# Patient Record
Sex: Female | Born: 1940 | Race: White | Hispanic: No | Marital: Married | Smoking: Never smoker
Health system: Southern US, Community
[De-identification: ages and names within clinical notes are randomized; demographics above are authoritative.]

## PROBLEM LIST (undated history)

## (undated) DIAGNOSIS — M1990 Unspecified osteoarthritis, unspecified site: Secondary | ICD-10-CM

## (undated) DIAGNOSIS — M797 Fibromyalgia: Secondary | ICD-10-CM

## (undated) DIAGNOSIS — Z923 Personal history of irradiation: Secondary | ICD-10-CM

## (undated) DIAGNOSIS — K589 Irritable bowel syndrome without diarrhea: Secondary | ICD-10-CM

## (undated) DIAGNOSIS — Z973 Presence of spectacles and contact lenses: Secondary | ICD-10-CM

## (undated) DIAGNOSIS — F419 Anxiety disorder, unspecified: Secondary | ICD-10-CM

## (undated) DIAGNOSIS — I82409 Acute embolism and thrombosis of unspecified deep veins of unspecified lower extremity: Secondary | ICD-10-CM

## (undated) DIAGNOSIS — C50411 Malignant neoplasm of upper-outer quadrant of right female breast: Secondary | ICD-10-CM

## (undated) DIAGNOSIS — I10 Essential (primary) hypertension: Secondary | ICD-10-CM

## (undated) DIAGNOSIS — E785 Hyperlipidemia, unspecified: Secondary | ICD-10-CM

## (undated) DIAGNOSIS — C50919 Malignant neoplasm of unspecified site of unspecified female breast: Secondary | ICD-10-CM

## (undated) DIAGNOSIS — M8580 Other specified disorders of bone density and structure, unspecified site: Secondary | ICD-10-CM

## (undated) HISTORY — DX: Irritable bowel syndrome, unspecified: K58.9

## (undated) HISTORY — PX: COLONOSCOPY: SHX174

## (undated) HISTORY — DX: Essential (primary) hypertension: I10

## (undated) HISTORY — DX: Malignant neoplasm of upper-outer quadrant of right female breast: C50.411

## (undated) HISTORY — PX: APPENDECTOMY: SHX54

## (undated) HISTORY — DX: Anxiety disorder, unspecified: F41.9

## (undated) HISTORY — PX: BREAST EXCISIONAL BIOPSY: SUR124

## (undated) HISTORY — DX: Other specified disorders of bone density and structure, unspecified site: M85.80

## (undated) HISTORY — DX: Fibromyalgia: M79.7

## (undated) HISTORY — PX: CATARACT EXTRACTION: SUR2

## (undated) HISTORY — DX: Unspecified osteoarthritis, unspecified site: M19.90

## (undated) HISTORY — DX: Hyperlipidemia, unspecified: E78.5

## (undated) HISTORY — DX: Acute embolism and thrombosis of unspecified deep veins of unspecified lower extremity: I82.409

---

## 1970-03-29 HISTORY — PX: ABDOMINAL HYSTERECTOMY: SHX81

## 1983-03-30 HISTORY — PX: OTHER SURGICAL HISTORY: SHX169

## 1993-03-29 HISTORY — PX: BREAST SURGERY: SHX581

## 1997-11-01 ENCOUNTER — Other Ambulatory Visit: Admission: RE | Admit: 1997-11-01 | Discharge: 1997-11-01 | Payer: Self-pay | Admitting: Obstetrics and Gynecology

## 1998-11-07 ENCOUNTER — Other Ambulatory Visit: Admission: RE | Admit: 1998-11-07 | Discharge: 1998-11-07 | Payer: Self-pay | Admitting: Obstetrics and Gynecology

## 1999-08-18 ENCOUNTER — Encounter: Admission: RE | Admit: 1999-08-18 | Discharge: 1999-08-18 | Payer: Self-pay | Admitting: General Surgery

## 1999-08-18 ENCOUNTER — Encounter: Payer: Self-pay | Admitting: General Surgery

## 1999-12-28 ENCOUNTER — Other Ambulatory Visit: Admission: RE | Admit: 1999-12-28 | Discharge: 1999-12-28 | Payer: Self-pay | Admitting: Family Medicine

## 2000-08-23 ENCOUNTER — Encounter: Admission: RE | Admit: 2000-08-23 | Discharge: 2000-08-23 | Payer: Self-pay | Admitting: General Surgery

## 2000-08-23 ENCOUNTER — Encounter: Payer: Self-pay | Admitting: General Surgery

## 2001-10-17 ENCOUNTER — Encounter: Payer: Self-pay | Admitting: Family Medicine

## 2001-10-17 ENCOUNTER — Encounter: Admission: RE | Admit: 2001-10-17 | Discharge: 2001-10-17 | Payer: Self-pay | Admitting: Family Medicine

## 2002-03-12 ENCOUNTER — Encounter: Admission: RE | Admit: 2002-03-12 | Discharge: 2002-03-12 | Payer: Self-pay | Admitting: Family Medicine

## 2002-03-12 ENCOUNTER — Encounter: Payer: Self-pay | Admitting: Family Medicine

## 2002-10-22 ENCOUNTER — Encounter: Payer: Self-pay | Admitting: Family Medicine

## 2002-10-22 ENCOUNTER — Encounter: Admission: RE | Admit: 2002-10-22 | Discharge: 2002-10-22 | Payer: Self-pay | Admitting: Family Medicine

## 2003-03-27 ENCOUNTER — Encounter: Admission: RE | Admit: 2003-03-27 | Discharge: 2003-03-27 | Payer: Self-pay | Admitting: Family Medicine

## 2003-03-30 HISTORY — PX: CHOLECYSTECTOMY: SHX55

## 2003-04-09 ENCOUNTER — Ambulatory Visit (HOSPITAL_COMMUNITY): Admission: RE | Admit: 2003-04-09 | Discharge: 2003-04-09 | Payer: Self-pay | Admitting: Gastroenterology

## 2003-04-26 ENCOUNTER — Ambulatory Visit (HOSPITAL_COMMUNITY): Admission: RE | Admit: 2003-04-26 | Discharge: 2003-04-27 | Payer: Self-pay | Admitting: Surgery

## 2003-04-26 ENCOUNTER — Encounter (INDEPENDENT_AMBULATORY_CARE_PROVIDER_SITE_OTHER): Payer: Self-pay | Admitting: *Deleted

## 2003-10-23 ENCOUNTER — Other Ambulatory Visit: Admission: RE | Admit: 2003-10-23 | Discharge: 2003-10-23 | Payer: Self-pay | Admitting: Family Medicine

## 2003-10-23 ENCOUNTER — Encounter: Payer: Self-pay | Admitting: Family Medicine

## 2003-11-01 ENCOUNTER — Encounter: Admission: RE | Admit: 2003-11-01 | Discharge: 2003-11-01 | Payer: Self-pay | Admitting: Family Medicine

## 2004-05-13 ENCOUNTER — Ambulatory Visit: Payer: Self-pay | Admitting: Family Medicine

## 2004-11-11 ENCOUNTER — Encounter: Admission: RE | Admit: 2004-11-11 | Discharge: 2004-11-11 | Payer: Self-pay | Admitting: Family Medicine

## 2005-03-23 ENCOUNTER — Ambulatory Visit: Payer: Self-pay | Admitting: Family Medicine

## 2005-09-15 ENCOUNTER — Ambulatory Visit: Payer: Self-pay | Admitting: Family Medicine

## 2005-10-05 ENCOUNTER — Ambulatory Visit: Payer: Self-pay | Admitting: Family Medicine

## 2005-10-15 ENCOUNTER — Ambulatory Visit: Payer: Self-pay | Admitting: Family Medicine

## 2005-11-15 ENCOUNTER — Encounter: Admission: RE | Admit: 2005-11-15 | Discharge: 2005-11-15 | Payer: Self-pay | Admitting: Family Medicine

## 2005-11-18 ENCOUNTER — Ambulatory Visit: Payer: Self-pay | Admitting: Family Medicine

## 2006-08-15 ENCOUNTER — Telehealth (INDEPENDENT_AMBULATORY_CARE_PROVIDER_SITE_OTHER): Payer: Self-pay | Admitting: *Deleted

## 2006-11-21 ENCOUNTER — Encounter: Admission: RE | Admit: 2006-11-21 | Discharge: 2006-11-21 | Payer: Self-pay | Admitting: Family Medicine

## 2006-11-23 ENCOUNTER — Encounter (INDEPENDENT_AMBULATORY_CARE_PROVIDER_SITE_OTHER): Payer: Self-pay | Admitting: *Deleted

## 2007-05-08 ENCOUNTER — Encounter: Payer: Self-pay | Admitting: Family Medicine

## 2007-05-08 DIAGNOSIS — M797 Fibromyalgia: Secondary | ICD-10-CM

## 2007-05-08 DIAGNOSIS — N39498 Other specified urinary incontinence: Secondary | ICD-10-CM

## 2007-05-08 DIAGNOSIS — Z8612 Personal history of poliomyelitis: Secondary | ICD-10-CM

## 2007-05-08 DIAGNOSIS — M19049 Primary osteoarthritis, unspecified hand: Secondary | ICD-10-CM | POA: Insufficient documentation

## 2007-05-11 ENCOUNTER — Ambulatory Visit: Payer: Self-pay | Admitting: Family Medicine

## 2007-05-11 DIAGNOSIS — M949 Disorder of cartilage, unspecified: Secondary | ICD-10-CM

## 2007-05-11 DIAGNOSIS — F411 Generalized anxiety disorder: Secondary | ICD-10-CM

## 2007-05-11 DIAGNOSIS — M899 Disorder of bone, unspecified: Secondary | ICD-10-CM | POA: Insufficient documentation

## 2007-05-11 DIAGNOSIS — E782 Mixed hyperlipidemia: Secondary | ICD-10-CM

## 2007-05-12 LAB — CONVERTED CEMR LAB
Albumin: 3.7 g/dL (ref 3.5–5.2)
CO2: 30 meq/L (ref 19–32)
Cholesterol: 228 mg/dL (ref 0–200)
Direct LDL: 111.1 mg/dL
GFR calc Af Amer: 58 mL/min
GFR calc non Af Amer: 48 mL/min
Glucose, Bld: 92 mg/dL (ref 70–99)
HDL: 94.9 mg/dL (ref >39.0)
Phosphorus: 3.6 mg/dL (ref 2.3–4.6)
Potassium: 4.5 meq/L (ref 3.5–5.1)
Sodium: 140 meq/L (ref 135–145)
TSH: 1.6 microintl units/mL (ref 0.35–5.50)
Total CHOL/HDL Ratio: 2.4
VLDL: 18 mg/dL (ref 0–40)

## 2007-05-15 ENCOUNTER — Encounter (INDEPENDENT_AMBULATORY_CARE_PROVIDER_SITE_OTHER): Payer: Self-pay | Admitting: *Deleted

## 2007-06-12 ENCOUNTER — Encounter: Payer: Self-pay | Admitting: Family Medicine

## 2007-06-12 LAB — HM COLONOSCOPY: HM Colonoscopy: NORMAL

## 2007-06-27 ENCOUNTER — Ambulatory Visit: Payer: Self-pay | Admitting: Family Medicine

## 2007-06-27 DIAGNOSIS — E559 Vitamin D deficiency, unspecified: Secondary | ICD-10-CM

## 2007-09-12 ENCOUNTER — Ambulatory Visit: Payer: Self-pay | Admitting: Family Medicine

## 2007-11-22 ENCOUNTER — Encounter: Admission: RE | Admit: 2007-11-22 | Discharge: 2007-11-22 | Payer: Self-pay | Admitting: Family Medicine

## 2008-02-20 ENCOUNTER — Ambulatory Visit: Payer: Self-pay | Admitting: Family Medicine

## 2008-03-12 ENCOUNTER — Ambulatory Visit: Payer: Self-pay | Admitting: Family Medicine

## 2008-04-10 ENCOUNTER — Telehealth: Payer: Self-pay | Admitting: Family Medicine

## 2008-04-15 ENCOUNTER — Ambulatory Visit: Payer: Self-pay | Admitting: Cardiology

## 2008-04-18 ENCOUNTER — Encounter: Payer: Self-pay | Admitting: Family Medicine

## 2008-08-19 ENCOUNTER — Ambulatory Visit: Payer: Self-pay | Admitting: Internal Medicine

## 2008-09-06 ENCOUNTER — Ambulatory Visit: Payer: Self-pay | Admitting: Family Medicine

## 2008-09-06 DIAGNOSIS — G43909 Migraine, unspecified, not intractable, without status migrainosus: Secondary | ICD-10-CM

## 2008-10-02 ENCOUNTER — Ambulatory Visit: Payer: Self-pay | Admitting: Family Medicine

## 2008-10-02 DIAGNOSIS — I10 Essential (primary) hypertension: Secondary | ICD-10-CM

## 2008-10-04 LAB — CONVERTED CEMR LAB
Alkaline Phosphatase: 61 units/L (ref 39–117)
BUN: 24 mg/dL — ABNORMAL HIGH (ref 6–23)
Basophils Absolute: 0 10*3/uL (ref 0.0–0.1)
CO2: 32 meq/L (ref 19–32)
Calcium: 9.6 mg/dL (ref 8.4–10.5)
Direct LDL: 142 mg/dL
Eosinophils Absolute: 0.1 10*3/uL (ref 0.0–0.7)
Hemoglobin: 12.9 g/dL (ref 12.0–15.0)
MCHC: 34.1 g/dL (ref 30.0–36.0)
Neutro Abs: 3.8 10*3/uL (ref 1.4–7.7)
Platelets: 229 10*3/uL (ref 150.0–400.0)
Potassium: 4.8 meq/L (ref 3.5–5.1)
RBC: 4.19 M/uL (ref 3.87–5.11)
Sodium: 142 meq/L (ref 135–145)
Total Bilirubin: 1.1 mg/dL (ref 0.3–1.2)
Total Protein: 6.5 g/dL (ref 6.0–8.3)
WBC: 7.1 10*3/uL (ref 4.5–10.5)

## 2008-11-22 ENCOUNTER — Encounter: Admission: RE | Admit: 2008-11-22 | Discharge: 2008-11-22 | Payer: Self-pay | Admitting: Family Medicine

## 2008-11-25 ENCOUNTER — Encounter (INDEPENDENT_AMBULATORY_CARE_PROVIDER_SITE_OTHER): Payer: Self-pay | Admitting: *Deleted

## 2009-01-10 ENCOUNTER — Ambulatory Visit: Payer: Self-pay | Admitting: Family Medicine

## 2009-01-10 ENCOUNTER — Encounter: Payer: Self-pay | Admitting: Family Medicine

## 2009-02-06 ENCOUNTER — Telehealth: Payer: Self-pay | Admitting: Family Medicine

## 2009-04-04 ENCOUNTER — Ambulatory Visit: Payer: Self-pay | Admitting: Family Medicine

## 2009-04-05 LAB — CONVERTED CEMR LAB
AST: 24 units/L (ref 0–37)
Direct LDL: 133.9 mg/dL
HDL: 106.4 mg/dL (ref >39.00)
VLDL: 8.8 mg/dL (ref 0.0–40.0)

## 2009-04-24 ENCOUNTER — Telehealth: Payer: Self-pay | Admitting: Family Medicine

## 2009-06-26 ENCOUNTER — Telehealth: Payer: Self-pay | Admitting: Family Medicine

## 2009-07-09 ENCOUNTER — Encounter: Payer: Self-pay | Admitting: Family Medicine

## 2009-07-09 ENCOUNTER — Telehealth: Payer: Self-pay | Admitting: Family Medicine

## 2009-08-20 ENCOUNTER — Ambulatory Visit: Payer: Self-pay | Admitting: Family Medicine

## 2009-09-10 ENCOUNTER — Telehealth: Payer: Self-pay | Admitting: Family Medicine

## 2009-09-15 ENCOUNTER — Telehealth: Payer: Self-pay | Admitting: Family Medicine

## 2009-10-07 ENCOUNTER — Ambulatory Visit: Payer: Self-pay | Admitting: Family Medicine

## 2009-10-08 LAB — CONVERTED CEMR LAB
ALT: 16 units/L (ref 0–35)
Albumin: 3.7 g/dL (ref 3.5–5.2)
Alkaline Phosphatase: 66 units/L (ref 39–117)
Basophils Absolute: 0.1 10*3/uL (ref 0.0–0.1)
CO2: 31 meq/L (ref 19–32)
Chloride: 108 meq/L (ref 96–112)
Cholesterol: 218 mg/dL — ABNORMAL HIGH (ref 0–200)
Direct LDL: 125.7 mg/dL
Eosinophils Absolute: 0.1 10*3/uL (ref 0.0–0.7)
GFR calc non Af Amer: 50.17 mL/min (ref >60)
MCV: 89.8 fL (ref 78.0–100.0)
Monocytes Relative: 7 % (ref 3.0–12.0)
Neutro Abs: 2.8 10*3/uL (ref 1.4–7.7)
Neutrophils Relative %: 53.4 % (ref 43.0–77.0)
Phosphorus: 3.3 mg/dL (ref 2.3–4.6)
Potassium: 4.9 meq/L (ref 3.5–5.1)
RBC: 4.37 M/uL (ref 3.87–5.11)
RDW: 14.6 % (ref 11.5–14.6)
Total Protein: 6.5 g/dL (ref 6.0–8.3)
VLDL: 17 mg/dL (ref 0.0–40.0)
Vit D, 25-Hydroxy: 34 ng/mL (ref 30–89)

## 2009-10-13 ENCOUNTER — Telehealth: Payer: Self-pay | Admitting: Family Medicine

## 2009-10-16 ENCOUNTER — Ambulatory Visit: Payer: Self-pay | Admitting: Family Medicine

## 2009-10-18 ENCOUNTER — Encounter: Payer: Self-pay | Admitting: Family Medicine

## 2009-11-03 ENCOUNTER — Encounter: Payer: Self-pay | Admitting: Family Medicine

## 2009-11-03 ENCOUNTER — Ambulatory Visit: Payer: Self-pay | Admitting: Internal Medicine

## 2009-11-10 ENCOUNTER — Telehealth: Payer: Self-pay | Admitting: Family Medicine

## 2009-11-24 ENCOUNTER — Ambulatory Visit: Payer: Self-pay | Admitting: Family Medicine

## 2009-11-24 DIAGNOSIS — K2990 Gastroduodenitis, unspecified, without bleeding: Secondary | ICD-10-CM

## 2009-11-24 DIAGNOSIS — K2970 Gastritis, unspecified, without bleeding: Secondary | ICD-10-CM

## 2009-11-28 ENCOUNTER — Encounter: Admission: RE | Admit: 2009-11-28 | Discharge: 2009-11-28 | Payer: Self-pay | Admitting: Family Medicine

## 2009-11-28 LAB — HM MAMMOGRAPHY: HM Mammogram: NORMAL

## 2009-12-02 ENCOUNTER — Encounter: Payer: Self-pay | Admitting: Family Medicine

## 2009-12-02 ENCOUNTER — Ambulatory Visit: Payer: Self-pay | Admitting: Internal Medicine

## 2009-12-23 ENCOUNTER — Telehealth: Payer: Self-pay | Admitting: Family Medicine

## 2010-01-06 ENCOUNTER — Ambulatory Visit: Payer: Self-pay | Admitting: Family Medicine

## 2010-01-15 ENCOUNTER — Encounter: Payer: Self-pay | Admitting: Family Medicine

## 2010-01-23 ENCOUNTER — Telehealth: Payer: Self-pay | Admitting: Family Medicine

## 2010-02-10 ENCOUNTER — Ambulatory Visit: Payer: Self-pay | Admitting: Family Medicine

## 2010-02-10 DIAGNOSIS — L259 Unspecified contact dermatitis, unspecified cause: Secondary | ICD-10-CM

## 2010-02-13 ENCOUNTER — Encounter: Payer: Self-pay | Admitting: Family Medicine

## 2010-02-17 ENCOUNTER — Encounter: Admission: RE | Admit: 2010-02-17 | Discharge: 2010-02-17 | Payer: Self-pay | Admitting: Gastroenterology

## 2010-02-27 ENCOUNTER — Telehealth: Payer: Self-pay | Admitting: Family Medicine

## 2010-03-16 ENCOUNTER — Encounter: Payer: Self-pay | Admitting: Family Medicine

## 2010-03-31 ENCOUNTER — Encounter: Payer: Self-pay | Admitting: Family Medicine

## 2010-04-13 ENCOUNTER — Telehealth: Payer: Self-pay | Admitting: Family Medicine

## 2010-04-16 ENCOUNTER — Encounter: Payer: Self-pay | Admitting: Family Medicine

## 2010-04-23 ENCOUNTER — Ambulatory Visit (HOSPITAL_COMMUNITY)
Admission: RE | Admit: 2010-04-23 | Discharge: 2010-04-23 | Payer: Self-pay | Source: Home / Self Care | Attending: Gastroenterology | Admitting: Gastroenterology

## 2010-04-28 NOTE — Progress Notes (Signed)
Summary: refill request for flexeril  Phone Note Refill Request Message from:  Fax from Pharmacy  Refills Requested: Medication #1:  cyclobenzaprine 10 mg   Last Refilled: 08/06/2009 Faxed request from [facility].  This is no longer on med list.  Initial call taken by: Lowella Petties CMA,  September 10, 2009 10:05 AM  Follow-up for Phone Call        px written on EMR for call in  Follow-up by: Judith Part MD,  September 10, 2009 11:24 AM  Additional Follow-up for Phone Call Additional follow up Details #1::        Medication phoned to [facility] pharmacy as instructed. Lewanda Rife LPN  September 10, 2009 1:09 PM     New/Updated Medications: FLEXERIL 10 MG TABS (CYCLOBENZAPRINE HCL) 1 by mouth at bedtime as needed Prescriptions: FLEXERIL 10 MG TABS (CYCLOBENZAPRINE HCL) 1 by mouth at bedtime as needed  #30 x 0   Entered and Authorized by:   Judith Part MD   Signed by:   Lewanda Rife LPN on 29/56/2130   Method used:   Telephoned to ...         RxID:   [id number]

## 2010-04-28 NOTE — Assessment & Plan Note (Signed)
Summary: HA SINCE SATURDAY/CLE   Vital Signs:  Patient profile:   70 year old female Height:      64.25 inches Weight:      145.50 pounds BMI:     24.87 Temp:     97.4 degrees F oral Pulse rate:   80 / minute Pulse rhythm:   regular BP sitting:   106 / 74  (left arm) Cuff size:   regular  Vitals Entered By: Lewanda Rife LPN (October 16, 2009 10:09 AM) CC: headache since Sat. 10/11/09   History of Present Illness: thinks she has a sinus infection - hurts all over her face - worse since sat congestion and post nasal drainage for a month  d/c is yellow  thinks she has had a fever -- chills this week face is tender  teeth even hurt and eyes   no coughing   is taking [facility] sinus pain [facility] congestion med   Allergies (verified): No Known Drug Allergies  Past History:  Past Medical History: Last updated: 08/20/2009 osteopenia anxiety hyperlipidemia vit D deficiency fibromyalgia OA hands  migraine   used to see Dr Phylliss Bob for rheum (he passed away)   Past Surgical History: Last updated: 06/19/2007 Hysterectomy (1972) Ovarian cyst removed (1985) Lumpectomy- fibrocystic breast 78295) Colonoscopy- hemorrhoids (1998) Dexa- borderline (05/201) Hepatobil scan- decreased GB EF (03/2003) Cholecystectomy (04/2003) 3/09 colonoscopy neg with hemorrhoids  Family History: Last updated: 10/02/2008 Father: HTN, cancer of stomach and intestines- died age 50 Mother: CLL, alzheimers- died age 9 Siblings: brother- breast cancer GM with colon cancer neice with aplastic anemia -- bone marrow transplant  Social History: Last updated: 04/04/2009 Marital Status: Married (husb has CAD) Children: 2 daughters Occupation: owns trucking business Never Smoked Alcohol use-no  Risk Factors: Smoking Status: never (05/11/2007)  Review of Systems General:  Denies chills, fatigue, and malaise. Eyes:  Complains of eye irritation; denies blurring and discharge. ENT:  Complains of nasal  congestion, postnasal drainage, sinus pressure, and sore throat. CV:  Denies chest pain or discomfort and palpitations. Resp:  Denies cough, shortness of breath, and wheezing. GI:  Denies nausea and vomiting. Derm:  Denies rash. Neuro:  Complains of headaches.  Physical Exam  General:  fatigued but well appearing Head:  normocephalic, atraumatic, and no abnormalities observed.  bilat eth and maxillary sinuses which ismarked  Eyes:  vision grossly intact, pupils equal, pupils round, pupils reactive to light, and no injection.   Ears:  R ear normal and L ear normal.   Nose:  nares are injected and congested bilaterally  Mouth:  pharynx pink and moist, no erythema, and no exudates.   Neck:  supple with full rom and no masses or thyromegally, no JVD or carotid bruit  Lungs:  Normal respiratory effort, chest expands symmetrically. Lungs are clear to auscultation, no crackles or wheezes. Heart:  Normal rate and regular rhythm. S1 and S2 normal without gallop, murmur, click, rub or other extra sounds. Skin:  Intact without suspicious lesions or rashes Cervical Nodes:  No lymphadenopathy noted Psych:  normal affect, talkative and pleasant    Impression & Recommendations:  Problem # 1:  SINUSITIS - ACUTE-NOS (ICD-461.9) Assessment New  with facial pain and purulent nasal drainage will cover with augmentin  recommend sympt care- see pt instructions   pt advised to update me if symptoms worsen or do not improve  Her updated medication list for this problem includes:    Fluticasone Propionate 50 Mcg/act Susp (Fluticasone propionate) ..... One spray each  nostril daily as needed    Augmentin 875-125 Mg Tabs (Amoxicillin-pot clavulanate) .Marland Kitchen... 1 by mouth two times a day for 10 days for sinus infection  Orders: Prescription Created Electronically [phone])  Complete Medication List: 1)  Calcium 600 Mg Tabs (Calcium) .... Take one by mouth bid 2)  Vitamin D 1000 Unit Tabs (Cholecalciferol) ....  One by mouth daily 3)  Imitrex 50 Mg Tabs (Sumatriptan succinate) .Marland Kitchen.. 1 by mouth times one at onset of headache (maximum one pill in a day) 4)  Xanax 0.5 Mg Tabs (Alprazolam) .Marland Kitchen.. 1 by mouth up to two times a day as needed severe anxiety 5)  Fish Oil 1200 Mg Caps (Omega-3 fatty acids) .... Take one by mouth daily 6)  Flexeril 10 Mg Tabs (Cyclobenzaprine hcl) .Marland Kitchen.. 1 by mouth at bedtime as needed 7)  Fluticasone Propionate 50 Mcg/act Susp (Fluticasone propionate) .... One spray each nostril daily as needed 8)  Lidoderm 5 % Ptch (Lidocaine) .... Apply as needed 9)  Vicodin 5-500 Mg Tabs (Hydrocodone-acetaminophen) .Marland Kitchen.. 1 by mouth up to every 4-6 hours as needed for pain 10)  [facility] and Congestion  .... Otc as directed. 11)  Augmentin 875-125 Mg Tabs (Amoxicillin-pot clavulanate) .Marland Kitchen.. 1 by mouth two times a day for 10 days for sinus infection  Patient Instructions: 1)  you can try mucinex over the counter twice daily as directed and nasal saline spray for congestion 2)  tylenol over the counter as directed may help with aches, headache and fever 3)  call if symptoms worsen or if not improved in 4-5 days  4)  take the augmentin with food as directed  Prescriptions: AUGMENTIN 875-125 MG TABS (AMOXICILLIN-POT CLAVULANATE) 1 by mouth two times a day for 10 days for sinus infection  #20 x 0   Entered and Authorized by:   Judith Part MD   Signed by:   Judith Part MD on 10/16/2009   Method used:   Electronically to        [facility]* (retail)       [address]       [place], [place]  [address]       Ph: [phone]       Fax: [phone]   RxID:   [id number]   Current Allergies (reviewed today): No known allergies

## 2010-04-28 NOTE — Letter (Signed)
Summary: Results Follow up Letter  [place] [facility]  [address] [place], [place] [address]   Phone: [phone]  Fax: [phone]    12/02/2009 MRN: [id number]    [facility] [address] [place], [place]  [address]    Dear Ms. Dibartolo,  The following are the results of your recent test(s):  Test         Result    Pap Smear:        Normal _____  Not Normal _____ Comments: ______________________________________________________ Cholesterol: LDL(Bad cholesterol):         Your goal is less than:         HDL (Good cholesterol):       Your goal is more than: Comments:  ______________________________________________________ Mammogram:        Normal __X__  Not Normal _____ Comments:Repeat in one year.   ___________________________________________________________________ Hemoccult:        Normal _____  Not normal _______ Comments:    _____________________________________________________________________ Other Tests:    We routinely do not discuss normal results over the telephone.  If you desire a copy of the results, or you have any questions about this information we can discuss them at your next office visit.   Sincerely,    Idamae Schuller Tower,MD  MT/ri

## 2010-04-28 NOTE — Progress Notes (Signed)
Summary: refill request for xanax  Phone Note Refill Request Message from:  Fax from Pharmacy  Refills Requested: Medication #1:  XANAX 0.5 MG TABS 1 by mouth up to two times a day as needed severe anxiety   Last Refilled: 07/09/2009 Faxed request from [facility].   Initial call taken by: Lowella Petties CMA,  September 15, 2009 10:41 AM  Follow-up for Phone Call        Rx call to [facility]. Follow-up by: Linde Gillis CMA Duncan Dull),  September 16, 2009 9:51 AM    Prescriptions: XANAX 0.5 MG TABS (ALPRAZOLAM) 1 by mouth up to two times a day as needed severe anxiety  #30 x 0   Entered by:   Ruthe Mannan MD   Authorized by:   Judith Part MD   Signed by:   Ruthe Mannan MD on 09/16/2009   Method used:   Telephoned to ...         RxID:   [id number]

## 2010-04-28 NOTE — Progress Notes (Signed)
Summary: Rx Alprazolam  Phone Note Refill Request Call back at [phone] Message from:  [facility] on July 09, 2009 9:20 AM  Refills Requested: Medication #1:  XANAX 0.5 MG TABS 1 by mouth up to two times a day as needed severe anxiety   Last Refilled: 04/24/2009 Received faxed refill request please advise.   Method Requested: Telephone to Pharmacy Initial call taken by: Linde Gillis CMA Duncan Dull),  July 09, 2009 9:21 AM  Follow-up for Phone Call        px written on EMR for call in  Follow-up by: Judith Part MD,  July 09, 2009 10:27 AM  Additional Follow-up for Phone Call Additional follow up Details #1::        Medication phoned to [facility] pharmacy as instructed. Lewanda Rife LPN  July 09, 2009 2:36 PM     New/Updated Medications: XANAX 0.5 MG TABS (ALPRAZOLAM) 1 by mouth up to two times a day as needed severe anxiety Prescriptions: XANAX 0.5 MG TABS (ALPRAZOLAM) 1 by mouth up to two times a day as needed severe anxiety  #30 x 0   Entered and Authorized by:   Judith Part MD   Signed by:   Lewanda Rife LPN on 45/40/9811   Method used:   Telephoned to ...         RxID:   [id number]

## 2010-04-28 NOTE — Progress Notes (Signed)
Summary: Request rx for Zostavax  Phone Note Call from Patient Call back at [phone]   Caller: Patient Call For: Judith Part MD Summary of Call: Patient has checked with her insurance company about the shingles shot. Patient would like an rx written for this vaccine for her to pick up and take to [facility]. Call when ready for pickup. Initial call taken by: Sydell Axon LPN,  December 23, 2009 10:37 AM  Follow-up for Phone Call        printed in put in nurse in box for pickup  Follow-up by: Judith Part MD,  December 23, 2009 11:13 AM  Additional Follow-up for Phone Call Additional follow up Details #1::        Patient notified as instructed by telephone. Prescription left at front desk. Lewanda Rife LPN  December 23, 2009 2:08 PM     New/Updated Medications: ZOSTAVAX [address] UNT/0.65ML SOLR (ZOSTER VACCINE LIVE) inject times one as directed Prescriptions: ZOSTAVAX [address]454 UNT/0.65ML SOLR (ZOSTER VACCINE LIVE) inject times one as directed  #1 x 0   Entered and Authorized by:   Judith Part MD   Signed by:   Judith Part MD on 12/23/2009   Method used:   Print then Give to Patient   RxID:   [id number]

## 2010-04-28 NOTE — Miscellaneous (Signed)
Summary: ZOSTAVAX  Clinical Lists Changes  Observations: Added new observation of ZOSTAVAX: lot [id number] exp 3/12 [facility] [place] (12/23/2009 16:28)

## 2010-04-28 NOTE — Progress Notes (Signed)
Summary: refill request for xanax  Phone Note Refill Request Message from:  Fax from Pharmacy  Refills Requested: Medication #1:  XANAX 0.5 MG TABS 1 by mouth up to two times a day as needed severe anxiety   Last Refilled: 09/06/2008 Faxed request from [facility], [phone].  Initial call taken by: Lowella Petties CMA,  April 24, 2009 8:58 AM  Follow-up for Phone Call        px written on EMR for call in  Follow-up by: Judith Part MD,  April 24, 2009 9:01 AM  Additional Follow-up for Phone Call Additional follow up Details #1::        Called to [facility]. Additional Follow-up by: Lowella Petties CMA,  April 24, 2009 9:25 AM    Prescriptions: XANAX 0.5 MG TABS (ALPRAZOLAM) 1 by mouth up to two times a day as needed severe anxiety  #30 x 0   Entered and Authorized by:   Judith Part MD   Signed by:   Judith Part MD on 04/24/2009   Method used:   Telephoned to ...         RxID:   [id number]

## 2010-04-28 NOTE — Miscellaneous (Signed)
Summary: Naproxen 500mg  rx  Clinical Lists Changes  Medications: Added new medication of NAPROSYN 500 MG TABS (NAPROXEN) take one tablet by mouth two times a day with food as needed     Current Allergies: No known allergies

## 2010-04-28 NOTE — Assessment & Plan Note (Signed)
Summary: COLD,THROAT/CLE   Vital Signs:  Patient profile:   70 year old female Height:      64.25 inches Weight:      148.50 pounds BMI:     25.38 O2 Sat:      99 % on Room air Temp:     97.6 degrees F oral Pulse rate:   76 / minute Pulse rhythm:   regular Resp:     20 per minute BP sitting:   118 / 68  (left arm) Cuff size:   regular  Vitals Entered By: Lewanda Rife LPN (Aug 20, 2009 2:19 PM)  O2 Flow:  Room air CC: productive cough with green mucus, raw throat, does not feel like getting air in lungs   History of Present Illness: has been sick for over a week cold symptoms with runny nose  then sore throat and hoarseness  got a little better with head congestion- then much worse in chest  lot of green /yellow mucous from chest  is tight - ? wheeze  chest irritated and sore   ? if fever- chills at the beginning   no n/vd  tried some mucinex D   Allergies (verified): No Known Drug Allergies  Past History:  Past Surgical History: Last updated: 06/19/2007 Hysterectomy (1972) Ovarian cyst removed (1985) Lumpectomy- fibrocystic breast 11914) Colonoscopy- hemorrhoids (1998) Dexa- borderline (05/201) Hepatobil scan- decreased GB EF (03/2003) Cholecystectomy (04/2003) 3/09 colonoscopy neg with hemorrhoids  Family History: Last updated: 10/02/2008 Father: HTN, cancer of stomach and intestines- died age 74 Mother: CLL, alzheimers- died age 72 Siblings: brother- breast cancer GM with colon cancer neice with aplastic anemia -- bone marrow transplant  Social History: Last updated: 04/04/2009 Marital Status: Married (husb has CAD) Children: 2 daughters Occupation: owns trucking business Never Smoked Alcohol use-no  Risk Factors: Smoking Status: never (05/11/2007)  Past Medical History: osteopenia anxiety hyperlipidemia vit D deficiency fibromyalgia OA hands  migraine   used to see Dr Phylliss Bob for rheum (he passed away)   Review of Systems General:   Complains of chills and malaise; denies fatigue, fever, and loss of appetite. Eyes:  Denies blurring and eye pain. ENT:  Complains of postnasal drainage and sore throat; denies earache and sinus pressure. CV:  Denies chest pain or discomfort and palpitations. Resp:  Complains of cough, pleuritic, and sputum productive; denies wheezing. Derm:  Denies itching, lesion(s), and rash. Heme:  Denies abnormal bruising and bleeding.  Physical Exam  General:  Well-developed,well-nourished,in no acute distress; alert,appropriate and cooperative throughout examination Head:  normocephalic, atraumatic, and no abnormalities observed.  no sinus tenderness  Eyes:  vision grossly intact, pupils equal, pupils round, pupils reactive to light, pupils react to accomodation, corneas and lenses clear, and no injection.   Ears:  R ear normal and L ear normal.   Nose:  nares are mildly injected and congested  Mouth:  pharynx pink and moist, no erythema, and no exudates.   Neck:  supple with full rom and no masses or thyromegally, no JVD or carotid bruit  Lungs:  CTA with harsh bs and occ rhonchi at the bases no rales or wheeze no sob  Heart:  Normal rate and regular rhythm. S1 and S2 normal without gallop, murmur, click, rub or other extra sounds. Skin:  Intact without suspicious lesions or rashes Cervical Nodes:  No lymphadenopathy noted Psych:  normal affect, talkative and pleasant    Impression & Recommendations:  Problem # 1:  BRONCHITIS- ACUTE (ICD-466.0) Assessment New with rhonchi  on exam and increasing prod cough will cover with zithromax and update  recommend sympt care- see pt instructions  -- guifen- codiene for cough with caution pt advised to update me if symptoms worsen or do not improve - esp if sob or wheeze The following medications were removed from the medication list:    Mucinex D [phone] Mg Xr12h-tab (Pseudoephedrine-guaifenesin) ..... Otc as directed. Her updated medication list for  this problem includes:    Zithromax Z-pak 250 Mg Tabs (Azithromycin) .Marland Kitchen... Take by mouth as directed    Guaifenesin-codeine 100-10 Mg/108ml Soln (Guaifenesin-codeine) .Marland Kitchen... 1-2 teaspoons by mouth up to every 4-6 hours as needed cough  Problem # 2:  FIBROMYALGIA (ICD-729.1) Assessment: Unchanged I will take over her care as her rheumatologist (Dr Phylliss Bob) passed away not really on many meds - has been stable Her updated medication list for this problem includes:    Naprosyn 500 Mg Tabs (Naproxen) .Marland Kitchen... Take one tablet by mouth two times a day with food as needed  Complete Medication List: 1)  Calcium 600 Mg Tabs (Calcium) .... Take one by mouth bid 2)  Vitamin D 1000 Unit Tabs (Cholecalciferol) .... One by mouth daily 3)  Imitrex 50 Mg Tabs (Sumatriptan succinate) .Marland Kitchen.. 1 by mouth times one at onset of headache (maximum one pill in a day) 4)  Xanax 0.5 Mg Tabs (Alprazolam) .Marland Kitchen.. 1 by mouth up to two times a day as needed severe anxiety 5)  Fish Oil 1200 Mg Caps (Omega-3 fatty acids) .... Take one by mouth daily 6)  Naprosyn 500 Mg Tabs (Naproxen) .... Take one tablet by mouth two times a day with food as needed 7)  Zicam  .... Otc as directed. 8)  Zithromax Z-pak 250 Mg Tabs (Azithromycin) .... Take by mouth as directed 9)  Guaifenesin-codeine 100-10 Mg/15ml Soln (Guaifenesin-codeine) .Marland Kitchen.. 1-2 teaspoons by mouth up to every 4-6 hours as needed cough  Patient Instructions: 1)  drink lots of water and get rest  2)  try the guifen- codiene cough syrup with caution because of sedation  3)  take the zithromax as directed  4)  tylenol ifneeded for fever 5)  update me if worse or wheezing or not improved next week  Prescriptions: GUAIFENESIN-CODEINE 100-10 MG/5ML SOLN (GUAIFENESIN-CODEINE) 1-2 teaspoons by mouth up to every 4-6 hours as needed cough  #120cc x 0   Entered and Authorized by:   Judith Part MD   Signed by:   Judith Part MD on 08/20/2009   Method used:   Print then Give to  Patient   RxID:   [id number] ZITHROMAX Z-PAK 250 MG TABS (AZITHROMYCIN) take by mouth as directed  #1 pack x 0   Entered and Authorized by:   Judith Part MD   Signed by:   Judith Part MD on 08/20/2009   Method used:   Print then Give to Patient   RxID:   [id number]   Current Allergies (reviewed today): No known allergies

## 2010-04-28 NOTE — Letter (Signed)
Summary: [facility] Triage Call Report  [facility] Triage Call Report   Imported By: Beau Fanny 10/21/2009 16:35:37  _____________________________________________________________________  External Attachment:    Type:   Image     Comment:   External Document

## 2010-04-28 NOTE — Assessment & Plan Note (Signed)
Summary: DISCUSS BONE DENSITY/RI   Vital Signs:  Patient profile:   70 year old female Height:      64.25 inches Weight:      147 pounds BMI:     25.13 Temp:     97.8 degrees F oral Pulse rate:   84 / minute Pulse rhythm:   regular BP sitting:   116 / 68  (left arm) Cuff size:   regular  Vitals Entered By: Lewanda Rife LPN (November 24, 2009 9:02 AM) CC: discuss bone density results   History of Present Illness: here for f/u of dexa   shows osteopenia with FN T score -2.0 and LS -2.2  was close to borderline? in the past   has broken bone after fall down stairs in past - very hard fall  not a fragility fracture   mother had OP  is taking ca and vit D  has never been on med   she is currently having a lot of heartburn  wakes up at 3 am and stomach feels like it is on fire -- going on for some time  has a lot of sinus drainage  does take naproxen -- not every day  is taking zantac -- otc helps but does not resolve it  tried omeprazole -- and that worked a lot better      Allergies (verified): 1)  ! Augmentin (Amoxicillin-Pot Clavulanate) 2)  ! Amoxicillin  Past History:  Family History: Last updated: 10/02/2008 Father: HTN, cancer of stomach and intestines- died age 31 Mother: CLL, alzheimers- died age 66 Siblings: brother- breast cancer GM with colon cancer neice with aplastic anemia -- bone marrow transplant  Social History: Last updated: 04/04/2009 Marital Status: Married (husb has CAD) Children: 2 daughters Occupation: owns trucking business Never Smoked Alcohol use-no  Risk Factors: Smoking Status: never (05/11/2007)  Past Medical History: osteopenia anxiety hyperlipidemia vit D deficiency fibromyalgia OA hands  migraine DVT in 1980s   used to see Dr Phylliss Bob for rheum (he passed away)   Past Surgical History: Hysterectomy (1972) Ovarian cyst removed (1985) Lumpectomy- fibrocystic breast 24401) Colonoscopy- hemorrhoids (1998) Dexa-  borderline (05/201) Hepatobil scan- decreased GB EF (03/2003) Cholecystectomy (04/2003) 3/09 colonoscopy neg with hemorrhoids DVT 1980s   Review of Systems General:  Denies fatigue, loss of appetite, and malaise. Eyes:  Denies blurring and eye irritation. CV:  Denies chest pain or discomfort and palpitations. Resp:  Denies cough and wheezing. GI:  Complains of indigestion; denies abdominal pain, change in bowel habits, loss of appetite, nausea, and vomiting. GU:  Denies dysuria and nocturia. MS:  Denies joint pain and cramps. Derm:  Denies itching, lesion(s), poor wound healing, and rash. Neuro:  Denies numbness and tingling. Psych:  Denies anxiety and depression. Endo:  Denies cold intolerance, excessive thirst, excessive urination, and heat intolerance. Heme:  Denies abnormal bruising and bleeding.  Physical Exam  General:  Well-developed,well-nourished,in no acute distress; alert,appropriate and cooperative throughout examination Head:  normocephalic, atraumatic, and no abnormalities observed.   Eyes:  vision grossly intact, pupils equal, pupils round, and pupils reactive to light.  no conjunctival pallor, injection or icterus  Mouth:  pharynx pink and moist.   Neck:  supple with full rom and no masses or thyromegally, no JVD or carotid bruit  Chest Wall:  No deformities, masses, or tenderness noted. Lungs:  Normal respiratory effort, chest expands symmetrically. Lungs are clear to auscultation, no crackles or wheezes. Heart:  Normal rate and regular rhythm. S1 and S2 normal  without gallop, murmur, click, rub or other extra sounds. Abdomen:  epigastric tenderness  soft, normal bowel sounds, no guarding, no rigidity, no rebound tenderness, no hepatomegaly, and no splenomegaly.   Msk:  no kyphosis  no acute joint changes  Pulses:  R and L carotid,radial,femoral,dorsalis pedis and posterior tibial pulses are full and equal bilaterally Extremities:  No clubbing, cyanosis, edema, or  deformity noted with normal full range of motion of all joints.   Neurologic:  sensation intact to light touch, gait normal, and DTRs symmetrical and normal.   Skin:  Intact without suspicious lesions or rashes Cervical Nodes:  No lymphadenopathy noted Inguinal Nodes:  No significant adenopathy Psych:  normal affect, talkative and pleasant    Impression & Recommendations:  Problem # 1:  OSTEOPENIA (ICD-733.90) Assessment Deteriorated disc tx opt cannot try bisphosphenate yet due to gerd / gastritis cannot try evista due to dvt in past will start with miacalcin and then re eval later (? candidate for reclast in future) rev ca and vit D Her updated medication list for this problem includes:    Calcium 600 Mg Tabs (Calcium) .Marland Kitchen... Take one by mouth twice a day    Vitamin D 1000 Unit Tabs (Cholecalciferol) ..... One by mouth daily    Miacalcin 200 Unit/ml Soln (Calcitonin (salmon)) .Marland Kitchen... 1 spray in one nostril daily- alternating  nostrils  Problem # 2:  GASTRITIS (ICD-535.50) Assessment: New in pt on nsaid and with stress stop naids alltogether  start omeprazole since zantac was not eff enough fu 6 wk  update if worse Her updated medication list for this problem includes:    Omeprazole 20 Mg Cpdr (Omeprazole) .Marland Kitchen... 1 by mouth once daily in am  Complete Medication List: 1)  Calcium 600 Mg Tabs (Calcium) .... Take one by mouth twice a day 2)  Vitamin D 1000 Unit Tabs (Cholecalciferol) .... One by mouth daily 3)  Imitrex 50 Mg Tabs (Sumatriptan succinate) .Marland Kitchen.. 1 by mouth times one at onset of headache (maximum one pill in a day) 4)  Xanax 0.5 Mg Tabs (Alprazolam) .Marland Kitchen.. 1 by mouth up to two times a day as needed severe anxiety 5)  Fish Oil 1200 Mg Caps (Omega-3 fatty acids) .... Take one by mouth daily 6)  Flexeril 10 Mg Tabs (Cyclobenzaprine hcl) .Marland Kitchen.. 1 by mouth at bedtime as needed 7)  Fluticasone Propionate 50 Mcg/act Susp (Fluticasone propionate) .... One spray each nostril daily as  needed 8)  Lidoderm 5 % Ptch (Lidocaine) .... Apply as needed 9)  Vicodin 5-500 Mg Tabs (Hydrocodone-acetaminophen) .Marland Kitchen.. 1 by mouth up to every 4-6 hours as needed for pain 10)  Cvs Sinius and Congestion  .... Otc as directed. 11)  Miacalcin 200 Unit/ml Soln (Calcitonin (salmon)) .Marland Kitchen.. 1 spray in one nostril daily- alternating  nostrils 12)  Omeprazole 20 Mg Cpdr (Omeprazole) .Marland Kitchen.. 1 by mouth once daily in am  Patient Instructions: 1)  start omeprazole daily in am  2)  stop naproxen or any other anti inflammatories 3)  start miacalcin nasal spray for your bones  4)  the current recommendation for calcium intake is 1200-1500 mg daily with 1000 IU of vitamin D  5)  check with your insurance and then call to schedule shingles vaccine  6)  follow up with me in 6 weeks  Prescriptions: OMEPRAZOLE 20 MG CPDR (OMEPRAZOLE) 1 by mouth once daily in am  #30 x 11   Entered and Authorized by:   Judith Part MD   Signed  by:   Judith Part MD on 11/24/2009   Method used:   Electronically to        [facility]* (retail)       [address]       [place], [place]  [address]       Ph: [phone]       Fax: [phone]   RxID:   [id number] MIACALCIN 200 UNIT/ML SOLN (CALCITONIN (SALMON)) 1 spray in one nostril daily- alternating  nostrils  #2 mdi x 11   Entered and Authorized by:   Judith Part MD   Signed by:   Judith Part MD on 11/24/2009   Method used:   Electronically to        [facility]* (retail)       [address]       [place], [place]  [address]       Ph: [phone]       Fax: [phone]   RxID:   [id number]   Current Allergies (reviewed today): ! AUGMENTIN (AMOXICILLIN-POT CLAVULANATE) ! AMOXICILLIN

## 2010-04-28 NOTE — Miscellaneous (Signed)
Summary: Bone Density  Clinical Lists Changes  Orders: Added new Test order of T-Bone Densitometry (77080) - Signed Added new Test order of T-Lumbar Vertebral Assessment (77082) - Signed 

## 2010-04-28 NOTE — Miscellaneous (Signed)
Summary: Controlled Substances Contract  Controlled Substances Contract   Imported By: Lester Sherburn 10/09/2009 11:55:48  _____________________________________________________________________  External Attachment:    Type:   Image     Comment:   External Document

## 2010-04-28 NOTE — Progress Notes (Signed)
Summary: nexium is not working  Phone Note Call from Patient Call back at [employer] [phone] Call back at [phone]   Caller: Patient Call For: Sarah Young Summary of Call: Pt states the nexium is not working for her,she has been taking this for about 3 weeks now.  Still has burning at night, stays nauseated all the time.  She wants to try something else,has tried and failed with omeprazole.  [facility]ses [facility].  Initial call taken by: Lowella Petties CMA, AAMA,  January 23, 2010 3:12 PM  Follow-up for Phone Call        have her please call ins co and find out what proton pump inhibitors are affordible (have her tell them that omeprazole and nexium do not work)- let me know Follow-up by: Sarah Young,  January 25, 2010 3:50 PM  Additional Follow-up for Phone Call Additional follow up Details #1::        Patient notified as instructed by telephone. Pt will call back with info. Lewanda Rife LPN  January 26, 2010 8:29 AM   Pt called back and told Jacki Cones the proton pump inhibitor covered is  Medical laboratory scientific officer.Lewanda Rife LPN  January 26, 2010 10:08 AM     Additional Follow-up for Phone Call Additional follow up Details #2::    px written on EMR for call in  please try this and update me with how it goes Follow-up by: Sarah Young,  January 26, 2010 11:45 PM  New/Updated Medications: PREVACID 30 MG CPDR (LANSOPRAZOLE) 1 by mouth once daily Prescriptions: PREVACID 30 MG CPDR (LANSOPRAZOLE) 1 by mouth once daily  #30 x 11   Entered and Authorized by:   Sarah Young   Signed by:   Sarah Young on 01/26/2010   Method used:   Telephoned to ...       [facility]* (retail)       [address]       [place], [place]  [address]       Ph: [phone]       Fax: [phone]   RxID:   [id number]

## 2010-04-28 NOTE — Assessment & Plan Note (Signed)
Summary: flu injection    kik  Nurse Visit   Vital Signs:  Patient profile:   70 year old female Temp:     97.8 degrees F oral  Vitals Entered By: Duard Brady LPN (December 02, 2009 11:56 AM) Flu Vaccine Consent Questions     Do you have a history of severe allergic reactions to this vaccine? no    Any prior history of allergic reactions to egg and/or gelatin? no    Do you have a sensitivity to the preservative Thimersol? no    Do you have a past history of Guillan-Barre Syndrome? no    Do you currently have an acute febrile illness? no    Have you ever had a severe reaction to latex? no    Vaccine information given and explained to patient? yes    Are you currently pregnant? no    Lot Number:[id number]   Exp Date:09/26/2010   Site Given  Left Deltoid IM  Allergies: 1)  ! Augmentin (Amoxicillin-Pot Clavulanate) 2)  ! Amoxicillin  Orders Added: 1)  Flu Vaccine 34yrs + MEDICARE PATIENTS [Q2039] 2)  Administration Flu vaccine - MCR [G0008]

## 2010-04-28 NOTE — Consult Note (Signed)
Summary: [facility]  [facility]   Imported By: Maryln Gottron 02/23/2010 13:53:06  _____________________________________________________________________  External Attachment:    Type:   Image     Comment:   External Document

## 2010-04-28 NOTE — Progress Notes (Signed)
Summary: flexeril   Phone Note Refill Request Message from:  Fax from Pharmacy on February 27, 2010 11:34 AM  Refills Requested: Medication #1:  FLEXERIL 10 MG TABS 1 by mouth at bedtime as needed   Last Refilled: 01/27/2010 Refill request from [facility] [phone].    Initial call taken by: Melody Comas,  February 27, 2010 11:35 AM  Follow-up for Phone Call        px written on EMR for call in  Follow-up by: Judith Part MD,  February 27, 2010 11:42 AM  Additional Follow-up for Phone Call Additional follow up Details #1::        Rx called in as directed Additional Follow-up by: Janee Morn CMA Duncan Dull),  February 27, 2010 4:29 PM    Prescriptions: FLEXERIL 10 MG TABS (CYCLOBENZAPRINE HCL) 1 by mouth at bedtime as needed  #30 x 5   Entered and Authorized by:   Judith Part MD   Signed by:   Selena Batten Dance CMA (AAMA) on 02/27/2010   Method used:   Telephoned to ...       [facility]* (retail)       [address]       [place], [place]  [address]       Ph: [phone]       Fax: [phone]   RxID:   [id number]

## 2010-04-28 NOTE — Assessment & Plan Note (Signed)
Summary: ROA FOR 6 WEEK FOLLOW-UP/JRR   Vital Signs:  Patient profile:   70 year old female Height:      64.25 inches Weight:      145.50 pounds BMI:     24.87 Temp:     98.2 degrees F oral Pulse rate:   80 / minute Pulse rhythm:   regular BP sitting:   100 / 64  (left arm) Cuff size:   regular  Vitals Entered By: Lewanda Rife LPN (February 10, 2010 9:19 AM) CC: six week f/u   History of Present Illness: here for f/u of gastritis / gerd  changed to prevacid after failure of omeprazole and nexium to resolve symptoms   is quite a bit improved on prevacid  nexium made her nauseated  nausea is better  pain is about 60 percent improved - epigastric  really watches what she eats - bland food like potato and oatmeal   wt is the same   pain is still left to middle  at times some loose stool -- occas no black stool or blood   no fever   also L 4th finger -- is red for 1 mo after gardening  itches like crazy  no hx of psoriasis     Allergies: 1)  ! Augmentin (Amoxicillin-Pot Clavulanate) 2)  ! Amoxicillin 3)  ! Nexium (Esomeprazole Magnesium) 4)  Zantac 5)  * Omeprazole  Past History:  Past Medical History: Last updated: 11/24/2009 osteopenia anxiety hyperlipidemia vit D deficiency fibromyalgia OA hands  migraine DVT in 1980s   used to see Dr Phylliss Bob for rheum (he passed away)   Past Surgical History: Last updated: 11/24/2009 Hysterectomy (1972) Ovarian cyst removed (1985) Lumpectomy- fibrocystic breast 38756) Colonoscopy- hemorrhoids (1998) Dexa- borderline (05/201) Hepatobil scan- decreased GB EF (03/2003) Cholecystectomy (04/2003) 3/09 colonoscopy neg with hemorrhoids DVT 1980s   Family History: Last updated: 10/02/2008 Father: HTN, cancer of stomach and intestines- died age 18 Mother: CLL, alzheimers- died age 39 Siblings: brother- breast cancer GM with colon cancer neice with aplastic anemia -- bone marrow transplant  Social History: Last  updated: 04/04/2009 Marital Status: Married (husb has CAD) Children: 2 daughters Occupation: owns trucking business Never Smoked Alcohol use-no  Risk Factors: Smoking Status: never (05/11/2007)  Review of Systems General:  Complains of fatigue; denies chills, fever, loss of appetite, and malaise. Eyes:  Denies blurring and eye irritation. CV:  Denies chest pain or discomfort and palpitations. Resp:  Denies cough, shortness of breath, and wheezing. GI:  Complains of abdominal pain, indigestion, and nausea; denies vomiting. Derm:  Denies poor wound healing and rash. Heme:  Denies abnormal bruising and bleeding.  Physical Exam  General:  Well-developed,well-nourished,in no acute distress; alert,appropriate and cooperative throughout examination Head:  normocephalic, atraumatic, and no abnormalities observed.   Eyes:  vision grossly intact, pupils equal, pupils round, and pupils reactive to light.  no conjunctival pallor, injection or icterus  Mouth:  pharynx pink and moist.   Neck:  supple with full rom and no masses or thyromegally, no JVD or carotid bruit  Lungs:  Normal respiratory effort, chest expands symmetrically. Lungs are clear to auscultation, no crackles or wheezes. Heart:  Normal rate and regular rhythm. S1 and S2 normal without gallop, murmur, click, rub or other extra sounds. Abdomen:  epigastric tenderness and LUQ - mild with no rebound or gaurding  soft, normal bowel sounds, no distention, no masses, no hepatomegaly, and no splenomegaly.   Msk:  no acute joint changes Extremities:  No clubbing, cyanosis, edema, or deformity noted with normal full range of motion of all joints.   Neurologic:  gait normal and DTRs symmetrical and normal.   Skin:  L 4th finger 1-2 cm area of erythema and scale (proximal/ dorsal finger) no vesicle or skin interruption no fb seen Cervical Nodes:  No lymphadenopathy noted Psych:  normal affect, talkative and pleasant    Impression &  Recommendations:  Problem # 1:  GASTRITIS (ICD-535.50) Assessment Improved this is significantly improved but not resolved with prevacid after failing omeprazole and nexium  will continue bland diet  ref to GI for further eval and likely egd father had GI cancer that is of course worrisome to her  Her updated medication list for this problem includes:    Prevacid 30 Mg Cpdr (Lansoprazole) .Marland Kitchen... 1 by mouth once daily  Orders: Gastroenterology Referral (GI)  Problem # 2:  CONTACT DERMATITIS (ICD-692.9) Assessment: New  patch on proximal/ dorsal L 4th finger after gardening  itchy and persistant for 1 mo  could be plant or insect rel (no hx of psoriasis) trial of elocon cream and update  if not imp may need bx Her updated medication list for this problem includes:    Elocon 0.1 % Crea (Mometasone furoate) .Marland Kitchen... Apply to affected area on finger once daily as needed  Orders: Prescription Created Electronically [phone])  Complete Medication List: 1)  Calcium 600 Mg Tabs (Calcium) .... Take one by mouth twice a day 2)  Vitamin D 1000 Unit Tabs (Cholecalciferol) .... One by mouth daily 3)  Imitrex 50 Mg Tabs (Sumatriptan succinate) .Marland Kitchen.. 1 by mouth times one at onset of headache (maximum one pill in a day) 4)  Xanax 0.5 Mg Tabs (Alprazolam) .Marland Kitchen.. 1 by mouth up to two times a day as needed severe anxiety 5)  Fish Oil 1200 Mg Caps (Omega-3 fatty acids) .... Take one by mouth daily 6)  Flexeril 10 Mg Tabs (Cyclobenzaprine hcl) .Marland Kitchen.. 1 by mouth at bedtime as needed 7)  Fluticasone Propionate 50 Mcg/act Susp (Fluticasone propionate) .... One spray each nostril daily as needed 8)  Lidoderm 5 % Ptch (Lidocaine) .... Apply as needed 9)  Vicodin 5-500 Mg Tabs (Hydrocodone-acetaminophen) .Marland Kitchen.. 1 by mouth up to every 4-6 hours as needed for pain 10)  Cvs Sinius and Congestion  .... Otc as directed. 11)  Miacalcin 200 Unit/ml Soln (Calcitonin (salmon)) .Marland Kitchen.. 1 spray in one nostril daily- alternating   nostrils 12)  Zostavax 70350 Unt/0.38ml Solr (Zoster vaccine live) .... Inject times one as directed 13)  Prevacid 30 Mg Cpdr (Lansoprazole) .Marland Kitchen.. 1 by mouth once daily 14)  Elocon 0.1 % Crea (Mometasone furoate) .... Apply to affected area on finger once daily as needed  Patient Instructions: 1)  continue the generic prevacid  2)  we will do GI ref at check out  3)  continue fairly bland diet  4)  try the elicon cream for finger - and update me if not improved in 2 weeks Prescriptions: ELOCON 0.1 % CREA (MOMETASONE FUROATE) apply to affected area on finger once daily as needed  #1 small x 0   Entered and Authorized by:   Judith Part MD   Signed by:   Judith Part MD on 02/10/2010   Method used:   Electronically to        [facility]* (retail)       [address]       [place], [place]  [address]  Ph: [phone]       Fax: [phone]   RxID:   [id number]    Orders Added: 1)  Gastroenterology Referral [GI] 2)  Prescription Created Electronically [G8553] 3)  Est. Patient Level IV [63016]    Current Allergies (reviewed today): ! AUGMENTIN (AMOXICILLIN-POT CLAVULANATE) ! AMOXICILLIN ! NEXIUM (ESOMEPRAZOLE MAGNESIUM) ZANTAC * OMEPRAZOLE

## 2010-04-28 NOTE — Progress Notes (Signed)
Summary: Cyclobenzaprine HCL 10mg   Phone Note Refill Request Call back at [phone] Message from:  [facility] on October 13, 2009 10:16 AM  Refills Requested: Medication #1:  FLEXERIL 10 MG TABS 1 by mouth at bedtime as needed   Last Refilled: 09/10/2009 [facility] faxed refill request for Cyclobenzaprine 10mg .Please advise.    Method Requested: Telephone to Pharmacy Initial call taken by: Lewanda Rife LPN,  October 13, 2009 10:16 AM  Follow-up for Phone Call        px written on EMR for call in  Follow-up by: Judith Part MD,  October 13, 2009 10:26 AM  Additional Follow-up for Phone Call Additional follow up Details #1::        Medication phoned to [facility] pharmacy as instructed. Lewanda Rife LPN  October 13, 2009 12:25 PM     Prescriptions: FLEXERIL 10 MG TABS (CYCLOBENZAPRINE HCL) 1 by mouth at bedtime as needed  #30 x 0   Entered and Authorized by:   Judith Part MD   Signed by:   Lewanda Rife LPN on 11/91/4782   Method used:   Telephoned to ...         RxID:   [id number]

## 2010-04-28 NOTE — Progress Notes (Signed)
Summary: refill requests for flexeril and naproxen  Phone Note Refill Request Message from:  Fax from Pharmacy  Refills Requested: Medication #1:  FLEXERIL 10 MG TABS 1 by mouth at bedtime as needed   Last Refilled: 10/13/2009  Medication #2:  naproxen   Last Refilled: 09/10/2009 Faxed requests from [facility], naproxen is no longer on med list  Initial call taken by: Lowella Petties CMA,  November 10, 2009 10:08 AM  Follow-up for Phone Call        px written on EMR for call in  Follow-up by: Judith Part MD,  November 10, 2009 11:07 AM  Additional Follow-up for Phone Call Additional follow up Details #1::        Rx called to pharmacy Additional Follow-up by: Sydell Axon LPN,  November 10, 2009 11:23 AM    New/Updated Medications: NAPROSYN 500 MG TABS (NAPROXEN) 1 by mouth two times a day with food as needed pain Prescriptions: NAPROSYN 500 MG TABS (NAPROXEN) 1 by mouth two times a day with food as needed pain  #60 x 1   Entered and Authorized by:   Judith Part MD   Signed by:   Sydell Axon LPN on 30/86/5784   Method used:   Telephoned to ...         RxID:   [id number] FLEXERIL 10 MG TABS (CYCLOBENZAPRINE HCL) 1 by mouth at bedtime as needed  #30 x 2   Entered and Authorized by:   Judith Part MD   Signed by:   Sydell Axon LPN on 02/72/5366   Method used:   Telephoned to ...         RxID:   [id number]

## 2010-04-28 NOTE — Progress Notes (Signed)
Summary: refill request for imitrex  Phone Note Refill Request Message from:  Fax from Pharmacy  Refills Requested: Medication #1:  IMITREX 50 MG TABS 1 by mouth times one at onset of headache (maximum one pill in a day)   Last Refilled: 06/02/2009 Faxed request from [facility].  Initial call taken by: Lowella Petties CMA,  June 26, 2009 9:19 AM  Follow-up for Phone Call        px written on EMR for call in  Follow-up by: Judith Part MD,  June 26, 2009 9:36 AM    Prescriptions: IMITREX 50 MG TABS (SUMATRIPTAN SUCCINATE) 1 by mouth times one at onset of headache (maximum one pill in a day)  #9 x 11   Entered by:   Benny Lennert CMA (AAMA)   Authorized by:   Judith Part MD   Signed by:   Benny Lennert CMA (AAMA) on 06/26/2009   Method used:   Electronically to        [facility]* (retail)       [address]       [place], [place]  [address]       Ph: [phone]       Fax: [phone]   RxID:   [id number]

## 2010-04-28 NOTE — Progress Notes (Signed)
Summary: refill request for naproxen  Phone Note Refill Request Message from:  Fax from Pharmacy  Refills Requested: Medication #1:  naproxen 500 mg Faxed request from [facility], this is no longer on med list.  Initial call taken by: Lowella Petties CMA,  July 09, 2009 12:38 PM  Follow-up for Phone Call        she has been on this before  adv to take as needed with meals - watch for GI upset  do not mix with other nsaids otc  take only if needed  please call in naproxen 500 mg 1 by mouth two times a day with food as needed #60 1 ref  add to med list when computer lets you  Follow-up by: Judith Part MD,  July 09, 2009 1:11 PM  Additional Follow-up for Phone Call Additional follow up Details #1::        Medication phoned to [facility] pharmacy as instructed. Spoke with [employer] and she will make sure pt knows to take as needed with food and not to mix with nsaids.Lewanda Rife LPN  July 09, 2009 2:45 PM

## 2010-04-28 NOTE — Assessment & Plan Note (Signed)
Summary: 6WK FOLLOW UP / LFW   Vital Signs:  Patient profile:   70 year old female Height:      64.25 inches Weight:      146.75 pounds BMI:     25.08 Temp:     98 degrees F oral Pulse rate:   80 / minute Pulse rhythm:   regular BP sitting:   118 / 70  (left arm) Cuff size:   regular  Vitals Entered By: Lewanda Rife LPN (January 06, 2010 9:35 AM) CC: six week f/u   History of Present Illness: here for f/u of gastritis last visit was inst to stop all nsaids and start omeprazole daily (since zantac did not work)   stomach is some improved but not totally better some nights burning in epigastric and L side  watching what she eats - no soda / no coffee/ no spicy food  no nsaids  no missed doses of omeprazole     Allergies: 1)  ! Augmentin (Amoxicillin-Pot Clavulanate) 2)  ! Amoxicillin 3)  Zantac 4)  * Omeprazole  Past History:  Past Medical History: Last updated: 11/24/2009 osteopenia anxiety hyperlipidemia vit D deficiency fibromyalgia OA hands  migraine DVT in 1980s   used to see Dr Phylliss Bob for rheum (he passed away)   Past Surgical History: Last updated: 11/24/2009 Hysterectomy (1972) Ovarian cyst removed (1985) Lumpectomy- fibrocystic breast 16109) Colonoscopy- hemorrhoids (1998) Dexa- borderline (05/201) Hepatobil scan- decreased GB EF (03/2003) Cholecystectomy (04/2003) 3/09 colonoscopy neg with hemorrhoids DVT 1980s   Family History: Last updated: 10/02/2008 Father: HTN, cancer of stomach and intestines- died age 35 Mother: CLL, alzheimers- died age 46 Siblings: brother- breast cancer GM with colon cancer neice with aplastic anemia -- bone marrow transplant  Social History: Last updated: 04/04/2009 Marital Status: Married (husb has CAD) Children: 2 daughters Occupation: owns trucking business Never Smoked Alcohol use-no  Risk Factors: Smoking Status: never (05/11/2007)  Review of Systems General:  Denies fatigue, loss of appetite,  and malaise. Eyes:  Denies blurring and eye irritation. ENT:  Denies sore throat. CV:  Denies chest pain or discomfort and palpitations. Resp:  Denies cough, shortness of breath, and wheezing. GI:  Complains of abdominal pain and indigestion; denies nausea and vomiting. GU:  Denies dysuria. Derm:  Denies poor wound healing and rash. Neuro:  Denies numbness and sensation of room spinning. Heme:  Denies abnormal bruising and bleeding.  Physical Exam  General:  Well-developed,well-nourished,in no acute distress; alert,appropriate and cooperative throughout examination Head:  normocephalic, atraumatic, and no abnormalities observed.   Eyes:  vision grossly intact, pupils equal, pupils round, and pupils reactive to light.  no conjunctival pallor, injection or icterus  Mouth:  pharynx pink and moist.   Neck:  supple with full rom and no masses or thyromegally, no JVD or carotid bruit  Chest Wall:  No deformities, masses, or tenderness noted. Lungs:  Normal respiratory effort, chest expands symmetrically. Lungs are clear to auscultation, no crackles or wheezes. Heart:  Normal rate and regular rhythm. S1 and S2 normal without gallop, murmur, click, rub or other extra sounds. Abdomen:  mild epigastric tenderness and LUQ tenderness without rebound or gauding soft, normal bowel sounds, no distention, no masses, no hepatomegaly, and no splenomegaly.   Msk:  no kyphosis  no acute joint changes  Extremities:  No clubbing, cyanosis, edema, or deformity noted with normal full range of motion of all joints.   Neurologic:  gait normal and DTRs symmetrical and normal.   Skin:  Intact without suspicious lesions or rashes no pallor or jaundice  Cervical Nodes:  No lymphadenopathy noted Inguinal Nodes:  No significant adenopathy Psych:  normal affect, talkative and pleasant    Impression & Recommendations:  Problem # 1:  GASTRITIS (ICD-535.50) Assessment Improved  improved but not resolved with  careful diet and omeprazole stress plays a role change to nexium 40  update if prob or uneffective  if not imp - will ref to GI for egd (pt's father did have GI cancer - will remember that) f/u 6 wk The following medications were removed from the medication list:    Omeprazole 20 Mg Cpdr (Omeprazole) .Marland Kitchen... 1 by mouth once daily in am Her updated medication list for this problem includes:    Nexium 40 Mg Cpdr (Esomeprazole magnesium) .Marland Kitchen... 1 by mouth once daily in am  Orders: Prescription Created Electronically [phone]  Complete Medication List: 1)  Calcium 600 Mg Tabs (Calcium) .... Take one by mouth twice a day 2)  Vitamin D 1000 Unit Tabs (Cholecalciferol) .... One by mouth daily 3)  Imitrex 50 Mg Tabs (Sumatriptan succinate) .Marland Kitchen.. 1 by mouth times one at onset of headache (maximum one pill in a day) 4)  Xanax 0.5 Mg Tabs (Alprazolam) .Marland Kitchen.. 1 by mouth up to two times a day as needed severe anxiety 5)  Fish Oil 1200 Mg Caps (Omega-3 fatty acids) .... Take one by mouth daily 6)  Flexeril 10 Mg Tabs (Cyclobenzaprine hcl) .Marland Kitchen.. 1 by mouth at bedtime as needed 7)  Fluticasone Propionate 50 Mcg/act Susp (Fluticasone propionate) .... One spray each nostril daily as needed 8)  Lidoderm 5 % Ptch (Lidocaine) .... Apply as needed 9)  Vicodin 5-500 Mg Tabs (Hydrocodone-acetaminophen) .Marland Kitchen.. 1 by mouth up to every 4-6 hours as needed for pain 10)  [facility] Sinius and Congestion  .... Otc as directed. 11)  Miacalcin 200 Unit/ml Soln (Calcitonin (salmon)) .Marland Kitchen.. 1 spray in one nostril daily- alternating  nostrils 12)  Zostavax 60454 Unt/0.2ml Solr (Zoster vaccine live) .... Inject times one as directed 13)  Nexium 40 Mg Cpdr (Esomeprazole magnesium) .Marland Kitchen.. 1 by mouth once daily in am  Patient Instructions: 1)  take nexium each am starting today  (stop the omeprazole) 2)  keep up the good diet  3)  keep working on stress 4)  if worse or any new symptoms let me know  5)  follow up with me in 6  weeks Prescriptions: NEXIUM 40 MG CPDR (ESOMEPRAZOLE MAGNESIUM) 1 by mouth once daily in am  #30 x 11   Entered and Authorized by:   Judith Part MD   Signed by:   Judith Part MD on 01/06/2010   Method used:   Electronically to        [facility]* (retail)       [address]       [place], [place]  [address]       Ph: [phone]       Fax: [phone]   RxID:   [id number]   Current Allergies (reviewed today): ! AUGMENTIN (AMOXICILLIN-POT CLAVULANATE) ! AMOXICILLIN ZANTAC * OMEPRAZOLE

## 2010-04-28 NOTE — Assessment & Plan Note (Signed)
Summary: 6 MO. F/U/BIR   Vital Signs:  Patient profile:   70 year old female Weight:      144 pounds BMI:     24.61 Temp:     97.7 degrees F oral Pulse rate:   76 / minute Pulse rhythm:   regular BP sitting:   138 / 76  (left arm) Cuff size:   regular  Vitals Entered By: Lowella Petties CMA (April 04, 2009 8:30 AM) CC: 6 month follow up   History of Present Illness: here for 6 mo f/u   has been feeling pretty good overall  nothing new  same old aches and pains headaches are fairly well controlled with as needed meds   last lipids high with trig 85/ HDL 86 (good ) and LDL 142 asked to watch sat fats in diet  is doing pretty good with diet ( husband had MI and is on very low fat diet )   wt is down 3 lb   bp 138/76 today  has appt with Dr Phylliss Bob in april- will disc dexa (last one there) has had foot fx  is trying to get back to exercise   Allergies: No Known Drug Allergies  Past History:  Past Medical History: Last updated: 09/06/2008 osteopenia anxiety hyperlipidemia vit D deficiency fibromyalgia OA hands  migraine  Past Surgical History: Last updated: 06/19/2007 Hysterectomy (1972) Ovarian cyst removed (1985) Lumpectomy- fibrocystic breast 16109) Colonoscopy- hemorrhoids (1998) Dexa- borderline (05/201) Hepatobil scan- decreased GB EF (03/2003) Cholecystectomy (04/2003) 3/09 colonoscopy neg with hemorrhoids  Family History: Last updated: 10/02/2008 Father: HTN, cancer of stomach and intestines- died age 34 Mother: CLL, alzheimers- died age 60 Siblings: brother- breast cancer GM with colon cancer neice with aplastic anemia -- bone marrow transplant  Social History: Last updated: 04/04/2009 Marital Status: Married (husb has CAD) Children: 2 daughters Occupation: owns trucking business Never Smoked Alcohol use-no  Risk Factors: Smoking Status: never (05/11/2007)  Social History: Marital Status: Married (husb has CAD) Children: 2  daughters Occupation: owns trucking business Never Smoked Alcohol use-no  Review of Systems General:  Denies fatigue, fever, loss of appetite, and malaise. Eyes:  Denies blurring and eye irritation. CV:  Denies chest pain or discomfort, palpitations, shortness of breath with exertion, and swelling of feet. Resp:  Denies cough and shortness of breath. GI:  Denies abdominal pain, change in bowel habits, and indigestion. MS:  Complains of joint pain; denies joint redness and joint swelling. Derm:  Denies poor wound healing and rash. Neuro:  Complains of headaches; denies numbness and tingling. Psych:  mood is good . Endo:  Denies cold intolerance, excessive thirst, excessive urination, and heat intolerance.  Physical Exam  General:  Well-developed,well-nourished,in no acute distress; alert,appropriate and cooperative throughout examination Head:  normocephalic, atraumatic, and no abnormalities observed.   Eyes:  vision grossly intact, pupils equal, pupils round, and pupils reactive to light.   Mouth:  pharynx pink and moist.   Neck:  supple with full rom and no masses or thyromegally, no JVD or carotid bruit  Lungs:  Normal respiratory effort, chest expands symmetrically. Lungs are clear to auscultation, no crackles or wheezes. Heart:  Normal rate and regular rhythm. S1 and S2 normal without gallop, murmur, click, rub or other extra sounds. Msk:  No deformity or scoliosis noted of thoracic or lumbar spine.   Pulses:  Good pedal pulses bilaterally. Extremities:  No clubbing, cyanosis, edema, or deformity noted with normal full range of motion of all joints.  Neurologic:  sensation intact to light touch, gait normal, and DTRs symmetrical and normal.   Cervical Nodes:  No lymphadenopathy noted Psych:  normal affect, talkative and pleasant    Impression & Recommendations:  Problem # 1:  ESSENTIAL HYPERTENSION (ICD-401.9) Assessment Unchanged  bp is stable off lopressor-- controlling  with lifestyle change will continue to montior  disc starting exercise  lab today  f/u 6 mo  The following medications were removed from the medication list:    Lopressor 50 Mg Tabs (Metoprolol tartrate) .Marland Kitchen... 1/2 by mouth two times a day  BP today: 138/76 Prior BP: 140/64 (01/10/2009)  Labs Reviewed: K+: 4.8 (10/02/2008) Creat: : 1.1 (10/02/2008)   Chol: 242 (10/02/2008)   HDL: 86.20 (10/02/2008)   LDL: DEL (05/11/2007)   TG: 85.0 (10/02/2008)  Problem # 2:  HYPERLIPIDEMIA (ICD-272.2) Assessment: Unchanged  lab today- expect imp with good diet rev low sat fat diet  f/u 23mo  Orders: Venipuncture (47829) TLB-Lipid Panel (80061-LIPID) TLB-ALT (SGPT) (84460-ALT) TLB-AST (SGOT) (84450-SGOT)  Labs Reviewed: SGOT: 26 (10/02/2008)   SGPT: 22 (10/02/2008)   HDL:86.20 (10/02/2008), 94.9 (05/11/2007)  LDL:DEL (05/11/2007)  Chol:242 (10/02/2008), 228 (05/11/2007)  Trig:85.0 (10/02/2008), 90 (05/11/2007)  Problem # 3:  OSTEOPENIA (ICD-733.90) Assessment: Unchanged with recent foot fx- will get dexa with Dr Phylliss Bob disc ca and vit D Her updated medication list for this problem includes:    Calcium 600 Mg Tabs (Calcium) .Marland Kitchen... Take one by mouth bid    Vitamin D 1000 Unit Tabs (Cholecalciferol) ..... One by mouth daily  Complete Medication List: 1)  Calcium 600 Mg Tabs (Calcium) .... Take one by mouth bid 2)  Vitamin D 1000 Unit Tabs (Cholecalciferol) .... One by mouth daily 3)  Imitrex 50 Mg Tabs (Sumatriptan succinate) .Marland Kitchen.. 1 by mouth times one at onset of headache (maximum one pill in a day) 4)  Xanax 0.5 Mg Tabs (Alprazolam) .Marland Kitchen.. 1 by mouth up to two times a day as needed severe anxiety 5)  Fish Oil 1200 Mg Caps (Omega-3 fatty acids) .... Take one by mouth daily  Patient Instructions: 1)  keep watching diet for cholesterol  2)  blood pressure is ok today  3)  work on exercise when you are able  4)  follow up for 30 min check up in 6 months  Prior Medications (reviewed  today): CALCIUM 600 MG  TABS (CALCIUM) take one by mouth bid VITAMIN D 1000 UNIT TABS (CHOLECALCIFEROL) one by mouth daily IMITREX 50 MG TABS (SUMATRIPTAN SUCCINATE) 1 by mouth times one at onset of headache (maximum one pill in a day) XANAX 0.5 MG TABS (ALPRAZOLAM) 1 by mouth up to two times a day as needed severe anxiety FISH OIL 1200 MG CAPS (OMEGA-3 FATTY ACIDS) take one by mouth daily Current Allergies: No known allergies

## 2010-04-28 NOTE — Assessment & Plan Note (Signed)
Summary: 6 MONTH FOLLOW UP CHECK UP/[facility]   Vital Signs:  Patient profile:   70 year old female Height:      64.25 inches Weight:      145.50 pounds BMI:     24.87 Temp:     97.6 degrees F oral Pulse rate:   80 / minute Pulse rhythm:   regular BP sitting:   116 / 68  (left arm) Cuff size:   regular  Vitals Entered By: Lewanda Rife LPN (October 07, 2009 9:33 AM) CC: six month check up LMP complete hyst 1972   History of Present Illness: here for check up of chronic medical problems and also to rev health mt list   is feeling pretty good most of the time  good and bad days with pain   has osteopenia - dexa in 08 ca and D hx of vit D def  lipids due for check Last Lipid ProfileCholesterol: 247 (04/04/2009 8:48:14 AM)HDL:  106.40 (04/04/2009 8:48:14 AM)LDL:  DEL (05/11/2007 10:23:00 AM)Triglycerides:  Last Liver profileSGOT:  24 (04/04/2009 8:48:14 AM)SPGT:  18 (04/04/2009 8:48:14 AM)T. Bili:  1.1 (10/02/2008 11:20:38 AM)Alk Phos:  61 (10/02/2008 11:20:38 AM)   hyst in past - nl pap in 05 no cancer  no gyn symptoms  mam 8/10 self exam - no lumps   colonosc 09 nl -- re check rec 10 y  Td in 05 pneumovax in 09  zoster status - has thought of it - /considering imm  is walking regularly- wt down 3 lb cut out her sugar   took darvocet off the market and needs a substitute       Allergies (verified): No Known Drug Allergies  Review of Systems General:  Denies fatigue, fever, loss of appetite, and malaise. Eyes:  Denies blurring and eye irritation. CV:  Denies chest pain or discomfort, lightheadness, and palpitations. Resp:  Denies cough, shortness of breath, and wheezing. GI:  Denies abdominal pain, bloody stools, change in bowel habits, and nausea. GU:  Denies abnormal vaginal bleeding, discharge, dysuria, and urinary frequency. MS:  Complains of muscle aches, cramps, and stiffness; denies joint pain, joint redness, joint swelling, and muscle weakness. Derm:  Denies  itching, lesion(s), poor wound healing, and rash. Neuro:  Denies numbness and tingling. Psych:  Denies anxiety and depression. Endo:  Denies cold intolerance, excessive thirst, excessive urination, and heat intolerance. Heme:  Denies abnormal bruising and bleeding.  Physical Exam  General:  Well-developed,well-nourished,in no acute distress; alert,appropriate and cooperative throughout examination Head:  normocephalic, atraumatic, and no abnormalities observed.   Eyes:  vision grossly intact, pupils equal, pupils round, and pupils reactive to light.  no conjunctival pallor, injection or icterus  Ears:  R ear normal and L ear normal.  - scant cerumen  Nose:  no nasal discharge.   Mouth:  pharynx pink and moist.   Neck:  supple with full rom and no masses or thyromegally, no JVD or carotid bruit  Chest Wall:  No deformities, masses, or tenderness noted. Breasts:  No mass, nodules, thickening, tenderness, bulging, retraction, inflamation, nipple discharge or skin changes noted.   Lungs:  Normal respiratory effort, chest expands symmetrically. Lungs are clear to auscultation, no crackles or wheezes. Heart:  Normal rate and regular rhythm. S1 and S2 normal without gallop, murmur, click, rub or other extra sounds. Abdomen:  Bowel sounds positive,abdomen soft and non-tender without masses, organomegaly or hernias noted. no renal bruits  Msk:  No deformity or scoliosis noted of thoracic or lumbar  spine.  OA changes in hands  some myofasciall trigger points  Pulses:  R and L carotid,radial,femoral,dorsalis pedis and posterior tibial pulses are full and equal bilaterally Extremities:  No clubbing, cyanosis, edema, or deformity noted with normal full range of motion of all joints.   Neurologic:  sensation intact to light touch, gait normal, and DTRs symmetrical and normal.   Skin:  Intact without suspicious lesions or rashes many SKs of different sizes on trunk/ back and ext  some lentigos  Cervical  Nodes:  No lymphadenopathy noted Axillary Nodes:  No palpable lymphadenopathy Inguinal Nodes:  No significant adenopathy Psych:  normal affect, talkative and pleasant    Impression & Recommendations:  Problem # 1:  ESSENTIAL HYPERTENSION (ICD-401.9) Assessment Unchanged  in good control currently with good lifestyle habits and exercise lab today and adv Orders: Venipuncture (81191) TLB-Lipid Panel (80061-LIPID) TLB-Renal Function Panel (80069-RENAL) TLB-CBC Platelet - w/Differential (85025-CBCD) TLB-Hepatic/Liver Function Pnl (80076-HEPATIC) TLB-TSH (Thyroid Stimulating Hormone) (84443-TSH) T-Vitamin D (25-Hydroxy) (47829-56213)  BP today: 116/68 Prior BP: 118/68 (08/20/2009)  Labs Reviewed: K+: 4.8 (10/02/2008) Creat: : 1.1 (10/02/2008)   Chol: 247 (04/04/2009)   HDL: 106.40 (04/04/2009)   LDL: DEL (05/11/2007)   TG: 44.0 (04/04/2009)  Problem # 2:  OTHER SCREENING MAMMOGRAM (ICD-V76.12) Assessment: Comment Only pt will schedule own mam no changes on breast exam today  Problem # 3:  VITAMIN D DEFICIENCY (ICD-268.9) Assessment: Unchanged check level today in pt with osteopenia who is compliant with her supplements Orders: Venipuncture (08657) TLB-Lipid Panel (80061-LIPID) TLB-Renal Function Panel (80069-RENAL) TLB-CBC Platelet - w/Differential (85025-CBCD) TLB-Hepatic/Liver Function Pnl (80076-HEPATIC) TLB-TSH (Thyroid Stimulating Hormone) (84443-TSH) T-Vitamin D (25-Hydroxy) (84696-29528) Specimen Handling (41324)  Problem # 4:  HYPERLIPIDEMIA (ICD-272.2) Assessment: Unchanged  check labs with low sat fat diet (which we reviewed )  Orders: Venipuncture (40102) TLB-Lipid Panel (80061-LIPID) TLB-Renal Function Panel (80069-RENAL) TLB-CBC Platelet - w/Differential (85025-CBCD) TLB-Hepatic/Liver Function Pnl (80076-HEPATIC) TLB-TSH (Thyroid Stimulating Hormone) (84443-TSH) T-Vitamin D (25-Hydroxy) (72536-64403)  Labs Reviewed: SGOT: 24 (04/04/2009)   SGPT:  18 (04/04/2009)   HDL:106.40 (04/04/2009), 86.20 (10/02/2008)  LDL:DEL (05/11/2007)  Chol:247 (04/04/2009), 242 (10/02/2008)  Trig:44.0 (04/04/2009), 85.0 (10/02/2008)  Problem # 5:  OSTEOPENIA (ICD-733.90) Assessment: Unchanged sched dexa- has been 2 years  disc ca and /d and vit d req  commended on walking for exercise  Her updated medication list for this problem includes:    Calcium 600 Mg Tabs (Calcium) .Marland Kitchen... Take one by mouth bid    Vitamin D 1000 Unit Tabs (Cholecalciferol) ..... One by mouth daily  Orders: Radiology Referral (Radiology) Venipuncture [phone]) TLB-Lipid Panel (80061-LIPID) TLB-Renal Function Panel (80069-RENAL) TLB-CBC Platelet - w/Differential (85025-CBCD) TLB-Hepatic/Liver Function Pnl (80076-HEPATIC) TLB-TSH (Thyroid Stimulating Hormone) (84443-TSH) T-Vitamin D (25-Hydroxy) (95638-75643) Specimen Handling (32951)  Problem # 6:  FIBROMYALGIA (ICD-729.1) Assessment: Unchanged needs replacement for darvocet for severe pain days - that are not frequent  vicodin px - warned not to mix with tylenol  update if not imp  flexeril as needed also - warned of sedation The following medications were removed from the medication list:    Naprosyn 500 Mg Tabs (Naproxen) .Marland Kitchen... Take one tablet by mouth two times a day with food as needed Her updated medication list for this problem includes:    Flexeril 10 Mg Tabs (Cyclobenzaprine hcl) .Marland Kitchen... 1 by mouth at bedtime as needed    Vicodin 5-500 Mg Tabs (Hydrocodone-acetaminophen) .Marland Kitchen... 1 by mouth up to every 4-6 hours as needed for pain  Complete Medication List: 1)  Calcium  600 Mg Tabs (Calcium) .... Take one by mouth bid 2)  Vitamin D 1000 Unit Tabs (Cholecalciferol) .... One by mouth daily 3)  Imitrex 50 Mg Tabs (Sumatriptan succinate) .Marland Kitchen.. 1 by mouth times one at onset of headache (maximum one pill in a day) 4)  Xanax 0.5 Mg Tabs (Alprazolam) .Marland Kitchen.. 1 by mouth up to two times a day as needed severe anxiety 5)  Fish Oil  1200 Mg Caps (Omega-3 fatty acids) .... Take one by mouth daily 6)  Flexeril 10 Mg Tabs (Cyclobenzaprine hcl) .Marland Kitchen.. 1 by mouth at bedtime as needed 7)  Fluticasone Propionate 50 Mcg/act Susp (Fluticasone propionate) .... One spray each nostril daily as needed 8)  Lidoderm 5 % Ptch (Lidocaine) .... Apply as needed 9)  Vicodin 5-500 Mg Tabs (Hydrocodone-acetaminophen) .Marland Kitchen.. 1 by mouth up to every 4-6 hours as needed for pain  Patient Instructions: 1)  we will schedule dexa at check out 2)  don't forget to schedule your mammogram  3)  if you are interested in shingles vaccine- check with your insurance and then call us back to schedule  4)  I recommend you see your dermatologist every 2-3 years for a full skin exam- keep using your sunscreen  Prescriptions: VICODIN 5-500 MG TABS (HYDROCODONE-ACETAMINOPHEN) 1 by mouth up to every 4-6 hours as needed for pain  #30 x 0   Entered and Authorized by:   Judith Part MD   Signed by:   Judith Part MD on 10/07/2009   Method used:   Print then Give to Patient   RxID:   [id number]   Current Allergies (reviewed today): No known allergies

## 2010-04-30 NOTE — Consult Note (Signed)
Summary: [facility]  [facility]   Imported By: Lanelle Bal 03/31/2010 09:10:51  _____________________________________________________________________  External Attachment:    Type:   Image     Comment:   External Document

## 2010-04-30 NOTE — Letter (Signed)
Summary: [facility]  [facility]   Imported By: Maryln Gottron 04/22/2010 10:48:14  _____________________________________________________________________  External Attachment:    Type:   Image     Comment:   External Document

## 2010-04-30 NOTE — Progress Notes (Signed)
Summary: vicodin  Phone Note Refill Request Message from:  Fax from Pharmacy on April 13, 2010 9:59 AM  Refills Requested: Medication #1:  VICODIN 5-500 MG TABS 1 by mouth up to every 4-6 hours as needed for pain   Last Refilled: 10/16/2009 Refill request from [place]. [phone].   Initial call taken by: Melody Comas,  April 13, 2010 10:00 AM  Follow-up for Phone Call        px written on EMR for call in  Follow-up by: Judith Part MD,  April 13, 2010 11:26 AM  Additional Follow-up for Phone Call Additional follow up Details #1::        Rx called to pharmacy Additional Follow-up by: Benny Lennert CMA Duncan Dull),  April 13, 2010 11:49 AM    New/Updated Medications: VICODIN 5-500 MG TABS (HYDROCODONE-ACETAMINOPHEN) 1 by mouth up to every 4-6 hours as needed for pain Prescriptions: VICODIN 5-500 MG TABS (HYDROCODONE-ACETAMINOPHEN) 1 by mouth up to every 4-6 hours as needed for pain  #30 x 0   Entered and Authorized by:   Judith Part MD   Signed by:   Benny Lennert CMA (AAMA) on 04/13/2010   Method used:   Telephoned to ...       [facility]* (retail)       [address]       [place], [place]  [address]       Ph: [phone]       Fax: [phone]   RxID:   [id number]

## 2010-04-30 NOTE — Procedures (Signed)
Summary: Upper GI End[facility] by Dr.James [facility]  [facility] by Dr[facility]James [facility]   Imported By: Beau Fanny 04/16/2010 09:11:47  _____________________________________________________________________  External Attachment:    Type:   Image     Comment:   External Document  Appended Document: [facility] Endoscopy by Dr[facility]James [facility]    Clinical Lists Changes  Observations: Added new observation of PAST SURG HX: Hysterectomy (1972) Ovarian cyst removed (1985) Lumpectomy- fibrocystic breast 29528) Colonoscopy- hemorrhoids (1998) Dexa- borderline (05/201) Hepatobil scan- decreased GB EF (03/2003) Cholecystectomy (04/2003) 3/09 colonoscopy neg with hemorrhoids DVT 1980s  EGD 1/12-- GERd, [facility]  (04-28-10 22:44) Added new observation of PAST MED HX: osteopenia anxiety hyperlipidemia vit D deficiency fibromyalgia OA hands  migraine DVT in 1980s   used to see Dr Phylliss Bob for rheum (he passed away)  GI- Edwards  (Apr 28, 2010 22:44)       Past Medical History:    osteopenia    anxiety    hyperlipidemia    vit D deficiency    fibromyalgia    OA hands     migraine    DVT in 1980s            used to see Dr Phylliss Bob for rheum (he passed away)     [name]  Past Surgical History:    Hysterectomy (1972)    Ovarian cyst removed (1985)    Lumpectomy- fibrocystic breast 41324)    Colonoscopy- hemorrhoids (1998)    Dexa- borderline (05/201)    Hepatobil scan- decreased GB EF (03/2003)    Cholecystectomy (04/2003)    3/09 colonoscopy neg with hemorrhoids    DVT 1980s     EGD 1/12-- GERd, [facility]

## 2010-05-06 ENCOUNTER — Encounter: Payer: Self-pay | Admitting: Family Medicine

## 2010-05-26 NOTE — Letter (Signed)
Summary: [facility]   Imported By: Lanelle Bal 05/18/2010 14:38:59  _____________________________________________________________________  External Attachment:    Type:   Image     Comment:   External Document

## 2010-06-18 ENCOUNTER — Other Ambulatory Visit: Payer: Self-pay | Admitting: *Deleted

## 2010-06-18 NOTE — Telephone Encounter (Signed)
Sent to me in error, will forward to [facility] as this is Dr. Royden Purl patient.

## 2010-06-18 NOTE — Telephone Encounter (Signed)
Sent to me in error, Dr. Royden Purl pt.  Will forward to rena.

## 2010-06-22 NOTE — Telephone Encounter (Signed)
Sent note to Dr. Dayton Martes

## 2010-06-23 MED ORDER — ALPRAZOLAM 0.5 MG PO TABS: 0.5000 mg | ORAL_TABLET | Freq: Two times a day (BID) | ORAL | Status: DC | PRN

## 2010-06-23 NOTE — Telephone Encounter (Signed)
Medication phoned to [facility] as instructed.  

## 2010-06-23 NOTE — Telephone Encounter (Signed)
Px written for call in   

## 2010-07-23 ENCOUNTER — Other Ambulatory Visit: Payer: Self-pay | Admitting: *Deleted

## 2010-07-23 MED ORDER — SUMATRIPTAN SUCCINATE 50 MG PO TABS: ORAL_TABLET | ORAL | Status: DC

## 2010-07-23 NOTE — Telephone Encounter (Signed)
Medication phoned to [facility] as instructed.  

## 2010-07-23 NOTE — Telephone Encounter (Signed)
Px written for call in   

## 2010-08-03 ENCOUNTER — Telehealth: Payer: Self-pay | Admitting: *Deleted

## 2010-08-03 MED ORDER — MECLIZINE HCL 25 MG PO TABS: 25.0000 mg | ORAL_TABLET | Freq: Three times a day (TID) | ORAL | Status: AC | PRN

## 2010-08-03 NOTE — Telephone Encounter (Signed)
Patient notified as instructed by telephone. Pt said dizzy when moving around over the weekend. Feels better today and went to work. Pt does not have glaucoma. Medication phoned to [facility] pharmacy as instructed.

## 2010-08-03 NOTE — Telephone Encounter (Signed)
Patient is asking if she can get something called in for vertigo. Uses [facility].

## 2010-08-03 NOTE — Telephone Encounter (Signed)
Please let me know what her symptoms are ---  Will try meclizine -- but cannot take it if she has glaucoma and warn it will sedate F/u if worse or not improved  Px written for call in

## 2010-08-10 ENCOUNTER — Other Ambulatory Visit: Payer: Self-pay | Admitting: *Deleted

## 2010-08-10 MED ORDER — LIDOCAINE 5 % EX PTCH: 1.0000 | MEDICATED_PATCH | CUTANEOUS | Status: DC

## 2010-08-10 NOTE — Telephone Encounter (Signed)
[facility] confirmed they did get escribed rx and it does not need to be called in.

## 2010-08-10 NOTE — Telephone Encounter (Signed)
Px written for call in   

## 2010-08-10 NOTE — Telephone Encounter (Signed)
Can this be refilled? 

## 2010-08-14 NOTE — Op Note (Signed)
NAMECURTISTINE, PETTITT                            ACCOUNT NO.:  [id number]   MEDICAL RECORD NO.:  [id number]                   PATIENT TYPE:  OIB   LOCATION:  5732                                 FACILITY:  [facility]   PHYSICIAN:  Abigail Miyamoto, M.D.              DATE OF BIRTH:  April 15, 1940   DATE OF PROCEDURE:  04/26/2003  DATE OF DISCHARGE:                                 OPERATIVE REPORT   PREOPERATIVE DIAGNOSIS:  Biliary dyskinesia.   POSTOPERATIVE DIAGNOSIS:  Biliary dyskinesia.   PROCEDURE:  Laparoscopic cholecystectomy.   SURGEON:  Douglas A. Magnus Ivan, M.D.   ANESTHESIA:  General endotracheal anesthesia and 0.25% Marcaine.   ESTIMATED BLOOD LOSS:  Minimal.   PROCEDURE IN DETAIL:  The patient was brought to the operating room and  identified as Sarah Young.  She was placed supine on the operating table,  general anesthesia was induced.  Her abdomen was then prepped and draped in  the usual sterile fashion.  Using a 15 blade, a small vertical incision  was  made below the umbilicus.  The incision was carried down through the fascia  which was opened with the scalpel.  A hemostat was then used to pass through  the peritoneal cavity.  A 0 Vicryl purse-string suture was then placed  around the fascial opening.  A Hasson port was placed through the opening  and insufflation of the abdomen was begun.  A 12 mm port was placed in the  patient's epigastrium and two 5 mm ports were placed in the patient's right  flank under direct vision.   The gallbladder was grasped and retracted above the liver bed.  Dissection  was carried out at the base of the gallbladder.  The cystic duct was then  easily dissected out along with the cystic artery.  The duct was clipped  three times proximally and once distally.  The artery was clipped twice  proximally and once distally.  Both were then transected with laparoscopic  scissors.  The gallbladder was then easily dissected free from the liver bed  with the electrocautery.  Once the gallbladder was free from the liver bed,  the liver bed was examined, and hemostasis was achieved with the cautery.  The gallbladder was then removed through the incision at the umbilicus after  being placed in an endosac.  The 0 Vicryl in the umbilicus was tied to close  the fascial defect.  The abdomen was irrigated with normal saline.  Again,  hemostasis appeared to be achieved.  All ports were removed under direct  vision and the abdomen was deflated.  All incisions were anesthetized with  0.25% Marcaine and closed with 4-0 Vicryl  subcuticular sutures.  Steri-Strips, gauze, and tape was applied.  The  patient tolerated the procedure well.  All sponge, needle and instrument  counts were correct at the end of the procedure.  The patient was then  extubated  in the operating room and taken in stable condition to the  recovery room.                                               Abigail Miyamoto, M.D.    DB/MEDQ  D:  04/26/2003  T:  04/26/2003  Job:  [id number]   cc:   Sarah Young., M.D.  [address], [address]  [place]  [place] [address]  Fax: [phone]

## 2010-09-03 ENCOUNTER — Other Ambulatory Visit: Payer: Self-pay | Admitting: *Deleted

## 2010-09-03 DIAGNOSIS — F411 Generalized anxiety disorder: Secondary | ICD-10-CM

## 2010-09-03 DIAGNOSIS — IMO0001 Reserved for inherently not codable concepts without codable children: Secondary | ICD-10-CM

## 2010-09-03 MED ORDER — HYDROCODONE-ACETAMINOPHEN 5-500 MG PO TABS: ORAL_TABLET | ORAL | Status: DC

## 2010-09-03 MED ORDER — ALPRAZOLAM 0.5 MG PO TABS: ORAL_TABLET | ORAL | Status: DC

## 2010-09-03 NOTE — Telephone Encounter (Signed)
Px written for call in   

## 2010-09-04 NOTE — Telephone Encounter (Signed)
Medication phoned to [facility] as instructed.  

## 2010-11-09 ENCOUNTER — Other Ambulatory Visit: Payer: Self-pay | Admitting: *Deleted

## 2010-11-09 MED ORDER — SUMATRIPTAN SUCCINATE 50 MG PO TABS: ORAL_TABLET | ORAL | Status: DC

## 2010-11-09 NOTE — Telephone Encounter (Signed)
I sent electronically to [facility]

## 2010-12-10 ENCOUNTER — Other Ambulatory Visit: Payer: Self-pay | Admitting: Family Medicine

## 2010-12-10 DIAGNOSIS — Z1231 Encounter for screening mammogram for malignant neoplasm of breast: Secondary | ICD-10-CM

## 2010-12-29 ENCOUNTER — Other Ambulatory Visit: Payer: Self-pay | Admitting: *Deleted

## 2010-12-29 NOTE — Telephone Encounter (Signed)
Received faxed refill request.  Nasal spray was no on med list, please advise.

## 2010-12-29 NOTE — Telephone Encounter (Signed)
Needs appt with me in nov Ask her about the miacalcin Then send back to me - computer will not let me fill sign one without the other

## 2010-12-31 MED ORDER — CALCITONIN (SALMON) 200 UNIT/ACT NA SOLN: NASAL | Status: DC

## 2010-12-31 MED ORDER — HYDROCODONE-ACETAMINOPHEN 5-500 MG PO TABS: ORAL_TABLET | ORAL | Status: DC

## 2010-12-31 NOTE — Telephone Encounter (Signed)
Px written for call in   

## 2010-12-31 NOTE — Telephone Encounter (Signed)
Patient notified as instructed by telephone. Pt said she is using Miacalcin,(Miacalcin was listed on pts med list in centricity). Pt is in [place] now but will return home tomorrow and will call for appt in Nov. Then.

## 2010-12-31 NOTE — Telephone Encounter (Signed)
Medication phoned to [facility] pharmacy as instructed. Pt will ck with pharmacy tomorrow for refill.

## 2011-01-15 ENCOUNTER — Ambulatory Visit
Admission: RE | Admit: 2011-01-15 | Discharge: 2011-01-15 | Disposition: A | Discharge status: Home / Self Care | Payer: Medicare Other | Source: Ambulatory Visit | Attending: Family Medicine | Admitting: Family Medicine

## 2011-01-15 DIAGNOSIS — Z1231 Encounter for screening mammogram for malignant neoplasm of breast: Secondary | ICD-10-CM

## 2011-01-22 ENCOUNTER — Encounter: Payer: Self-pay | Admitting: *Deleted

## 2011-02-04 ENCOUNTER — Other Ambulatory Visit: Payer: Self-pay | Admitting: Gastroenterology

## 2011-02-04 DIAGNOSIS — K7689 Other specified diseases of liver: Secondary | ICD-10-CM

## 2011-02-15 ENCOUNTER — Ambulatory Visit
Admission: RE | Admit: 2011-02-15 | Discharge: 2011-02-15 | Disposition: A | Discharge status: Home / Self Care | Payer: Medicare Other | Source: Ambulatory Visit | Attending: Gastroenterology | Admitting: Gastroenterology

## 2011-02-15 DIAGNOSIS — K7689 Other specified diseases of liver: Secondary | ICD-10-CM

## 2011-02-26 ENCOUNTER — Other Ambulatory Visit: Payer: Self-pay

## 2011-02-26 MED ORDER — SUMATRIPTAN SUCCINATE 50 MG PO TABS: ORAL_TABLET | ORAL | Status: DC

## 2011-02-26 MED ORDER — HYDROCODONE-ACETAMINOPHEN 5-500 MG PO TABS: ORAL_TABLET | ORAL | Status: DC

## 2011-02-26 NOTE — Telephone Encounter (Signed)
Px written for call in   

## 2011-02-26 NOTE — Telephone Encounter (Signed)
Medication phoned to [facility] as instructed.  

## 2011-02-26 NOTE — Telephone Encounter (Signed)
Will refill electronically  

## 2011-02-26 NOTE — Telephone Encounter (Signed)
[facility] faxed refill for Sumatriptan Succ 50 mg. Last filled 12/29/10 and pt last seen 02/10/10.Please advise.

## 2011-02-26 NOTE — Telephone Encounter (Signed)
[facility] faxed refill request Hydrocodone APAP 5-500 mg. Last filled 12/31/10 and pt last seen 02/10/10.Please advise.

## 2011-03-19 ENCOUNTER — Other Ambulatory Visit: Payer: Self-pay | Admitting: *Deleted

## 2011-03-19 MED ORDER — CYCLOBENZAPRINE HCL 10 MG PO TABS: 10.0000 mg | ORAL_TABLET | Freq: Every evening | ORAL | Status: DC | PRN

## 2011-03-19 NOTE — Telephone Encounter (Signed)
Will refill electronically  

## 2011-03-19 NOTE — Telephone Encounter (Signed)
Pt said she has fibromyalgia and at night her muscles tighten so she takes this to relax muscles. Pt said she might have gotten this from Dr Phylliss Bob. Pt will ck with [facility] to see if refilled. Pt said she will call for appt after 1st of year to see Dr Milinda Antis.

## 2011-03-19 NOTE — Telephone Encounter (Signed)
I do not see on her med list - please clarify with her if she wants it and what for? thanks

## 2011-04-27 ENCOUNTER — Encounter: Payer: Self-pay | Admitting: Family Medicine

## 2011-04-28 ENCOUNTER — Encounter: Payer: Self-pay | Admitting: Family Medicine

## 2011-04-28 ENCOUNTER — Ambulatory Visit (INDEPENDENT_AMBULATORY_CARE_PROVIDER_SITE_OTHER): Payer: Medicare Other | Admitting: Family Medicine

## 2011-04-28 VITALS — BP 100/64 | HR 72 | Temp 97.6°F | Ht 64.25 in | Wt 138.2 lb

## 2011-04-28 DIAGNOSIS — I10 Essential (primary) hypertension: Secondary | ICD-10-CM

## 2011-04-28 DIAGNOSIS — G43909 Migraine, unspecified, not intractable, without status migrainosus: Secondary | ICD-10-CM

## 2011-04-28 DIAGNOSIS — K2970 Gastritis, unspecified, without bleeding: Secondary | ICD-10-CM

## 2011-04-28 DIAGNOSIS — K2990 Gastroduodenitis, unspecified, without bleeding: Secondary | ICD-10-CM

## 2011-04-28 MED ORDER — SUMATRIPTAN SUCCINATE 100 MG PO TABS: 100.0000 mg | ORAL_TABLET | ORAL | Status: DC | PRN

## 2011-04-28 MED ORDER — AMITRIPTYLINE HCL 10 MG PO TABS: ORAL_TABLET | ORAL | Status: DC

## 2011-04-28 NOTE — Assessment & Plan Note (Signed)
Chronic/ recurrent/ triggered by stress and weather / lifelong - never been on prophylaxis and likely analgesic rebound Disc migraine in detail-given handouts Need to change ppi due to ha side eff from prilosec- she will call ins and let us know what is covered Disc lifestyle change  Trial of elavil 10 mg qhs - to inc weekly to eff dose that is not too sedating  refil imitrex and inc to 100 F/u 6-8 weeks  Update if not starting to improve in a week or if worsening   >25 min spent with face to face with patient, >50% counseling and/or coordinating care

## 2011-04-28 NOTE — Patient Instructions (Addendum)
I think the prilosec could worsen your headaches  Call your insurance and see if they will cover prevacid or dexilant or aciphex- let me know- these are reflux medicines with less potential for headaches -- we will change to one of those  Start elavil (amitriptilyne) 10 mg at bedtime for sleep and headaches- gradually increase by 10 mg per week until you reach a dose that helps sleep and anxiety and headaches without making you too sleepy the next day  Take imitrex when you need it  Drink lots of fluids/ avoid caff/ talk to your pastor  Follow up in 6-8 weeks

## 2011-04-28 NOTE — Assessment & Plan Note (Signed)
No longer a problem with good lifestyle

## 2011-04-28 NOTE — Progress Notes (Signed)
Subjective:    Patient ID: Sarah Young, female    DOB: July 18, 1940, 71 y.o.   MRN: [id number]  HPI Here for f/u of chronic med problems  incl headache Worse this week with the weather change Has headache right now- ran out of her imitrex  Off and on for a week worse due to extreme weather change   Usually has stress headaches- they turn into migraines only sometimes Having headaches 2-3 times per week - they do not all turn into migraines  - takes tylenol or sinus medicine (otc tylenol sinus )  Has had headaches all her life  Takes vicodin rarely for severe headaches  Never been on headache preventative drugs  Does not sleep well - wakes up at 4 am and cannot go back to sleep  Does not think she is depressed  Has a very bad family situation right now   She talks to her pastor about this    bp is  100/64   Today No cp or palpitations or headaches or edema  No medicines- is controlled by lifestyle at this point  Wt is down 7 lb with bmi of 23  Hx of high lipids Lab Results  Component Value Date   CHOL 218* 10/07/2009   HDL 76.90 10/07/2009   LDLDIRECT 125.7 10/07/2009   TRIG 85.0 10/07/2009   CHOLHDL 3 10/07/2009    Is on prilosec daily - this has potential side eff of headache Need to change to diff agent with less ha potential Does become symptomatic if she stops it - gastritis  Patient Active Problem List  Diagnoses  . POLIOMYELITIS  . VITAMIN D DEFICIENCY  . HYPERLIPIDEMIA  . ANXIETY  . MIGRAINE HEADACHE  . ESSENTIAL HYPERTENSION  . GASTRITIS  . CONTACT DERMATITIS  . OSTEOARTHRITIS, HANDS, BILATERAL  . FIBROMYALGIA  . OSTEOPENIA  . URINARY INCONTINENCE, STRESS, MILD   Past Medical History  Diagnosis Date  . Osteopenia   . Anxiety   . Hyperlipidemia   . Vitamin d deficiency   . Fibromyalgia   . Arthritis     OA of hands  . Migraine   . DVT (deep venous thrombosis) 1980s   Past Surgical History  Procedure Date  . Abdominal hysterectomy 1972  .  Ovarian cyst removed 1985  . Breast surgery     lumpectomy fibrocystic breast  . Cholecystectomy 2005   History  Substance Use Topics  . Smoking status: Never Smoker   . Smokeless tobacco: Not on file  . Alcohol Use: No   Family History  Problem Relation Age of Onset  . Hypertension Father   . Cancer Father     CA of stomach and intestines  . Cancer Brother     breast CA   Allergies  Allergen Reactions  . Amoxicillin     REACTION: reacts opposite effect made infection worse.  . Esomeprazole Magnesium     REACTION: Nausea  . WUJ:WJXBJYNWGNF+AOZHYQMVH+QIONGEXBMW Acid+Aspartame     REACTION: Felt like body turned inside out  . Omeprazole     REACTION: not effective  . Ranitidine Hcl     REACTION: not effective   Current Outpatient Prescriptions on File Prior to Visit  Medication Sig Dispense Refill  . calcitonin, salmon, (MIACALCIN/FORTICAL) 200 UNIT/ACT nasal spray One spray into each nostril daily, alternate nostrils  7.4 mL  1  . cyclobenzaprine (FLEXERIL) 10 MG tablet Take 10 mg by mouth 3 (three) times daily as needed.        Marland Kitchen  HYDROcodone-acetaminophen (VICODIN) 5-500 MG per tablet Take one tablet by mouth every 4-6 hours as needed for pain.  30 tablet  0  . ALPRAZolam (XANAX) 0.5 MG tablet Take 1 tablet by mouth up to twice a day as needed for severe anxiety  30 tablet  0  . meclizine (ANTIVERT) 25 MG tablet Take 1 tablet (25 mg total) by mouth 3 (three) times daily as needed for dizziness or nausea.  30 tablet  0      Review of Systems Review of Systems  Constitutional: Negative for fever, appetite change, fatigue and unexpected weight change.  Eyes: Negative for pain and visual disturbance.  Respiratory: Negative for cough and shortness of breath.   Cardiovascular: Negative for cp or palpitations    Gastrointestinal: Negative for nausea, diarrhea and constipation. no abd pain on PPI Genitourinary: Negative for urgency and frequency.  Skin: Negative for pallor  or rash   Neurological: Negative for weakness, light-headedness, numbness and pos for ha  Hematological: Negative for adenopathy. Does not bruise/bleed easily.  Psychiatric/Behavioral: Negative for dysphoric mood. Some anxiety symptoms from stress, some insomnia         Objective:   Physical Exam  Constitutional: She appears well-developed and well-nourished. No distress.  HENT:  Head: Normocephalic and atraumatic.  Right Ear: External ear normal.  Left Ear: External ear normal.  Nose: Nose normal.  Mouth/Throat: Oropharynx is clear and moist.       No sinus tenderness  No temporal tenderness  Eyes: Conjunctivae and EOM are normal. Pupils are equal, round, and reactive to light. No scleral icterus.  Neck: Normal range of motion. Neck supple. No JVD present. Carotid bruit is not present. No thyromegaly present.  Cardiovascular: Normal rate, regular rhythm, normal heart sounds and intact distal pulses.   No murmur heard. Pulmonary/Chest: Effort normal and breath sounds normal. No respiratory distress. She has no wheezes.  Abdominal: Soft. Bowel sounds are normal. She exhibits no distension, no abdominal bruit and no mass. There is no tenderness.  Musculoskeletal: She exhibits no edema.  Lymphadenopathy:    She has no cervical adenopathy.  Neurological: She is alert. She has normal reflexes. She displays no tremor. No cranial nerve deficit or sensory deficit. She exhibits normal muscle tone. She displays a negative Romberg sign. Coordination and gait normal.       No focal cerebellar signs   Skin: Skin is warm and dry. No rash noted. No erythema. No pallor.  Psychiatric: She has a normal mood and affect.          Assessment & Plan:

## 2011-04-29 ENCOUNTER — Other Ambulatory Visit: Payer: Self-pay | Admitting: Family Medicine

## 2011-04-29 MED ORDER — DEXLANSOPRAZOLE 60 MG PO CPDR: 60.0000 mg | DELAYED_RELEASE_CAPSULE | Freq: Every day | ORAL | Status: AC

## 2011-04-29 NOTE — Telephone Encounter (Signed)
Left v/m for pt to call back and verify pharmacy.

## 2011-04-29 NOTE — Assessment & Plan Note (Signed)
Need to change PPI due to ha  Will see what is affordable- see AVS

## 2011-04-29 NOTE — Telephone Encounter (Signed)
Spoke with pt and pt uses [facility]. Pt will ck with [facility] later today and if I need to call pt back pt said could leave detailed message on [phone].

## 2011-04-29 NOTE — Telephone Encounter (Signed)
Will go ahead and try dexilant Will refill electronically to [facility]

## 2011-04-29 NOTE — Telephone Encounter (Signed)
Pt stated that she checked with her insurance about her meds and Dexilant will be covered. They will only pay is she gets 30 mg or 60 mg.  Or call [phone]

## 2011-06-07 ENCOUNTER — Other Ambulatory Visit: Payer: Self-pay | Admitting: *Deleted

## 2011-06-07 MED ORDER — HYDROCODONE-ACETAMINOPHEN 5-500 MG PO TABS: ORAL_TABLET | ORAL | Status: DC

## 2011-06-07 NOTE — Telephone Encounter (Signed)
Received faxed refill request from pharmacy for Hydrocodone, last refill 02/27/11 #30. Is it okay to refill medication?

## 2011-06-07 NOTE — Telephone Encounter (Signed)
Px written for call in   

## 2011-06-08 NOTE — Telephone Encounter (Signed)
Medication phoned to [facility] as instructed.  

## 2011-06-16 ENCOUNTER — Ambulatory Visit: Payer: Medicare Other | Admitting: Family Medicine

## 2011-07-26 ENCOUNTER — Other Ambulatory Visit: Payer: Self-pay | Admitting: *Deleted

## 2011-07-26 MED ORDER — HYDROCODONE-ACETAMINOPHEN 5-500 MG PO TABS: ORAL_TABLET | ORAL | Status: DC

## 2011-07-26 NOTE — Telephone Encounter (Signed)
Px written for call in   

## 2011-07-26 NOTE — Telephone Encounter (Signed)
Received faxed refill request from pharmacy. Is it okay to refill medication? 

## 2011-07-26 NOTE — Telephone Encounter (Signed)
Rx called to pharmacy as instructed. 

## 2011-09-16 ENCOUNTER — Ambulatory Visit (INDEPENDENT_AMBULATORY_CARE_PROVIDER_SITE_OTHER): Payer: Medicare Other | Admitting: Family Medicine

## 2011-09-16 ENCOUNTER — Telehealth: Payer: Self-pay | Admitting: Family Medicine

## 2011-09-16 ENCOUNTER — Encounter: Payer: Self-pay | Admitting: Family Medicine

## 2011-09-16 VITALS — BP 142/70 | HR 72 | Temp 97.4°F | Wt 140.0 lb

## 2011-09-16 DIAGNOSIS — H6120 Impacted cerumen, unspecified ear: Secondary | ICD-10-CM

## 2011-09-16 DIAGNOSIS — H8110 Benign paroxysmal vertigo, unspecified ear: Secondary | ICD-10-CM

## 2011-09-16 MED ORDER — MECLIZINE HCL 25 MG PO TABS: 25.0000 mg | ORAL_TABLET | Freq: Three times a day (TID) | ORAL | Status: AC | PRN

## 2011-09-16 MED ORDER — PROMETHAZINE HCL 12.5 MG PO TABS: 12.5000 mg | ORAL_TABLET | Freq: Three times a day (TID) | ORAL | Status: DC | PRN

## 2011-09-16 NOTE — Progress Notes (Signed)
Subjective:    Patient ID: Sarah Young, female    DOB: 1940-05-18, 71 y.o.   MRN: [id number]  HPI  71 yo pt of Dr. Milinda Antis, new to me, here for sudden onset of vertigo.  Has had symptoms like this in past. Symptoms are most severe when she turns her head to the right- room spins, feels off balance and very nauseated.  She has not yet vomited. Has had some allergy symptoms (nasal congestion) lately.  No HA, blurred vision, ear pain or decreased hearing.  Patient Active Problem List  Diagnosis  . POLIOMYELITIS  . VITAMIN D DEFICIENCY  . HYPERLIPIDEMIA  . ANXIETY  . MIGRAINE HEADACHE  . ESSENTIAL HYPERTENSION  . GASTRITIS  . CONTACT DERMATITIS  . OSTEOARTHRITIS, HANDS, BILATERAL  . FIBROMYALGIA  . OSTEOPENIA  . URINARY INCONTINENCE, STRESS, MILD  . BPPV (benign paroxysmal positional vertigo)  . Cerumen impaction   Past Medical History  Diagnosis Date  . Osteopenia   . Anxiety   . Hyperlipidemia   . Vitamin d deficiency   . Fibromyalgia   . Arthritis     OA of hands  . Migraine   . DVT (deep venous thrombosis) 1980s   Past Surgical History  Procedure Date  . Abdominal hysterectomy 1972  . Ovarian cyst removed 1985  . Breast surgery     lumpectomy fibrocystic breast  . Cholecystectomy 2005   History  Substance Use Topics  . Smoking status: Never Smoker   . Smokeless tobacco: Not on file  . Alcohol Use: No   Family History  Problem Relation Age of Onset  . Hypertension Father   . Cancer Father     CA of stomach and intestines  . Cancer Brother     breast CA   Allergies  Allergen Reactions  . Amoxicillin     REACTION: reacts opposite effect made infection worse.  Marland Kitchen Amoxicillin-Pot Clavulanate     REACTION: Felt like body turned inside out  . Esomeprazole Magnesium     REACTION: Nausea  . Omeprazole     REACTION: not effective  . Ranitidine Hcl     REACTION: not effective   Current Outpatient Prescriptions on File Prior to Visit  Medication Sig  Dispense Refill  . ALPRAZolam (XANAX) 0.5 MG tablet Take 1 tablet by mouth up to twice a day as needed for severe anxiety  30 tablet  0  . amitriptyline (ELAVIL) 10 MG tablet Take as directed, start with 10 mg by mouth at bedtime and increase weekly as tolerated to maximum of 50  90 tablet  3  . calcitonin, salmon, (MIACALCIN/FORTICAL) 200 UNIT/ACT nasal spray One spray into each nostril daily, alternate nostrils  7.4 mL  1  . cyclobenzaprine (FLEXERIL) 10 MG tablet Take 10 mg by mouth 3 (three) times daily as needed.        . dicyclomine (BENTYL) 20 MG tablet Take 20 mg by mouth every 6 (six) hours.      . SUMAtriptan (IMITREX) 100 MG tablet Take 1 tablet (100 mg total) by mouth every 2 (two) hours as needed for migraine (maximum of 2 pills in one day).  10 tablet  11  . promethazine (PHENERGAN) 12.5 MG tablet Take 1 tablet (12.5 mg total) by mouth every 8 (eight) hours as needed for nausea.  20 tablet  0   The PMH, PSH, Social History, Family History, Medications, and allergies have been reviewed in [facility], and have been updated if relevant.   Review  of Systems No sensitivity to light No CP or SOB    Objective:   Physical Exam BP 142/70  Pulse 72  Temp 97.4 F (36.3 C)  Wt 140 lb (63.504 kg)  General:  Well-developed,well-nourished,in no acute distress; alert,appropriate and cooperative throughout examination Head:  normocephalic and atraumatic.   Eyes:  vision grossly intact, pupils equal, pupils round, and pupils reactive to light, pos mild bilateral nystagmus  Ears:  Right cerumen impaction, left TM normal Nose:  no external deformity.   Mouth:  good dentition.   Neck:  No deformities, masses, or tenderness noted. Msk:  No deformity or scoliosis noted of thoracic or lumbar spine.   Extremities:  No clubbing, cyanosis, edema, or deformity noted with normal full range of motion of all joints.   Neurologic:  alert & oriented X3 and gait normal.   Skin:  Intact without suspicious  lesions or rashes Psych:  Cognition and judgment appear intact. Alert and cooperative with normal attention span and concentration. No apparent delusions, illusions, hallucinations        Assessment & Plan:   1. BPPV (benign paroxysmal positional vertigo)  New- likely due to right cerumen impaction. Will treat with meclizine, phenergan and cerumen removal.  See below. See pt instructions for further details.  2. Cerumen impaction  Wax is removed by syringing and manual debridement. Instructions for home care to prevent wax buildup are given.

## 2011-09-16 NOTE — Telephone Encounter (Signed)
Caller: Sarah Young/Patient; PCP: Roxy Manns A.; CB#: [phone]; ; ; Call regarding Vertigo X2 Days. Onset 09/14/2011; is having some congestion.  Has had this before and is taking Dramamine which is not working.  Reports that it is positional.  Emergent s/sx of Dizziness or Vertigo,  Having sensations of turning or spinning that affects balance AND not responsive to 4 hours of home care identified.  Appt scheduled for 09/16/2011 with Dr. Clifton Custard at 10:45.  Care advice given.  Pt will have her granddaughter drive her to MD office.

## 2011-09-16 NOTE — Telephone Encounter (Signed)
Call Documentation     Sarah Young 09/16/2011 9:00 AM Signed  Caller: [name]; PCP: Roxy Manns A.; CB#: [phone]; ; ; Call regarding Vertigo X2 Days. Onset 09/14/2011; is having some congestion. Has had this before and is taking Dramamine which is not working. Reports that it is positional. Emergent s/sx of Dizziness or Vertigo, Having sensations of turning or spinning that affects balance AND not responsive to 4 hours of home care identified. Appt scheduled for 09/16/2011 with Dr. Clifton Custard at 10:45. Care advice given. Pt will have her granddaughter drive her to MD office.

## 2011-09-16 NOTE — Patient Instructions (Addendum)
Nice to meet you.  Take meclizine as directed for next 2 days, then as needed. You may also use phenergan as needed for nausea, be aware that they both make you sleepy. Please call us with an update tomorrow.  Vertigo Vertigo means you feel like you or your surroundings are moving when they are not. Vertigo can be dangerous if it occurs when you are at work, driving, or performing difficult activities.  CAUSES  Vertigo occurs when there is a conflict of signals sent to your brain from the visual and sensory systems in your body. There are many different causes of vertigo, including:  Infections, especially in the inner ear.   Wax build up in ear  A bad reaction to a drug or misuse of alcohol and medicines.   Withdrawal from drugs or alcohol.   Rapidly changing positions, such as lying down or rolling over in bed.   A migraine headache.   Decreased blood flow to the brain.   Increased pressure in the brain from a head injury, infection, tumor, or bleeding.  SYMPTOMS  You may feel as though the world is spinning around or you are falling to the ground. Because your balance is upset, vertigo can cause nausea and vomiting. You may have involuntary eye movements (nystagmus). DIAGNOSIS  Vertigo is usually diagnosed by physical exam. If the cause of your vertigo is unknown, your caregiver may perform imaging tests, such as an MRI scan (magnetic resonance imaging). TREATMENT  Most cases of vertigo resolve on their own, without treatment. Depending on the cause, your caregiver may prescribe certain medicines. If your vertigo is related to body position issues, your caregiver may recommend movements or procedures to correct the problem. In rare cases, if your vertigo is caused by certain inner ear problems, you may need surgery. HOME CARE INSTRUCTIONS   Follow your caregiver's instructions.   Avoid driving.   Avoid operating heavy machinery.   Avoid performing any tasks that would be  dangerous to you or others during a vertigo episode.   Tell your caregiver if you notice that certain medicines seem to be causing your vertigo. Some of the medicines used to treat vertigo episodes can actually make them worse in some people.  SEEK IMMEDIATE MEDICAL CARE IF:   Your medicines do not relieve your vertigo or are making it worse.   You develop problems with talking, walking, weakness, or using your arms, hands, or legs.   You develop severe headaches.   Your nausea or vomiting continues or gets worse.   You develop visual changes.   A family member notices behavioral changes.   Your condition gets worse.  MAKE SURE YOU:  Understand these instructions.   Will watch your condition.   Will get help right away if you are not doing well or get worse.  Document Released: 12/23/2004 Document Revised: 03/04/2011 Document Reviewed: 10/01/2010 [facility] Patient Information 2012 [place], [place].

## 2011-09-17 ENCOUNTER — Telehealth: Payer: Self-pay

## 2011-09-17 MED ORDER — AZITHROMYCIN 250 MG PO TABS: ORAL_TABLET | ORAL | Status: AC

## 2011-09-17 MED ORDER — FLUTICASONE PROPIONATE 50 MCG/ACT NA SUSP: 1.0000 | Freq: Every day | NASAL | Status: DC

## 2011-09-17 MED ORDER — PROMETHAZINE HCL 25 MG/ML IJ SOLN
25.0000 mg | Freq: Once | INTRAMUSCULAR | Status: AC
  Administered 2011-09-16: 25 mg via INTRAMUSCULAR

## 2011-09-17 NOTE — Telephone Encounter (Signed)
Ok to take imitrex if she feels this is her typical migraine symptoms. I would also continue the meclizine if it is helping as she clearly has signs and symptoms of vertigo. We can try some flonase to help with nasal congestion.  Ok to send rx to pharmacy if she is interested in trying this as well.

## 2011-09-17 NOTE — Telephone Encounter (Signed)
Pt seen 09/16/11;today pt slightly better; if turns head or looks down room spins, dizzy but no nausea. Today slight blurred vision on and off and sinus drainage; has pressure behind eyes with h/a mostly in forehead. No cough,fever or S/T. Should pt take Imitrex or what Dr Dayton Martes advise. [facility].

## 2011-09-17 NOTE — Addendum Note (Signed)
Addended by: Eliezer Bottom on: 09/17/2011 02:17 PM   Modules accepted: Orders

## 2011-09-17 NOTE — Telephone Encounter (Signed)
Rx for zpack and flonase sent to her pharmacy.

## 2011-09-17 NOTE — Telephone Encounter (Signed)
Advised patient

## 2011-09-17 NOTE — Telephone Encounter (Signed)
Advised patient.  She says these are not her usual migraine symptoms.  She thinks it's all sinus related.  States she has a lot of drainage, sinus pain.   She is ok with trying flonase but really thinks she needs something else as well.  [facility]ses [facility].

## 2011-09-28 ENCOUNTER — Other Ambulatory Visit: Payer: Self-pay | Admitting: *Deleted

## 2011-09-28 MED ORDER — SUMATRIPTAN SUCCINATE 100 MG PO TABS: 100.0000 mg | ORAL_TABLET | ORAL | Status: DC | PRN

## 2011-09-28 NOTE — Telephone Encounter (Signed)
Will refill electronically  

## 2011-09-28 NOTE — Telephone Encounter (Signed)
OK to refill

## 2011-10-07 ENCOUNTER — Other Ambulatory Visit: Payer: Self-pay | Admitting: Family Medicine

## 2011-10-08 ENCOUNTER — Other Ambulatory Visit: Payer: Self-pay

## 2011-10-08 NOTE — Telephone Encounter (Signed)
Px written for call in   

## 2011-10-08 NOTE — Telephone Encounter (Signed)
Called in Rx as directed.  

## 2011-11-02 ENCOUNTER — Encounter: Payer: Self-pay | Admitting: Family Medicine

## 2011-11-02 ENCOUNTER — Ambulatory Visit (INDEPENDENT_AMBULATORY_CARE_PROVIDER_SITE_OTHER): Payer: Medicare Other | Admitting: Family Medicine

## 2011-11-02 VITALS — BP 122/66 | HR 79 | Temp 97.8°F | Wt 141.8 lb

## 2011-11-02 DIAGNOSIS — B029 Zoster without complications: Secondary | ICD-10-CM

## 2011-11-02 MED ORDER — VALACYCLOVIR HCL 1 G PO TABS: 1000.0000 mg | ORAL_TABLET | Freq: Three times a day (TID) | ORAL | Status: DC

## 2011-11-02 NOTE — Patient Instructions (Addendum)
Take the valtrex as directed  See the eye doctor today and have them send me records  Call if you think you need pain medicines  Keep me updated

## 2011-11-02 NOTE — Progress Notes (Signed)
Subjective:    Patient ID: Sarah Young, female    DOB: [birth year], 71 y.o.   MRN: [id number]  HPI Here for some poison ivy  It started on her L jaw and chin area - then went up to forehead and also around eye  Also on her shoulder and chest   She had the shingles shot   This is intensely itchy and also burns a bit   She made a paste out of baking soda -helped a bit   Eye feels a little sore   Over the weekend her husband was cutting limbs - and she washed his clothes and touched them  He did not get poison ivy  Patient Active Problem List  Diagnosis  . POLIOMYELITIS  . VITAMIN D DEFICIENCY  . HYPERLIPIDEMIA  . ANXIETY  . MIGRAINE HEADACHE  . ESSENTIAL HYPERTENSION  . GASTRITIS  . CONTACT DERMATITIS  . OSTEOARTHRITIS, HANDS, BILATERAL  . FIBROMYALGIA  . OSTEOPENIA  . URINARY INCONTINENCE, STRESS, MILD  . BPPV (benign paroxysmal positional vertigo)  . Cerumen impaction   Past Medical History  Diagnosis Date  . Osteopenia   . Anxiety   . Hyperlipidemia   . Vitamin d deficiency   . Fibromyalgia   . Arthritis     OA of hands  . Migraine   . DVT (deep venous thrombosis) 1980s   Past Surgical History  Procedure Date  . Abdominal hysterectomy 1972  . Ovarian cyst removed 1985  . Breast surgery     lumpectomy fibrocystic breast  . Cholecystectomy 2005   History  Substance Use Topics  . Smoking status: Never Smoker   . Smokeless tobacco: Not on file  . Alcohol Use: No   Family History  Problem Relation Age of Onset  . Hypertension Father   . Cancer Father     CA of stomach and intestines  . Cancer Brother     breast CA   Allergies  Allergen Reactions  . Amoxicillin     REACTION: reacts opposite effect made infection worse.  Marland Kitchen Amoxicillin-Pot Clavulanate     REACTION: Felt like body turned inside out  . Esomeprazole Magnesium     REACTION: Nausea  . Omeprazole     REACTION: not effective  . Ranitidine Hcl     REACTION: not effective   Current  Outpatient Prescriptions on File Prior to Visit  Medication Sig Dispense Refill  . ALPRAZolam (XANAX) 0.5 MG tablet Take 1 tablet by mouth up to twice a day as needed for severe anxiety  30 tablet  0  . amitriptyline (ELAVIL) 10 MG tablet Take as directed, start with 10 mg by mouth at bedtime and increase weekly as tolerated to maximum of 50  90 tablet  3  . calcitonin, salmon, (MIACALCIN/FORTICAL) 200 UNIT/ACT nasal spray One spray into each nostril daily, alternate nostrils  7.4 mL  1  . cyclobenzaprine (FLEXERIL) 10 MG tablet Take 10 mg by mouth 3 (three) times daily as needed.        . dicyclomine (BENTYL) 20 MG tablet Take 20 mg by mouth every 6 (six) hours.      . fluticasone (FLONASE) 50 MCG/ACT nasal spray Place 1 spray into the nose daily.  16 g  0  . HYDROcodone-acetaminophen (VICODIN) 5-500 MG per tablet TAKE 1 TABLET BY MOUTH UP TO EVERY 4-6  HOURS AS NEEDED FOR PAIN  30 tablet  0  . SUMAtriptan (IMITREX) 100 MG tablet Take 1 tablet (100 mg total)  by mouth every 2 (two) hours as needed for migraine (maximum of 2 pills in one day).  10 tablet  11  . DISCONTD: promethazine (PHENERGAN) 12.5 MG tablet Take 1 tablet (12.5 mg total) by mouth every 8 (eight) hours as needed for nausea.  20 tablet  0      Review of Systems Review of Systems  Constitutional: Negative for fever, appetite change, fatigue and unexpected weight change.  Eyes: Negative for pain and visual disturbance.  Respiratory: Negative for cough and shortness of breath.   Cardiovascular: Negative for cp or palpitations    Gastrointestinal: Negative for nausea, diarrhea and constipation.  Genitourinary: Negative for urgency and frequency.  Skin: Negative for pallor and pos for rash - that itches and burns  Neurological: Negative for weakness, light-headedness, numbness and headaches.  Hematological: Negative for adenopathy. Does not bruise/bleed easily.  Psychiatric/Behavioral: Negative for dysphoric mood. The patient is  not nervous/anxious.         Objective:   Physical Exam  Constitutional: She appears well-developed and well-nourished. No distress.  HENT:  Head: Normocephalic and atraumatic.  Right Ear: External ear normal.  Left Ear: External ear normal.  Nose: Nose normal.  Mouth/Throat: Oropharynx is clear and moist.  Eyes: Conjunctivae and EOM are normal. Pupils are equal, round, and reactive to light. Right eye exhibits no discharge. Left eye exhibits no discharge. No scleral icterus.       Rash around eye - no conj or vision change  Neck: Normal range of motion. Neck supple. No thyromegaly present.  Cardiovascular: Normal rate.   Pulmonary/Chest: Effort normal and breath sounds normal. She has no wheezes.  Musculoskeletal: She exhibits no edema.  Lymphadenopathy:    She has no cervical adenopathy.  Neurological: She is alert. She has normal reflexes. No cranial nerve deficit.  Skin: Skin is warm and dry. Rash noted. There is erythema.       Rash on L side of face and jaw- clusters of vesicles with redness Eye looks ok but rash surrounds it  No nasal involvement  Psychiatric: She has a normal mood and affect.          Assessment & Plan:

## 2011-11-04 ENCOUNTER — Telehealth: Payer: Self-pay

## 2011-11-04 MED ORDER — HYDROCODONE-ACETAMINOPHEN 5-500 MG PO TABS: 1.0000 | ORAL_TABLET | ORAL | Status: DC | PRN

## 2011-11-04 NOTE — Telephone Encounter (Signed)
Rx called into [facility]. 

## 2011-11-04 NOTE — Telephone Encounter (Addendum)
Pt dx with shingles on 11/02/11; pt has migraine today and wants to know if can take generic imitrex with valtrex. Pt also request refill Hydrocodone for shingles pain.[facility].Please advise.

## 2011-11-04 NOTE — Telephone Encounter (Signed)
It is ok to take imitrex Px written for call in  For pain med

## 2011-11-07 NOTE — Assessment & Plan Note (Signed)
Vesicular rash on L side of face and jaw only- resembles herpes zoster (pt has had the vaccine) Will tx with valtrex 1 g tid for 7 d Also direct ref now to opthy due to close proximity to eye (thankfully no eye complaints)

## 2011-11-15 ENCOUNTER — Other Ambulatory Visit: Payer: Self-pay | Admitting: Family Medicine

## 2011-11-15 NOTE — Telephone Encounter (Signed)
Request for cyclobenzaprine 10mg . Ok to refill?

## 2011-11-15 NOTE — Telephone Encounter (Signed)
Will refill electronically  

## 2012-01-11 ENCOUNTER — Other Ambulatory Visit: Payer: Self-pay | Admitting: Family Medicine

## 2012-01-11 DIAGNOSIS — Z1231 Encounter for screening mammogram for malignant neoplasm of breast: Secondary | ICD-10-CM

## 2012-01-14 ENCOUNTER — Other Ambulatory Visit: Payer: Self-pay | Admitting: Family Medicine

## 2012-01-14 NOTE — Telephone Encounter (Signed)
Thanks for the update - I hope she keeps feeling better  Px written for call in

## 2012-01-14 NOTE — Telephone Encounter (Signed)
Pt said she is having pain a lot in her shoulder up to her neck because of her fibromyalgia but the shingles are clearing up but still there, pt has a f/u with her opthalmologic in Nov. To recheck eyes for shingles

## 2012-01-14 NOTE — Telephone Encounter (Signed)
Called in Rx as prescribed, notified pt Rx was called in

## 2012-01-14 NOTE — Telephone Encounter (Signed)
Ok to refill 

## 2012-01-14 NOTE — Telephone Encounter (Signed)
Before I fill these- please call pt and ask how her symptoms are-thanks

## 2012-02-17 ENCOUNTER — Ambulatory Visit
Admission: RE | Admit: 2012-02-17 | Discharge: 2012-02-17 | Disposition: A | Discharge status: Home / Self Care | Payer: Medicare Other | Source: Ambulatory Visit | Attending: Family Medicine | Admitting: Family Medicine

## 2012-02-17 DIAGNOSIS — Z1231 Encounter for screening mammogram for malignant neoplasm of breast: Secondary | ICD-10-CM

## 2012-02-18 ENCOUNTER — Encounter: Payer: Self-pay | Admitting: *Deleted

## 2012-04-26 ENCOUNTER — Telehealth: Payer: Self-pay | Admitting: *Deleted

## 2012-04-26 NOTE — Telephone Encounter (Signed)
Done and in IN box 

## 2012-04-26 NOTE — Telephone Encounter (Signed)
Received prior auth request for amitriptyline, forms in your inbox.

## 2012-04-27 NOTE — Telephone Encounter (Signed)
Approval letter for amitriptyline received; [facility] notified and letter place on Dr Royden Purl shelf for signature and scanning.

## 2012-04-27 NOTE — Telephone Encounter (Signed)
Prior auth faxed

## 2012-07-20 ENCOUNTER — Other Ambulatory Visit: Payer: Self-pay | Admitting: Family Medicine

## 2012-07-20 NOTE — Telephone Encounter (Signed)
Please give her 12 mo of refils

## 2012-07-20 NOTE — Telephone Encounter (Signed)
Ok to refill 

## 2012-08-10 ENCOUNTER — Other Ambulatory Visit: Payer: Self-pay | Admitting: *Deleted

## 2012-08-10 MED ORDER — FLUTICASONE PROPIONATE 50 MCG/ACT NA SUSP: 1.0000 | Freq: Every day | NASAL | Status: DC

## 2012-10-18 ENCOUNTER — Encounter: Payer: Self-pay | Admitting: Family Medicine

## 2012-10-18 ENCOUNTER — Ambulatory Visit (INDEPENDENT_AMBULATORY_CARE_PROVIDER_SITE_OTHER): Payer: Medicare Other | Admitting: Family Medicine

## 2012-10-18 VITALS — BP 126/68 | HR 67 | Temp 98.5°F | Ht 64.25 in | Wt 143.8 lb

## 2012-10-18 DIAGNOSIS — S70369A Insect bite (nonvenomous), unspecified thigh, initial encounter: Secondary | ICD-10-CM | POA: Insufficient documentation

## 2012-10-18 DIAGNOSIS — S90569A Insect bite (nonvenomous), unspecified ankle, initial encounter: Secondary | ICD-10-CM

## 2012-10-18 DIAGNOSIS — R5383 Other fatigue: Secondary | ICD-10-CM | POA: Insufficient documentation

## 2012-10-18 DIAGNOSIS — R5381 Other malaise: Secondary | ICD-10-CM | POA: Insufficient documentation

## 2012-10-18 DIAGNOSIS — S70361A Insect bite (nonvenomous), right thigh, initial encounter: Secondary | ICD-10-CM

## 2012-10-18 MED ORDER — DOXYCYCLINE HYCLATE 100 MG PO TABS: 100.0000 mg | ORAL_TABLET | Freq: Two times a day (BID) | ORAL | Status: DC

## 2012-10-18 NOTE — Progress Notes (Signed)
Subjective:    Patient ID: Sarah Young, female    DOB: March 19, 1941, 72 y.o.   MRN: [id number]  HPI Here for a tick bite  Bitten around July 6-7th  She found it within 2 days- had an itch on R inner thigh  She got the whole thing off and out with tweezers  There was redness around it  She developed a red/ indurated area several cm in diameter - does not think there was a bullseye pattern   No rash on body Has not been feeling well  Last Sunday- had an unusual headache that would not go away (even with migraine med)  Felt "bad" all week and tired -wants to stay in bed  achey in general (but she has fibro) Knees and ankles hurt -baseline -no swelling  Has not taken temp but had chills one day  Tylenol helped  Felt some vertigo yesterday and took some meclizine and that helped   Patient Active Problem List   Diagnosis Date Noted  . Shingles 11/02/2011  . BPPV (benign paroxysmal positional vertigo) 09/16/2011  . Cerumen impaction 09/16/2011  . CONTACT DERMATITIS 02/10/2010  . GASTRITIS 11/24/2009  . ESSENTIAL HYPERTENSION 10/02/2008  . MIGRAINE HEADACHE 09/06/2008  . VITAMIN D DEFICIENCY 06/27/2007  . HYPERLIPIDEMIA 05/11/2007  . ANXIETY 05/11/2007  . OSTEOPENIA 05/11/2007  . POLIOMYELITIS 05/08/2007  . OSTEOARTHRITIS, HANDS, BILATERAL 05/08/2007  . FIBROMYALGIA 05/08/2007  . URINARY INCONTINENCE, STRESS, MILD 05/08/2007   Past Medical History  Diagnosis Date  . Osteopenia   . Anxiety   . Hyperlipidemia   . Vitamin D deficiency   . Fibromyalgia   . Arthritis     OA of hands  . Migraine   . DVT (deep venous thrombosis) 1980s   Past Surgical History  Procedure Laterality Date  . Abdominal hysterectomy  1972  . Ovarian cyst removed  1985  . Breast surgery      lumpectomy fibrocystic breast  . Cholecystectomy  2005   History  Substance Use Topics  . Smoking status: Never Smoker   . Smokeless tobacco: Not on file  . Alcohol Use: No   Family History  Problem  Relation Age of Onset  . Hypertension Father   . Cancer Father     CA of stomach and intestines  . Cancer Brother     breast CA   Allergies  Allergen Reactions  . Amoxicillin     REACTION: reacts opposite effect made infection worse.  Marland Kitchen Amoxicillin-Pot Clavulanate     REACTION: Felt like body turned inside out  . Esomeprazole Magnesium     REACTION: Nausea  . Omeprazole     REACTION: not effective  . Ranitidine Hcl     REACTION: not effective   Current Outpatient Prescriptions on File Prior to Visit  Medication Sig Dispense Refill  . calcitonin, salmon, (MIACALCIN/FORTICAL) 200 UNIT/ACT nasal spray One spray into each nostril daily, alternate nostrils  7.4 mL  1  . dicyclomine (BENTYL) 20 MG tablet Take 20 mg by mouth every 6 (six) hours.      . fluticasone (FLONASE) 50 MCG/ACT nasal spray Place 1 spray into the nose daily.  16 g  3  . LIDODERM 5 % PLACE 1 PATCH ONTO SKIN DAILY *REMOVE   AND DISCARD PATCH WITHIN 12 HOURS OR AS DIRECTED BY DOCTOR*  30 each  3  . promethazine (PHENERGAN) 12.5 MG tablet Take 12.5 mg by mouth every 8 (eight) hours as needed.      Marland Kitchen  SUMAtriptan (IMITREX) 100 MG tablet Take 1 tablet (100 mg total) by mouth every 2 (two) hours as needed for migraine (maximum of 2 pills in one day).  10 tablet  11  . ALPRAZolam (XANAX) 0.5 MG tablet Take 1 tablet by mouth up to twice a day as needed for severe anxiety  30 tablet  0  . cyclobenzaprine (FLEXERIL) 10 MG tablet TAKE ONE TABLET AT BEDTIME DAILY AS     NEEDED FOR MUSCLE SPASMS  30 tablet  5   No current facility-administered medications on file prior to visit.    Review of Systems Review of Systems  Constitutional: Negative for fever (today), appetite change,  and unexpected weight change.  ENT pos for post nasal drip and neg for facial pain or st  Eyes: Negative for pain and visual disturbance.  Respiratory: Negative for wheeze  and shortness of breath.  pos for mild cough  Cardiovascular: Negative for cp  or palpitations    Gastrointestinal: Negative for nausea, diarrhea and constipation.  Genitourinary: Negative for urgency and frequency.  Skin: Negative for pallor or rash   Neurological: Negative for weakness, light-headedness, numbness and headaches.  Hematological: Negative for adenopathy. Does not bruise/bleed easily.  Psychiatric/Behavioral: Negative for dysphoric mood. The patient is not nervous/anxious.         Objective:   Physical Exam  Constitutional: She appears well-developed and well-nourished. No distress.  HENT:  Head: Normocephalic and atraumatic.  Right Ear: External ear normal.  Left Ear: External ear normal.  Nose: Nose normal.  Mouth/Throat: Oropharynx is clear and moist.  Nares are boggy No facial tenderness   Eyes: Conjunctivae and EOM are normal. Pupils are equal, round, and reactive to light. Right eye exhibits no discharge. Left eye exhibits no discharge. No scleral icterus.  Neck: Normal range of motion. Neck supple. No JVD present. Carotid bruit is not present. No thyromegaly present.  Cardiovascular: Normal rate, regular rhythm, normal heart sounds and intact distal pulses.  Exam reveals no gallop.   Pulmonary/Chest: Effort normal and breath sounds normal. No respiratory distress. She has no wheezes. She has no rales.  Abdominal: Soft. Bowel sounds are normal. She exhibits no distension, no abdominal bruit and no mass. There is no tenderness.  Musculoskeletal: She exhibits no edema and no tenderness.  No acute joint swelling or changes   Lymphadenopathy:    She has no cervical adenopathy.  Neurological: She is alert. She has normal reflexes. No cranial nerve deficit. She exhibits normal muscle tone. Coordination normal.  Skin: Skin is warm and dry. No rash noted. No erythema. No pallor.  Small insect bite on R inner thigh 1-2 mm scab/ no retained tick and no redness   Psychiatric: She has a normal mood and affect.  Seems fatigued but mood is good and pt  is pleasant           Assessment & Plan:

## 2012-10-18 NOTE — Patient Instructions (Addendum)
Tick labs today  Take the doxycycline as directed  If rash or other symptoms develop - let me know  Take it easy and drink fluids Tylenol is ok as needed

## 2012-10-19 LAB — ROCKY MTN SPOTTED FVR ABS PNL(IGG+IGM): RMSF IgG: 0.89 IV — ABNORMAL HIGH

## 2012-10-19 NOTE — Assessment & Plan Note (Signed)
May be assoc with prev tick bite Lab today for tick rel illnesses Cover with doxy Update if not starting to improve in a week or if worsening

## 2012-10-19 NOTE — Assessment & Plan Note (Signed)
Without rash or suspicious looking bite site-but now developing constitutional symptoms  Lyme or STARI are possible  Cover with doxycyline and labs drawn Update if not starting to improve in a week or if worsening

## 2012-11-11 ENCOUNTER — Other Ambulatory Visit: Payer: Self-pay | Admitting: Family Medicine

## 2012-11-13 MED ORDER — HYDROCODONE-ACETAMINOPHEN 5-325 MG PO TABS: 1.0000 | ORAL_TABLET | Freq: Four times a day (QID) | ORAL | Status: DC | PRN

## 2012-11-13 NOTE — Telephone Encounter (Signed)
Electronic refill request, please advise  

## 2012-11-13 NOTE — Telephone Encounter (Signed)
Sorry- I changed that  Px written for call in

## 2012-11-13 NOTE — Telephone Encounter (Signed)
Px written for call in   

## 2012-11-13 NOTE — Telephone Encounter (Signed)
[facility] advise that this strength has been d/c, they can do norco 5/325, please advise

## 2012-11-14 NOTE — Telephone Encounter (Signed)
Rx called in as prescribed 

## 2012-11-30 ENCOUNTER — Other Ambulatory Visit: Payer: Self-pay | Admitting: Family Medicine

## 2012-11-30 NOTE — Telephone Encounter (Signed)
Please refill for a year thanks 

## 2012-11-30 NOTE — Telephone Encounter (Signed)
Electronic refill request, please advise  

## 2012-11-30 NOTE — Telephone Encounter (Signed)
Received refill request electronically from pharmacy. Last office visit 10/18/12. Is it okay to refill medication?

## 2012-11-30 NOTE — Telephone Encounter (Signed)
Please refill times 3 

## 2012-12-25 ENCOUNTER — Other Ambulatory Visit: Payer: Self-pay | Admitting: Family Medicine

## 2012-12-25 NOTE — Telephone Encounter (Signed)
Electronic refill request, please advise  

## 2012-12-25 NOTE — Telephone Encounter (Signed)
Px written for call in   

## 2012-12-25 NOTE — Telephone Encounter (Signed)
Rx called in as prescribed 

## 2012-12-27 ENCOUNTER — Other Ambulatory Visit: Payer: Self-pay

## 2013-02-26 ENCOUNTER — Other Ambulatory Visit: Payer: Self-pay | Admitting: Family Medicine

## 2013-02-26 NOTE — Telephone Encounter (Signed)
Please schedule her annual exam for early summer and refill until then thanks

## 2013-02-26 NOTE — Telephone Encounter (Signed)
Electronic refill request, please advise  

## 2013-03-02 NOTE — Telephone Encounter (Signed)
Left voicemail requesting pt to call office and schedule a CPE

## 2013-03-08 ENCOUNTER — Other Ambulatory Visit: Payer: Self-pay

## 2013-03-08 DIAGNOSIS — Z1231 Encounter for screening mammogram for malignant neoplasm of breast: Secondary | ICD-10-CM

## 2013-03-12 ENCOUNTER — Other Ambulatory Visit: Payer: Self-pay | Admitting: Family Medicine

## 2013-03-13 NOTE — Telephone Encounter (Signed)
I sent it electronically  

## 2013-03-13 NOTE — Telephone Encounter (Signed)
Electronic refill request, please advise  

## 2013-04-13 ENCOUNTER — Ambulatory Visit
Admission: RE | Admit: 2013-04-13 | Discharge: 2013-04-13 | Disposition: A | Discharge status: Home / Self Care | Payer: Medicare Other | Source: Ambulatory Visit

## 2013-04-13 DIAGNOSIS — Z1231 Encounter for screening mammogram for malignant neoplasm of breast: Secondary | ICD-10-CM

## 2013-04-16 ENCOUNTER — Encounter: Payer: Self-pay | Admitting: *Deleted

## 2013-04-20 ENCOUNTER — Encounter: Payer: Self-pay | Admitting: Family Medicine

## 2013-04-20 ENCOUNTER — Ambulatory Visit (INDEPENDENT_AMBULATORY_CARE_PROVIDER_SITE_OTHER): Payer: Medicare Other | Admitting: Family Medicine

## 2013-04-20 VITALS — BP 138/70 | HR 71 | Temp 97.5°F | Ht 64.25 in | Wt 146.0 lb

## 2013-04-20 DIAGNOSIS — E782 Mixed hyperlipidemia: Secondary | ICD-10-CM

## 2013-04-20 DIAGNOSIS — IMO0001 Reserved for inherently not codable concepts without codable children: Secondary | ICD-10-CM

## 2013-04-20 DIAGNOSIS — R5383 Other fatigue: Secondary | ICD-10-CM

## 2013-04-20 DIAGNOSIS — I10 Essential (primary) hypertension: Secondary | ICD-10-CM

## 2013-04-20 DIAGNOSIS — Z23 Encounter for immunization: Secondary | ICD-10-CM

## 2013-04-20 DIAGNOSIS — F411 Generalized anxiety disorder: Secondary | ICD-10-CM

## 2013-04-20 DIAGNOSIS — E559 Vitamin D deficiency, unspecified: Secondary | ICD-10-CM

## 2013-04-20 DIAGNOSIS — R5381 Other malaise: Secondary | ICD-10-CM

## 2013-04-20 DIAGNOSIS — G43909 Migraine, unspecified, not intractable, without status migrainosus: Secondary | ICD-10-CM

## 2013-04-20 LAB — LIPID PANEL
CHOLESTEROL: 242 mg/dL — AB (ref 0–200)
HDL: 86.4 mg/dL (ref >39.00)
TRIGLYCERIDES: 79 mg/dL (ref 0.0–149.0)
Total CHOL/HDL Ratio: 3
VLDL: 15.8 mg/dL (ref 0.0–40.0)

## 2013-04-20 LAB — CBC WITH DIFFERENTIAL/PLATELET
BASOS PCT: 0.7 % (ref 0.0–3.0)
Basophils Absolute: 0.1 10*3/uL (ref 0.0–0.1)
Eosinophils Absolute: 0.1 10*3/uL (ref 0.0–0.7)
Eosinophils Relative: 0.7 % (ref 0.0–5.0)
HCT: 41.3 % (ref 36.0–46.0)
HEMOGLOBIN: 13.7 g/dL (ref 12.0–15.0)
LYMPHS PCT: 33.1 % (ref 12.0–46.0)
Lymphs Abs: 2.5 10*3/uL (ref 0.7–4.0)
MCHC: 33.3 g/dL (ref 30.0–36.0)
MCV: 87.8 fl (ref 78.0–100.0)
MONOS PCT: 6.5 % (ref 3.0–12.0)
Monocytes Absolute: 0.5 10*3/uL (ref 0.1–1.0)
Neutro Abs: 4.4 10*3/uL (ref 1.4–7.7)
Neutrophils Relative %: 59 % (ref 43.0–77.0)
Platelets: 253 10*3/uL (ref 150.0–400.0)
RBC: 4.7 Mil/uL (ref 3.87–5.11)
RDW: 15.1 % — ABNORMAL HIGH (ref 11.5–14.6)
WBC: 7.4 10*3/uL (ref 4.5–10.5)

## 2013-04-20 LAB — COMPREHENSIVE METABOLIC PANEL
ALBUMIN: 3.8 g/dL (ref 3.5–5.2)
ALT: 15 U/L (ref 0–35)
AST: 23 U/L (ref 0–37)
Alkaline Phosphatase: 61 U/L (ref 39–117)
BUN: 20 mg/dL (ref 6–23)
CALCIUM: 9.8 mg/dL (ref 8.4–10.5)
CHLORIDE: 103 meq/L (ref 96–112)
CO2: 32 meq/L (ref 19–32)
Creatinine, Ser: 1.2 mg/dL (ref 0.4–1.2)
GFR: 45.92 mL/min — AB (ref >60.00)
Glucose, Bld: 88 mg/dL (ref 70–99)
Potassium: 4.5 mEq/L (ref 3.5–5.1)
Sodium: 140 mEq/L (ref 135–145)
Total Bilirubin: 0.7 mg/dL (ref 0.3–1.2)
Total Protein: 7.1 g/dL (ref 6.0–8.3)

## 2013-04-20 LAB — LDL CHOLESTEROL, DIRECT: Direct LDL: 143.9 mg/dL

## 2013-04-20 LAB — TSH: TSH: 2.1 u[IU]/mL (ref 0.35–5.50)

## 2013-04-20 MED ORDER — ALPRAZOLAM 0.5 MG PO TABS
ORAL_TABLET | ORAL | Status: DC
Start: 2013-04-20 — End: 2014-05-27

## 2013-04-20 MED ORDER — SUMATRIPTAN SUCCINATE 100 MG PO TABS: ORAL_TABLET | ORAL | Status: DC

## 2013-04-20 NOTE — Progress Notes (Signed)
Pre-visit discussion using our clinic review tool. No additional management support is needed unless otherwise documented below in the visit note.  

## 2013-04-20 NOTE — Patient Instructions (Addendum)
Flu vaccine today  Labs today  Keep talking to your church community / family about stress- if you want a counseling referral just let me know  Stay physically active and try to minimize stress the best you can  Call pharmacies about cost of amitriptyline
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
## 2013-04-21 LAB — VITAMIN D 25 HYDROXY (VIT D DEFICIENCY, FRACTURES): Vit D, 25-Hydroxy: 42 ng/mL (ref 30–89)

## 2013-04-22 NOTE — Assessment & Plan Note (Signed)
This is controlled by lifestyle  Overall stable even in the face of stressors

## 2013-04-22 NOTE — Assessment & Plan Note (Signed)
Lipid panel today  Rev low sat fat diet   

## 2013-04-22 NOTE — Assessment & Plan Note (Signed)
Suspect rel to stressors and fibromyalgia  Lab today Offered counseling- pt has good support

## 2013-04-22 NOTE — Assessment & Plan Note (Signed)
Overall stable on current meds- refilled

## 2013-04-22 NOTE — Assessment & Plan Note (Signed)
Rev stress rxn and stressors Reviewed stressors/ coping techniques/symptoms/ support sources/ tx options and side effects in detail today  For now - pt wants to stay the course Counseling offered at any time

## 2013-04-22 NOTE — Assessment & Plan Note (Signed)
Lab today  Disc imp to bone and overall health 

## 2013-04-22 NOTE — Assessment & Plan Note (Signed)
Worse during times of stress Pt remains active -disc plan for exercise  Having problems getting ins to pay for amitriptyline - adv to call pharmacies and price compare - this is generic and out of pocket cost may not be that much  It has helped her symptoms and ha and sleep

## 2013-04-23 ENCOUNTER — Encounter: Payer: Self-pay | Admitting: *Deleted

## 2013-05-02 ENCOUNTER — Telehealth: Payer: Self-pay | Admitting: Family Medicine

## 2013-05-02 NOTE — Telephone Encounter (Signed)
Relevant patient education mailed to patient.  

## 2013-06-20 ENCOUNTER — Other Ambulatory Visit: Payer: Self-pay | Admitting: Family Medicine

## 2013-06-20 NOTE — Telephone Encounter (Signed)
Electronic refill request, please advise  

## 2013-06-20 NOTE — Telephone Encounter (Signed)
Please refill for a year  

## 2013-07-04 ENCOUNTER — Other Ambulatory Visit: Payer: Self-pay | Admitting: Family Medicine

## 2013-07-04 NOTE — Telephone Encounter (Signed)
Electronic refill request, please advise (Dr. Glori Bickers out of office)

## 2013-07-04 NOTE — Telephone Encounter (Signed)
Printed and placed in my outbox.

## 2013-07-04 NOTE — Telephone Encounter (Signed)
Voicemail left on home phone to notify pt Rx is ready for pick up.

## 2013-07-05 ENCOUNTER — Encounter: Payer: Self-pay | Admitting: Family Medicine

## 2013-08-03 ENCOUNTER — Telehealth: Payer: Self-pay

## 2013-08-03 NOTE — Telephone Encounter (Signed)
Pt left v/m requesting status of lidoderm patch prior auth; spoke with Rob at [place]; [place] faxed to [phone] on 07/27/13; advised did not see in pt's chart and Rob will refax to [phone].

## 2013-08-06 NOTE — Telephone Encounter (Signed)
Formed received and placed in your inbox

## 2013-08-07 NOTE — Telephone Encounter (Signed)
PA faxed

## 2013-08-07 NOTE — Telephone Encounter (Signed)
Done and in IN box 

## 2013-08-08 ENCOUNTER — Encounter: Payer: Self-pay | Admitting: Family Medicine

## 2013-08-08 NOTE — Telephone Encounter (Signed)
PA was declined, letter placed in your inbox

## 2013-08-14 ENCOUNTER — Telehealth: Payer: Self-pay | Admitting: Family Medicine

## 2013-08-14 NOTE — Telephone Encounter (Signed)
Left voicemail letting pt know if she wants to do the appeal she needs to call and schedule a f/u with Dr. Glori Bickers so we can discuss it in detail

## 2013-08-14 NOTE — Telephone Encounter (Signed)
Pt faxed over the PA denial letter they sent her and a request that you write an appeal letter to try to appeal the denial of the lidocaine patches, letter placed in your inbox

## 2013-08-14 NOTE — Telephone Encounter (Signed)
I do not think we would be able to get an appeal approved since we were using it for an "off label" reason  If she wants to try however-please follow up with me so we can discuss it and put together the appeal - I will need more specific info

## 2013-08-15 ENCOUNTER — Ambulatory Visit (INDEPENDENT_AMBULATORY_CARE_PROVIDER_SITE_OTHER): Payer: Medicare Other | Admitting: Family Medicine

## 2013-08-15 ENCOUNTER — Encounter: Payer: Self-pay | Admitting: Family Medicine

## 2013-08-15 VITALS — BP 130/68 | HR 72 | Temp 98.1°F | Ht 64.25 in | Wt 146.0 lb

## 2013-08-15 DIAGNOSIS — G8929 Other chronic pain: Secondary | ICD-10-CM

## 2013-08-15 DIAGNOSIS — M898X1 Other specified disorders of bone, shoulder: Secondary | ICD-10-CM

## 2013-08-15 DIAGNOSIS — M5137 Other intervertebral disc degeneration, lumbosacral region: Secondary | ICD-10-CM

## 2013-08-15 DIAGNOSIS — IMO0001 Reserved for inherently not codable concepts without codable children: Secondary | ICD-10-CM

## 2013-08-15 DIAGNOSIS — M899 Disorder of bone, unspecified: Secondary | ICD-10-CM

## 2013-08-15 DIAGNOSIS — M949 Disorder of cartilage, unspecified: Secondary | ICD-10-CM

## 2013-08-15 DIAGNOSIS — M5136 Other intervertebral disc degeneration, lumbar region: Secondary | ICD-10-CM | POA: Insufficient documentation

## 2013-08-15 DIAGNOSIS — M51369 Other intervertebral disc degeneration, lumbar region without mention of lumbar back pain or lower extremity pain: Secondary | ICD-10-CM | POA: Insufficient documentation

## 2013-08-15 NOTE — Telephone Encounter (Signed)
appt scheduled for today 08/15/13

## 2013-08-15 NOTE — Progress Notes (Signed)
Pre visit review using our clinic review tool, if applicable. No additional management support is needed unless otherwise documented below in the visit note. 

## 2013-08-15 NOTE — Progress Notes (Signed)
Subjective:    Patient ID: Sarah Young, female    DOB: June 23, 1940, 73 y.o.   MRN: [id number]  HPI Here to discuss her lidoderm patch - which she has used for years (Dr Justine Null started her on it)  She has used for approx 10-15   Uses for her fibromyalgia and arthritis pain - right scapular area (muscle pain) and also low back  Has some disc dz in low back (not severe enough for surgery)- and also arthritis  Has been on any medicines and also PT for years  Also has a hx of polio - was checked for post polio symptoms (in the 1980s)  She cuts them in 1/2 and does not use them all the time-only when pain is severe  3 boxes last her a whole year     Also amitriptyline  Flexeril  aleve norco- takes infrequently-one or less times per week   One box of patches - is 185$ - would last her approx 1/3 of a year for the most part  Using as directed   Patient Active Problem List   Diagnosis Date Noted  . Tick bite of thigh 10/18/2012  . Other malaise and fatigue 10/18/2012  . Shingles 11/02/2011  . BPPV (benign paroxysmal positional vertigo) 09/16/2011  . Cerumen impaction 09/16/2011  . CONTACT DERMATITIS 02/10/2010  . GASTRITIS 11/24/2009  . ESSENTIAL HYPERTENSION 10/02/2008  . MIGRAINE HEADACHE 09/06/2008  . VITAMIN D DEFICIENCY 06/27/2007  . HYPERLIPIDEMIA 05/11/2007  . ANXIETY 05/11/2007  . OSTEOPENIA 05/11/2007  . POLIOMYELITIS 05/08/2007  . OSTEOARTHRITIS, HANDS, BILATERAL 05/08/2007  . FIBROMYALGIA 05/08/2007  . URINARY INCONTINENCE, STRESS, MILD 05/08/2007   Past Medical History  Diagnosis Date  . Osteopenia   . Anxiety   . Hyperlipidemia   . Vitamin D deficiency   . Fibromyalgia   . Arthritis     OA of hands  . Migraine   . DVT (deep venous thrombosis) 1980s   Past Surgical History  Procedure Laterality Date  . Abdominal hysterectomy  1972  . Ovarian cyst removed  1985  . Breast surgery      lumpectomy fibrocystic breast  . Cholecystectomy  2005   History    Substance Use Topics  . Smoking status: Never Smoker   . Smokeless tobacco: Not on file  . Alcohol Use: No   Family History  Problem Relation Age of Onset  . Hypertension Father   . Cancer Father     CA of stomach and intestines  . Cancer Brother     breast CA   Allergies  Allergen Reactions  . Amoxicillin     REACTION: reacts opposite effect made infection worse.  Marland Kitchen Amoxicillin-Pot Clavulanate     REACTION: Felt like body turned inside out  . Esomeprazole Magnesium     REACTION: Nausea  . Omeprazole     REACTION: not effective  . Ranitidine Hcl     REACTION: not effective   Current Outpatient Prescriptions on File Prior to Visit  Medication Sig Dispense Refill  . ALPRAZolam (XANAX) 0.5 MG tablet Take 1 tablet by mouth up to twice a day as needed for severe anxiety  30 tablet  0  . amitriptyline (ELAVIL) 10 MG tablet TAKE 1 TABLET BY MOUTH AT BEDTIME, MAY INCREASE WEEKLY AS TOLERATED TO A MAX OF5 TABLETS (50MG )  90 tablet  11  . calcium carbonate 200 MG capsule Take 250 mg by mouth 2 (two) times daily.      . Cholecalciferol (  VITAMIN D PO) Take 1 tablet by mouth 2 (two) times daily.      . cyclobenzaprine (FLEXERIL) 10 MG tablet TAKE ONE TABLET AT BEDTIME DAILY AS     NEEDED FOR MUSCLE SPASMS  30 tablet  5  . dicyclomine (BENTYL) 20 MG tablet Take 20 mg by mouth every 6 (six) hours.      . fish oil-omega-3 fatty acids 1000 MG capsule Take 1 g by mouth 2 (two) times daily.      Marland Kitchen HYDROcodone-acetaminophen (NORCO/VICODIN) 5-325 MG per tablet TAKE ONE TABLET BY MOUTH EVERY 6 HOURS AS NEEDED FOR PAIN  30 tablet  0  . lidocaine (LIDODERM) 5 % APPLY 1 PATCH DAILY. REMOVE PATCH [id number] HOURS  30 patch  3  . promethazine (PHENERGAN) 12.5 MG tablet Take 12.5 mg by mouth every 8 (eight) hours as needed.      . SUMAtriptan (IMITREX) 100 MG tablet TAKE ONE TABLET AS NEEDED FOR MIGRAINE. MAY REPEAT IN TWO HOURS IF NEEDED *MAX OF 2 TABLETS IN 24 HOURS*  10 tablet  11  . fluticasone  (FLONASE) 50 MCG/ACT nasal spray Place 1 spray into the nose daily.  16 g  3   No current facility-administered medications on file prior to visit.    Review of Systems     Objective:   Physical Exam        Assessment & Plan:

## 2013-08-15 NOTE — Patient Instructions (Signed)
I will write an appeal letter to your insurance company for your lidocaine patches  I will let you know when I hear something  A pain clinic referral is an option if you cannot get the medicine  Places to call for pricing include [facility]/[employer] or [facility]

## 2013-08-16 NOTE — Assessment & Plan Note (Signed)
Chronic pain / non surgical candidate  Uses lidocaine patch for this and is already on several other agents Will appeal to ins co as she cannot afford w/o coverage

## 2013-08-16 NOTE — Assessment & Plan Note (Signed)
With fibromyalgia On multiple other agents -still needs lidocaine patch for bkthrough pain -and ins no longer covers  Has done PT as well  Will write appeal letter

## 2013-08-16 NOTE — Assessment & Plan Note (Signed)
Already on multiple meds and needs lidocaine patches for breakthrough pain in LS and also R scapular area  Ins will not cover/denied prior auth We will write appeal letter She really needs these for breakthrough pain to fxn

## 2013-08-22 ENCOUNTER — Telehealth: Payer: Self-pay | Admitting: Family Medicine

## 2013-08-23 ENCOUNTER — Telehealth: Payer: Self-pay

## 2013-08-23 NOTE — Telephone Encounter (Signed)
None of those types of pain

## 2013-08-23 NOTE — Telephone Encounter (Signed)
Celete with [employer] left v/m requesting cb for appeals request for lidocaine patch; Celete wants clarification if pt has shingles pain,post neuralgia, diabetic neuropathy or cancer related neuropathic pain. Celete request cb.

## 2013-08-27 NOTE — Telephone Encounter (Signed)
Form faxed

## 2013-08-27 NOTE — Telephone Encounter (Signed)
Done and in IN box 

## 2013-08-27 NOTE — Telephone Encounter (Signed)
Ramone notified that pt hasn't had any of these types of pain, Ramone faxed a form for more info, form placed in your inbox

## 2013-08-27 NOTE — Telephone Encounter (Signed)
Ramone with [facility] appeals dept request cb.

## 2013-09-02 NOTE — Telephone Encounter (Signed)
error 

## 2013-09-06 ENCOUNTER — Telehealth: Payer: Self-pay | Admitting: *Deleted

## 2013-09-06 NOTE — Telephone Encounter (Signed)
Left voicemail letting pt know that our appeal for her Lidocaine Patches was denied and if she had any questions to call office back

## 2013-09-24 ENCOUNTER — Other Ambulatory Visit: Payer: Self-pay | Admitting: Family Medicine

## 2013-09-24 NOTE — Telephone Encounter (Signed)
Printed

## 2013-09-24 NOTE — Telephone Encounter (Signed)
Ok to refill in Dr. Marliss Coots absence? Last filled on 07/04/13 #30 with 0RF.

## 2013-09-25 NOTE — Telephone Encounter (Signed)
Patient advised.  Rx left at front desk for pick up. 

## 2013-10-28 ENCOUNTER — Other Ambulatory Visit: Payer: Self-pay | Admitting: Family Medicine

## 2013-12-05 ENCOUNTER — Other Ambulatory Visit: Payer: Self-pay | Admitting: Family Medicine

## 2013-12-05 NOTE — Telephone Encounter (Signed)
Px printed for pick up in IN box  

## 2013-12-05 NOTE — Telephone Encounter (Signed)
Last filled 09/24/2013 

## 2013-12-05 NOTE — Telephone Encounter (Signed)
Spouse notified Rx ready for pick-up

## 2014-02-08 ENCOUNTER — Other Ambulatory Visit: Payer: Self-pay | Admitting: Family Medicine

## 2014-02-08 NOTE — Telephone Encounter (Signed)
Please refill for a year  

## 2014-02-08 NOTE — Telephone Encounter (Signed)
Electronic refill request, please advise  

## 2014-02-14 ENCOUNTER — Other Ambulatory Visit: Payer: Self-pay

## 2014-03-01 ENCOUNTER — Encounter: Payer: Self-pay | Admitting: Family Medicine

## 2014-03-01 ENCOUNTER — Ambulatory Visit (INDEPENDENT_AMBULATORY_CARE_PROVIDER_SITE_OTHER): Payer: Medicare Other | Admitting: Family Medicine

## 2014-03-01 VITALS — BP 146/82 | HR 75 | Temp 98.1°F | Ht 64.25 in | Wt 146.5 lb

## 2014-03-01 DIAGNOSIS — K2990 Gastroduodenitis, unspecified, without bleeding: Secondary | ICD-10-CM

## 2014-03-01 DIAGNOSIS — K2100 Gastro-esophageal reflux disease with esophagitis, without bleeding: Secondary | ICD-10-CM

## 2014-03-01 DIAGNOSIS — K2970 Gastritis, unspecified, without bleeding: Secondary | ICD-10-CM

## 2014-03-01 DIAGNOSIS — K219 Gastro-esophageal reflux disease without esophagitis: Secondary | ICD-10-CM | POA: Insufficient documentation

## 2014-03-01 MED ORDER — ESOMEPRAZOLE MAGNESIUM 40 MG PO CPDR: 40.0000 mg | DELAYED_RELEASE_CAPSULE | Freq: Every day | ORAL | Status: DC

## 2014-03-01 NOTE — Progress Notes (Signed)
Subjective:    Patient ID: Sarah Young, female    DOB: June 14, 1940, 73 y.o.   MRN: [id number]  HPI Here with GI issues  Getting worse   Pain in epigastrium "stomach is on fire" Has to sleep propped up due to reflux symptoms  No n/v  Has to watch what she eats - eating bland food -potato and crackers  Last EGD - 1/12 with esophagitis   Has IBS -no change from usual  No dark stools  Has hemorrhoids   Taking over the counter antacids -not working  No px meds  tums do not work  Omeprazole otc no help  Zantac -tried also   Px med that worked best otc was Building survey[employer] -- her insurance would not cover it   nexium may have helped some   ? Trigger  Has had a lot going on  Cataract surgery Dx with arthritis in shoulder and spine this summer- ortho visits and MRI and had epidural (Oct) and PT  It did help some   She stays away from nsaids    Patient Active Problem List   Diagnosis Date Noted  . Lumbar degenerative disc disease 08/15/2013  . Chronic pain of scapula 08/15/2013  . Tick bite of thigh 10/18/2012  . Other malaise and fatigue 10/18/2012  . Shingles 11/02/2011  . BPPV (benign paroxysmal positional vertigo) 09/16/2011  . Cerumen impaction 09/16/2011  . CONTACT DERMATITIS 02/10/2010  . GASTRITIS 11/24/2009  . ESSENTIAL HYPERTENSION 10/02/2008  . MIGRAINE HEADACHE 09/06/2008  . VITAMIN D DEFICIENCY 06/27/2007  . HYPERLIPIDEMIA 05/11/2007  . ANXIETY 05/11/2007  . OSTEOPENIA 05/11/2007  . POLIOMYELITIS 05/08/2007  . OSTEOARTHRITIS, HANDS, BILATERAL 05/08/2007  . FIBROMYALGIA 05/08/2007  . URINARY INCONTINENCE, STRESS, MILD 05/08/2007   Past Medical History  Diagnosis Date  . Osteopenia   . Anxiety   . Hyperlipidemia   . Vitamin D deficiency   . Fibromyalgia   . Arthritis     OA of hands  . Migraine   . DVT (deep venous thrombosis) 1980s   Past Surgical History  Procedure Laterality Date  . Abdominal hysterectomy  1972  . Ovarian cyst removed  1985    . Breast surgery      lumpectomy fibrocystic breast  . Cholecystectomy  2005   History  Substance Use Topics  . Smoking status: Never Smoker   . Smokeless tobacco: Not on file  . Alcohol Use: No   Family History  Problem Relation Age of Onset  . Hypertension Father   . Cancer Father     CA of stomach and intestines  . Cancer Brother     breast CA   Allergies  Allergen Reactions  . Amoxicillin     REACTION: reacts opposite effect made infection worse.  Marland Kitchen Amoxicillin-Pot Clavulanate     REACTION: Felt like body turned inside out  . Esomeprazole Magnesium     REACTION: Nausea  . Omeprazole     REACTION: not effective  . Ranitidine Hcl     REACTION: not effective   Current Outpatient Prescriptions on File Prior to Visit  Medication Sig Dispense Refill  . ALPRAZolam (XANAX) 0.5 MG tablet Take 1 tablet by mouth up to twice a day as needed for severe anxiety 30 tablet 0  . calcium carbonate 200 MG capsule Take 250 mg by mouth 2 (two) times daily.    . Cholecalciferol (VITAMIN D PO) Take 1 tablet by mouth 2 (two) times daily.    . cyclobenzaprine (FLEXERIL)  10 MG tablet TAKE ONE TABLET AT BEDTIME DAILY AS     NEEDED FOR MUSCLE SPASMS 30 tablet 5  . dicyclomine (BENTYL) 20 MG tablet Take 20 mg by mouth every 6 (six) hours.    . fish oil-omega-3 fatty acids 1000 MG capsule Take 1 g by mouth 2 (two) times daily.    . fluticasone (FLONASE) 50 MCG/ACT nasal spray USE 1 SPRAY IN EACH NOSTRIL EVERY DAY ASDIRECTED 16 g 2  . HYDROcodone-acetaminophen (NORCO/VICODIN) 5-325 MG per tablet TAKE 1 TABLET BY MOUTH EVERY 6 HOURS AS NEEDED FOR PAIN. 30 tablet 0  . SUMAtriptan (IMITREX) 100 MG tablet TAKE ONE TABLET AS NEEDED FOR MIGRAINE. MAY REPEAT IN TWO HOURS IF NEEDED *MAX OF 2 TABLETS IN 24 HOURS* 10 tablet 12   No current facility-administered medications on file prior to visit.       Review of Systems Review of Systems  Constitutional: Negative for fever, appetite change, fatigue  and unexpected weight change.  Eyes: Negative for pain and visual disturbance.  Respiratory: Negative for cough and shortness of breath.   Cardiovascular: Negative for cp or palpitations    Gastrointestinal: Negative for nausea, vomiting/ blood in stool or dark stool pos for heartburn and epigastric pain   Genitourinary: Negative for urgency and frequency.  Skin: Negative for pallor or rash   Neurological: Negative for weakness, light-headedness, numbness and headaches.  Hematological: Negative for adenopathy. Does not bruise/bleed easily.  Psychiatric/Behavioral: Negative for dysphoric mood. The patient is not nervous/anxious.         Objective:   Physical Exam  Constitutional: She appears well-developed and well-nourished. No distress.  HENT:  Head: Normocephalic and atraumatic.  Mouth/Throat: Oropharynx is clear and moist.  Eyes: Conjunctivae and EOM are normal. Pupils are equal, round, and reactive to light. No scleral icterus.  Neck: Normal range of motion. Neck supple.  Cardiovascular: Normal rate, regular rhythm and normal heart sounds.   Pulmonary/Chest: Effort normal and breath sounds normal. No respiratory distress. She has no wheezes. She has no rales.  Abdominal: Soft. Bowel sounds are normal. She exhibits no distension and no mass. There is no hepatosplenomegaly. There is tenderness in the epigastric area. There is no rigidity, no rebound, no guarding, no CVA tenderness, no tenderness at McBurney's point and negative Murphy's sign.  Musculoskeletal: She exhibits no edema.  Lymphadenopathy:    She has no cervical adenopathy.  Neurological: She is alert. She has normal reflexes.  Skin: Skin is warm and dry. No rash noted. No erythema.  Psychiatric: She has a normal mood and affect.          Assessment & Plan:   Problem List Items Addressed This Visit      Digestive   Gastritis and gastroduodenitis - Primary    Symptoms have worsened  Not on any px med  Failed  zantac/omeprazole/prevacid/protonix dexilant helps-ins will not pay for it  Px nexium today 40 mg  If no imp will need f/u GI  Hx of gerd with esophagitis in the past    GERD (gastroesophageal reflux disease)    Failure of many px and otc options Will try nexium 40 mg  If not imp will need to return to GI Dr Oletta Lamas Hx of esophagitis Father had esoph ca    Relevant Medications      esomeprazole (NEXIUM) capsule

## 2014-03-01 NOTE — Assessment & Plan Note (Signed)
Failure of many px and otc options Will try nexium 40 mg  If not imp will need to return to GI Dr Oletta Lamas Hx of esophagitis Father had esoph ca

## 2014-03-01 NOTE — Patient Instructions (Signed)
Start nexium today as soon as you get it  Then take each am before breakfast  Eat a bland diet  If symptoms do not improve in 1-2 weeks let me know and we will refer you to Dr Oletta Lamas (GI)

## 2014-03-01 NOTE — Assessment & Plan Note (Signed)
Symptoms have worsened  Not on any px med  Failed zantac/omeprazole/prevacid/protonix dexilant helps-ins will not pay for it  Px nexium today 40 mg  If no imp will need f/u GI  Hx of gerd with esophagitis in the past

## 2014-03-01 NOTE — Progress Notes (Signed)
Pre visit review using our clinic review tool, if applicable. No additional management support is needed unless otherwise documented below in the visit note. 

## 2014-03-29 HISTORY — PX: BREAST LUMPECTOMY: SHX2

## 2014-04-12 ENCOUNTER — Other Ambulatory Visit: Payer: Self-pay

## 2014-04-12 DIAGNOSIS — Z1231 Encounter for screening mammogram for malignant neoplasm of breast: Secondary | ICD-10-CM

## 2014-04-17 ENCOUNTER — Ambulatory Visit
Admission: RE | Admit: 2014-04-17 | Discharge: 2014-04-17 | Disposition: A | Discharge status: Home / Self Care | Payer: Medicare Other | Source: Ambulatory Visit

## 2014-04-17 DIAGNOSIS — Z1231 Encounter for screening mammogram for malignant neoplasm of breast: Secondary | ICD-10-CM

## 2014-04-22 ENCOUNTER — Other Ambulatory Visit: Payer: Self-pay | Admitting: Family Medicine

## 2014-04-22 DIAGNOSIS — R928 Other abnormal and inconclusive findings on diagnostic imaging of breast: Secondary | ICD-10-CM

## 2014-04-26 ENCOUNTER — Other Ambulatory Visit: Payer: Self-pay | Admitting: Family Medicine

## 2014-04-26 DIAGNOSIS — R928 Other abnormal and inconclusive findings on diagnostic imaging of breast: Secondary | ICD-10-CM

## 2014-05-01 ENCOUNTER — Other Ambulatory Visit: Payer: Self-pay | Admitting: Family Medicine

## 2014-05-01 ENCOUNTER — Other Ambulatory Visit: Payer: Self-pay

## 2014-05-01 DIAGNOSIS — R928 Other abnormal and inconclusive findings on diagnostic imaging of breast: Secondary | ICD-10-CM

## 2014-05-03 ENCOUNTER — Ambulatory Visit
Admission: RE | Admit: 2014-05-03 | Discharge: 2014-05-03 | Disposition: A | Discharge status: Home / Self Care | Payer: Medicare Other | Source: Ambulatory Visit | Attending: Family Medicine | Admitting: Family Medicine

## 2014-05-03 ENCOUNTER — Other Ambulatory Visit: Payer: Self-pay | Admitting: Family Medicine

## 2014-05-03 DIAGNOSIS — R928 Other abnormal and inconclusive findings on diagnostic imaging of breast: Secondary | ICD-10-CM

## 2014-05-03 DIAGNOSIS — N63 Unspecified lump in breast: Secondary | ICD-10-CM | POA: Diagnosis not present

## 2014-05-03 DIAGNOSIS — C50811 Malignant neoplasm of overlapping sites of right female breast: Secondary | ICD-10-CM | POA: Diagnosis not present

## 2014-05-06 ENCOUNTER — Other Ambulatory Visit: Payer: Self-pay | Admitting: Family Medicine

## 2014-05-06 DIAGNOSIS — C50911 Malignant neoplasm of unspecified site of right female breast: Secondary | ICD-10-CM

## 2014-05-07 ENCOUNTER — Telehealth: Payer: Self-pay | Admitting: *Deleted

## 2014-05-07 NOTE — Telephone Encounter (Signed)
Error

## 2014-05-08 ENCOUNTER — Telehealth: Payer: Self-pay | Admitting: *Deleted

## 2014-05-08 ENCOUNTER — Encounter: Payer: Self-pay | Admitting: *Deleted

## 2014-05-08 DIAGNOSIS — C50411 Malignant neoplasm of upper-outer quadrant of right female breast: Secondary | ICD-10-CM

## 2014-05-08 DIAGNOSIS — Z853 Personal history of malignant neoplasm of breast: Secondary | ICD-10-CM | POA: Insufficient documentation

## 2014-05-08 DIAGNOSIS — Z170 Estrogen receptor positive status [ER+]: Secondary | ICD-10-CM

## 2014-05-08 HISTORY — DX: Malignant neoplasm of upper-outer quadrant of right female breast: C50.411

## 2014-05-08 NOTE — Telephone Encounter (Signed)
Confirmed [facility] for 05/15/14 at 1200 .  Instructions and contact information given.

## 2014-05-13 ENCOUNTER — Other Ambulatory Visit: Payer: Medicare Other

## 2014-05-15 ENCOUNTER — Encounter: Payer: Self-pay | Admitting: Oncology

## 2014-05-15 ENCOUNTER — Other Ambulatory Visit (HOSPITAL_BASED_OUTPATIENT_CLINIC_OR_DEPARTMENT_OTHER): Payer: Medicare Other

## 2014-05-15 ENCOUNTER — Ambulatory Visit (HOSPITAL_BASED_OUTPATIENT_CLINIC_OR_DEPARTMENT_OTHER): Payer: Medicare Other

## 2014-05-15 ENCOUNTER — Encounter: Payer: Self-pay | Admitting: General Practice

## 2014-05-15 ENCOUNTER — Ambulatory Visit
Admission: RE | Admit: 2014-05-15 | Discharge: 2014-05-15 | Disposition: A | Discharge status: Home / Self Care | Payer: Medicare Other | Source: Ambulatory Visit | Attending: Radiation Oncology | Admitting: Radiation Oncology

## 2014-05-15 ENCOUNTER — Ambulatory Visit (HOSPITAL_BASED_OUTPATIENT_CLINIC_OR_DEPARTMENT_OTHER): Payer: Medicare Other | Admitting: Oncology

## 2014-05-15 ENCOUNTER — Other Ambulatory Visit (INDEPENDENT_AMBULATORY_CARE_PROVIDER_SITE_OTHER): Payer: Self-pay | Admitting: General Surgery

## 2014-05-15 VITALS — BP 154/68 | HR 92 | Temp 98.1°F | Resp 18 | Ht 64.25 in | Wt 145.2 lb

## 2014-05-15 DIAGNOSIS — M949 Disorder of cartilage, unspecified: Secondary | ICD-10-CM

## 2014-05-15 DIAGNOSIS — Z803 Family history of malignant neoplasm of breast: Secondary | ICD-10-CM | POA: Diagnosis not present

## 2014-05-15 DIAGNOSIS — M899 Disorder of bone, unspecified: Secondary | ICD-10-CM

## 2014-05-15 DIAGNOSIS — C50411 Malignant neoplasm of upper-outer quadrant of right female breast: Secondary | ICD-10-CM

## 2014-05-15 DIAGNOSIS — C50811 Malignant neoplasm of overlapping sites of right female breast: Secondary | ICD-10-CM | POA: Diagnosis not present

## 2014-05-15 DIAGNOSIS — E782 Mixed hyperlipidemia: Secondary | ICD-10-CM

## 2014-05-15 DIAGNOSIS — I10 Essential (primary) hypertension: Secondary | ICD-10-CM

## 2014-05-15 DIAGNOSIS — Z170 Estrogen receptor positive status [ER+]: Secondary | ICD-10-CM | POA: Diagnosis not present

## 2014-05-15 DIAGNOSIS — G43119 Migraine with aura, intractable, without status migrainosus: Secondary | ICD-10-CM

## 2014-05-15 LAB — COMPREHENSIVE METABOLIC PANEL (CC13)
ALBUMIN: 3.6 g/dL (ref 3.5–5.0)
ALK PHOS: 70 U/L (ref 40–150)
ALT: 15 U/L (ref 0–55)
AST: 22 U/L (ref 5–34)
Anion Gap: 8 mEq/L (ref 3–11)
BUN: 19.2 mg/dL (ref 7.0–26.0)
CALCIUM: 9.6 mg/dL (ref 8.4–10.4)
CO2: 28 mEq/L (ref 22–29)
Chloride: 107 mEq/L (ref 98–109)
Creatinine: 1.2 mg/dL — ABNORMAL HIGH (ref 0.6–1.1)
EGFR: 46 mL/min/{1.73_m2} — ABNORMAL LOW (ref >90)
GLUCOSE: 94 mg/dL (ref 70–140)
POTASSIUM: 4.4 meq/L (ref 3.5–5.1)
Sodium: 143 mEq/L (ref 136–145)
Total Bilirubin: 0.34 mg/dL (ref 0.20–1.20)
Total Protein: 6.6 g/dL (ref 6.4–8.3)

## 2014-05-15 LAB — CBC WITH DIFFERENTIAL/PLATELET
BASO%: 1.4 % (ref 0.0–2.0)
BASOS ABS: 0.1 10*3/uL (ref 0.0–0.1)
EOS%: 1 % (ref 0.0–7.0)
Eosinophils Absolute: 0.1 10*3/uL (ref 0.0–0.5)
HCT: 41.3 % (ref 34.8–46.6)
HEMOGLOBIN: 13.1 g/dL (ref 11.6–15.9)
LYMPH#: 1.9 10*3/uL (ref 0.9–3.3)
LYMPH%: 27.5 % (ref 14.0–49.7)
MCH: 28.2 pg (ref 25.1–34.0)
MCHC: 31.7 g/dL (ref 31.5–36.0)
MCV: 88.9 fL (ref 79.5–101.0)
MONO#: 0.4 10*3/uL (ref 0.1–0.9)
MONO%: 6 % (ref 0.0–14.0)
NEUT#: 4.3 10*3/uL (ref 1.5–6.5)
NEUT%: 64.1 % (ref 38.4–76.8)
Platelets: 244 10*3/uL (ref 145–400)
RBC: 4.65 10*6/uL (ref 3.70–5.45)
RDW: 14.3 % (ref 11.2–14.5)
WBC: 6.7 10*3/uL (ref 3.9–10.3)

## 2014-05-15 MED ORDER — CHLORHEXIDINE GLUCONATE 4 % EX LIQD: 1.0000 "application " | Freq: Once | CUTANEOUS | Status: DC

## 2014-05-15 MED ORDER — VANCOMYCIN HCL 10 G IV SOLR: 1000.0000 mg | INTRAVENOUS | Status: AC

## 2014-05-15 NOTE — Progress Notes (Signed)
Checked in new pt with no financial concerns prior to seeing the dr. Informed pt if chemo is part of her treatment I will call to see if Josem Kaufmann is req and will obtain it if it is as well as contact foundations that offer copay assistance for chemo if needed. She has my card for any billing questions or concerns.

## 2014-05-15 NOTE — Progress Notes (Signed)
Sarah Young is a very pleasant 74 y.o. 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  Biopsy results revealed the tumor's hormone status as ER positive, PR negative, and HER2/neu equivocal with ratio of 1.7. Ki67 is 12%.  She presents today with her husband to the [facility] ([facility] for treatment consideration and recommendations from the breast surgeon, radiation oncologist, and medical oncologist.     I briefly met with Sarah Young and her husband during her [facility] visit today. We discussed the purpose of the Survivorship Clinic, which will include monitoring for recurrence, coordinating completion of age and gender-appropriate cancer screenings, promotion of overall wellness, as well as managing potential late/long-term side effects of anti-cancer treatments.    As of today, the treatment plan for Sarah Young will likely include surgery and potentially radiation therapy.  She will meet with the Genetics Counselor due to her personal history of left breast cancer.  Anti-estrogen treatment will be considered for her as well.  The intent of treatment for Sarah Young is cure, therefore she will be eligible for the Survivorship Clinic upon her completion of treatment.  Her survivorship care plan (SCP) document will be drafted and updated throughout the course of her treatment trajectory. She will receive the SCP in an office visit with myself in the Survivorship Clinic once she has completed treatment.   Sarah Young was encouraged to ask questions and all questions were answered to her satisfaction.  She was given my business card and encouraged to contact me with any concerns regarding survivorship.  I look forward to participating in her care.   Gretchen Dawson, NP Survivorship Program [facility] [phone] 

## 2014-05-15 NOTE — Progress Notes (Signed)
[place]  [phone] Fax:([phone]     ID: Sarah Young DOB: Jan 29, 1941  MR#: [id number]  PJA#:[id number]  Patient Care Team: Abner Greenspan, MD as PCP - Charlotte III, MD as Consulting Physician (General Surgery) Chauncey Cruel, MD as Consulting Physician (Oncology) Rexene Edison, MD as Consulting Physician (Radiation Oncology) Roselee Culver, RN as Registered Nurse [facility] Caleen Jobs, RN as Registered Nurse PCP: Loura Pardon, MD GYN: SU:  OTHER MD:  CHIEF COMPLAINT: Estrogen receptor positive breast cancer  CURRENT TREATMENT: Awaiting definitive surgery   BREAST CANCER HISTORY: Sarah Young had routine bilateral screening mammography with tomography at the [facility] April 17 2014. This found the breast density to be category C. A possible mass was noted in the right breast, and on 05/03/2014 she underwent right diagnostic mammography with ultrasonography. There was a spiculated mass in the upper outer quadrant of the right breast which was new since the prior mammogram. On physical exam there was mild nodularity noted in the area in question. Ultrasound confirmed a hypoechoic irregular mass measuring 0.9 cm in the upper outer quadrant.  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  MRI of the breasts is scheduled for 05/20/2014. The patient's subsequent history is as detailed below  INTERVAL HISTORY: Sarah Young was evaluated in the multidisciplinary breast cancer clinic 05/15/2014 accompanied by her husband Sarah Young and her daughter Sarah Young. The patient's case was also presented in the multidisciplinary breast cancer conference that same morning. The preliminary  planned suggested at that point was for genetics referral, breast conserving surgery, Oncotype, to my therapy depending on those results, and then radiation and anti-estrogens.  REVIEW OF SYSTEMS: There were no specific symptoms leading to the original mammogram, which was routinely scheduled. The patient denies unusual headaches, visual changes, nausea, vomiting, stiff neck, dizziness, or gait imbalance. There has been no cough, phlegm production, or pleurisy, no chest pain or pressure, and no change in bowel or bladder habits. The patient denies fever, rash, bleeding, unexplained fatigue or unexplained weight loss. The patient admits to some shoulder and back pain, but this is long-standing and it is not more persistent or intense than prior. She has mild heartburn issues. A detailed review of systems was otherwise entirely negative.  PAST MEDICAL HISTORY: Past Medical History  Diagnosis Date  . Osteopenia   . Anxiety   . Hyperlipidemia   . Vitamin D deficiency   . Fibromyalgia   . Arthritis     OA of hands  . Migraine   . DVT (deep venous thrombosis) 1980s  . Breast cancer of upper-outer quadrant of right female breast 05/08/2014  . Irritable bowel syndrome   . Fibromyalgia     PAST SURGICAL HISTORY: Past Surgical History  Procedure Laterality Date  . Abdominal hysterectomy  1972  . Ovarian cyst removed  1985  . Breast surgery      lumpectomy fibrocystic breast  . Cholecystectomy  2005  . Cataract extraction      FAMILY HISTORY Family History  Problem Relation Age of Onset  . Hypertension Father   . Cancer Father     CA of stomach and intestines  . Cancer Brother     breast CA  .  Leukemia Mother    the patient's father died at the age of 81 with gastric cancer. The patient's mother was diagnosed with chronic leukemia at age 18 and died from complications of dementia at age 4 the patient has one brother, diagnosed with breast cancer at the age of 22. She has 2 sisters.  There is no other history of breast or ovarian cancer in the family.  GYNECOLOGIC HISTORY:  No LMP recorded. Patient has had a hysterectomy. Menarche age 30, first live birth age 34. The patient is GX P2. She underwent total abdominal hysterectomy with bilateral salpingo-oophorectomy 1972. She used hormone replacement for approximately 38 years, until 2011.  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 Her husband Sarah Young is currently not active in the business. Daughter [name], lives in [place], his Pres. The other daughter, [name], lives in [place] where she works as a Landscape architect. The patient has 3 grandchildren. She attends a local [employer] DIRECTIVES: The patient has named both her husband Sarah Young and her daughter Sarah Young as Equities trader powers of attorney.   HEALTH MAINTENANCE: History  Substance Use Topics  . Smoking status: Never Smoker   . Smokeless tobacco: Not on file  . Alcohol Use: No     Colonoscopy:  PAP:  Bone density:  Lipid panel:  Allergies  Allergen Reactions  . Amoxicillin     REACTION: reacts opposite effect made infection worse.  Marland Kitchen Amoxicillin-Pot Clavulanate     REACTION: Felt like body turned inside out  . Esomeprazole Magnesium     REACTION: Nausea  . Omeprazole     REACTION: not effective  . Ranitidine Hcl     REACTION: not effective    Current Outpatient Prescriptions  Medication Sig Dispense Refill  . amitriptyline (ELAVIL) 10 MG tablet Take 10 mg by mouth at bedtime.    . Cholecalciferol (VITAMIN D PO) Take 1 tablet by mouth 2 (two) times daily.    . cyclobenzaprine (FLEXERIL) 10 MG tablet TAKE ONE TABLET AT BEDTIME DAILY AS     NEEDED FOR MUSCLE SPASMS 30 tablet 5  . dicyclomine (BENTYL) 20 MG tablet Take 20 mg by mouth every 6 (six) hours.    . fish oil-omega-3 fatty acids 1000 MG capsule Take 1 g by mouth 2 (two) times  daily.    . fluticasone (FLONASE) 50 MCG/ACT nasal spray USE 1 SPRAY IN EACH NOSTRIL EVERY DAY ASDIRECTED 16 g 2  . HYDROcodone-acetaminophen (NORCO/VICODIN) 5-325 MG per tablet TAKE 1 TABLET BY MOUTH EVERY 6 HOURS AS NEEDED FOR PAIN. 30 tablet 0  . SUMAtriptan (IMITREX) 100 MG tablet TAKE ONE TABLET AS NEEDED FOR MIGRAINE. MAY REPEAT IN TWO HOURS IF NEEDED *MAX OF 2 TABLETS IN 24 HOURS* 10 tablet 12  . ALPRAZolam (XANAX) 0.5 MG tablet Take 1 tablet by mouth up to twice a day as needed for severe anxiety (Patient not taking: Reported on 05/15/2014) 30 tablet 0  . calcium carbonate 200 MG capsule Take 250 mg by mouth 2 (two) times daily.     No current facility-administered medications for this visit.    OBJECTIVE: Older white woman who appears stated age 60 Vitals:   05/15/14 1254  BP: 154/68  Pulse: 92  Temp: 98.1 F (36.7 C)  Resp: 18     Body mass index is 24.73 kg/(m^2).    ECOG FS:1 - Symptomatic but completely ambulatory  Ocular:  Sclerae unicteric, EOMs intact Ear-nose-throat: Oropharynx clear and moist Lymphatic: No cervical or supraclavicular adenopathy Lungs no rales or rhonchi, good excursion bilaterally Heart regular rate and rhythm, no murmur appreciated Abd soft, nontender, positive bowel sounds MSK no focal spinal tenderness, no joint edema Neuro: non-focal, well-oriented, appropriate affect Breasts: The right breast is status post recent biopsy. I do not palpate a well-defined mass. There are no skin or nipple changes of concern. The right axilla is benign per the left breast is unremarkable.   LAB RESULTS:  CMP     Component Value Date/Time   NA 143 05/15/2014 1231   NA 140 04/20/2013 1159   K 4.4 05/15/2014 1231   K 4.5 04/20/2013 1159   CL 103 04/20/2013 1159   CO2 28 05/15/2014 1231   CO2 32 04/20/2013 1159   GLUCOSE 94 05/15/2014 1231   GLUCOSE 88 04/20/2013 1159   BUN 19.2 05/15/2014 1231   BUN 20 04/20/2013 1159   CREATININE 1.2* 05/15/2014 1231     CREATININE 1.2 04/20/2013 1159   CALCIUM 9.6 05/15/2014 1231   CALCIUM 9.8 04/20/2013 1159   PROT 6.6 05/15/2014 1231   PROT 7.1 04/20/2013 1159   ALBUMIN 3.6 05/15/2014 1231   ALBUMIN 3.8 04/20/2013 1159   AST 22 05/15/2014 1231   AST 23 04/20/2013 1159   ALT 15 05/15/2014 1231   ALT 15 04/20/2013 1159   ALKPHOS 70 05/15/2014 1231   ALKPHOS 61 04/20/2013 1159   BILITOT 0.34 05/15/2014 1231   BILITOT 0.7 04/20/2013 1159   GFRNONAA 50.17 10/07/2009 0952   GFRAA 58 05/11/2007 1023    INo results found for: SPEP, UPEP  Lab Results  Component Value Date   WBC 6.7 05/15/2014   NEUTROABS 4.3 05/15/2014   HGB 13.1 05/15/2014   HCT 41.3 05/15/2014   MCV 88.9 05/15/2014   PLT 244 05/15/2014      Chemistry      Component Value Date/Time   NA 143 05/15/2014 1231   NA 140 04/20/2013 1159   K 4.4 05/15/2014 1231   K 4.5 04/20/2013 1159   CL 103 04/20/2013 1159   CO2 28 05/15/2014 1231   CO2 32 04/20/2013 1159   BUN 19.2 05/15/2014 1231   BUN 20 04/20/2013 1159   CREATININE 1.2* 05/15/2014 1231   CREATININE 1.2 04/20/2013 1159      Component Value Date/Time   CALCIUM 9.6 05/15/2014 1231   CALCIUM 9.8 04/20/2013 1159   ALKPHOS 70 05/15/2014 1231   ALKPHOS 61 04/20/2013 1159   AST 22 05/15/2014 1231   AST 23 04/20/2013 1159   ALT 15 05/15/2014 1231   ALT 15 04/20/2013 1159   BILITOT 0.34 05/15/2014 1231   BILITOT 0.7 04/20/2013 1159       No results found for: LABCA2  No components found for: LABCA125  No results for input(s): INR in the last 168 hours.  Urinalysis No results found for: COLORURINE, APPEARANCEUR, LABSPEC, PHURINE, GLUCOSEU, HGBUR, BILIRUBINUR, KETONESUR, PROTEINUR, UROBILINOGEN, NITRITE, LEUKOCYTESUR  STUDIES: Mm Digital Diagnostic Unilat R  05/03/2014   CLINICAL DATA:  Post ultrasound-guided core needle biopsy clip mammograms  EXAM: DIAGNOSTIC RIGHT MAMMOGRAM POST ULTRASOUND BIOPSY  COMPARISON:  Previous exam(s).  FINDINGS: Mammographic  images were obtained following ultrasound guided biopsy of 12 o'clock right breast mass. Ribbon shaped biopsy clip lies within the upper right breast mass.  IMPRESSION: Biopsy clip lies within the biopsied upper right breast mass.  Final Assessment: Post Procedure Mammograms for Young Placement  Electronically Signed   By: Lajean Manes M.D.   On: 05/03/2014 13:58   Mm Digital Diagnostic Unilat R  05/03/2014   CLINICAL DATA:  Callback screening for a possible right breast mass  EXAM: DIGITAL DIAGNOSTIC RIGHT MAMMOGRAM WITH CAD  ULTRASOUND RIGHT BREAST  COMPARISON:  Prior exams  ACR Breast Density Category b: There are scattered areas of fibroglandular density.  FINDINGS: Spiculated mass persists in the posterior upper outer right breast, developing since the prior mammogram. No axillary masses. No other breast abnormalities.  Mammographic images were processed with CAD.  On physical exam, mild nodularity is noted in the upper right breast with no discrete mass.  Targeted ultrasound is performed, showing a hypoechoic, irregular mass with partly ill-defined margins in the 12 o'clock position of the right breast, 14 cm from the nipple, measuring 8 mm x 9 mm x 9 mm. Ultrasound of the right axilla shows no enlarged or abnormal lymph nodes.  IMPRESSION: Spiculated mass in the upper right breast highly suspicious for a breast malignancy. Patient has a history of left breast malignancy in the 1980s.  RECOMMENDATION: Biopsy. Patient will undergo ultrasound-guided core needle biopsy of this mass today.  I have discussed the findings and recommendations with the patient. Results were also provided in writing at the conclusion of the visit. If applicable, a reminder letter will be sent to the patient regarding the next appointment.  BI-RADS CATEGORY  5: Highly suggestive of malignancy.   Electronically Signed   By: Lajean Manes M.D.   On: 05/03/2014 13:43   US Breast Ltd Uni Right Inc Axilla  05/03/2014   CLINICAL DATA:   Callback screening for a possible right breast mass  EXAM: DIGITAL DIAGNOSTIC RIGHT MAMMOGRAM WITH CAD  ULTRASOUND RIGHT BREAST  COMPARISON:  Prior exams  ACR Breast Density Category b: There are scattered areas of fibroglandular density.  FINDINGS: Spiculated mass persists in the posterior upper outer right breast, developing since the prior mammogram. No axillary masses. No other breast abnormalities.  Mammographic images were processed with CAD.  On physical exam, mild nodularity is noted in the upper right breast with no discrete mass.  Targeted ultrasound is performed, showing a hypoechoic, irregular mass with partly ill-defined margins in the 12 o'clock position of the right breast, 14 cm from the nipple, measuring 8 mm x 9 mm x 9 mm. Ultrasound of the right axilla shows no enlarged or abnormal lymph nodes.  IMPRESSION: Spiculated mass in the upper right breast highly suspicious for a breast malignancy. Patient has a history of left breast malignancy in the 1980s.  RECOMMENDATION: Biopsy. Patient will undergo ultrasound-guided core needle biopsy of this mass today.  I have discussed the findings and recommendations with the patient. Results were also provided in writing at the conclusion of the visit. If applicable, a reminder letter will be sent to the patient regarding the next appointment.  BI-RADS CATEGORY  5: Highly suggestive of malignancy.   Electronically Signed   By: Lajean Manes M.D.   On: 05/03/2014 13:43   Mm Screening Breast Tomo Bilateral  04/17/2014   CLINICAL DATA:  Screening.  EXAM: DIGITAL SCREENING BILATERAL MAMMOGRAM WITH 3D TOMO WITH CAD  COMPARISON:  Previous exam(s)  ACR Breast Density Category c: The breast tissue is heterogeneously dense, which may obscure small masses.  FINDINGS: In the right breast, a possible mass warrants further evaluation with spot compression views and possibly ultrasound. In the left breast, no findings suspicious for malignancy. Images were processed  with  CAD.  IMPRESSION: Further evaluation is suggested for possible mass in the right breast.  RECOMMENDATION: Diagnostic mammogram and possibly ultrasound of the right breast. (Code:[id number])  The patient will be contacted regarding the findings, and additional imaging will be scheduled.  BI-RADS CATEGORY  0: Incomplete. Need additional imaging evaluation and/or prior mammograms for comparison.   Electronically Signed   By: Lovey Newcomer M.D.   On: 04/17/2014 14:51   [place] Rt Breast Bx W Loc Dev 1st Lesion Img Bx Spec US Guide  05/06/2014   ADDENDUM REPORT: 05/06/2014 13:40  ADDENDUM: Invasive ductal carcinoma was reported histologically. This corresponds with the imaging findings. The patient was contacted by telephone and given the results of the biopsy. She states the biopsy site is clean and dry with no signs of hematoma or infection. Breast MRI has been scheduled. The patient will be seen at the Multidisciplinary Clinic on 05/15/2014.   Electronically Signed   By: Lillia Mountain M.D.   On: 05/06/2014 13:40   05/06/2014   CLINICAL DATA:  Patient presents for ultrasound-guided core needle biopsy of a 9 mm spiculated upper right breast mass.  EXAM: ULTRASOUND GUIDED RIGHT BREAST CORE NEEDLE BIOPSY  COMPARISON:  Previous exam(s).  PROCEDURE: I met with the patient and we discussed the procedure of ultrasound-guided biopsy, including benefits and alternatives. We discussed the high likelihood of a successful procedure. We discussed the risks of the procedure including infection, bleeding, tissue injury, clip migration, and inadequate sampling. Informed written consent was given. The usual time-out protocol was performed immediately prior to the procedure.  Using sterile technique and 2% Lidocaine as local anesthetic, under direct ultrasound visualization, a 12 gauge vacuum-assisted device was used to perform biopsy of the spiculated, 12 o'clock right breast massusing a inferolateral approach. At the conclusion of the  procedure, a ribbon shaped tissue Young clip was deployed into the biopsy cavity. Follow-up 2-view mammogram was performed and dictated separately.  IMPRESSION: Ultrasound-guided biopsy of a right breast mass. No apparent complications.  Electronically Signed: By: Lajean Manes M.D. On: 05/03/2014 13:57    ASSESSMENT: 74 y.o. [place] woman status post right breast biopsy 05/03/2014 for a clinical T1b N0, stage IA invasive ductal carcinoma, grade 1 or 2, estrogen receptor positive, progesterone receptor negative, with an MIB-1 of 12%, and HER-2/neu equivocal  (1) breast conserving surgery with sentinel lymph node sampling pending; HER-2 will be repeated on the final sample  (2) genetics testing pending  (3) Oncotype results pending; the chemotherapy decision will depend on results  (4) adjuvant radiation to be considered after surgery  (5) anti-estrogens to follow radiation  PLAN: We spent the better part of today's hour-long appointment discussing the biology of breast cancer in general, and the specifics of the patient's tumor in particular. Kylina understands the difference between local treatments, which in her case will consist of surgery and radiation, and systemic therapy, which in her case will certainly include anti-estrogens. If the tumor turns out to be HER-2 positive, she would need anti-HER-2 immunotherapy and also chemotherapy, most likely paclitaxel.  If her tumor proves to be HER-2 negative, then the question of chemotherapy becomes more of an issue. The benefit is likely to be marginal. We discussed an Oncotype DX and the patient has a good understanding of how that test is run and what the results may mean. We will request that be sent from the definitive surgery assuming the tumor is HER-2 negative and there are no involved lymph nodes. The  patient will then see me approximately 3 weeks postop to discuss results.  Given that the patient has a female relative with breast cancer  she qualifies for genetic testing. If she were BRCA positive, there is no hard indication for bilateral mastectomies given her age. She could be followed with yearly MRIs. Note that she is already status post bilateral salpingo-oophorectomy.  The patient has a good understanding of the overall plan. She agrees with it. She knows the goal of treatment in her case is cure. She will call with any problems that may develop before her next visit here.  Chauncey Cruel, MD   05/15/2014 2:58 PM Medical Oncology and Hematology [facility] [address] [place], [place] [address] Tel. [phone]    Fax. [phone]

## 2014-05-15 NOTE — Progress Notes (Signed)
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 female who is seen today at the [facility] through the courtesy of Dr. 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  At the time of a [facility] screening mammogram on January 20 she was felt to have a possible right breast mass.  Additional views on February 5 showed a spiculated mass within the upper-outer quadrant of the right breast which on ultrasound measured 0.8 x 0.9 x 0.9 cm at 12:00.  This was 14 cm from the nipple.  The axilla was negative on ultrasound.  Biopsy of the right breast mass on 05/03/2014 was diagnostic for invasive ductal carcinoma felt to be grade I-II.  Her tumor was ER positive at 99%, PR negative at 0% with a Ki-67 of 12%.  HER-2/neu was equivocal.  She is scheduled for a MRI scan on 05/20/2014.  She seen today with Dr. [name] and [name].  Of note is that her brother was recently diagnosed with breast cancer at age 74.  PREVIOUS RADIATION THERAPY: No   PAST MEDICAL HISTORY:  has a past medical history of Osteopenia; Anxiety; Hyperlipidemia; Vitamin D deficiency; Fibromyalgia; Arthritis; Migraine; DVT (deep venous thrombosis) (1980s); Breast cancer of upper-outer quadrant of right female breast (05/08/2014); Irritable bowel syndrome; and Fibromyalgia.     PAST SURGICAL HISTORY:  Past Surgical History  Procedure Laterality Date  . Abdominal hysterectomy  1972  . Ovarian cyst removed  1985  . Breast surgery      lumpectomy fibrocystic breast  . Cholecystectomy  2005  . Cataract extraction       FAMILY HISTORY: family history includes Cancer  in her brother and father; Hypertension in her father; Leukemia in her mother.  Her mother died from leukemia at age 74, in her father died from esophageal cancer 74.  Her brother was recently diagnosed with breast cancer at age 70.   SOCIAL HISTORY:  reports that she has never smoked. She does not have any smokeless tobacco history on file. She reports that she does not drink alcohol or use illicit drugs.  Married, 2 children.  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   ALLERGIES: Amoxicillin; Amoxicillin-pot clavulanate; Esomeprazole magnesium; Omeprazole; and Ranitidine hcl   MEDICATIONS:  Current Outpatient Prescriptions  Medication Sig Dispense Refill  . ALPRAZolam (XANAX) 0.5 MG tablet Take 1 tablet by mouth up to twice a day as needed for severe anxiety (Patient not taking: Reported on 05/15/2014) 30 tablet 0  . amitriptyline (ELAVIL) 10 MG tablet Take 10 mg by mouth at bedtime.    . calcium carbonate 200 MG capsule Take 250 mg by mouth 2 (two) times daily.    . Cholecalciferol (VITAMIN D PO) Take 1 tablet by mouth 2 (two) times daily.    . cyclobenzaprine (FLEXERIL) 10 MG tablet TAKE ONE TABLET AT BEDTIME DAILY AS     NEEDED FOR MUSCLE SPASMS 30 tablet 5  . dicyclomine (BENTYL) 20 MG tablet Take 20 mg by mouth  every 6 (six) hours.    . fish oil-omega-3 fatty acids 1000 MG capsule Take 1 g by mouth 2 (two) times daily.    . fluticasone (FLONASE) 50 MCG/ACT nasal spray USE 1 SPRAY IN EACH NOSTRIL EVERY DAY ASDIRECTED 16 g 2  . HYDROcodone-acetaminophen (NORCO/VICODIN) 5-325 MG per tablet TAKE 1 TABLET BY MOUTH EVERY 6 HOURS AS NEEDED FOR PAIN. 30 tablet 0  . SUMAtriptan (IMITREX) 100 MG tablet TAKE ONE TABLET AS NEEDED FOR MIGRAINE. MAY REPEAT IN TWO HOURS IF NEEDED *MAX OF 2 TABLETS IN 24 HOURS* 10 tablet 12   No current facility-administered medications for this encounter.   Facility-Administered Medications Ordered in Other Encounters  Medication Dose Route  Frequency Provider Last Rate Last Dose  . chlorhexidine (HIBICLENS) 4 % liquid 1 application  1 application Topical Once Autumn Messing III, MD      . Derrill Memo ON 05/16/2014] chlorhexidine (HIBICLENS) 4 % liquid 1 application  1 application Topical Once Autumn Messing III, MD      . Derrill Memo ON 05/16/2014] vancomycin (VANCOCIN) 1,000 mg in sodium chloride 0.9 % 500 mL IVPB  1,000 mg Intravenous On Call to [place] III, MD         REVIEW OF SYSTEMS:  Pertinent items are noted in HPI.    PHYSICAL EXAM: Alert and oriented 74 year old white female appearing younger than her stated age. Wt Readings from Last 3 Encounters:  05/15/14 145 lb 3.2 oz (65.862 kg)  03/01/14 146 lb 8 oz (66.452 kg)  08/15/13 146 lb (66.225 kg)   Temp Readings from Last 3 Encounters:  05/15/14 98.1 F (36.7 C) Oral  03/01/14 98.1 F (36.7 C) Oral  08/15/13 98.1 F (36.7 C) Oral   BP Readings from Last 3 Encounters:  05/15/14 154/68  03/01/14 146/82  08/15/13 130/68   Pulse Readings from Last 3 Encounters:  05/15/14 92  03/01/14 75  08/15/13 72   Head and neck examination: Grossly unremarkable.  Nodes: Without palpable cervical, supraclavicular, or axillary lymphadenopathy.  Chest: Lungs clear.  Breasts: There is a punctate biopsy wound at approximately 12:00 with surrounding ecchymosis along the superior right breast.  No masses are palpable.  Left breast without masses or lesions.    LABORATORY DATA:  Lab Results  Component Value Date   WBC 6.7 05/15/2014   HGB 13.1 05/15/2014   HCT 41.3 05/15/2014   MCV 88.9 05/15/2014   PLT 244 05/15/2014   Lab Results  Component Value Date   NA 143 05/15/2014   K 4.4 05/15/2014   CL 103 04/20/2013   CO2 28 05/15/2014   Lab Results  Component Value Date   ALT 15 05/15/2014   AST 22 05/15/2014   ALKPHOS 70 05/15/2014   BILITOT 0.34 05/15/2014      IMPRESSION: Clinical stage IA (T1 N0 M0) invasive ductal carcinoma of the right breast.  Of note is that her  brother was recently diagnosed with breast cancer at age 66.  She will be consulted for genetic testing.  In the event that she is BRCA positive, she is still a candidate for breast preservation based on her age of diagnosis.  She is scheduled for a MRI scan on February 22.  We discussed local management options which include mastectomy versus partial mastectomy followed by radiation therapy.  HER-2/neu can be repeated on her primary breast specimen.  She may be a candidate for Oncotype DX testing to determine whether not she would benefit from adjuvant chemotherapy.  We discussed the potential acute and late toxicities of radiation therapy.  She may be a candidate for hypofractionated treatment depending on her field separation.   PLAN: As discussed above.  I can see her back following her definitive surgery.   I spent 30  minutes face to face with the patient and more than 50% of that time was spent in counseling and/or coordination of care.

## 2014-05-15 NOTE — Progress Notes (Signed)
[place] Psychosocial Distress Screening Spiritual Care  Visited with Ms Rhoads and her husband in [facility] to introduce [facility] team/resources, reviewing distress screen per protocol.  The patient scored a 6 on the Psychosocial Distress Thermometer which indicates moderate distress. Also assessed for distress and other psychosocial needs.   ONCBCN DISTRESS SCREENING 05/15/2014  Screening Type Initial Screening  Distress experienced in past week (1-10) 6  Emotional problem type Adjusting to illness  Referral to support programs Yes  Other Spiritual Care, Alight    Ms Frysinger and her husband were in good spirits, feeling relief after meeting medical team and receiving more information.  They report strong support from family, friends, [employer], community, and neighbors.  Family and church are  significant sources of meaning.  (One daughter and two grandchildren, 42 and 64, live down the street.  Their family has a 36-year history with [employer]; their daughter is the third generation to work there.  Mr and Ms Alig have been members of their church for 50 years.)  Follow up needed: No. Family has print materials about [employer] team/resources, including contact info.  Please also page as needs arise.  [place], [place]

## 2014-05-16 ENCOUNTER — Telehealth: Payer: Self-pay | Admitting: Oncology

## 2014-05-16 ENCOUNTER — Other Ambulatory Visit: Payer: Medicare Other

## 2014-05-16 ENCOUNTER — Ambulatory Visit (HOSPITAL_BASED_OUTPATIENT_CLINIC_OR_DEPARTMENT_OTHER): Payer: Medicare Other | Admitting: Genetic Counselor

## 2014-05-16 DIAGNOSIS — Z315 Encounter for genetic counseling: Secondary | ICD-10-CM

## 2014-05-16 DIAGNOSIS — Z800 Family history of malignant neoplasm of digestive organs: Secondary | ICD-10-CM | POA: Diagnosis not present

## 2014-05-16 DIAGNOSIS — C50811 Malignant neoplasm of overlapping sites of right female breast: Secondary | ICD-10-CM

## 2014-05-16 DIAGNOSIS — Z806 Family history of leukemia: Secondary | ICD-10-CM

## 2014-05-16 DIAGNOSIS — C50911 Malignant neoplasm of unspecified site of right female breast: Secondary | ICD-10-CM

## 2014-05-16 DIAGNOSIS — C50919 Malignant neoplasm of unspecified site of unspecified female breast: Secondary | ICD-10-CM | POA: Diagnosis not present

## 2014-05-16 DIAGNOSIS — Z803 Family history of malignant neoplasm of breast: Secondary | ICD-10-CM | POA: Diagnosis not present

## 2014-05-16 NOTE — Telephone Encounter (Signed)
oer GM to chge to 3/21 60 @ 4:30-cld pt to adv of time & date of appt-pt understood

## 2014-05-17 ENCOUNTER — Encounter: Payer: Self-pay | Admitting: Genetic Counselor

## 2014-05-17 DIAGNOSIS — Z803 Family history of malignant neoplasm of breast: Secondary | ICD-10-CM | POA: Insufficient documentation

## 2014-05-17 DIAGNOSIS — C50911 Malignant neoplasm of unspecified site of right female breast: Secondary | ICD-10-CM | POA: Insufficient documentation

## 2014-05-17 NOTE — Progress Notes (Signed)
[facility] Patient Visit  REFERRING PROVIDER: Abner Greenspan, MD [address] [place], [place] [address]  PRIMARY PROVIDER:  Loura Pardon, MD  PRIMARY REASON FOR VISIT:  1. Breast cancer, right breast   2. Family history of breast cancer in female     HISTORY OF PRESENT ILLNESS:   Sarah Young, a 74 y.o. female, was seen for a [facility] cancer genetics consultation at the request of Dr. Glori Bickers due to a personal and family history of cancer.  Ms. Riede presents to clinic today to discuss the possibility of a hereditary predisposition to cancer, genetic testing, and to further clarify her future cancer risks, as well as potential cancer risks for family members.   CANCER HISTORY:    Breast cancer of upper-outer quadrant of right female breast   05/03/2014 Breast US Irregular mass with partly ill-defined margins in the 12 o'clock position of the right breast, 14 cm from the nipple, measuring 8 mm x 9 mm x 9 mm. Ultrasound of the right axilla shows no enlarged or abnormal lymph nodes.   05/06/2014 Initial Biopsy Breast, right, needle core biopsy, upper, 12 o'clock, 14 cmfn - INVASIVE DUCTAL CARCINOMA, SEE COMMENT.   05/06/2014 Receptors her2 Equivocal. The ratio is 1.7. The average Her 2 signals per nucleus 4.1Estrogen Receptor: 99%, POSITIVE, STRONG STAINING INTENSITY Progesterone Receptor: 0%, NEGATIVE Proliferation Marker Ki67: 12%   05/08/2014 Initial Diagnosis Breast cancer of upper-outer quadrant of right female breast   Past Medical History  Diagnosis Date   Osteopenia    Anxiety    Hyperlipidemia    Vitamin D deficiency    Fibromyalgia    Arthritis     OA of hands   Migraine    DVT (deep venous thrombosis) 1980s   Breast cancer of upper-outer quadrant of right female breast 05/08/2014   Irritable bowel syndrome    Fibromyalgia     Past Surgical History  Procedure Laterality Date   Abdominal  hysterectomy  1972   Ovarian cyst removed  1985   Breast surgery      lumpectomy fibrocystic breast   Cholecystectomy  2005   Cataract extraction      History   Social History   Marital Status: Married    Spouse Name: N/A   Number of Children: N/A   Years of Education: N/A   Social History Main Topics   Smoking status: Never Smoker    Smokeless tobacco: Not on file   Alcohol Use: No   Drug Use: No   Sexual Activity: Not on file   Other Topics Concern   Not on file   Social History Narrative     FAMILY HISTORY:  During the visit, a 4-generation pedigree was obtained. A copy of the pedigree with be scanned into Epic under the Media tab. Significant family history diagnoses include the following: Family History  Problem Relation Age of Onset   Hypertension Father    Cancer Father 39    esophageal - smoker   Cancer Brother 54    breast CA   Leukemia Mother    Cancer Mother 47    leukemia   Cancer Maternal Aunt 24    leukemia   Cancer Paternal Aunt 72    breast   Ms. Spoerl's ancestry is of Caucasian descent. There is no known Jewish ancestry or consanguinity.  GENETIC COUNSELING ASSESSMENT:  Ms. Kocurek is a 74 y.o. female with  a personal and family history of cancer suggestive of a hereditary predisposition to cancer. We, therefore, discussed and recommended the following at today's visit.   DISCUSSION:  We reviewed the characteristics, features and inheritance patterns of hereditary cancer syndromes. We also discussed genetic testing, including the appropriate family members to test, the process of testing, insurance coverage and turn-around-time for results. We discussed the implications of a negative, positive and/or variant of uncertain significant result. We recommended Ms. Shedden pursue genetic testing for the BreastNext gene panel. The BreastNext gene panel offered by [employer] includes sequencing and rearrangement analysis for the  following 17 genes: ATM, BARD1, BRCA1, BRCA2, BRIP1, CDH1, CHEK2, MRE11A, MUTYH, NBN, NF1, PALB2, PTEN, RAD50, RAD51C, RAD51D, and TP53.  PLAN:  Based on our above recommendation, Ms. Coppinger wished to pursue genetic testing and the blood sample was drawn and will be sent to [facility] for analysis. Results should be available within approximately 6 weeks time, at which point they will be disclosed by telephone to Ms. Croslin, as will any additional recommendations warranted by these results. Lastly, we encouraged Ms. Lucien to remain in contact with cancer genetics annually so that we can continuously update the family history and inform her of any changes in cancer genetics and testing that may be of benefit for this family.   Ms.  Holecek questions were answered to her satisfaction today. Our contact information was provided should additional questions or concerns arise. Thank you for the referral and allowing [place] to share in the care of your patient.   Catherine A. Fine, MS, CGC Certified Psychologist, sport and [email or web address] phone: [phone]  The patient was seen for a total of 45 minutes in face-to-face genetic counseling.  This patient was discussed with [name] who agrees with the above.    ______________________________________________________________________ For Office Staff:  Number of people involved in session including genetic counselor: 2 Was an intern or student involved with case: not applicable

## 2014-05-20 ENCOUNTER — Ambulatory Visit
Admission: RE | Admit: 2014-05-20 | Discharge: 2014-05-20 | Disposition: A | Discharge status: Home / Self Care | Payer: Medicare Other | Source: Ambulatory Visit | Attending: Family Medicine | Admitting: Family Medicine

## 2014-05-20 ENCOUNTER — Telehealth: Payer: Self-pay | Admitting: *Deleted

## 2014-05-20 ENCOUNTER — Other Ambulatory Visit (INDEPENDENT_AMBULATORY_CARE_PROVIDER_SITE_OTHER): Payer: Self-pay | Admitting: General Surgery

## 2014-05-20 ENCOUNTER — Other Ambulatory Visit: Payer: Self-pay | Admitting: Family Medicine

## 2014-05-20 DIAGNOSIS — C50911 Malignant neoplasm of unspecified site of right female breast: Secondary | ICD-10-CM

## 2014-05-20 DIAGNOSIS — C50411 Malignant neoplasm of upper-outer quadrant of right female breast: Secondary | ICD-10-CM

## 2014-05-20 DIAGNOSIS — C50111 Malignant neoplasm of central portion of right female breast: Secondary | ICD-10-CM | POA: Diagnosis not present

## 2014-05-20 MED ORDER — GADOBENATE DIMEGLUMINE 529 MG/ML IV SOLN
7.0000 mL | Freq: Once | INTRAVENOUS | Status: AC | PRN
Start: 2014-05-20 — End: 2014-05-20
  Administered 2014-05-20: 7 mL via INTRAVENOUS

## 2014-05-20 NOTE — Telephone Encounter (Signed)
Spoke with patient from [facility] 05/15/14.  She is doing well.  No questions or concerns at this time.  Encouraged patient to call with any needs or concerns.

## 2014-05-24 ENCOUNTER — Encounter (HOSPITAL_BASED_OUTPATIENT_CLINIC_OR_DEPARTMENT_OTHER): Payer: Self-pay | Admitting: *Deleted

## 2014-05-24 NOTE — Progress Notes (Signed)
Labs done cancer center 2/17./16

## 2014-05-27 ENCOUNTER — Other Ambulatory Visit: Payer: Self-pay | Admitting: Family Medicine

## 2014-05-27 NOTE — Telephone Encounter (Signed)
Px printed for pick up in IN box  

## 2014-05-27 NOTE — Telephone Encounter (Signed)
Ok to refill? Last seen for acute on 03/01/14. Hydrocodone last prescribed on 12/05/13. Xanax last prescribed on 04/20/13. No future appt.

## 2014-05-27 NOTE — Telephone Encounter (Signed)
Called paitent and let her know that rx ready for pick up. Left in front office.

## 2014-05-29 ENCOUNTER — Ambulatory Visit
Admission: RE | Admit: 2014-05-29 | Discharge: 2014-05-29 | Disposition: A | Discharge status: Home / Self Care | Payer: Medicare Other | Source: Ambulatory Visit | Attending: General Surgery | Admitting: General Surgery

## 2014-05-29 DIAGNOSIS — N63 Unspecified lump in breast: Secondary | ICD-10-CM | POA: Diagnosis not present

## 2014-05-29 DIAGNOSIS — C50411 Malignant neoplasm of upper-outer quadrant of right female breast: Secondary | ICD-10-CM

## 2014-05-30 ENCOUNTER — Ambulatory Visit (HOSPITAL_BASED_OUTPATIENT_CLINIC_OR_DEPARTMENT_OTHER): Payer: Medicare Other | Admitting: Certified Registered"

## 2014-05-30 ENCOUNTER — Other Ambulatory Visit (INDEPENDENT_AMBULATORY_CARE_PROVIDER_SITE_OTHER): Payer: Self-pay | Admitting: General Surgery

## 2014-05-30 ENCOUNTER — Ambulatory Visit (HOSPITAL_BASED_OUTPATIENT_CLINIC_OR_DEPARTMENT_OTHER)
Admission: RE | Admit: 2014-05-30 | Discharge: 2014-05-30 | Disposition: A | Discharge status: Home / Self Care | Payer: Medicare Other | Source: Ambulatory Visit | Attending: General Surgery | Admitting: General Surgery

## 2014-05-30 ENCOUNTER — Ambulatory Visit
Admission: RE | Admit: 2014-05-30 | Discharge: 2014-05-30 | Disposition: A | Discharge status: Home / Self Care | Payer: Medicare Other | Source: Ambulatory Visit | Attending: General Surgery | Admitting: General Surgery

## 2014-05-30 ENCOUNTER — Encounter (HOSPITAL_BASED_OUTPATIENT_CLINIC_OR_DEPARTMENT_OTHER): Payer: Self-pay | Admitting: *Deleted

## 2014-05-30 ENCOUNTER — Encounter (HOSPITAL_COMMUNITY)
Admission: RE | Admit: 2014-05-30 | Discharge: 2014-05-30 | Disposition: A | Discharge status: Home / Self Care | Payer: Medicare Other | Source: Ambulatory Visit | Attending: General Surgery | Admitting: General Surgery

## 2014-05-30 ENCOUNTER — Encounter (HOSPITAL_BASED_OUTPATIENT_CLINIC_OR_DEPARTMENT_OTHER)
Admission: RE | Disposition: A | Discharge status: Home / Self Care | Payer: Self-pay | Source: Ambulatory Visit | Attending: General Surgery

## 2014-05-30 DIAGNOSIS — Z803 Family history of malignant neoplasm of breast: Secondary | ICD-10-CM | POA: Insufficient documentation

## 2014-05-30 DIAGNOSIS — G43909 Migraine, unspecified, not intractable, without status migrainosus: Secondary | ICD-10-CM | POA: Insufficient documentation

## 2014-05-30 DIAGNOSIS — Z170 Estrogen receptor positive status [ER+]: Secondary | ICD-10-CM | POA: Insufficient documentation

## 2014-05-30 DIAGNOSIS — C50411 Malignant neoplasm of upper-outer quadrant of right female breast: Secondary | ICD-10-CM

## 2014-05-30 DIAGNOSIS — D241 Benign neoplasm of right breast: Secondary | ICD-10-CM | POA: Diagnosis not present

## 2014-05-30 DIAGNOSIS — C50911 Malignant neoplasm of unspecified site of right female breast: Secondary | ICD-10-CM

## 2014-05-30 DIAGNOSIS — R079 Chest pain, unspecified: Secondary | ICD-10-CM | POA: Diagnosis not present

## 2014-05-30 DIAGNOSIS — Z853 Personal history of malignant neoplasm of breast: Secondary | ICD-10-CM | POA: Diagnosis not present

## 2014-05-30 DIAGNOSIS — Z9889 Other specified postprocedural states: Secondary | ICD-10-CM | POA: Insufficient documentation

## 2014-05-30 DIAGNOSIS — G8918 Other acute postprocedural pain: Secondary | ICD-10-CM | POA: Diagnosis not present

## 2014-05-30 DIAGNOSIS — M1990 Unspecified osteoarthritis, unspecified site: Secondary | ICD-10-CM | POA: Diagnosis not present

## 2014-05-30 DIAGNOSIS — I10 Essential (primary) hypertension: Secondary | ICD-10-CM | POA: Diagnosis not present

## 2014-05-30 DIAGNOSIS — M797 Fibromyalgia: Secondary | ICD-10-CM | POA: Insufficient documentation

## 2014-05-30 DIAGNOSIS — K219 Gastro-esophageal reflux disease without esophagitis: Secondary | ICD-10-CM | POA: Insufficient documentation

## 2014-05-30 HISTORY — DX: Presence of spectacles and contact lenses: Z97.3

## 2014-05-30 HISTORY — PX: RADIOACTIVE SEED GUIDED PARTIAL MASTECTOMY WITH AXILLARY SENTINEL LYMPH NODE BIOPSY: SHX6520

## 2014-05-30 SURGERY — RADIOACTIVE SEED GUIDED PARTIAL MASTECTOMY WITH AXILLARY SENTINEL LYMPH NODE BIOPSY
Anesthesia: Regional | Site: Breast | Laterality: Right

## 2014-05-30 MED ORDER — VANCOMYCIN HCL IN DEXTROSE 1-5 GM/200ML-% IV SOLN
1000.0000 mg | INTRAVENOUS | Status: AC
  Administered 2014-05-30: 1000 mg via INTRAVENOUS

## 2014-05-30 MED ORDER — OXYCODONE HCL 5 MG PO TABS
ORAL_TABLET | ORAL | Status: AC
  Filled 2014-05-30: qty 1

## 2014-05-30 MED ORDER — LACTATED RINGERS IV SOLN
INTRAVENOUS | Status: DC
  Administered 2014-05-30: 11:00:00 via INTRAVENOUS

## 2014-05-30 MED ORDER — OXYCODONE-ACETAMINOPHEN 5-325 MG PO TABS: 1.0000 | ORAL_TABLET | ORAL | Status: DC | PRN

## 2014-05-30 MED ORDER — MIDAZOLAM HCL 2 MG/2ML IJ SOLN
1.0000 mg | INTRAMUSCULAR | Status: DC | PRN
  Administered 2014-05-30 (×2): 1 mg via INTRAVENOUS

## 2014-05-30 MED ORDER — MIDAZOLAM HCL 2 MG/2ML IJ SOLN
INTRAMUSCULAR | Status: AC
  Filled 2014-05-30: qty 2

## 2014-05-30 MED ORDER — PROPOFOL 10 MG/ML IV BOLUS
INTRAVENOUS | Status: DC | PRN
  Administered 2014-05-30: 200 mg via INTRAVENOUS

## 2014-05-30 MED ORDER — HYDROMORPHONE HCL 1 MG/ML IJ SOLN
INTRAMUSCULAR | Status: AC
  Filled 2014-05-30: qty 1

## 2014-05-30 MED ORDER — FENTANYL CITRATE 0.05 MG/ML IJ SOLN
INTRAMUSCULAR | Status: AC
  Filled 2014-05-30: qty 2

## 2014-05-30 MED ORDER — CHLORHEXIDINE GLUCONATE 4 % EX LIQD: 1.0000 "application " | Freq: Once | CUTANEOUS | Status: DC

## 2014-05-30 MED ORDER — OXYCODONE HCL 5 MG/5ML PO SOLN: 5.0000 mg | Freq: Once | ORAL | Status: AC | PRN

## 2014-05-30 MED ORDER — FENTANYL CITRATE 0.05 MG/ML IJ SOLN
INTRAMUSCULAR | Status: AC
  Filled 2014-05-30: qty 4

## 2014-05-30 MED ORDER — FENTANYL CITRATE 0.05 MG/ML IJ SOLN
INTRAMUSCULAR | Status: DC | PRN
  Administered 2014-05-30 (×2): 25 ug via INTRAVENOUS

## 2014-05-30 MED ORDER — VANCOMYCIN HCL IN DEXTROSE 500-5 MG/100ML-% IV SOLN
INTRAVENOUS | Status: AC
  Filled 2014-05-30: qty 200

## 2014-05-30 MED ORDER — LIDOCAINE HCL (CARDIAC) 20 MG/ML IV SOLN
INTRAVENOUS | Status: DC | PRN
  Administered 2014-05-30: 50 mg via INTRAVENOUS

## 2014-05-30 MED ORDER — HYDROMORPHONE HCL 1 MG/ML IJ SOLN
0.2500 mg | INTRAMUSCULAR | Status: DC | PRN
  Administered 2014-05-30: 0.5 mg via INTRAVENOUS

## 2014-05-30 MED ORDER — ONDANSETRON HCL 4 MG/2ML IJ SOLN
INTRAMUSCULAR | Status: DC | PRN
  Administered 2014-05-30: 4 mg via INTRAVENOUS

## 2014-05-30 MED ORDER — BUPIVACAINE HCL (PF) 0.25 % IJ SOLN
INTRAMUSCULAR | Status: DC | PRN
Start: 2014-05-30 — End: 2014-05-30
  Administered 2014-05-30: 19 mL

## 2014-05-30 MED ORDER — DEXAMETHASONE SODIUM PHOSPHATE 4 MG/ML IJ SOLN
INTRAMUSCULAR | Status: DC | PRN
  Administered 2014-05-30: 10 mg via INTRAVENOUS

## 2014-05-30 MED ORDER — FENTANYL CITRATE 0.05 MG/ML IJ SOLN
50.0000 ug | INTRAMUSCULAR | Status: DC | PRN
  Administered 2014-05-30 (×2): 50 ug via INTRAVENOUS

## 2014-05-30 MED ORDER — FENTANYL CITRATE 0.05 MG/ML IJ SOLN
INTRAMUSCULAR | Status: AC
  Filled 2014-05-30: qty 6

## 2014-05-30 MED ORDER — TECHNETIUM TC 99M SULFUR COLLOID FILTERED
1.0000 | Freq: Once | INTRAVENOUS | Status: AC | PRN
  Administered 2014-05-30: 1 via INTRADERMAL

## 2014-05-30 MED ORDER — BUPIVACAINE-EPINEPHRINE (PF) 0.5% -1:200000 IJ SOLN
INTRAMUSCULAR | Status: DC | PRN
  Administered 2014-05-30: 30 mL

## 2014-05-30 MED ORDER — OXYCODONE HCL 5 MG PO TABS
5.0000 mg | ORAL_TABLET | Freq: Once | ORAL | Status: AC | PRN
  Administered 2014-05-30: 5 mg via ORAL

## 2014-05-30 MED ORDER — PROMETHAZINE HCL 25 MG/ML IJ SOLN: 6.2500 mg | INTRAMUSCULAR | Status: DC | PRN

## 2014-05-30 SURGICAL SUPPLY — 42 items
APPLIER CLIP 9.375 MED OPEN (MISCELLANEOUS) ×2
APR CLP MED 9.3 20 MLT OPN (MISCELLANEOUS) ×1
BLADE SURG 15 STRL LF DISP TIS (BLADE) ×1 IMPLANT
BLADE SURG 15 STRL SS (BLADE) ×2
CANISTER SUC SOCK COL 7IN (MISCELLANEOUS) IMPLANT
CANISTER SUCT 1200ML W/VALVE (MISCELLANEOUS) ×1 IMPLANT
CHLORAPREP W/TINT 26ML (MISCELLANEOUS) ×2 IMPLANT
CLIP APPLIE 9.375 MED OPEN (MISCELLANEOUS) ×1 IMPLANT
COVER BACK TABLE 60X90IN (DRAPES) ×2 IMPLANT
COVER MAYO STAND STRL (DRAPES) ×2 IMPLANT
COVER PROBE W GEL 5X96 (DRAPES) ×2 IMPLANT
DECANTER SPIKE VIAL GLASS SM (MISCELLANEOUS) IMPLANT
DEVICE DUBIN W/COMP PLATE 8390 (MISCELLANEOUS) ×2 IMPLANT
DRAPE LAPAROSCOPIC ABDOMINAL (DRAPES) ×2 IMPLANT
DRAPE UTILITY XL STRL (DRAPES) ×2 IMPLANT
ELECT COATED BLADE 2.86 ST (ELECTRODE) ×2 IMPLANT
ELECT REM PT RETURN 9FT ADLT (ELECTROSURGICAL) ×2
ELECTRODE REM PT RTRN 9FT ADLT (ELECTROSURGICAL) ×1 IMPLANT
GLOVE BIO SURGEON STRL SZ7.5 (GLOVE) ×2 IMPLANT
GLOVE SURG SS PI 7.0 STRL IVOR (GLOVE) ×1 IMPLANT
GLOVE SURG SS PI 7.5 STRL IVOR (GLOVE) ×1 IMPLANT
GOWN STRL REUS W/ TWL LRG LVL3 (GOWN DISPOSABLE) ×2 IMPLANT
GOWN STRL REUS W/TWL LRG LVL3 (GOWN DISPOSABLE) ×4
KIT MARKER MARGIN INK (KITS) ×2 IMPLANT
LIQUID BAND (GAUZE/BANDAGES/DRESSINGS) ×2 IMPLANT
NDL HYPO 25X1 1.5 SAFETY (NEEDLE) ×1 IMPLANT
NDL SAFETY ECLIPSE 18X1.5 (NEEDLE) IMPLANT
NEEDLE HYPO 18GX1.5 SHARP (NEEDLE)
NEEDLE HYPO 25X1 1.5 SAFETY (NEEDLE) ×2 IMPLANT
NS IRRIG 1000ML POUR BTL (IV SOLUTION) ×1 IMPLANT
PACK BASIN DAY SURGERY FS (CUSTOM PROCEDURE TRAY) ×2 IMPLANT
PENCIL BUTTON HOLSTER BLD 10FT (ELECTRODE) ×2 IMPLANT
SLEEVE SCD COMPRESS KNEE MED (MISCELLANEOUS) ×2 IMPLANT
SPONGE LAP 18X18 X RAY DECT (DISPOSABLE) ×2 IMPLANT
SUT MON AB 4-0 PC3 18 (SUTURE) ×4 IMPLANT
SUT SILK 2 0 SH (SUTURE) ×1 IMPLANT
SUT VICRYL 3-0 CR8 SH (SUTURE) ×3 IMPLANT
SYR CONTROL 10ML LL (SYRINGE) ×2 IMPLANT
TOWEL OR 17X24 6PK STRL BLUE (TOWEL DISPOSABLE) ×2 IMPLANT
TOWEL OR NON WOVEN STRL DISP B (DISPOSABLE) ×2 IMPLANT
TUBE CONNECTING 20X1/4 (TUBING) ×1 IMPLANT
YANKAUER SUCT BULB TIP NO VENT (SUCTIONS) ×1 IMPLANT

## 2014-05-30 NOTE — Progress Notes (Signed)
Emotional support during breast injections °

## 2014-05-30 NOTE — Anesthesia Postprocedure Evaluation (Signed)
  Anesthesia Post-op Note  Patient: Sarah Young  Procedure(s) Performed: Procedure(s): RADIOACTIVE SEED GUIDED PARTIAL MASTECTOMY WITH AXILLARY SENTINEL LYMPH NODE BIOPSY (Right)  Patient Location: PACU  Anesthesia Type:GA combined with regional for post-op pain  Level of Consciousness: awake and alert   Airway and Oxygen Therapy: Patient Spontanous Breathing  Post-op Pain: none  Post-op Assessment: Post-op Vital signs reviewed  Post-op Vital Signs: Reviewed  Last Vitals:  Filed Vitals:   05/30/14 1430  BP:   Pulse: 72  Temp: 36.5 C  Resp: 18    Complications: No apparent anesthesia complications

## 2014-05-30 NOTE — Progress Notes (Signed)
Assisted Dr. Rob Fitzgerald with right, ultrasound guided, pectoralis block. Side rails up, monitors on throughout procedure. See vital signs in flow sheet. Tolerated Procedure well. 

## 2014-05-30 NOTE — H&P (Signed)
Sarah Young 05/15/2014 7:47 AM Location: [facility] Patient #: [id number] DOB: [birth year] Undefined / Language: Suszanne Young / Race: Undefined Female  History of Present Illness Sarah Young. [name] MD; 05/15/2014 3:14 PM) The patient is a 74 year old female who presents with breast cancer. We are asked to see the patient in consultation by [name] to evaluate her for right breast cancer. The patient is a 74 year old white female who recently went for a routine screening mammogram. At that time she was found to have a mass in the 12 o clock position. this measured 61mm by u/s. It was biopsied and came back as a Grade 1 breast cancer that was ER+, PR- with a Ki 67 of 12%. She denies any breast pain or discharge from her nipple. she does have some family history of breast cancer. She does not take any hormone replacement.   Other Problems Sarah Young [place], RMA; 05/15/2014 7:47 AM) Arthritis Back Pain Cholelithiasis Gastroesophageal Reflux Disease General anesthesia - complications Hemorrhoids Lump In Breast Migraine Headache Oophorectomy Bilateral. Ventral Hernia Repair  Past Surgical History Sarah Young [place], RMA; 05/15/2014 7:47 AM) Appendectomy Breast Biopsy Right. Breast Mass; Local Excision Left. Cataract Surgery Bilateral. Gallbladder Surgery - Laparoscopic Hemorrhoidectomy Hysterectomy (not due to cancer) - Complete  Diagnostic Studies History Sarah Young [place], RMA; 05/15/2014 7:47 AM) Colonoscopy 1-5 years ago Mammogram within last year Pap Smear >5 years ago  Social History Sarah Young [place], RMA; 05/15/2014 7:47 AM) Caffeine use Coffee. No alcohol use No drug use Tobacco use Never smoker.  Family History Sarah Young [place], [place]; 05/15/2014 7:47 AM) Arthritis Mother. Bleeding disorder Mother. Breast Cancer Brother. Cancer Father, Mother. Heart Disease Father. Hypertension Brother, Father, Sister. Ischemic Bowel Disease Father. Migraine  Headache Mother.  Pregnancy / Birth History Sarah Young [place], [place]; 05/15/2014 7:47 AM) Age at menarche 27 years. Age of menopause <45 Gravida 2 Irregular periods Maternal age 64-20 Para 2  Review of Systems Sarah Young [place] RMA; 05/15/2014 7:47 AM) General Not Present- Appetite Loss, Chills, Fatigue, Fever, Night Sweats, Weight Gain and Weight Loss. Skin Present- Change in Wart/Mole. Not Present- Dryness, Hives, Jaundice, New Lesions, Non-Healing Wounds, Rash and Ulcer. HEENT Present- Seasonal Allergies, Sinus Pain and Wears glasses/contact lenses. Not Present- Earache, Hearing Loss, Hoarseness, Nose Bleed, Oral Ulcers, Ringing in the Ears, Sore Throat, Visual Disturbances and Yellow Eyes. Respiratory Not Present- Bloody sputum, Chronic Cough, Difficulty Breathing, Snoring and Wheezing. Breast Present- Breast Mass. Not Present- Breast Pain, Nipple Discharge and Skin Changes. Cardiovascular Present- Leg Cramps. Not Present- Chest Pain, Difficulty Breathing Lying Down, Palpitations, Rapid Heart Rate, Shortness of Breath and Swelling of Extremities. Gastrointestinal Present- Abdominal Pain, Bloating and Hemorrhoids. Not Present- Bloody Stool, Change in Bowel Habits, Chronic diarrhea, Constipation, Difficulty Swallowing, Excessive gas, Gets full quickly at meals, Indigestion, Nausea, Rectal Pain and Vomiting. Female Genitourinary Not Present- Frequency, Nocturia, Painful Urination, Pelvic Pain and Urgency. Musculoskeletal Present- Back Pain, Joint Pain and Muscle Pain. Not Present- Joint Stiffness, Muscle Weakness and Swelling of Extremities. Neurological Present- Headaches. Not Present- Decreased Memory, Fainting, Numbness, Seizures, Tingling, Tremor, Trouble walking and Weakness. Psychiatric Not Present- Anxiety, Bipolar, Change in Sleep Pattern, Depression, Fearful and Frequent crying. Endocrine Present- Cold Intolerance. Not Present- Excessive Hunger, Hair Changes, Heat Intolerance, Hot  flashes and New Diabetes. Hematology Not Present- Easy Bruising, Excessive bleeding, Gland problems, HIV and Persistent Infections.   Physical Exam Sarah Young S. [name] MD; 05/15/2014 3:15 PM) General Mental Status-Alert. General Appearance-Consistent with stated age. Hydration-Well hydrated. Voice-Normal.  Head and Neck  Head-normocephalic, atraumatic with no lesions or palpable masses. Trachea-midline. Thyroid Gland Characteristics - normal size and consistency.  Eye Eyeball - Bilateral-Extraocular movements intact. Sclera/Conjunctiva - Bilateral-No scleral icterus.  Chest and Lung Exam Chest and lung exam reveals -quiet, even and easy respiratory effort with no use of accessory muscles and on auscultation, normal breath sounds, no adventitious sounds and normal vocal resonance. Inspection Chest Wall - Normal. Back - normal.  Breast Note: There is no palpable mass in either breast. There is no palpable axillary, supraclavicular, or cervical lymphadenopathy   Cardiovascular Cardiovascular examination reveals -normal heart sounds, regular rate and rhythm with no murmurs and normal pedal pulses bilaterally.  Abdomen Inspection Inspection of the abdomen reveals - No Hernias. Skin - Scar - no surgical scars. Palpation/Percussion Palpation and Percussion of the abdomen reveal - Soft, Non Tender, No Rebound tenderness, No Rigidity (guarding) and No hepatosplenomegaly. Auscultation Auscultation of the abdomen reveals - Bowel sounds normal.  Neurologic Neurologic evaluation reveals -alert and oriented x 3 with no impairment of recent or remote memory. Mental Status-Normal.  Musculoskeletal Normal Exam - Left-Upper Extremity Strength Normal and Lower Extremity Strength Normal. Normal Exam - Right-Upper Extremity Strength Normal and Lower Extremity Strength Normal.  Lymphatic Head & Neck  General Head & Neck Lymphatics: Bilateral - Description -  Normal. Axillary  General Axillary Region: Bilateral - Description - Normal. Tenderness - Non Tender. Femoral & Inguinal  Generalized Femoral & Inguinal Lymphatics: Bilateral - Description - Normal. Tenderness - Non Tender.    Assessment & Plan Sarah Young S. [name] MD; 05/15/2014 3:18 PM) PRIMARY CANCER OF UPPER OUTER QUADRANT OF RIGHT FEMALE BREAST (174.4  C50.411) Impression: The patient appears to have a small stage I cancer in the upper right breast. I have talked to her in detail about the different options for treatment and at this point she favors breast conservation. I think this is a reasonable choice to treat her cancer. I have discussed with her the risks and benefits of surgery as well as some of the technical aspects and she understands and wishes to proceed. I will plan for a right breast radioactive seed localized lumpectomy and sentinel node biopsy.     Signed by Luella Cook, MD (05/15/2014 3:19 PM)

## 2014-05-30 NOTE — Transfer of Care (Signed)
Immediate Anesthesia Transfer of Care Note  Patient: Sarah Young  Procedure(s) Performed: Procedure(s): RADIOACTIVE SEED GUIDED PARTIAL MASTECTOMY WITH AXILLARY SENTINEL LYMPH NODE BIOPSY (Right)  Patient Location: PACU  Anesthesia Type:General  Level of Consciousness: awake and sedated  Airway & Oxygen Therapy: Patient Spontanous Breathing and Patient connected to face mask oxygen  Post-op Assessment: Report given to RN and Post -op Vital signs reviewed and stable  Post vital signs: Reviewed and stable  Last Vitals:  Filed Vitals:   05/30/14 1322  BP:   Pulse: 83  Temp:   Resp:     Complications: No apparent anesthesia complications

## 2014-05-30 NOTE — Interval H&P Note (Signed)
History and Physical Interval Note:  05/30/2014 10:18 AM  Sarah Young [name]  has presented today for surgery, with the diagnosis of Right Breast Cancer  The various methods of treatment have been discussed with the patient and family. After consideration of risks, benefits and other options for treatment, the patient has consented to  Procedure(s): RADIOACTIVE SEED GUIDED PARTIAL MASTECTOMY WITH AXILLARY SENTINEL LYMPH NODE BIOPSY (Right) as a surgical intervention .  The patient's history has been reviewed, patient examined, no change in status, stable for surgery.  I have reviewed the patient's chart and labs.  Questions were answered to the patient's satisfaction.     TOTH III,Armya Westerhoff S

## 2014-05-30 NOTE — Discharge Instructions (Signed)

## 2014-05-30 NOTE — Anesthesia Preprocedure Evaluation (Signed)
Anesthesia Evaluation  Patient identified by MRN, date of birth, ID band Patient awake    Reviewed: Allergy & Precautions, NPO status , Patient's Chart, lab work & pertinent test results  Airway Mallampati: II  TM Distance: >3 FB Neck ROM: Full    Dental  (+) Teeth Intact, Dental Advisory Given   Pulmonary neg pulmonary ROS,  breath sounds clear to auscultation        Cardiovascular hypertension, Rhythm:Regular Rate:Normal     Neuro/Psych  Headaches, Anxiety    GI/Hepatic Neg liver ROS, GERD-  ,  Endo/Other  negative endocrine ROS  Renal/GU negative Renal ROS     Musculoskeletal  (+) Arthritis -, Fibromyalgia -  Abdominal   Peds  Hematology negative hematology ROS (+)   Anesthesia Other Findings   Reproductive/Obstetrics                             Anesthesia Physical Anesthesia Plan  ASA: II  Anesthesia Plan: General and Regional   Post-op Pain Management:    Induction: Intravenous  Airway Management Planned: LMA  Additional Equipment:   Intra-op Plan:   Post-operative Plan: Extubation in OR  Informed Consent: I have reviewed the patients History and Physical, chart, labs and discussed the procedure including the risks, benefits and alternatives for the proposed anesthesia with the patient or authorized representative who has indicated his/her understanding and acceptance.   Dental advisory given  Plan Discussed with: CRNA  Anesthesia Plan Comments:         Anesthesia Quick Evaluation

## 2014-05-30 NOTE — Op Note (Signed)
05/30/2014  1:18 PM  PATIENT:  Sarah Young  74 y.o. female  PRE-OPERATIVE DIAGNOSIS:  Right Breast Cancer  POST-OPERATIVE DIAGNOSIS:  Right Breast Cancer  PROCEDURE:  Procedure(s): RADIOACTIVE SEED GUIDED PARTIAL MASTECTOMY WITH AXILLARY SENTINEL LYMPH NODE BIOPSY (Right)  SURGEON:  Surgeon(s) and Role:    * Jovita Kussmaul, MD - Primary  PHYSICIAN ASSISTANT:   ASSISTANTS: none   ANESTHESIA:   general  EBL:  Total I/O In: 1300 [I.V.:1300] Out: -   BLOOD ADMINISTERED:none  DRAINS: none   LOCAL MEDICATIONS USED:  MARCAINE     SPECIMEN:  Source of Specimen:  right breast tissue and sentinel node  DISPOSITION OF SPECIMEN:  PATHOLOGY  COUNTS:  YES  TOURNIQUET:  * No tourniquets in log *  DICTATION: .Dragon Dictation  After informed consent was obtained the patient was brought to the operating room and placed in the supine position on the operating room table. After adequate induction of general anesthesia the patient's right chest, breast, and axillary area were prepped with ChloraPrep, allowed to dry, and draped in usual sterile manner. Previously the patient had a I-125 radioactive seed placed in the upper outer quadrant of the right breast to mark an area of breast cancer. Earlier in the day the patient also underwent injection of 1 mCi of technetium sulfur colloid in the subareolar position on the right. At this point the neoprobe was used to identify a hot spot in the right axilla. A small transversely oriented incision was made with a 15 blade knife overlying the hot spot. This incision was carried through the skin and subcutaneous tissue sharply with electrocautery until the axilla was entered. The neoprobe was used to direct a blunt hemostat dissection until a hot lymph node was identified. This was excised sharply with the electrocautery and the lymphatics were clamped with hemostats, divided, and ligated with 3-0 Vicryl ties. Ex vivo counts on the sentinel node #1 were  approximately 1100. No other hot or palpable lymph nodes were identified in the right axilla. The axilla was infiltrated with quarter percent Marcaine. The deep layer of the wound was closed with interrupted 3-0 Vicryl stitches. The skin was closed with a running 4-0 Monocryl subcuticular stitch. Attention was then turned to the right breast. The neoprobe was switched to the iodine 125 setting. The area of radioactivity was localized in the upper outer quadrant. A incision was made transversely overlying this area with a 15 blade knife. The incision was carried through the skin and subcutaneous tissue sharply with electrocautery. Using the neoprobe to check the location of the radioactivity a circular portion of breast tissue was excised sharply with electrocautery around the area. I did take extra tissue from the medial portion of the breast because of a satellite nodule that was seen on MRI. Once the specimen was removed from the patient the radioactivity was confirmed in the specimen and there was no residual radioactivity in the right breast. The dissection was carried all the way to the chest wall. The specimen was oriented with the appropriate paint colors. A specimen radiograph was obtained that showed the clip and seed to be in the specimen. The specimen was then sent to pathology for further evaluation. Hemostasis was achieved using the Bovie electrocautery. The wound was infiltrated with quarter percent Marcaine and irrigated with copious amounts of saline. The cavity was marked with clips. The deep layer of the wound was closed with interrupted 3-0 Vicryl stitches. The skin was closed with interrupted 4-0  Monocryl subcuticular stitches. Dermabond dressings were then applied. The patient tolerated the procedure well. At the end of the case all needle sponge and instrument counts were correct. The patient was then awakened and taken to recovery in stable condition  PLAN OF CARE: Discharge to home after  PACU  PATIENT DISPOSITION:  PACU - hemodynamically stable.   Delay start of Pharmacological VTE agent (>24hrs) due to surgical blood loss or risk of bleeding: not applicable

## 2014-05-30 NOTE — Anesthesia Procedure Notes (Addendum)
Anesthesia Regional Block:  Pectoralis block  Pre-Anesthetic Checklist: ,, timeout performed, Correct Patient, Correct Site, Correct Laterality, Correct Procedure, Correct Position, site marked, Risks and benefits discussed,  Surgical consent,  Pre-op evaluation,  At surgeon's request and post-op pain management  Laterality: Right  Prep: chloraprep       Needles:  Injection technique: Single-shot  Needle Type: Echogenic Needle     Needle Length: 9cm 9 cm Needle Gauge: 21 and 21 G    Additional Needles:  Procedures: ultrasound guided (picture in chart) Pectoralis block Narrative:  Start time: 05/30/2014 11:30 AM End time: 05/30/2014 11:40 AM Anesthesiologist: Suzette Battiest E  Additional Notes: Risks and benefits explained. Pt tolerated without immediate complications.   Procedure Name: LMA Insertion Performed by: Terrance Mass Pre-anesthesia Checklist: Patient identified and Timeout performed Patient Re-evaluated:Patient Re-evaluated prior to inductionOxygen Delivery Method: Circle system utilized Intubation Type: IV induction    Performed by: Terrance Mass Intubation Type: IV induction Ventilation: Mask ventilation without difficulty LMA: LMA inserted LMA Size: 4.0 Number of attempts: 1 Placement Confirmation: positive ETCO2 and breath sounds checked- equal and bilateral Tube secured with: Tape Dental Injury: Teeth and Oropharynx as per pre-operative assessment

## 2014-05-31 ENCOUNTER — Encounter (HOSPITAL_BASED_OUTPATIENT_CLINIC_OR_DEPARTMENT_OTHER): Payer: Self-pay | Admitting: General Surgery

## 2014-06-04 ENCOUNTER — Encounter: Payer: Self-pay | Admitting: Genetic Counselor

## 2014-06-04 DIAGNOSIS — Z803 Family history of malignant neoplasm of breast: Secondary | ICD-10-CM

## 2014-06-04 DIAGNOSIS — C50411 Malignant neoplasm of upper-outer quadrant of right female breast: Secondary | ICD-10-CM

## 2014-06-04 NOTE — Progress Notes (Signed)
[facility]  Genetic Test Results   REFERRING PROVIDER: Dr. Lurline Del  PRIMARY PROVIDER:  Loura Pardon, MD  PRIMARY REASON FOR VISIT:  Personal and family history of breast cancer  GENETIC TEST RESULT:  Testing Laboratory: [facility]  Test Ordered: BreastNext gene panel Date of Report: 06/04/2014 Result: Normal, no pathogenic mutations identified General Interpretation: Reassuring but recommend testing brother with breast cancer  HPI: Ms. Moro was previously seen in the [facility] due to concerns regarding a hereditary predisposition to cancer. Please refer to our prior cancer genetics clinic note for more information regarding Ms. Criss's medical, social and family histories, and our assessment and recommendations, at the time. Ms. Reeser genetic test results and recommendations warranted by these results were recently disclosed to her and are discussed in more detail below.  GENETIC TEST RESULTS: At the time of Ms. Regala's visit, we recommended she pursue genetic testing, which includes sequencing and deletion/duplication analysis of several genes associated with an increased risk for cancer via a gene panel. The BreastNext gene panel offered by [employer] includes sequencing and rearrangement analysis for the following 17 genes: ATM, BARD1, BRCA1, BRCA2, BRIP1, CDH1, CHEK2, MRE11A, MUTYH, NBN, NF1, PALB2, PTEN, RAD50, RAD51C, RAD51D, and TP53. Genetic testing for this gene panel was normal and did not reveal a pathogenic mutation in any of these genes. A copy of the genetic test report will be scanned into Epic under the media tab.   We discussed with Ms. Carstens that current genetic testing is not perfect, and it is, therefore, possible there may be a pathogenic gene mutation in one of these genes that current testing cannot detect, but that chance is small.  We also discusse, that it is possible that another  gene that has not yet been discovered, or that we have not yet tested, is responsible for the cancer diagnoses in her family. It is, therefore, important for Ms. Dost to continue to remain in touch with cancer genetics so that we can continue to offer Ms. Vanderwerf the most up to date genetic testing.   CANCER SCREENING RECOMMENDATIONS: This result is reassuring and indicates that Ms. Faeth likely does not have an increased risk for a future cancer due to a mutation in one of these genes. This normal test also suggests that Ms. Dikes's cancer was most likely not due to an inherited predisposition associated with one of these genes.  Most cancers happen by chance and this negative test suggests that her cancer falls into this category.  We, therefore, recommended she continue to follow the cancer management and screening guidelines provided by her oncology and primary healthcare provider.   RECOMMENDATIONS FOR FAMILY MEMBERS: While these results are reassuring for Ms. Darsey, this test does not tell us anything about Ms. Hettinger's relatives risks. We recommended further genetic testing in Ms. Wahlberg's family as such testing might help [place] be even more confident in Ms. Hammitt's own negative results.  In this family, testing Ms. Rambo's brother who was diagnosed with breast cancer would be the most informative person to test. Please let us know if we can help facilitate testing and/or a referral. Cancer genetic counselors can also be located, by visiting the website of the [employer] ([email or web address]) and searching for a cancer Dietitian by zip code.  FOLLOW-UP: Lastly, we discussed with Ms. Swearengin that cancer genetics is a rapidly advancing field and it is likely that new  genetic tests will be appropriate for her and/or family members in the future. We encouraged her to remain in contact with cancer genetics on an annual basis so we can update her personal and family histories and let her  know of advances in cancer genetics that may benefit this family.   Our contact number was provided. Ms. Fort questions were answered to her satisfaction, and she knows she is welcome to call us at anytime with additional questions or concerns.    Catherine A. Fine, MS, CGC Certified Psychologist, [email or web address] Phone: [phone]

## 2014-06-06 ENCOUNTER — Encounter: Payer: Self-pay | Admitting: *Deleted

## 2014-06-06 NOTE — Progress Notes (Signed)
Ordered oncotype per [name] order.  Faxed requisition to pathology and confirmed receipt with [facility].

## 2014-06-10 ENCOUNTER — Encounter: Payer: Self-pay | Admitting: Radiation Oncology

## 2014-06-10 NOTE — Progress Notes (Signed)
Location of Breast Cancer:  Right Breast Upper Outer Quadrant  Histology per Pathology Report: Diagnosis 05/03/14: Breast, right, needle core biopsy, upper, 12 o'clock, 14 cmfn +- INVASIVE DUCTAL CARCINOMA, SEE COMMENT.  Receptor Status: ER(+99% ), PR (neg,0%), Her2-neu ( Ki-67-12%  )  Did patient present with symptoms (if so, please note symptoms) or was this found on screening mammography?: found on routine mammogram screening   Past/Anticipated interventions by surgeon, if any: Diagnosis 05/30/14 : :1. Lymph node, sentinel, biopsy, right axillary- ONE LYMPH NODE, NEGATIVE FOR TUMOR (0/1). 2. Breast, lumpectomy, right- INVASIVE DUCTAL CARCINOMA, SEE COMMENT.- NEGATIVE FOR LYMPHOVASCULAR INVASIVE.- INVASIVE TUMOR IS 1.0 MM FROM THE NEAREST ANTERIOR MARGIN.- INVASIVE TUMOR IS 2.0 MM FROM THE NEAREST LATERAL MARGIN.- PREVIOUS BIOPSY SITE.- Dr. [name] Toth,III,MD,  Past/Anticipated interventions by medical oncology, if any: Chemotherapy : appt with [name] 06/17/14 Genetic test results 06/04/14   Lymphedema issues, if any: None  Pain issues, if any:  Tenderness at a level 2-1 in her right breast  SAFETY ISSUES:  Prior radiation? NO  Pacemaker/ICD? NO  Possible current pregnancy? NO  Is the patient on methotrexate?  NO  Current Complaints / other details:  Seen in [facility] 05/15/14  Menarche age 23, first live birth age 79. The patient is G2, P2. She underwent total abdominal hysterectomy with bilateral salpingo-oophorectomy 1972. She used hormone replacement for approximately 38 years, until 2011.

## 2014-06-12 ENCOUNTER — Ambulatory Visit
Admission: RE | Admit: 2014-06-12 | Discharge: 2014-06-12 | Disposition: A | Discharge status: Home / Self Care | Payer: Medicare Other | Source: Ambulatory Visit | Attending: Radiation Oncology | Admitting: Radiation Oncology

## 2014-06-12 ENCOUNTER — Encounter: Payer: Self-pay | Admitting: Radiation Oncology

## 2014-06-12 VITALS — BP 131/58 | HR 71 | Temp 97.8°F | Ht 64.0 in | Wt 147.2 lb

## 2014-06-12 DIAGNOSIS — Z170 Estrogen receptor positive status [ER+]: Secondary | ICD-10-CM | POA: Insufficient documentation

## 2014-06-12 DIAGNOSIS — L599 Disorder of the skin and subcutaneous tissue related to radiation, unspecified: Secondary | ICD-10-CM | POA: Diagnosis not present

## 2014-06-12 DIAGNOSIS — Z510 Encounter for antineoplastic radiation therapy: Secondary | ICD-10-CM | POA: Diagnosis not present

## 2014-06-12 DIAGNOSIS — C50411 Malignant neoplasm of upper-outer quadrant of right female breast: Secondary | ICD-10-CM | POA: Insufficient documentation

## 2014-06-12 NOTE — Progress Notes (Signed)
CC: Sarah Pardon, MD Autumn Messing III, MD, Dr. 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  I first saw the patient in consultation at the [facility] on 05/15/2014. At the time of a Tomo screening mammogram on January 20 she was felt to have a possible right breast mass. Additional views on February 5 showed a spiculated mass within the upper-outer quadrant of the right breast which on ultrasound measured 0.8 x 0.9 x 0.9 cm at 12:00. This was 14 cm from the nipple. The axilla was negative on ultrasound. Biopsy of the right breast mass on 05/03/2014 was diagnostic for invasive ductal carcinoma felt to be grade I-II. Her tumor was ER positive at 99%, PR negative at 0% with a Ki-67 of 12%. HER-2/neu was equivocal.  Breast MR on 05/20/2014 showed a 1.6 cm mass in the upper central portion of the right breast with a 5 mm nodule just medial to the known malignancy, suspicious for a tumor satellite.  There was no adenopathy.  On 05/30/2014 she underwent a right partial mastectomy and sentinel lymph node biopsy.  She was found have a 1.4 cm invasive ductal carcinoma with invasive tumor 1.0 mm from the nearest anterior margin and 2.0 mm from the nearest lateral margin.  There was no DCIS seen.  One sentinel lymph node was without evidence for metastatic disease.  I understand that Oncotype DX testing is in progress even though the patient states that she was told that "testing was fine" and she believes that it is time to begin radiation.  Physical examination: Alert and oriented. Filed Vitals:   06/12/14 0939  BP: 131/58  Pulse: 71  Temp: 97.8 F (36.6 C)   Head and neck examination: Grossly unremarkable.  Nodes: Without  palpable cervical, supraclavicular, or axillary lymphadenopathy.  Breasts: There is a partial mastectomy wound along the superior aspect of the right breast which is healing well.  No masses are appreciated.  Left breast without masses or lesions.  Extremities: Without edema.  Impression: Stage IA (T1 N0 M0) invasive ductal carcinoma of the right breast.  I believe that Dr. [name] recently ordered Oncotype DX testing and we will await the results.  We again discussed the role of radiation therapy following conservative surgery in patients based on age and performance status.  In view of her excellent performance status and expected ten-year life expectancy, I would offer her radiation therapy to simply improve her local control.  She very much wants to proceed with radiation therapy which will then be followed by antiestrogen therapy through Dr. [name].  We discussed the potential acute and late toxicities of radiation therapy.  She appears to be a candidate for hypofractionated treatment, and I will give her a one-week boost because of her close surgical margins.  Consent is signed today.  Plan: As above.  I will check on her Oncotype DX testing and then get her scheduled for CT simulation provided that her score returns to be low.  30 minutes was spent face-to-face the patient, primarily counseling the patient and coordinating her care.

## 2014-06-13 ENCOUNTER — Encounter: Payer: Self-pay | Admitting: *Deleted

## 2014-06-13 DIAGNOSIS — C50411 Malignant neoplasm of upper-outer quadrant of right female breast: Secondary | ICD-10-CM | POA: Diagnosis not present

## 2014-06-13 NOTE — Progress Notes (Signed)
Received oncotype score of 25/16%.  Copy to [name] and copy to medical records to scan.

## 2014-06-14 ENCOUNTER — Other Ambulatory Visit: Payer: Self-pay | Admitting: Oncology

## 2014-06-14 ENCOUNTER — Encounter (HOSPITAL_COMMUNITY): Payer: Self-pay

## 2014-06-17 ENCOUNTER — Ambulatory Visit (HOSPITAL_BASED_OUTPATIENT_CLINIC_OR_DEPARTMENT_OTHER): Payer: Medicare Other | Admitting: Oncology

## 2014-06-17 VITALS — BP 165/43 | HR 81 | Temp 97.6°F | Resp 18 | Ht 64.0 in | Wt 146.8 lb

## 2014-06-17 DIAGNOSIS — C50811 Malignant neoplasm of overlapping sites of right female breast: Secondary | ICD-10-CM

## 2014-06-17 DIAGNOSIS — C50411 Malignant neoplasm of upper-outer quadrant of right female breast: Secondary | ICD-10-CM

## 2014-06-17 DIAGNOSIS — M949 Disorder of cartilage, unspecified: Secondary | ICD-10-CM

## 2014-06-17 DIAGNOSIS — Z808 Family history of malignant neoplasm of other organs or systems: Secondary | ICD-10-CM

## 2014-06-17 DIAGNOSIS — Z803 Family history of malignant neoplasm of breast: Secondary | ICD-10-CM

## 2014-06-17 DIAGNOSIS — I10 Essential (primary) hypertension: Secondary | ICD-10-CM

## 2014-06-17 DIAGNOSIS — Z806 Family history of leukemia: Secondary | ICD-10-CM | POA: Diagnosis not present

## 2014-06-17 DIAGNOSIS — M899 Disorder of bone, unspecified: Secondary | ICD-10-CM

## 2014-06-17 NOTE — Progress Notes (Signed)
[place]  Telephone:(336) [phone] Fax:(336) [phone]     ID: Sarah Young DOB: 11-13-1940  MR#: [id number]  HYQ#:[id number]  Patient Care Team: Abner Greenspan, MD as PCP - [place] III, MD as Consulting Physician (General Surgery) Chauncey Cruel, MD as Consulting Physician (Oncology) Arloa Koh, MD as Consulting Physician (Radiation Oncology) Rockwell Germany, RN as Registered Nurse Mauro Kaufmann, RN as Registered Nurse Holley Bouche, NP as Nurse Practitioner (Nurse Practitioner) PCP: Sarah Pardon, MD OTHER MD: Nena Polio M.D.  CHIEF COMPLAINT: Estrogen receptor positive breast cancer  CURRENT TREATMENT: Awaiting adjuvant radiation   BREAST CANCER HISTORY: From the original intake note:  Sarah Young had routine bilateral screening mamm[facility]. This found the breast density to be category C. A possible mass was noted in the right breast, and on 05/03/2014 she underwent right diagnostic mammography with ultrasonography. There was a spiculated mass in the upper outer quadrant of the right breast which was new since the prior mammogram. On physical exam there was mild nodularity noted in the area in question. Ultrasound confirmed a hypoechoic irregular mass measuring 0.9 cm in the upper outer quadrant.  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  MRI of the breasts is scheduled for 05/20/2014. The patient's subsequent history is as detailed below  INTERVAL HISTORY: Sarah Young returns today for follow-up of her breast cancer. Since she had her last visit with me she underwent right lumpectomy and sentinel lymph node sampling, which  showed a 1.4 cm invasive ductal carcinoma, grade 1, with negative margins (the closest 1 mm for the invasive disease) with the single sentinel lymph node clear. Repeat HER-2 was again negative with a signals ratio 1.79 and that number per cell of 3.40. Also since her last visit here her genetics testing has come back, and although all have currently is a verbal report, it was negative.  REVIEW OF SYSTEMS: Sarah Young did well with the surgery, without unusual pain, fever, bleeding, or swelling. The incision still is a little tender sometimes. She has some issues with heartburn, she has "active skin" followed by Dr. Allyson Sabal, and she has a history of migraines which became a little bit more frequent after her surgery, she thinks possibly because of the anesthesia. Otherwise a detailed review of systems today was noncontributory  PAST MEDICAL HISTORY: Past Medical History  Diagnosis Date  . Osteopenia   . Anxiety   . Hyperlipidemia   . Vitamin D deficiency   . Fibromyalgia   . Arthritis     OA of hands  . Migraine   . DVT (deep venous thrombosis) 1980s  . Irritable bowel syndrome   . Fibromyalgia   . Wears glasses   . Breast cancer of upper-outer quadrant of right female breast 05/08/2014    PAST SURGICAL HISTORY: Past Surgical History  Procedure Laterality Date  . Abdominal hysterectomy  1972  . Ovarian cyst removed  1985  . Cholecystectomy  2005  . Cataract extraction      both  . Appendectomy    . Colonoscopy    . Breast surgery  1995    lumpectomy fibrocystic breast-lt  . Radioactive seed guided mastectomy with axillary  sentinel lymph node biopsy Right 05/30/2014    Procedure: RADIOACTIVE SEED GUIDED PARTIAL MASTECTOMY WITH AXILLARY SENTINEL LYMPH NODE BIOPSY;  Surgeon: Autumn Messing III, MD;  Location: [facility];  Service: General;  Laterality: Right;    FAMILY HISTORY Family History  Problem Relation Age of Onset  . Hypertension Father   . Cancer Father 33     esophageal - smoker  . Cancer Brother 79    breast CA  . Leukemia Mother   . Cancer Mother 32    leukemia  . Cancer Maternal Aunt 80    leukemia  . Cancer Paternal Aunt 24    breast   the patient's father died at the age of 54 with gastric cancer. The patient's mother was diagnosed with chronic leukemia at age 50 and died from complications of dementia at age 24 the patient has one brother, diagnosed with breast cancer at the age of 1. She has 2 sisters. There is no other history of breast or ovarian cancer in the family.  GYNECOLOGIC HISTORY:  No LMP recorded. Patient has had a hysterectomy. Menarche age 61, first live birth age 76. The patient is GX P2. She underwent total abdominal hysterectomy with bilateral salpingo-oophorectomy 1972. She used hormone replacement for approximately 38 years, until 2011.  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 Her husband Jori Moll is currently not active in the business. Daughter [name], lives in [place], his Pres. The other daughter, [name], lives in [place] where she works as a Landscape architect. The patient has 3 grandchildren. She attends a local [employer] DIRECTIVES: The patient has named both her husband Jori Moll and her daughter Suanne Marker as Equities trader powers of attorney.   HEALTH MAINTENANCE: History  Substance Use Topics  . Smoking status: Never Smoker   . Smokeless tobacco: Not on file  . Alcohol Use: No     Colonoscopy:  PAP:  Bone density:  Lipid panel:  Allergies  Allergen Reactions  . Amoxicillin Itching    REACTION: reacts opposite effect made infection worse.  Marland Kitchen Amoxicillin-Pot Clavulanate     REACTION: Felt like body turned inside out  . Esomeprazole Magnesium     REACTION: Nausea  . Omeprazole Nausea And Vomiting    REACTION: not effective  . Ranitidine Hcl     REACTION: not effective    Current  Outpatient Prescriptions  Medication Sig Dispense Refill  . ALPRAZolam (XANAX) 0.5 MG tablet TAKE ONE TABLET BY MOUTH TWICE DAILY AS NEEDED FOR SEVERE ANXIETY 30 tablet 0  . amitriptyline (ELAVIL) 10 MG tablet Take 10 mg by mouth at bedtime.    . calcium carbonate 200 MG capsule Take 250 mg by mouth 2 (two) times daily.    . Cholecalciferol (VITAMIN D PO) Take 1 tablet by mouth 2 (two) times daily.    . cyclobenzaprine (FLEXERIL) 10 MG tablet TAKE ONE TABLET AT BEDTIME DAILY AS     NEEDED FOR MUSCLE SPASMS 30 tablet 5  . dicyclomine (BENTYL) 20 MG tablet Take 20 mg by mouth every 6 (six) hours.    . fish oil-omega-3 fatty acids 1000 MG capsule Take 1 g by mouth 2 (two) times daily.    . fluticasone (FLONASE) 50 MCG/ACT nasal spray USE 1 SPRAY IN EACH NOSTRIL EVERY DAY ASDIRECTED 16 g 2  . SUMAtriptan (IMITREX) 100 MG tablet TAKE ONE TABLET AS NEEDED FOR MIGRAINE. MAY  REPEAT IN TWO HOURS IF NEEDED *MAX OF 2 TABLETS IN 24 HOURS* 10 tablet 12   No current facility-administered medications for this visit.   Facility-Administered Medications Ordered in Other Visits  Medication Dose Route Frequency Provider Last Rate Last Dose  . chlorhexidine (HIBICLENS) 4 % liquid 1 application  1 application Topical Once Autumn Messing III, MD      . chlorhexidine (HIBICLENS) 4 % liquid 1 application  1 application Topical Once Autumn Messing III, MD        OBJECTIVE: Older white woman who appears well Filed Vitals:   06/17/14 1559  BP: 165/43  Pulse: 81  Temp: 97.6 F (36.4 C)  Resp: 18     Body mass index is 25.19 kg/(m^2).    ECOG FS:0 - Asymptomatic  The right breast incision is healing nicely, without dehiscence, erythema, swelling, or unusual tenderness. The cosmetic result is excellent. The right axillary incision is also healing well, with no swelling or erythema. There are no palpable masses.  LAB RESULTS:  CMP     Component Value Date/Time   NA 143 05/15/2014 1231   NA 140 04/20/2013 1159   K 4.4  05/15/2014 1231   K 4.5 04/20/2013 1159   CL 103 04/20/2013 1159   CO2 28 05/15/2014 1231   CO2 32 04/20/2013 1159   GLUCOSE 94 05/15/2014 1231   GLUCOSE 88 04/20/2013 1159   BUN 19.2 05/15/2014 1231   BUN 20 04/20/2013 1159   CREATININE 1.2* 05/15/2014 1231   CREATININE 1.2 04/20/2013 1159   CALCIUM 9.6 05/15/2014 1231   CALCIUM 9.8 04/20/2013 1159   PROT 6.6 05/15/2014 1231   PROT 7.1 04/20/2013 1159   ALBUMIN 3.6 05/15/2014 1231   ALBUMIN 3.8 04/20/2013 1159   AST 22 05/15/2014 1231   AST 23 04/20/2013 1159   ALT 15 05/15/2014 1231   ALT 15 04/20/2013 1159   ALKPHOS 70 05/15/2014 1231   ALKPHOS 61 04/20/2013 1159   BILITOT 0.34 05/15/2014 1231   BILITOT 0.7 04/20/2013 1159   GFRNONAA 50.17 10/07/2009 0952   GFRAA 58 05/11/2007 1023    INo results found for: SPEP, UPEP  Lab Results  Component Value Date   WBC 6.7 05/15/2014   NEUTROABS 4.3 05/15/2014   HGB 13.1 05/15/2014   HCT 41.3 05/15/2014   MCV 88.9 05/15/2014   PLT 244 05/15/2014      Chemistry      Component Value Date/Time   NA 143 05/15/2014 1231   NA 140 04/20/2013 1159   K 4.4 05/15/2014 1231   K 4.5 04/20/2013 1159   CL 103 04/20/2013 1159   CO2 28 05/15/2014 1231   CO2 32 04/20/2013 1159   BUN 19.2 05/15/2014 1231   BUN 20 04/20/2013 1159   CREATININE 1.2* 05/15/2014 1231   CREATININE 1.2 04/20/2013 1159      Component Value Date/Time   CALCIUM 9.6 05/15/2014 1231   CALCIUM 9.8 04/20/2013 1159   ALKPHOS 70 05/15/2014 1231   ALKPHOS 61 04/20/2013 1159   AST 22 05/15/2014 1231   AST 23 04/20/2013 1159   ALT 15 05/15/2014 1231   ALT 15 04/20/2013 1159   BILITOT 0.34 05/15/2014 1231   BILITOT 0.7 04/20/2013 1159       No results found for: LABCA2  No components found for: LABCA125  No results for input(s): INR in the last 168 hours.  Urinalysis No results found for: COLORURINE, APPEARANCEUR, LABSPEC, [place], GLUCOSEU, HGBUR, BILIRUBINUR, KETONESUR, PROTEINUR, UROBILINOGEN,  NITRITE,  LEUKOCYTESUR  STUDIES: Mr Breast Bilateral W Wo Contrast  05/21/2014   CLINICAL DATA:  Invasive ductal carcinoma in the 12 o'clock location of the right breast. The patient's brother was diagnosed with breast cancer at age 72.  LABS:  None applicable  EXAM: BILATERAL BREAST MRI WITH AND WITHOUT CONTRAST  TECHNIQUE: Multiplanar, multisequence MR images of both breasts were obtained prior to and following the intravenous administration of 7 ml of Multihance.  THREE-DIMENSIONAL MR IMAGE RENDERING ON INDEPENDENT WORKSTATION:  Three-dimensional MR images were rendered by post-processing of the original MR data on an independent workstation. The three-dimensional MR images were interpreted, and findings are reported in the following complete MRI report for this study. Three dimensional images were evaluated at the independent DynaCad workstation  COMPARISON:  05/03/2014 and earlier from the [facility] study: CT of the abdomen and pelvis 02/17/2010, ultrasound of the abdomen 02/15/2011  FINDINGS: Breast composition: b.  Scattered fibroglandular tissue.  Background parenchymal enhancement: Minimal  Right breast: Within the upper central portion of the right breast there is a spiculated enhancing mass associated with clip artifact. Biopsy tract is seen adjacent to this mass. Mass shows medium wash-in persistent type enhancement kinetics and measures mass measures 1.5 x 1.6 x 1.5 cm. 7 mm medial to this lesion there is a 5 mm enhancing nodule demonstrated rapid wash-in persistent type enhancement kinetics suspicious for enhancing satellite tumor nodule. Rapid wash-in in and persistent type enhancement identified.  Left breast: No mass or abnormal enhancement.  Lymph nodes: No abnormal appearing lymph nodes.  Ancillary findings: Within dome of the liver there is a T2 bright 1.3 cm mass. This does not appear to show significant enhancement on postcontrast images.  IMPRESSION: 1. 1.6 cm mass  in the upper central portion of the right breast associated with clip artifact consistent with known malignancy. 2. 5 mm nodule just medial to the known malignancy is suspicious for tumor satellite nodule. There is no mammographic or previously identified sonographic correlate for this satellite nodule. However, the nodule is felt to be very close to the known malignancy and likely to be removed at time of lumpectomy. This patient is felt to be a good candidate for radioactive seed localization. 3. No adenopathy detected. 4. T2 bright lesion within the liver is consistent with cyst based on enhancement characteristics and comparison with prior studies.  RECOMMENDATION: Treatment plan is recommended for known right breast malignancy.  BI-RADS CATEGORY  6: Known biopsy-proven malignancy.   Electronically Signed   By: Nolon Nations M.D.   On: 05/21/2014 11:42   Nm Sentinel Node Inj-no Rpt (breast)  05/30/2014   CLINICAL DATA: right breast cancer   Sulfur colloid was injected intradermally by the nuclear medicine  technologist for breast cancer sentinel node localization.    Mm Breast Surgical Specimen  05/30/2014   CLINICAL DATA:  74 year old female status post right lumpectomy for invasive ductal carcinoma  EXAM: SPECIMEN RADIOGRAPH OF THE RIGHT BREAST  COMPARISON:  Previous exam(s).  FINDINGS: Status post excision of the right breast. The radioactive seed and biopsy marker clip are present, completely intact, and are marked for pathology.  IMPRESSION: Specimen radiograph of the right breast.   Electronically Signed   By: Pamelia Hoit M.D.   On: 05/30/2014 13:00   Mm Rt Radioactive Seed Loc Mammo Guide  05/29/2014   CLINICAL DATA:  Preoperative needle localization prior to surgical excision of right breast mass.  EXAM: MAMMOGRAPHIC GUIDED RADIOACTIVE SEED LOCALIZATION OF THE  right BREAST  COMPARISON:  Previous exam(s).  FINDINGS: Patient presents for radioactive seed localization prior to surgical excision of  right breast mass. I met with the patient and we discussed the procedure of seed localization including benefits and alternatives. We discussed the high likelihood of a successful procedure. We discussed the risks of the procedure including infection, bleeding, tissue injury and further surgery. We discussed the low dose of radioactivity involved in the procedure. Informed, written consent was given.  The usual time-out protocol was performed immediately prior to the procedure.  Using mammographic guidance, sterile technique, 2% lidocaine and an I-125 radioactive seed, the mass with clip was localized using a superior craniocaudal approach. The follow-up mammogram images confirm the seed in the expected location and are marked for Dr. [name].  Follow-up survey of the patient confirms presence of the radioactive seed.  Order number of I-125 seed:  [id number].  Total activity:  0.250 mCi  Reference Date: 05/17/2014  The patient tolerated the procedure well and was released from the [facility]. She was given instructions regarding seed removal.  IMPRESSION: Radioactive seed localization right breast. No apparent complications.   Electronically Signed   By: Altamese Cabal M.D.   On: 05/29/2014 13:53    ASSESSMENT: 74 y.o. BRCA negative [place] woman status post right breast biopsy 05/03/2014 for a clinical T1b N0, stage IA invasive ductal carcinoma, grade 1 or 2, estrogen receptor positive, progesterone receptor negative, with an MIB-1 of 12%, and HER-2/neu equivocal  (1) right lumpectomy and sentinel lymph node sampling 05/30/2014 showed a pT1c pN0, stage IA invasive ductal carcinoma, grade 1, repeat HER-2 again negative.  (2) genetics testing showed no deleterious mutation  (3) Oncotype score of 25 predicts a risk of distant recurrence of 16% the patient's only systemic treatment is tamoxifen for 5 years; it also predicts a benefit from chemotherapy in the 5% range. Given this marginal benefits the  patient opted against adjuvant chemotherapy  (4) adjuvant radiation to follow surgery  (5) anti-estrogens to follow radiation  PLAN: I spent approximately 45 minutes with Sandar going over her path report first so she understands she has a nonaggressive-looking, slow-growing, early-stage rest cancer. Tumors like her type out as a luminal A subtype 80% of the time. Luminal  A breast cancers generally have a good prognosis and get very little benefit from chemotherapy.  The Oncotype supports that. In general, the benefit of chemotherapy is to reduce the risk by about one third. In this case her risk of recurrence would be reduced by approximately 5%. This means that if I give 100 women like her chemotherapy, 85 of them would not need it, because they were already "cured" by surgery, radiation, and anti-estrogens, and an additional 10 would have the cancer recur despite receiving chemotherapy.  In short 95 out of 100 women like her would get absolutely no benefit from chemotherapy, but only side effects and cost.  Given this very marginal benefit, she is clear that she would not want chemotherapy and we are proceeding directly to radiation. She will see me again in late May. At that time we will discuss anti-estrogens in more detail   Lalita  has a good understanding of the overall plan. She agrees with it. She knows the goal of treatment in her case is cure. She will call with any problems that may develop before her next visit here.  Chauncey Cruel, MD   06/17/2014 4:34 PM Medical Oncology and Hematology [facility] [address]  [place], [place] [address] Tel. [phone]    Fax. [phone]

## 2014-06-18 ENCOUNTER — Telehealth: Payer: Self-pay | Admitting: Oncology

## 2014-06-18 NOTE — Telephone Encounter (Signed)
Called and s/w pt and she is aware of her dr Jana Hakim appointment

## 2014-06-18 NOTE — Telephone Encounter (Signed)
Called and spoke with

## 2014-06-19 ENCOUNTER — Encounter: Payer: Self-pay | Admitting: Radiation Oncology

## 2014-06-19 NOTE — Progress Notes (Signed)
CC: Dr. Gunnar Bulla Magrinat  Chart note: The patient called me today to tell me that she wanted to move ahead with radiation therapy.  We will have her return for CT simulation on April 4 and begin her treatment the following week.

## 2014-06-28 DIAGNOSIS — Z923 Personal history of irradiation: Secondary | ICD-10-CM

## 2014-06-28 HISTORY — DX: Personal history of irradiation: Z92.3

## 2014-07-01 ENCOUNTER — Ambulatory Visit
Admission: RE | Admit: 2014-07-01 | Discharge: 2014-07-01 | Disposition: A | Discharge status: Home / Self Care | Payer: Medicare Other | Source: Ambulatory Visit | Attending: Radiation Oncology | Admitting: Radiation Oncology

## 2014-07-01 ENCOUNTER — Encounter: Payer: Self-pay | Admitting: Oncology

## 2014-07-01 DIAGNOSIS — Z510 Encounter for antineoplastic radiation therapy: Secondary | ICD-10-CM | POA: Diagnosis not present

## 2014-07-01 DIAGNOSIS — C50411 Malignant neoplasm of upper-outer quadrant of right female breast: Secondary | ICD-10-CM | POA: Diagnosis not present

## 2014-07-01 DIAGNOSIS — L599 Disorder of the skin and subcutaneous tissue related to radiation, unspecified: Secondary | ICD-10-CM | POA: Diagnosis not present

## 2014-07-01 DIAGNOSIS — Z170 Estrogen receptor positive status [ER+]: Secondary | ICD-10-CM | POA: Diagnosis not present

## 2014-07-01 NOTE — Progress Notes (Signed)
Complex simulation/treatment planning note: The patient was taken to the CT simulator.  She was placed supine on a custom Vac lock placed on a custom breast board.  Her right breast borders were marked with radiopaque wires along with her partial mastectomy scar.  She was then scanned.  Her normal anatomy was contoured by dosimetry and I contoured her right breast tumor bed.  She was setup to medial and lateral right breast tangents.  I'm prescribing 4250 cGy 17 sessions.  This will  be followed by electron beam boost for a further 1200 cGy in 6 sessions.  She is now ready for 3-D simulation.

## 2014-07-02 ENCOUNTER — Encounter: Payer: Self-pay | Admitting: Radiation Oncology

## 2014-07-02 DIAGNOSIS — C50411 Malignant neoplasm of upper-outer quadrant of right female breast: Secondary | ICD-10-CM | POA: Diagnosis not present

## 2014-07-02 DIAGNOSIS — Z170 Estrogen receptor positive status [ER+]: Secondary | ICD-10-CM | POA: Diagnosis not present

## 2014-07-02 DIAGNOSIS — L599 Disorder of the skin and subcutaneous tissue related to radiation, unspecified: Secondary | ICD-10-CM | POA: Diagnosis not present

## 2014-07-02 DIAGNOSIS — Z510 Encounter for antineoplastic radiation therapy: Secondary | ICD-10-CM | POA: Diagnosis not present

## 2014-07-02 NOTE — Progress Notes (Signed)
3-D simulation note: The patient completed 3-D simulation for treatment to her right breast.  She was set up to medial and lateral right breast tangents.  1 unique [facility] and1 electronic compensation field were used for each tangent for a total of four complex treatment devices.  Dose volume histograms were obtained for the target structures/tumor bed in addition to normal structures including the heart and lungs.  We met our departmental guidelines.  I am prescribing 4250 cGy 17 sessions utilizing 6 MV photons.  This will  be followed by electron beam boost.

## 2014-07-02 NOTE — Progress Notes (Signed)
3-D simulation note: The patient completed 3-D simulation for treatment to her right breast.  She was set up to medial and lateral right breast tangents with 2 complex treatment devices for each tangent for a total of four complex treatment devices.  Dose volume histograms were obtained for the target structures including the right breast tumor bed and normal surrounding structures including the heart and lungs.  We met our departmental guidelines.  I'm prescribing 4250 cGy in 17 sessions utilizing 6 MV photons.  She will then receive a 6 fraction boost.

## 2014-07-08 ENCOUNTER — Ambulatory Visit: Payer: Medicare Other | Admitting: Radiation Oncology

## 2014-07-08 ENCOUNTER — Ambulatory Visit
Admission: RE | Admit: 2014-07-08 | Discharge: 2014-07-08 | Disposition: A | Discharge status: Home / Self Care | Payer: Medicare Other | Source: Ambulatory Visit | Attending: Radiation Oncology | Admitting: Radiation Oncology

## 2014-07-09 ENCOUNTER — Ambulatory Visit: Payer: Medicare Other | Admitting: Radiation Oncology

## 2014-07-09 ENCOUNTER — Ambulatory Visit: Payer: Medicare Other

## 2014-07-10 ENCOUNTER — Ambulatory Visit: Payer: Medicare Other

## 2014-07-10 ENCOUNTER — Ambulatory Visit
Admission: RE | Admit: 2014-07-10 | Discharge: 2014-07-10 | Disposition: A | Discharge status: Home / Self Care | Payer: Medicare Other | Source: Ambulatory Visit | Attending: Radiation Oncology | Admitting: Radiation Oncology

## 2014-07-10 DIAGNOSIS — L599 Disorder of the skin and subcutaneous tissue related to radiation, unspecified: Secondary | ICD-10-CM | POA: Diagnosis not present

## 2014-07-10 DIAGNOSIS — C50411 Malignant neoplasm of upper-outer quadrant of right female breast: Secondary | ICD-10-CM | POA: Diagnosis not present

## 2014-07-10 DIAGNOSIS — Z170 Estrogen receptor positive status [ER+]: Secondary | ICD-10-CM | POA: Diagnosis not present

## 2014-07-10 DIAGNOSIS — Z510 Encounter for antineoplastic radiation therapy: Secondary | ICD-10-CM | POA: Diagnosis not present

## 2014-07-11 ENCOUNTER — Ambulatory Visit: Payer: Medicare Other

## 2014-07-11 ENCOUNTER — Ambulatory Visit
Admission: RE | Admit: 2014-07-11 | Discharge: 2014-07-11 | Disposition: A | Discharge status: Home / Self Care | Payer: Medicare Other | Source: Ambulatory Visit | Attending: Radiation Oncology | Admitting: Radiation Oncology

## 2014-07-11 DIAGNOSIS — Z510 Encounter for antineoplastic radiation therapy: Secondary | ICD-10-CM | POA: Diagnosis not present

## 2014-07-11 DIAGNOSIS — L599 Disorder of the skin and subcutaneous tissue related to radiation, unspecified: Secondary | ICD-10-CM | POA: Diagnosis not present

## 2014-07-11 DIAGNOSIS — Z170 Estrogen receptor positive status [ER+]: Secondary | ICD-10-CM | POA: Diagnosis not present

## 2014-07-11 DIAGNOSIS — C50411 Malignant neoplasm of upper-outer quadrant of right female breast: Secondary | ICD-10-CM

## 2014-07-11 MED ORDER — ALRA NON-METALLIC DEODORANT (RAD-ONC)
1.0000 "application " | Freq: Once | TOPICAL | Status: AC
  Administered 2014-07-11: 1 via TOPICAL

## 2014-07-11 MED ORDER — RADIAPLEXRX EX GEL
Freq: Once | CUTANEOUS | Status: AC
  Administered 2014-07-11: 19:00:00 via TOPICAL

## 2014-07-11 NOTE — Progress Notes (Signed)
Pt here for patient teaching.  Pt given Radiation and You booklet, skin care instructions, Alra deodorant and Radiaplex gel. Reviewed areas of pertinence such as fatigue, skin changes, breast tenderness and breast swelling . Pt able to give teach back of to pat skin, use unscented/gentle soap, use baby wipes, have Imodium on hand, drink plenty of water and sitz bath,apply Radiaplex bid, avoid applying anything to skin within 4 hours of treatment, avoid wearing an under wire bra and to use an electric razor if they must shave. Pt demonstrated understanding and verbalizes understanding of information given and will contact nursing with any questions or concerns.  Given this RN's Business Card and informed that Dr. Valere Dross see's all of his patients on Monday following her treatment, but if he is out of clinic, he will notify her in advance.  She stated understanding.

## 2014-07-12 ENCOUNTER — Ambulatory Visit
Admission: RE | Admit: 2014-07-12 | Discharge: 2014-07-12 | Disposition: A | Discharge status: Home / Self Care | Payer: Medicare Other | Source: Ambulatory Visit | Attending: Radiation Oncology | Admitting: Radiation Oncology

## 2014-07-12 ENCOUNTER — Ambulatory Visit: Payer: Medicare Other

## 2014-07-12 DIAGNOSIS — Z170 Estrogen receptor positive status [ER+]: Secondary | ICD-10-CM | POA: Diagnosis not present

## 2014-07-12 DIAGNOSIS — Z510 Encounter for antineoplastic radiation therapy: Secondary | ICD-10-CM | POA: Diagnosis not present

## 2014-07-12 DIAGNOSIS — L599 Disorder of the skin and subcutaneous tissue related to radiation, unspecified: Secondary | ICD-10-CM | POA: Diagnosis not present

## 2014-07-12 DIAGNOSIS — C50411 Malignant neoplasm of upper-outer quadrant of right female breast: Secondary | ICD-10-CM | POA: Diagnosis not present

## 2014-07-14 ENCOUNTER — Ambulatory Visit: Payer: Medicare Other

## 2014-07-15 ENCOUNTER — Ambulatory Visit: Payer: Medicare Other

## 2014-07-15 ENCOUNTER — Ambulatory Visit
Admission: RE | Admit: 2014-07-15 | Discharge: 2014-07-15 | Disposition: A | Discharge status: Home / Self Care | Payer: Medicare Other | Source: Ambulatory Visit | Attending: Radiation Oncology | Admitting: Radiation Oncology

## 2014-07-15 ENCOUNTER — Encounter: Payer: Self-pay | Admitting: Radiation Oncology

## 2014-07-15 VITALS — BP 125/63 | HR 75 | Resp 16 | Wt 147.2 lb

## 2014-07-15 DIAGNOSIS — C50411 Malignant neoplasm of upper-outer quadrant of right female breast: Secondary | ICD-10-CM

## 2014-07-15 DIAGNOSIS — L599 Disorder of the skin and subcutaneous tissue related to radiation, unspecified: Secondary | ICD-10-CM | POA: Diagnosis not present

## 2014-07-15 DIAGNOSIS — Z170 Estrogen receptor positive status [ER+]: Secondary | ICD-10-CM | POA: Diagnosis not present

## 2014-07-15 DIAGNOSIS — Z510 Encounter for antineoplastic radiation therapy: Secondary | ICD-10-CM | POA: Diagnosis not present

## 2014-07-15 NOTE — Progress Notes (Signed)
Weight and vitals stable. Denies pain. No hyperpigmentation or desquamation of right breast noted. Denies fatigue. Reports using radiaplex and alra as directed.

## 2014-07-15 NOTE — Progress Notes (Signed)
Weekly Management Note:  Site: Right breast Current Dose:  750  cGy Projected Dose: 4250  cGy followed by right breast boost  Narrative: The patient is seen today for routine under treatment assessment. CBCT/MVCT images/port films were reviewed. The chart was reviewed.   She is without complaints today.  She has Radioplex gel to use as needed.  Physical Examination:  Filed Vitals:   07/15/14 0930  BP: 125/63  Pulse: 75  Resp: 16  .  Weight: 147 lb 3.2 oz (66.769 kg).  No significant skin changes.  Impression: Tolerating radiation therapy well.  Plan: Continue radiation therapy as planned.

## 2014-07-16 ENCOUNTER — Encounter: Payer: Self-pay | Admitting: Radiation Oncology

## 2014-07-16 ENCOUNTER — Ambulatory Visit
Admission: RE | Admit: 2014-07-16 | Discharge: 2014-07-16 | Disposition: A | Discharge status: Home / Self Care | Payer: Medicare Other | Source: Ambulatory Visit | Attending: Radiation Oncology | Admitting: Radiation Oncology

## 2014-07-16 DIAGNOSIS — L599 Disorder of the skin and subcutaneous tissue related to radiation, unspecified: Secondary | ICD-10-CM | POA: Diagnosis not present

## 2014-07-16 DIAGNOSIS — Z170 Estrogen receptor positive status [ER+]: Secondary | ICD-10-CM | POA: Diagnosis not present

## 2014-07-16 DIAGNOSIS — Z510 Encounter for antineoplastic radiation therapy: Secondary | ICD-10-CM | POA: Diagnosis not present

## 2014-07-16 DIAGNOSIS — C50411 Malignant neoplasm of upper-outer quadrant of right female breast: Secondary | ICD-10-CM | POA: Diagnosis not present

## 2014-07-16 NOTE — Progress Notes (Signed)
Electron beam simulation note: The patient underwent virtual simulation for her right breast boost.  She was set up en face.  One custom block is constructed to conform the field.  I am prescribing 1200 cGy in 6 sessions utilizing 12 MEV electrons.  The electron beam energy was chosen based on the depth of her tumor bed.  A special port plan was reviewed and accepted.

## 2014-07-17 ENCOUNTER — Ambulatory Visit
Admission: RE | Admit: 2014-07-17 | Discharge: 2014-07-17 | Disposition: A | Discharge status: Home / Self Care | Payer: Medicare Other | Source: Ambulatory Visit | Attending: Radiation Oncology | Admitting: Radiation Oncology

## 2014-07-17 DIAGNOSIS — C50411 Malignant neoplasm of upper-outer quadrant of right female breast: Secondary | ICD-10-CM | POA: Diagnosis not present

## 2014-07-17 DIAGNOSIS — L599 Disorder of the skin and subcutaneous tissue related to radiation, unspecified: Secondary | ICD-10-CM | POA: Diagnosis not present

## 2014-07-17 DIAGNOSIS — Z170 Estrogen receptor positive status [ER+]: Secondary | ICD-10-CM | POA: Diagnosis not present

## 2014-07-17 DIAGNOSIS — Z510 Encounter for antineoplastic radiation therapy: Secondary | ICD-10-CM | POA: Diagnosis not present

## 2014-07-18 ENCOUNTER — Ambulatory Visit
Admission: RE | Admit: 2014-07-18 | Discharge: 2014-07-18 | Disposition: A | Discharge status: Home / Self Care | Payer: Medicare Other | Source: Ambulatory Visit | Attending: Radiation Oncology | Admitting: Radiation Oncology

## 2014-07-18 DIAGNOSIS — L599 Disorder of the skin and subcutaneous tissue related to radiation, unspecified: Secondary | ICD-10-CM | POA: Diagnosis not present

## 2014-07-18 DIAGNOSIS — Z510 Encounter for antineoplastic radiation therapy: Secondary | ICD-10-CM | POA: Diagnosis not present

## 2014-07-18 DIAGNOSIS — Z170 Estrogen receptor positive status [ER+]: Secondary | ICD-10-CM | POA: Diagnosis not present

## 2014-07-18 DIAGNOSIS — C50411 Malignant neoplasm of upper-outer quadrant of right female breast: Secondary | ICD-10-CM | POA: Diagnosis not present

## 2014-07-19 ENCOUNTER — Ambulatory Visit
Admission: RE | Admit: 2014-07-19 | Discharge: 2014-07-19 | Disposition: A | Discharge status: Home / Self Care | Payer: Medicare Other | Source: Ambulatory Visit | Attending: Radiation Oncology | Admitting: Radiation Oncology

## 2014-07-19 DIAGNOSIS — L599 Disorder of the skin and subcutaneous tissue related to radiation, unspecified: Secondary | ICD-10-CM | POA: Diagnosis not present

## 2014-07-19 DIAGNOSIS — C50411 Malignant neoplasm of upper-outer quadrant of right female breast: Secondary | ICD-10-CM | POA: Diagnosis not present

## 2014-07-19 DIAGNOSIS — Z170 Estrogen receptor positive status [ER+]: Secondary | ICD-10-CM | POA: Diagnosis not present

## 2014-07-19 DIAGNOSIS — Z510 Encounter for antineoplastic radiation therapy: Secondary | ICD-10-CM | POA: Diagnosis not present

## 2014-07-20 ENCOUNTER — Ambulatory Visit: Payer: Medicare Other

## 2014-07-21 ENCOUNTER — Ambulatory Visit: Payer: Medicare Other

## 2014-07-22 ENCOUNTER — Ambulatory Visit
Admission: RE | Admit: 2014-07-22 | Discharge: 2014-07-22 | Disposition: A | Discharge status: Home / Self Care | Payer: Medicare Other | Source: Ambulatory Visit | Attending: Radiation Oncology | Admitting: Radiation Oncology

## 2014-07-22 ENCOUNTER — Encounter: Payer: Self-pay | Admitting: Radiation Oncology

## 2014-07-22 VITALS — BP 127/53 | HR 68 | Temp 97.4°F | Ht 64.0 in | Wt 146.9 lb

## 2014-07-22 DIAGNOSIS — Z170 Estrogen receptor positive status [ER+]: Secondary | ICD-10-CM | POA: Diagnosis not present

## 2014-07-22 DIAGNOSIS — C50411 Malignant neoplasm of upper-outer quadrant of right female breast: Secondary | ICD-10-CM | POA: Diagnosis not present

## 2014-07-22 DIAGNOSIS — Z510 Encounter for antineoplastic radiation therapy: Secondary | ICD-10-CM | POA: Diagnosis not present

## 2014-07-22 DIAGNOSIS — L599 Disorder of the skin and subcutaneous tissue related to radiation, unspecified: Secondary | ICD-10-CM | POA: Diagnosis not present

## 2014-07-22 NOTE — Progress Notes (Signed)
Sarah Young has received 8 fractions to her right breast.  Note mild erythema in the upper inner and lateral upper portion of this breast.  She denies any pain, nor itching, but reports some fatigue since Saturday and is questioning increased activity this.

## 2014-07-22 NOTE — Progress Notes (Signed)
Weekly Management Note:  Site: Right breast Current Dose:  2000  cGy Projected Dose: 4250 cGy followed by right breast boost of 1200 cGy 6 sessions    Narrative: The patient is seen today for routine under treatment assessment. CBCT/MVCT images/port films were reviewed. The chart was reviewed.   She had mild to moderate fatigue over the weekend.  She is less fatigued today.  She uses Radioplex gel.  Physical Examination:  Filed Vitals:   07/22/14 1051  BP: 127/53  Pulse: 68  Temp: 97.4 F (36.3 C)  .  Weight: 146 lb 14.4 oz (66.633 kg).  There are no significant skin changes along the right breast.  Impression: Tolerating radiation therapy well.  Plan: Continue radiation therapy as planned.

## 2014-07-23 ENCOUNTER — Ambulatory Visit
Admission: RE | Admit: 2014-07-23 | Discharge: 2014-07-23 | Disposition: A | Discharge status: Home / Self Care | Payer: Medicare Other | Source: Ambulatory Visit | Attending: Radiation Oncology | Admitting: Radiation Oncology

## 2014-07-23 DIAGNOSIS — Z510 Encounter for antineoplastic radiation therapy: Secondary | ICD-10-CM | POA: Diagnosis not present

## 2014-07-23 DIAGNOSIS — L599 Disorder of the skin and subcutaneous tissue related to radiation, unspecified: Secondary | ICD-10-CM | POA: Diagnosis not present

## 2014-07-23 DIAGNOSIS — Z170 Estrogen receptor positive status [ER+]: Secondary | ICD-10-CM | POA: Diagnosis not present

## 2014-07-23 DIAGNOSIS — C50411 Malignant neoplasm of upper-outer quadrant of right female breast: Secondary | ICD-10-CM | POA: Diagnosis not present

## 2014-07-24 ENCOUNTER — Ambulatory Visit
Admission: RE | Admit: 2014-07-24 | Discharge: 2014-07-24 | Disposition: A | Discharge status: Home / Self Care | Payer: Medicare Other | Source: Ambulatory Visit | Attending: Radiation Oncology | Admitting: Radiation Oncology

## 2014-07-24 ENCOUNTER — Telehealth: Payer: Self-pay | Admitting: *Deleted

## 2014-07-24 DIAGNOSIS — Z170 Estrogen receptor positive status [ER+]: Secondary | ICD-10-CM | POA: Diagnosis not present

## 2014-07-24 DIAGNOSIS — C50411 Malignant neoplasm of upper-outer quadrant of right female breast: Secondary | ICD-10-CM | POA: Diagnosis not present

## 2014-07-24 DIAGNOSIS — Z510 Encounter for antineoplastic radiation therapy: Secondary | ICD-10-CM | POA: Diagnosis not present

## 2014-07-24 DIAGNOSIS — L599 Disorder of the skin and subcutaneous tissue related to radiation, unspecified: Secondary | ICD-10-CM | POA: Diagnosis not present

## 2014-07-24 NOTE — Telephone Encounter (Signed)
Left vm for pt to return call to assess needs.

## 2014-07-25 ENCOUNTER — Ambulatory Visit
Admission: RE | Admit: 2014-07-25 | Discharge: 2014-07-25 | Disposition: A | Discharge status: Home / Self Care | Payer: Medicare Other | Source: Ambulatory Visit | Attending: Radiation Oncology | Admitting: Radiation Oncology

## 2014-07-25 DIAGNOSIS — Z170 Estrogen receptor positive status [ER+]: Secondary | ICD-10-CM | POA: Diagnosis not present

## 2014-07-25 DIAGNOSIS — C50411 Malignant neoplasm of upper-outer quadrant of right female breast: Secondary | ICD-10-CM | POA: Diagnosis not present

## 2014-07-25 DIAGNOSIS — L599 Disorder of the skin and subcutaneous tissue related to radiation, unspecified: Secondary | ICD-10-CM | POA: Diagnosis not present

## 2014-07-25 DIAGNOSIS — Z510 Encounter for antineoplastic radiation therapy: Secondary | ICD-10-CM | POA: Diagnosis not present

## 2014-07-26 ENCOUNTER — Ambulatory Visit
Admission: RE | Admit: 2014-07-26 | Discharge: 2014-07-26 | Disposition: A | Discharge status: Home / Self Care | Payer: Medicare Other | Source: Ambulatory Visit | Attending: Radiation Oncology | Admitting: Radiation Oncology

## 2014-07-26 DIAGNOSIS — Z510 Encounter for antineoplastic radiation therapy: Secondary | ICD-10-CM | POA: Diagnosis not present

## 2014-07-26 DIAGNOSIS — Z170 Estrogen receptor positive status [ER+]: Secondary | ICD-10-CM | POA: Diagnosis not present

## 2014-07-26 DIAGNOSIS — C50411 Malignant neoplasm of upper-outer quadrant of right female breast: Secondary | ICD-10-CM | POA: Diagnosis not present

## 2014-07-26 DIAGNOSIS — L599 Disorder of the skin and subcutaneous tissue related to radiation, unspecified: Secondary | ICD-10-CM | POA: Diagnosis not present

## 2014-07-27 ENCOUNTER — Ambulatory Visit: Payer: Medicare Other

## 2014-07-29 ENCOUNTER — Encounter: Payer: Self-pay | Admitting: Radiation Oncology

## 2014-07-29 ENCOUNTER — Ambulatory Visit
Admission: RE | Admit: 2014-07-29 | Discharge: 2014-07-29 | Disposition: A | Discharge status: Home / Self Care | Payer: Medicare Other | Source: Ambulatory Visit | Attending: Radiation Oncology | Admitting: Radiation Oncology

## 2014-07-29 ENCOUNTER — Ambulatory Visit: Admission: RE | Admit: 2014-07-29 | Payer: Medicare Other | Source: Ambulatory Visit | Admitting: Radiation Oncology

## 2014-07-29 VITALS — BP 147/58 | HR 73 | Temp 97.8°F | Resp 20 | Wt 147.2 lb

## 2014-07-29 DIAGNOSIS — Z170 Estrogen receptor positive status [ER+]: Secondary | ICD-10-CM | POA: Diagnosis not present

## 2014-07-29 DIAGNOSIS — C50411 Malignant neoplasm of upper-outer quadrant of right female breast: Secondary | ICD-10-CM | POA: Diagnosis not present

## 2014-07-29 DIAGNOSIS — L599 Disorder of the skin and subcutaneous tissue related to radiation, unspecified: Secondary | ICD-10-CM | POA: Diagnosis not present

## 2014-07-29 DIAGNOSIS — Z510 Encounter for antineoplastic radiation therapy: Secondary | ICD-10-CM | POA: Diagnosis not present

## 2014-07-29 NOTE — Progress Notes (Signed)
Weekly Management Note:  Site: Right breast Current Dose:  3250  cGy Projected Dose: 4250  cGy followed by boost  Narrative: The patient is seen today for routine under treatment assessment. CBCT/MVCT images/port films were reviewed. The chart was reviewed.   She is generally doing well and is without complaints today.  She uses Radioplex gel and also Aquaphor ointment which she has some erythema along her bra line.  Physical Examination:  Filed Vitals:   07/29/14 0946  BP: 147/58  Pulse: 73  Temp: 97.8 F (36.6 C)  Resp: 20  .  Weight: 147 lb 3.2 oz (66.769 kg).  There is mild erythema the skin along her right breast without areas of desquamation.  Impression: Tolerating radiation therapy well.  Plan: Continue radiation therapy as planned.

## 2014-07-29 NOTE — Progress Notes (Addendum)
Weekly rad txs 13 completed right breast, mild erythmea outer  right side of breast where bra  Line is, fatgued, "It's waning a  Little" using radiaplex bid, and aquphor ointtment where erythema  Is, tender at side,skin intact Appetite good 9:48 AM  BP 147/58 mmHg  Pulse 73  Temp(Src) 97.8 F (36.6 C) (Oral)  Resp 20  Wt 147 lb 3.2 oz (66.769 kg)  Wt Readings from Last 3 Encounters:  07/29/14 147 lb 3.2 oz (66.769 kg)  07/22/14 146 lb 14.4 oz (66.633 kg)  06/17/14 146 lb 12.8 oz (66.588 kg)

## 2014-07-30 ENCOUNTER — Ambulatory Visit
Admission: RE | Admit: 2014-07-30 | Discharge: 2014-07-30 | Disposition: A | Discharge status: Home / Self Care | Payer: Medicare Other | Source: Ambulatory Visit | Attending: Radiation Oncology | Admitting: Radiation Oncology

## 2014-07-30 ENCOUNTER — Ambulatory Visit: Admission: RE | Admit: 2014-07-30 | Payer: Medicare Other | Source: Ambulatory Visit | Admitting: Radiation Oncology

## 2014-07-30 DIAGNOSIS — Z170 Estrogen receptor positive status [ER+]: Secondary | ICD-10-CM | POA: Diagnosis not present

## 2014-07-30 DIAGNOSIS — C50411 Malignant neoplasm of upper-outer quadrant of right female breast: Secondary | ICD-10-CM | POA: Diagnosis not present

## 2014-07-30 DIAGNOSIS — Z510 Encounter for antineoplastic radiation therapy: Secondary | ICD-10-CM | POA: Diagnosis not present

## 2014-07-30 DIAGNOSIS — L599 Disorder of the skin and subcutaneous tissue related to radiation, unspecified: Secondary | ICD-10-CM | POA: Diagnosis not present

## 2014-07-31 ENCOUNTER — Ambulatory Visit: Admission: RE | Admit: 2014-07-31 | Payer: Medicare Other | Source: Ambulatory Visit | Admitting: Radiation Oncology

## 2014-07-31 ENCOUNTER — Ambulatory Visit
Admission: RE | Admit: 2014-07-31 | Discharge: 2014-07-31 | Disposition: A | Discharge status: Home / Self Care | Payer: Medicare Other | Source: Ambulatory Visit | Attending: Radiation Oncology | Admitting: Radiation Oncology

## 2014-07-31 DIAGNOSIS — L599 Disorder of the skin and subcutaneous tissue related to radiation, unspecified: Secondary | ICD-10-CM | POA: Diagnosis not present

## 2014-07-31 DIAGNOSIS — C50411 Malignant neoplasm of upper-outer quadrant of right female breast: Secondary | ICD-10-CM | POA: Diagnosis not present

## 2014-07-31 DIAGNOSIS — Z510 Encounter for antineoplastic radiation therapy: Secondary | ICD-10-CM | POA: Diagnosis not present

## 2014-07-31 DIAGNOSIS — Z170 Estrogen receptor positive status [ER+]: Secondary | ICD-10-CM | POA: Diagnosis not present

## 2014-08-01 ENCOUNTER — Ambulatory Visit
Admission: RE | Admit: 2014-08-01 | Discharge: 2014-08-01 | Disposition: A | Discharge status: Home / Self Care | Payer: Medicare Other | Source: Ambulatory Visit | Attending: Radiation Oncology | Admitting: Radiation Oncology

## 2014-08-01 ENCOUNTER — Ambulatory Visit: Payer: Medicare Other

## 2014-08-01 DIAGNOSIS — C50411 Malignant neoplasm of upper-outer quadrant of right female breast: Secondary | ICD-10-CM | POA: Diagnosis not present

## 2014-08-01 DIAGNOSIS — Z510 Encounter for antineoplastic radiation therapy: Secondary | ICD-10-CM | POA: Diagnosis not present

## 2014-08-01 DIAGNOSIS — L599 Disorder of the skin and subcutaneous tissue related to radiation, unspecified: Secondary | ICD-10-CM | POA: Diagnosis not present

## 2014-08-01 DIAGNOSIS — Z170 Estrogen receptor positive status [ER+]: Secondary | ICD-10-CM | POA: Diagnosis not present

## 2014-08-02 ENCOUNTER — Ambulatory Visit
Admission: RE | Admit: 2014-08-02 | Discharge: 2014-08-02 | Disposition: A | Discharge status: Home / Self Care | Payer: Medicare Other | Source: Ambulatory Visit | Attending: Radiation Oncology | Admitting: Radiation Oncology

## 2014-08-02 ENCOUNTER — Ambulatory Visit: Payer: Medicare Other

## 2014-08-02 DIAGNOSIS — L599 Disorder of the skin and subcutaneous tissue related to radiation, unspecified: Secondary | ICD-10-CM | POA: Diagnosis not present

## 2014-08-02 DIAGNOSIS — C50411 Malignant neoplasm of upper-outer quadrant of right female breast: Secondary | ICD-10-CM | POA: Diagnosis not present

## 2014-08-02 DIAGNOSIS — Z510 Encounter for antineoplastic radiation therapy: Secondary | ICD-10-CM | POA: Diagnosis not present

## 2014-08-02 DIAGNOSIS — Z170 Estrogen receptor positive status [ER+]: Secondary | ICD-10-CM | POA: Diagnosis not present

## 2014-08-05 ENCOUNTER — Ambulatory Visit
Admission: RE | Admit: 2014-08-05 | Discharge: 2014-08-05 | Disposition: A | Discharge status: Home / Self Care | Payer: Medicare Other | Source: Ambulatory Visit | Attending: Radiation Oncology | Admitting: Radiation Oncology

## 2014-08-05 ENCOUNTER — Ambulatory Visit: Payer: Medicare Other

## 2014-08-05 ENCOUNTER — Encounter: Payer: Self-pay | Admitting: Radiation Oncology

## 2014-08-05 VITALS — BP 140/48 | HR 70 | Temp 98.1°F | Ht 64.0 in | Wt 149.8 lb

## 2014-08-05 DIAGNOSIS — Z510 Encounter for antineoplastic radiation therapy: Secondary | ICD-10-CM | POA: Diagnosis not present

## 2014-08-05 DIAGNOSIS — L599 Disorder of the skin and subcutaneous tissue related to radiation, unspecified: Secondary | ICD-10-CM | POA: Diagnosis not present

## 2014-08-05 DIAGNOSIS — Z170 Estrogen receptor positive status [ER+]: Secondary | ICD-10-CM | POA: Diagnosis not present

## 2014-08-05 DIAGNOSIS — C50411 Malignant neoplasm of upper-outer quadrant of right female breast: Secondary | ICD-10-CM | POA: Insufficient documentation

## 2014-08-05 MED ORDER — BIAFINE EX EMUL
CUTANEOUS | Status: DC | PRN
  Administered 2014-08-05: 1 via TOPICAL

## 2014-08-05 NOTE — Progress Notes (Signed)
Sarah Young has received 18 fraction sto her right breast.  She reports itching in the right upper, inner quadrant which demonstrates a rash-like appearance.  Erythema noted.  Given Biafine.

## 2014-08-05 NOTE — Progress Notes (Signed)
Weekly Management Note:  Site: Right breast boost Current Dose:  200  cGy Projected Dose: 1200  cGy  Narrative: The patient is seen today for routine under treatment assessment. CBCT/MVCT images/port films were reviewed. The chart was reviewed.   She does report pruritus along the upper inner quadrant of her right breast.  She was given Biafine cream.  Physical Examination:  Filed Vitals:   08/05/14 1021  BP: 140/48  Pulse: 70  Temp: 98.1 F (36.7 C)  .  Weight: 149 lb 12.8 oz (67.949 kg).  There is moderate erythema along the right breast with a papular eruption along the upper inner quadrant (sun exposed skin).  There is patchy dry desquamation but no areas of moist desquamation.  Impression: Tolerating radiation therapy well.  She will finish her treatment next week.  I told she may use hydrocortisone cream along the papular radiation dermatitis where she has pruritus.  Plan: Continue radiation therapy as planned.

## 2014-08-05 NOTE — Addendum Note (Signed)
Encounter addended by: Benn Moulder, RN on: 08/05/2014  7:42 PM<BR>     Documentation filed: Inpatient MAR, Flowsheet VN

## 2014-08-06 ENCOUNTER — Ambulatory Visit
Admission: RE | Admit: 2014-08-06 | Discharge: 2014-08-06 | Disposition: A | Discharge status: Home / Self Care | Payer: Medicare Other | Source: Ambulatory Visit | Attending: Radiation Oncology | Admitting: Radiation Oncology

## 2014-08-06 ENCOUNTER — Ambulatory Visit: Payer: Medicare Other

## 2014-08-06 DIAGNOSIS — Z170 Estrogen receptor positive status [ER+]: Secondary | ICD-10-CM | POA: Diagnosis not present

## 2014-08-06 DIAGNOSIS — C50411 Malignant neoplasm of upper-outer quadrant of right female breast: Secondary | ICD-10-CM | POA: Diagnosis not present

## 2014-08-06 DIAGNOSIS — Z510 Encounter for antineoplastic radiation therapy: Secondary | ICD-10-CM | POA: Diagnosis not present

## 2014-08-06 DIAGNOSIS — L599 Disorder of the skin and subcutaneous tissue related to radiation, unspecified: Secondary | ICD-10-CM | POA: Diagnosis not present

## 2014-08-07 ENCOUNTER — Ambulatory Visit
Admission: RE | Admit: 2014-08-07 | Discharge: 2014-08-07 | Disposition: A | Discharge status: Home / Self Care | Payer: Medicare Other | Source: Ambulatory Visit | Attending: Radiation Oncology | Admitting: Radiation Oncology

## 2014-08-07 ENCOUNTER — Ambulatory Visit: Payer: Medicare Other

## 2014-08-07 DIAGNOSIS — C50411 Malignant neoplasm of upper-outer quadrant of right female breast: Secondary | ICD-10-CM | POA: Diagnosis not present

## 2014-08-07 DIAGNOSIS — Z510 Encounter for antineoplastic radiation therapy: Secondary | ICD-10-CM | POA: Diagnosis not present

## 2014-08-07 DIAGNOSIS — Z170 Estrogen receptor positive status [ER+]: Secondary | ICD-10-CM | POA: Diagnosis not present

## 2014-08-07 DIAGNOSIS — L599 Disorder of the skin and subcutaneous tissue related to radiation, unspecified: Secondary | ICD-10-CM | POA: Diagnosis not present

## 2014-08-08 ENCOUNTER — Ambulatory Visit
Admission: RE | Admit: 2014-08-08 | Discharge: 2014-08-08 | Disposition: A | Discharge status: Home / Self Care | Payer: Medicare Other | Source: Ambulatory Visit | Attending: Radiation Oncology | Admitting: Radiation Oncology

## 2014-08-08 ENCOUNTER — Ambulatory Visit: Payer: Medicare Other

## 2014-08-08 DIAGNOSIS — C50411 Malignant neoplasm of upper-outer quadrant of right female breast: Secondary | ICD-10-CM | POA: Diagnosis not present

## 2014-08-08 DIAGNOSIS — L599 Disorder of the skin and subcutaneous tissue related to radiation, unspecified: Secondary | ICD-10-CM | POA: Diagnosis not present

## 2014-08-08 DIAGNOSIS — Z170 Estrogen receptor positive status [ER+]: Secondary | ICD-10-CM | POA: Diagnosis not present

## 2014-08-08 DIAGNOSIS — Z510 Encounter for antineoplastic radiation therapy: Secondary | ICD-10-CM | POA: Diagnosis not present

## 2014-08-09 ENCOUNTER — Ambulatory Visit: Payer: Medicare Other

## 2014-08-09 ENCOUNTER — Ambulatory Visit
Admission: RE | Admit: 2014-08-09 | Discharge: 2014-08-09 | Disposition: A | Discharge status: Home / Self Care | Payer: Medicare Other | Source: Ambulatory Visit | Attending: Radiation Oncology | Admitting: Radiation Oncology

## 2014-08-09 DIAGNOSIS — Z170 Estrogen receptor positive status [ER+]: Secondary | ICD-10-CM | POA: Diagnosis not present

## 2014-08-09 DIAGNOSIS — C50411 Malignant neoplasm of upper-outer quadrant of right female breast: Secondary | ICD-10-CM | POA: Diagnosis not present

## 2014-08-09 DIAGNOSIS — L599 Disorder of the skin and subcutaneous tissue related to radiation, unspecified: Secondary | ICD-10-CM | POA: Diagnosis not present

## 2014-08-09 DIAGNOSIS — Z510 Encounter for antineoplastic radiation therapy: Secondary | ICD-10-CM | POA: Diagnosis not present

## 2014-08-12 ENCOUNTER — Ambulatory Visit
Admission: RE | Admit: 2014-08-12 | Discharge: 2014-08-12 | Disposition: A | Discharge status: Home / Self Care | Payer: Medicare Other | Source: Ambulatory Visit | Attending: Radiation Oncology | Admitting: Radiation Oncology

## 2014-08-12 ENCOUNTER — Encounter: Payer: Self-pay | Admitting: Radiation Oncology

## 2014-08-12 ENCOUNTER — Ambulatory Visit: Payer: Medicare Other

## 2014-08-12 VITALS — BP 131/53 | HR 81 | Temp 97.5°F | Ht 64.0 in | Wt 147.0 lb

## 2014-08-12 DIAGNOSIS — C50411 Malignant neoplasm of upper-outer quadrant of right female breast: Secondary | ICD-10-CM | POA: Diagnosis not present

## 2014-08-12 DIAGNOSIS — Z510 Encounter for antineoplastic radiation therapy: Secondary | ICD-10-CM | POA: Diagnosis not present

## 2014-08-12 DIAGNOSIS — L599 Disorder of the skin and subcutaneous tissue related to radiation, unspecified: Secondary | ICD-10-CM | POA: Diagnosis not present

## 2014-08-12 DIAGNOSIS — Z170 Estrogen receptor positive status [ER+]: Secondary | ICD-10-CM | POA: Diagnosis not present

## 2014-08-12 NOTE — Progress Notes (Signed)
[facility] End of Treatment Note  Name:Sarah Young  Date: 08/12/2014 KJI:[id number] DOB:03-14-1941   Status:outpatient    CC: Loura Pardon, MD  Dr. Autumn Messing  REFERRING PHYSICIAN:   Dr. 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  12 MEV electrons, right breast boost delivered en face                NARRATIVE: She tolerated treatment well although she did have mild to moderate pruritus along the upper inner quadrant of her breast was treated with Biafine cream.  No areas of moist desquamation.                           PLAN: Routine followup in one month. Patient instructed to call if questions or worsening complaints in interim.

## 2014-08-12 NOTE — Progress Notes (Signed)
Weekly Management Note:  Site: Right breast boost Current Dose:  1200  cGy Projected Dose: 1200  cGy  Narrative: The patient is seen today for routine under treatment assessment. CBCT/MVCT images/port films were reviewed. The chart was reviewed.   She finishes her radiation therapy today.  She is without new complaints today.  She still has pruritus along the upper inner portion of her right breast.  She uses Biafine cream.  Physical Examination:  Filed Vitals:   08/12/14 0938  BP: 131/53  Pulse: 81  Temp: 97.5 F (36.4 C)  .  Weight: 147 lb (66.679 kg).  There is moderate erythema the skin along the right breast with patchy dry desquamation with a papular dermatitis along the upper inner quadrant of her right breast.  No areas of moist desquamation.  Impression: She tolerated the radiation therapy quite well.  I told she may use hydrocortisone cream along areas of pruritus.  Plan: Follow-up visit with me in one month.

## 2014-08-12 NOTE — Progress Notes (Signed)
Sarah Young completes today.  She denies any pain but reports itching in upper, inner portion of the tx. field which demonstrates redness and dryness. Redness also noted in the inframmary fold.  She also reports that she hs fatigued, but does not nap during the day.

## 2014-08-13 ENCOUNTER — Ambulatory Visit: Payer: Medicare Other

## 2014-08-14 ENCOUNTER — Ambulatory Visit: Payer: Medicare Other

## 2014-08-19 ENCOUNTER — Telehealth: Payer: Self-pay | Admitting: *Deleted

## 2014-08-19 NOTE — Telephone Encounter (Signed)
Spoke with patient to follow up with her post XRT.  She is doing well. She states she still has some itching but it is getting better.  She is aware of her appointment for next week with [name].  Encouraged her to call with any needs or concerns.

## 2014-08-27 ENCOUNTER — Ambulatory Visit (HOSPITAL_BASED_OUTPATIENT_CLINIC_OR_DEPARTMENT_OTHER): Payer: Medicare Other | Admitting: Oncology

## 2014-08-27 ENCOUNTER — Encounter: Payer: Self-pay | Admitting: *Deleted

## 2014-08-27 VITALS — BP 143/71 | HR 74 | Temp 98.4°F | Resp 18 | Ht 64.0 in | Wt 147.2 lb

## 2014-08-27 DIAGNOSIS — M949 Disorder of cartilage, unspecified: Secondary | ICD-10-CM

## 2014-08-27 DIAGNOSIS — C50411 Malignant neoplasm of upper-outer quadrant of right female breast: Secondary | ICD-10-CM

## 2014-08-27 DIAGNOSIS — Z170 Estrogen receptor positive status [ER+]: Secondary | ICD-10-CM

## 2014-08-27 DIAGNOSIS — I10 Essential (primary) hypertension: Secondary | ICD-10-CM

## 2014-08-27 DIAGNOSIS — M899 Disorder of bone, unspecified: Secondary | ICD-10-CM

## 2014-08-27 MED ORDER — TAMOXIFEN CITRATE 20 MG PO TABS: 20.0000 mg | ORAL_TABLET | Freq: Every day | ORAL | Status: DC

## 2014-08-27 NOTE — Progress Notes (Signed)
Met with pt after completion of xrt. Pt denies needs and without complaints. Encourage pt to call with needs. Received verbal understanding.

## 2014-08-27 NOTE — Progress Notes (Signed)
[facility]  Telephone:([phone]) [phone] Fax:(336) [phone]     ID: KHIA DIETERICH DOB: [birth year]  MR#: [id number]  GYI#:[id number]  Patient Care Team: Abner Greenspan, MD as PCP - [place] III, MD as Consulting Physician (General Surgery) Chauncey Cruel, MD as Consulting Physician (Oncology) Arloa Koh, MD as Consulting Physician (Radiation Oncology) Rockwell Germany, RN as Registered Nurse Mauro Kaufmann, RN as Registered Nurse Holley Bouche, NP as Nurse Practitioner (Nurse Practitioner) PCP: Loura Pardon, MD OTHER MD: Nena Polio M.D.  CHIEF COMPLAINT: Estrogen receptor positive breast cancer  CURRENT TREATMENT: tamoxifen   BREAST CANCER HISTORY: From the original intake note:  Earla had routine bilateral screening mammography [facility] 17 2014. This found the breast density to be category C. A possible mass was noted in the right breast, and on 05/03/2014 she underwent right diagnostic mammography with ultrasonography. There was a spiculated mass in the upper outer quadrant of the right breast which was new since the prior mammogram. On physical exam there was mild nodularity noted in the area in question. Ultrasound confirmed a hypoechoic irregular mass measuring 0.9 cm in the upper outer quadrant.  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  MRI of the breasts is scheduled for 05/20/2014. The patient's subsequent history is as detailed below  INTERVAL HISTORY: Chasmine returns today for follow-up of her breast cancer. Since her last visit here she completed her radiation treatments. She generally did well with these, with some "burning and  itching". However after she finishes she had significant fatigue, to the point that this holiday weekend she stayed home and did not go to their [place]. She did go to her granddaughter's graduation. She has continued to work full-time.  REVIEW OF SYSTEMS: She complains of mild insomnia. She has issues regarding heartburn. Sometimes she gets abdominal cramps. She has fibromyalgia and hurts "here and there in everywhere" on and off in a very irregular pattern. Aside from this a detailed review of systems today was noncontributory  PAST MEDICAL HISTORY: Past Medical History  Diagnosis Date  . Osteopenia   . Anxiety   . Hyperlipidemia   . Vitamin D deficiency   . Fibromyalgia   . Arthritis     OA of hands  . Migraine   . DVT (deep venous thrombosis) 1980s  . Irritable bowel syndrome   . Fibromyalgia   . Wears glasses   . Breast cancer of upper-outer quadrant of right female breast 05/08/2014    PAST SURGICAL HISTORY: Past Surgical History  Procedure Laterality Date  . Abdominal hysterectomy  1972  . Ovarian cyst removed  1985  . Cholecystectomy  2005  . Cataract extraction      both  . Appendectomy    . Colonoscopy    . Breast surgery  1995    lumpectomy fibrocystic breast-lt  . Radioactive seed guided mastectomy with axillary sentinel lymph node biopsy Right 05/30/2014    Procedure: RADIOACTIVE SEED GUIDED PARTIAL MASTECTOMY WITH AXILLARY SENTINEL LYMPH NODE BIOPSY;  Surgeon: Autumn Messing III, MD;  Location: [facility];  Service: General;  Laterality: Right;    FAMILY HISTORY Family History  Problem Relation Age of Onset  .  Hypertension Father   . Cancer Father 45    esophageal - smoker  . Cancer Brother 27    breast CA  . Leukemia Mother   . Cancer Mother 52    leukemia  . Cancer Maternal Aunt 80    leukemia  . Cancer Paternal Aunt 78    breast   the patient's father died at the age of 64 with gastric cancer. The patient's mother was diagnosed with  chronic leukemia at age 18 and died from complications of dementia at age 18 the patient has one brother, diagnosed with breast cancer at the age of 61. She has 2 sisters. There is no other history of breast or ovarian cancer in the family.  GYNECOLOGIC HISTORY:  No LMP recorded. Patient has had a hysterectomy. Menarche age 18, first live birth age 89. The patient is GX P2. She underwent total abdominal hysterectomy with bilateral salpingo-oophorectomy 1972. She used hormone replacement for approximately 38 years, until 2011.  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 Her husband Jori Moll is currently not active in the business. Daughter [name], lives in [place], his Pres. The other daughter, [name], lives in [place] where she works as a Landscape architect. The patient has 3 grandchildren. She attends a local [place] DIRECTIVES: The patient has named both her husband Jori Moll and her daughter Suanne Marker as Equities trader powers of attorney.   HEALTH MAINTENANCE: History  Substance Use Topics  . Smoking status: Never Smoker   . Smokeless tobacco: Not on file  . Alcohol Use: No     Colonoscopy:  PAP:  Bone density:  Lipid panel:  Allergies  Allergen Reactions  . Amoxicillin Itching    REACTION: reacts opposite effect made infection worse.  Marland Kitchen Amoxicillin-Pot Clavulanate     REACTION: Felt like body turned inside out  . Esomeprazole Magnesium     REACTION: Nausea  . Omeprazole Nausea And Vomiting    REACTION: not effective  . Ranitidine Hcl     REACTION: not effective    Current Outpatient Prescriptions  Medication Sig Dispense Refill  . ALPRAZolam (XANAX) 0.5 MG tablet TAKE ONE TABLET BY MOUTH TWICE DAILY AS NEEDED FOR SEVERE ANXIETY 30 tablet 0  . amitriptyline (ELAVIL) 10 MG tablet Take 10 mg by mouth at bedtime.    . calcium carbonate 200 MG capsule Take 250 mg by  mouth 2 (two) times daily.    . Cholecalciferol (VITAMIN D PO) Take 1 tablet by mouth 2 (two) times daily.    . cyclobenzaprine (FLEXERIL) 10 MG tablet TAKE ONE TABLET AT BEDTIME DAILY AS     NEEDED FOR MUSCLE SPASMS 30 tablet 5  . dicyclomine (BENTYL) 20 MG tablet Take 20 mg by mouth every 6 (six) hours.    . fish oil-omega-3 fatty acids 1000 MG capsule Take 1 g by mouth 2 (two) times daily.    . fluticasone (FLONASE) 50 MCG/ACT nasal spray USE 1 SPRAY IN EACH NOSTRIL EVERY DAY ASDIRECTED 16 g 2  . SUMAtriptan (IMITREX) 100 MG tablet TAKE ONE TABLET AS NEEDED FOR MIGRAINE. MAY REPEAT IN TWO HOURS IF NEEDED *MAX OF 2 TABLETS IN 24 HOURS* 10 tablet 12   No current facility-administered medications for this visit.   Facility-Administered Medications Ordered in Other Visits  Medication Dose Route Frequency Provider Last Rate Last Dose  . chlorhexidine (HIBICLENS) 4 % liquid 1 application  1  application Topical Once Autumn Messing III, MD      . chlorhexidine (HIBICLENS) 4 % liquid 1 application  1 application Topical Once Autumn Messing III, MD        OBJECTIVE: Older white woman in no acute distress Filed Vitals:   08/27/14 1615  BP: 143/71  Pulse: 74  Temp: 98.4 F (36.9 C)  Resp: 18     Body mass index is 25.25 kg/(m^2).    ECOG FS:1 - Symptomatic but completely ambulatory  Sclerae unicteric, pupils round and equal Oropharynx clear and moist-- no thrush or other lesions No cervical or supraclavicular adenopathy Lungs no rales or rhonchi Heart regular rate and rhythm Abd soft, nontender, positive bowel sounds MSK no focal spinal tenderness, no upper extremity lymphedema Neuro: nonfocal, well oriented, appropriate affect Breasts: the right breast is status post lumpectomy and radiation. There is a minimal blush only over the scar area. There is no skin desquamation. There are no lumps or other signs of recurrence or residual disease. The right axilla is benign per the left breast is  unremarkable.    LAB RESULTS:  CMP     Component Value Date/Time   NA 143 05/15/2014 1231   NA 140 04/20/2013 1159   K 4.4 05/15/2014 1231   K 4.5 04/20/2013 1159   CL 103 04/20/2013 1159   CO2 28 05/15/2014 1231   CO2 32 04/20/2013 1159   GLUCOSE 94 05/15/2014 1231   GLUCOSE 88 04/20/2013 1159   BUN 19.2 05/15/2014 1231   BUN 20 04/20/2013 1159   CREATININE 1.2* 05/15/2014 1231   CREATININE 1.2 04/20/2013 1159   CALCIUM 9.6 05/15/2014 1231   CALCIUM 9.8 04/20/2013 1159   PROT 6.6 05/15/2014 1231   PROT 7.1 04/20/2013 1159   ALBUMIN 3.6 05/15/2014 1231   ALBUMIN 3.8 04/20/2013 1159   AST 22 05/15/2014 1231   AST 23 04/20/2013 1159   ALT 15 05/15/2014 1231   ALT 15 04/20/2013 1159   ALKPHOS 70 05/15/2014 1231   ALKPHOS 61 04/20/2013 1159   BILITOT 0.34 05/15/2014 1231   BILITOT 0.7 04/20/2013 1159   GFRNONAA 50.17 10/07/2009 0952   GFRAA 58 05/11/2007 1023    INo results found for: SPEP, UPEP  Lab Results  Component Value Date   WBC 6.7 05/15/2014   NEUTROABS 4.3 05/15/2014   HGB 13.1 05/15/2014   HCT 41.3 05/15/2014   MCV 88.9 05/15/2014   PLT 244 05/15/2014      Chemistry      Component Value Date/Time   NA 143 05/15/2014 1231   NA 140 04/20/2013 1159   K 4.4 05/15/2014 1231   K 4.5 04/20/2013 1159   CL 103 04/20/2013 1159   CO2 28 05/15/2014 1231   CO2 32 04/20/2013 1159   BUN 19.2 05/15/2014 1231   BUN 20 04/20/2013 1159   CREATININE 1.2* 05/15/2014 1231   CREATININE 1.2 04/20/2013 1159      Component Value Date/Time   CALCIUM 9.6 05/15/2014 1231   CALCIUM 9.8 04/20/2013 1159   ALKPHOS 70 05/15/2014 1231   ALKPHOS 61 04/20/2013 1159   AST 22 05/15/2014 1231   AST 23 04/20/2013 1159   ALT 15 05/15/2014 1231   ALT 15 04/20/2013 1159   BILITOT 0.34 05/15/2014 1231   BILITOT 0.7 04/20/2013 1159       No results found for: LABCA2  No components found for: LABCA125  No results for input(s): INR in the last 168 hours.  Urinalysis No  results found for: COLORURINE, APPEARANCEUR, LABSPEC, PHURINE, GLUCOSEU, HGBUR, BILIRUBINUR, KETONESUR, PROTEINUR, UROBILINOGEN, NITRITE, LEUKOCYTESUR  STUDIES: No results found.  ASSESSMENT: 74 y.o. BRCA negative [place] woman status post right breast biopsy 05/03/2014 for a clinical T1b N0, stage IA invasive ductal carcinoma, grade 1 or 2, estrogen receptor positive, progesterone receptor negative, with an MIB-1 of 12%, and HER-2/neu equivocal  (1) right lumpectomy and sentinel lymph node sampling 05/30/2014 showed a pT1c pN0, stage IA invasive ductal carcinoma, grade 1, repeat HER-2 again negative.  (2) genetics testing showed no deleterious mutation  (3) Oncotype score of 25 predicts a risk of distant recurrence of 16% ifthe patient's only systemic treatment is tamoxifen for 5 years; it also predicts a benefit from chemotherapy in the 5% range. Given this marginal benefit the patient opted against adjuvant chemotherapy  (4) adjuvant radiation completed 08/12/2014: Right breast 4250 cGy in 17 sessions, right breast boost 1200 cGy in 6 sessions  (5) to start tamoxifen 10/10/2014  PLAN: I spent approximately 15 minutes with Bret today reviewing her diagnosis, treatment so far, prognosis, and antiestrogen choices. She has completed the local treatment for her breast cancerin she has a low risk of the cancer coming back in the breast. However she has not so far done anything in terms of systemic treatment.  We did review the possible benefits of chemotherapy, and while they are measurable, they are marginal. For that reason she opted against chemotherapy and she is now ready to start anti-estrogens, which will be her only form of systemic therapy.  The Oncotype score of 25 predicts a risk of 16% of outside the breast recurrence within 10 years if her only systemic treatment is tamoxifen for 5 years. We discussed the difference between tamoxifen and the aromatase inhibitors and she  has a good understanding of the possible toxicities side effects and complications of both these agents.  She is very much reluctant to consider the aromatase inhibitors because she already has fibromyalgia and osteopenia. These can only get worse on those agents. I think it would be very difficult for her to know if she was intolerant of anastrozole versus having of flare of the fibromyalgia problem.  On the other hand as far as tamoxifen is concerned she status post hysterectomy so endometrial carcinoma and endometrial polyps are not a concern. Furthermore she took hormone replacement for more than 30 years with no clots. Accordingly her risk of clotting from tamoxifen is currently particularly low.  In short I think tamoxifen would be a better choice for her and she agrees. She is not quite ready to start because she only just completed radiation 2 weeks ago.  Accordingly she will start tamoxifen on [place] Day. Incidentally I am going to call it in to [facility], which would be I think quite a bit cheaper than her usual pharmacy.  Amaria  has a good understanding of ththis overall plan. She agrees with it. She knows the goal of treatment in her case is cure. She will call with any problems that may develop before her next visit here.  Chauncey Cruel, MD   08/27/2014 4:45 PM Medical Oncology and Hematology [facility] [address] [place], [place] [address] Tel. [phone]    Fax. [phone]

## 2014-08-28 ENCOUNTER — Telehealth: Payer: Self-pay | Admitting: Oncology

## 2014-08-28 NOTE — Telephone Encounter (Signed)
Left message to confirm appointment for October.Mailed calendar. °

## 2014-08-29 DIAGNOSIS — C50411 Malignant neoplasm of upper-outer quadrant of right female breast: Secondary | ICD-10-CM | POA: Diagnosis not present

## 2014-09-11 ENCOUNTER — Other Ambulatory Visit: Payer: Self-pay | Admitting: Family Medicine

## 2014-09-11 NOTE — Telephone Encounter (Signed)
Please schedule annual exam (or f/u if she prefers) next winter - dec or later  Refill until then

## 2014-09-11 NOTE — Telephone Encounter (Signed)
Electronic refill request, no recent/future appt., please advise  

## 2014-09-12 NOTE — Telephone Encounter (Signed)
F/u appt scheduled in Dec and med refilled

## 2014-09-13 ENCOUNTER — Encounter: Payer: Self-pay | Admitting: Radiation Oncology

## 2014-09-18 ENCOUNTER — Ambulatory Visit
Admission: RE | Admit: 2014-09-18 | Discharge: 2014-09-18 | Disposition: A | Discharge status: Home / Self Care | Payer: Medicare Other | Source: Ambulatory Visit | Attending: Radiation Oncology | Admitting: Radiation Oncology

## 2014-09-18 ENCOUNTER — Encounter: Payer: Self-pay | Admitting: Radiation Oncology

## 2014-09-18 VITALS — BP 120/73 | HR 85 | Temp 97.6°F | Ht 64.0 in | Wt 147.1 lb

## 2014-09-18 DIAGNOSIS — C50411 Malignant neoplasm of upper-outer quadrant of right female breast: Secondary | ICD-10-CM | POA: Insufficient documentation

## 2014-09-18 HISTORY — DX: Personal history of irradiation: Z92.3

## 2014-09-18 MED ORDER — BIAFINE EX EMUL
CUTANEOUS | Status: DC | PRN
  Administered 2014-09-18: 10:00:00 via TOPICAL

## 2014-09-18 NOTE — Progress Notes (Signed)
CC: Dr. Autumn Messing III  Follow-up note:  Ms. Schmid returns today approximately 5 weeks following completion of radiation therapy for conservative surgery in the management of her T1 N0 invasive ductal/DCIS of the right breast.  2 weeks after her hypofractionated radiation therapy she developed severe fatigue.  This continues to improve.  She is pleased with her cosmesis.  She saw [name] he will start her on adjuvant tamoxifen beginning July 14.  She will see [name] for a follow-up visit in October and she will see Dr. [name] for a follow-up visit in October as well.  Physical examination: Alert and oriented. Filed Vitals:   09/18/14 0856  BP: 120/73  Pulse: 85  Temp: 97.6 F (36.4 C)   Nodes: There is no palpable cervical, supraclavicular, or axillary lymphadenopathy.  Breasts: There is residual erythema along the right breast with minimal thickening.  No masses are appreciated.  Left breast without masses or lesions.  Extremities: Without edema.  Impression: Satisfactory progress.  Plan: She will start adjuvant tamoxifen in a few weeks.  She will see Dr. [name] and Dr. [name] for follow-up visits in approximately 3 months.  I told that she should have mammography no later than January 2017.  I've not scheduled her for a formal follow-up visit, but I would be more than happy to see her in the future should the need arise.

## 2014-09-18 NOTE — Progress Notes (Signed)
Sarah Young reports that at the end of xrt she had radiation fatigue 'a couple" of weeks after the end of her tx phase.  She has lingering fatigue.   As a result, [name] delayed the start of Tamoxifen until 10/10/14.  Vitals are stable.  Hs mild hypopigmentation and mild erythema in the upper inner portion of her breast.  Given Biafine to use BID-TID.    BP 120/73 mmHg  Pulse 85  Temp(Src) 97.6 F (36.4 C)  Ht 5\' 4"  (1.626 m)  Wt 147 lb 1.6 oz (66.724 kg)  BMI 25.24 kg/m2

## 2014-09-18 NOTE — Addendum Note (Signed)
Encounter addended by: Benn Moulder, RN on: 09/18/2014  6:30 PM<BR>     Documentation filed: Orders, [employer], Inpatient [facility]

## 2014-09-23 ENCOUNTER — Other Ambulatory Visit: Payer: Self-pay

## 2014-10-03 ENCOUNTER — Telehealth: Payer: Self-pay | Admitting: Family Medicine

## 2014-10-03 NOTE — Telephone Encounter (Signed)
Pt has appt on 10/04/14 at 9 AM with Dr Glori Bickers.

## 2014-10-03 NOTE — Telephone Encounter (Signed)
I will see her then  

## 2014-10-03 NOTE — Telephone Encounter (Signed)
PLEASE NOTE: All timestamps contained within this report are represented as [place] Standard Time. CONFIDENTIALTY NOTICE: This fax transmission is intended only for the addressee. It contains information that is legally privileged, confidential or otherwise protected from use or disclosure. If you are not the intended recipient, you are strictly prohibited from reviewing, disclosing, copying using or disseminating any of this information or taking any action in reliance on or regarding this information. If you have received this fax in error, please notify us immediately by telephone so that we can arrange for its return to [place]. Phone: [phone], Toll-Free: [phone], Fax: [phone] Page: 1 of 2 Call Id: [id number] [place] Patient Name: Sarah Young Gender: Female DOB: 1940-06-15 Age: 74 Y 5 M 3 D Return Phone Number: [phone] (Primary) Address: City/State/Zip: [place] Client [place] - Client Client Site [place], [place] Contact Type Call Call Type Triage / Clinical Caller Name [place] Relationship To Patient Self Return Phone Number [phone] (Primary) Chief Complaint Tick Bite Initial Comment Caller states she has a tick bite. She had [facility] Spotted Fever last year, states she also has breast cancer. Red circle is getting bigger around the bite, on her right leg. PreDisposition Call Doctor Nurse Assessment Nurse: Mechele Dawley, RN, Amy Date/Time Eilene Ghazi Time): 10/03/2014 9:59:38 AM Confirm and document reason for call. If symptomatic, describe symptoms. ---TICK BITE ON THE BACK OF HER KNEE ON THE RIGHT LEG. SHE HAD THE BITE ON JULY 5TH. TINY DEER TICK. STILL ALIVE WHEN SHE REMOVED IT. THE RED CIRCLE IS BIGGER TODAY THAN IT WAS YESTERDAY. NO SYMPTOMS. SHE IS UNDERGOING BREAST CA TREATMENTS. SHE IS IS  SUPPOSE TO START CHEMO TREATMENTS JULY 14TH. SHE NEEDS TO MAKE SURE SHE DOES NOT GET SICK PRIOR TO THIS. SHE HAD [place] SPOTTED FEVER LAST YEAR AND IT WAS THE SAME TICK. Has the patient traveled out of the country within the last 30 days? ---Not Applicable Does the patient require triage? ---Yes Related visit to physician within the last 2 weeks? ---No Does the PT have any chronic conditions? (i.e. diabetes, asthma, etc.) ---Yes List chronic conditions. ---BREAST CA Guidelines Guideline Title Affirmed Question Affirmed Notes Nurse Date/Time (Eastern Time) Tick Bite Red ring or bull's-eye rash occurs around a deer tick bite Nambe, RN, Amy 10/03/2014 10:00:51 AM Disp. Time Eilene Ghazi Time) Disposition Final User 10/03/2014 10:02:00 AM See Physician within 24 Hours Yes [place], Therapist, sports, Amy

## 2014-10-04 ENCOUNTER — Encounter: Payer: Self-pay | Admitting: Family Medicine

## 2014-10-04 ENCOUNTER — Ambulatory Visit (INDEPENDENT_AMBULATORY_CARE_PROVIDER_SITE_OTHER): Payer: Medicare Other | Admitting: Family Medicine

## 2014-10-04 VITALS — BP 124/68 | HR 74 | Temp 98.3°F | Ht 64.0 in | Wt 149.2 lb

## 2014-10-04 DIAGNOSIS — T148 Other injury of unspecified body region: Secondary | ICD-10-CM

## 2014-10-04 DIAGNOSIS — W57XXXA Bitten or stung by nonvenomous insect and other nonvenomous arthropods, initial encounter: Secondary | ICD-10-CM

## 2014-10-04 MED ORDER — DOXYCYCLINE HYCLATE 100 MG PO TABS: 100.0000 mg | ORAL_TABLET | Freq: Two times a day (BID) | ORAL | Status: DC

## 2014-10-04 NOTE — Progress Notes (Signed)
Pre visit review using our clinic review tool, if applicable. No additional management support is needed unless otherwise documented below in the visit note. 

## 2014-10-04 NOTE — Patient Instructions (Signed)
Keep the tick bite clean with soap and water Over the counter cortisone cream is ok also  Watch for a target/bullseye pattern to the bite - keep me posted  If any fever or other symptoms - fill px for doxycycline and let me know

## 2014-10-04 NOTE — Progress Notes (Signed)
Subjective:    Patient ID: Sarah Young, female    DOB: 09/28/1940, 74 y.o.   MRN: [id number]  HPI Here with a tick bite  Very tiny tick (thinks it was a deer tick) Noticed bite on Tuesday -itching on back of R thigh  [place]   Red area around the bite  A little knot  Cleaned it - put "itch cream" on it   No target/bullseye shape  No other rash  No fever     Of note- pos IGg for RMSF in the past   In midst of tx for breast cancer -will start tamoxifen Choose not to do chemo  Had 24 tx of radiation treatments   Patient Active Problem List   Diagnosis Date Noted  . Family history of breast cancer in female 05/17/2014  . Breast cancer of upper-outer quadrant of right female breast 05/08/2014  . GERD (gastroesophageal reflux disease) 03/01/2014  . Lumbar degenerative disc disease 08/15/2013  . Chronic pain of scapula 08/15/2013  . Tick bite of thigh 10/18/2012  . Other malaise and fatigue 10/18/2012  . Shingles 11/02/2011  . BPPV (benign paroxysmal positional vertigo) 09/16/2011  . Cerumen impaction 09/16/2011  . CONTACT DERMATITIS 02/10/2010  . Gastritis and gastroduodenitis 11/24/2009  . Essential hypertension 10/02/2008  . Migraine 09/06/2008  . VITAMIN D DEFICIENCY 06/27/2007  . HYPERLIPIDEMIA 05/11/2007  . ANXIETY 05/11/2007  . Disorder of bone and cartilage 05/11/2007  . POLIOMYELITIS 05/08/2007  . OSTEOARTHRITIS, HANDS, BILATERAL 05/08/2007  . FIBROMYALGIA 05/08/2007  . URINARY INCONTINENCE, STRESS, MILD 05/08/2007   Past Medical History  Diagnosis Date  . Osteopenia   . Anxiety   . Hyperlipidemia   . Vitamin D deficiency   . Fibromyalgia   . Arthritis     OA of hands  . Migraine   . DVT (deep venous thrombosis) 1980s  . Irritable bowel syndrome   . Fibromyalgia   . Wears glasses   . Breast cancer of upper-outer quadrant of right female breast 05/08/2014  . S/P radiation therapy 07/11/2014  through 08/12/2014     Right breast 4250 cGy in 17 sessions, right breast boost 1200 cGy in 6 sessions    Past Surgical History  Procedure Laterality Date  . Abdominal hysterectomy  1972  . Ovarian cyst removed  1985  . Cholecystectomy  2005  . Cataract extraction      both  . Appendectomy    . Colonoscopy    . Breast surgery  1995    lumpectomy fibrocystic breast-lt  . Radioactive seed guided mastectomy with axillary sentinel lymph node biopsy Right 05/30/2014    Procedure: RADIOACTIVE SEED GUIDED PARTIAL MASTECTOMY WITH AXILLARY SENTINEL LYMPH NODE BIOPSY;  Surgeon: Autumn Messing III, MD;  Location: [facility];  Service: General;  Laterality: Right;   History  Substance Use Topics  . Smoking status: Never Smoker   . Smokeless tobacco: Not on file  . Alcohol Use: No   Family History  Problem Relation Age of Onset  . Hypertension Father   . Cancer Father 32    esophageal - smoker  . Cancer Brother 69    breast CA  . Leukemia Mother   . Cancer Mother 35    leukemia  . Cancer Maternal Aunt 80    leukemia  . Cancer Paternal Aunt 34    breast   Allergies  Allergen Reactions  . Amoxicillin Itching  REACTION: reacts opposite effect made infection worse.  Marland Kitchen Amoxicillin-Pot Clavulanate     REACTION: Felt like body turned inside out  . Esomeprazole Magnesium     REACTION: Nausea  . Omeprazole Nausea And Vomiting    REACTION: not effective  . Ranitidine Hcl     REACTION: not effective   Current Outpatient Prescriptions on File Prior to Visit  Medication Sig Dispense Refill  . amitriptyline (ELAVIL) 10 MG tablet TAKE 1 TABLET BY MOUTH AT BEDTIME, MAY INCREASE WEEKLY AS TOLERATED TO A MAX OF5 TABLETS (50MG ) PER NIGHT 90 tablet 3  . calcium carbonate 200 MG capsule Take 250 mg by mouth 2 (two) times daily.    . Cholecalciferol (VITAMIN D PO) Take 1 tablet by mouth 2 (two) times daily.    .  fish oil-omega-3 fatty acids 1000 MG capsule Take 1 g by mouth 2 (two) times daily.    . fluticasone (FLONASE) 50 MCG/ACT nasal spray USE 1 SPRAY IN EACH NOSTRIL EVERY DAY ASDIRECTED 16 g 2  . SUMAtriptan (IMITREX) 100 MG tablet TAKE ONE TABLET AS NEEDED FOR MIGRAINE. MAY REPEAT IN TWO HOURS IF NEEDED *MAX OF 2 TABLETS IN 24 HOURS* 10 tablet 12   Current Facility-Administered Medications on File Prior to Visit  Medication Dose Route Frequency Provider Last Rate Last Dose  . chlorhexidine (HIBICLENS) 4 % liquid 1 application  1 application Topical Once Autumn Messing III, MD      . chlorhexidine (HIBICLENS) 4 % liquid 1 application  1 application Topical Once Autumn Messing III, MD         Review of Systems Review of Systems  Constitutional: Negative for fever, appetite change, fatigue and unexpected weight change.  Eyes: Negative for pain and visual disturbance.  Respiratory: Negative for cough and shortness of breath.   Cardiovascular: Negative for cp or palpitations    Gastrointestinal: Negative for nausea, diarrhea and constipation.  Genitourinary: Negative for urgency and frequency.  Skin: Negative for pallor or rash   Neurological: Negative for weakness, light-headedness, numbness and headaches.  Hematological: Negative for adenopathy. Does not bruise/bleed easily.  Psychiatric/Behavioral: Negative for dysphoric mood. The patient is not nervous/anxious.         Objective:   Physical Exam  Constitutional: She appears well-developed and well-nourished. No distress.  HENT:  Head: Normocephalic and atraumatic.  Eyes: Conjunctivae and EOM are normal. Pupils are equal, round, and reactive to light. No scleral icterus.  Neck: Normal range of motion. Neck supple.  Cardiovascular: Normal rate and regular rhythm.   Pulmonary/Chest: Effort normal and breath sounds normal.  Lymphadenopathy:    She has no cervical adenopathy.  Neurological: She is alert.  Skin: Skin is warm and dry. No rash noted.  No pallor.  Small 2-3 mm insect bite on back of R thigh with small scab , no insect parts remain No bullseye pattern   Psychiatric: She has a normal mood and affect.          Assessment & Plan:   Problem List Items Addressed This Visit    Tick bite - Primary    Small area of induration and scab on post R thigh  No bullseye pattern  No rash or other findings Will keep clean and tx with otc cortisone cream  If other symptoms develop - rash/fever/ bullseye pattern etc- will fill px for doxycycline and update

## 2014-10-06 NOTE — Assessment & Plan Note (Signed)
Small area of induration and scab on post R thigh  No bullseye pattern  No rash or other findings Will keep clean and tx with otc cortisone cream  If other symptoms develop - rash/fever/ bullseye pattern etc- will fill px for doxycycline and update

## 2014-11-11 ENCOUNTER — Telehealth: Payer: Self-pay | Admitting: Adult Health

## 2014-11-11 NOTE — Telephone Encounter (Signed)
I spoke with Ms. Utley to schedule her [facility]orship [facility] appt to see Chestine Spore, NP.  I gave her instructions on where to check in for this appt.  We look forward to participating in her care.   Mike Craze, NP [facility] [phone]

## 2014-11-28 ENCOUNTER — Ambulatory Visit (HOSPITAL_BASED_OUTPATIENT_CLINIC_OR_DEPARTMENT_OTHER): Payer: Medicare Other | Admitting: Nurse Practitioner

## 2014-11-28 ENCOUNTER — Encounter: Payer: Self-pay | Admitting: Nurse Practitioner

## 2014-11-28 VITALS — BP 138/48 | HR 74 | Temp 97.5°F | Resp 18 | Ht 64.0 in | Wt 149.4 lb

## 2014-11-28 DIAGNOSIS — C50411 Malignant neoplasm of upper-outer quadrant of right female breast: Secondary | ICD-10-CM

## 2014-11-28 NOTE — Progress Notes (Signed)
CLINIC:  Cancer Survivorship   REASON FOR VISIT:  Routine follow-up post-treatment for a recent history of breast cancer.  BRIEF ONCOLOGIC HISTORY:    Breast cancer of upper-outer quadrant of right female breast   04/17/2014 Mammogram Right breast: possible mass requiring additional imaging   05/03/2014 Breast US Right breast: Irregular, hypoechoic mass with partly ill-defined margins at 12:00 position, 14 cm from the nipple, measuring 8 x 9 x 9 mm. Ultrasound of the right axilla shows no enlarged or abnormal lymph nodes.   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      05/20/2014 Breast MRI 1.6 cm mass in the upper central portion of the right breast associated with clip artifact consistent with known malignancy. No lymphadenopathy.   05/20/2014 Clinical Stage Stage IA: T1b N0   05/30/2014 Definitive Surgery Right lumpectomy with SLNB [name]): Invasive ductal carcinoma, grade 1, 1 sentinel axillary lymph node sampled and negative for malignancy. HER2/neu repeated, negative (ratio 1.79).   05/30/2014 Oncotype testing Score: 25 (16% ROR). No chemotherapy per pt.   05/30/2014 Pathologic Stage Stage IA: pT1c pN0   06/04/2014 Procedure Genetic Testing: BreastNext Panel performed revealing no clinically significant variant in ATM, BARD1, BRCA1, BRCA2, BRIP1, CDH1, CHEK2, MRE11A, MUTYH, NBN, NF1, PALB2, PTEN, RAD50, RAD51C, RAD51D, and TP53.   07/11/2014 - 08/12/2014 Radiation Therapy Adjuvant RT completed Valere Dross): Right breast 42.50 Gy over 17 sessions, right breast boost 12 Gy over 6 sessions. Total dose received: 54.5 Gy.   10/10/2014 -  Anti-estrogen oral therapy Tamoxifen 20 mg daily began (Magrinat).  Planned duration of therapy 5-10 years.   11/28/2014 Survivorship Survivorship visit completed and copy of survivorship care plan provided to patient / sent to PCP.    INTERVAL HISTORY:  Ms.  Young presents to the [facility] today for our initial meeting to review her survivorship care plan detailing her treatment course for breast cancer, as well as monitoring long-term side effects of that treatment, education regarding health maintenance, screening, and overall wellness and health promotion.     Overall, [name] reports feeling quite well since completing her radiation therapy approximately three months ago. She reports the development of some slight thickening along her right breast at the area of her lumpectomy scar that she has noticed over the last month.  It is slightly tender to palpation, but otherwise, does not cause her pain. She reports she had a "crash" of fatigue that hit her in the beginning of July, which is improving. 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 She reports a good appetite and denies weight loss, shortness of breath, or cough. Ms. Camberos began her Tamoxifen on 10/10/2014 and is tolerating it well.  She denies any hot flashes, but states that her fingers and toes are cold all the time, without any pain or tingling.   REVIEW OF SYSTEMS:  General: Denies fever or chills. Reports that her fatigue is improving. Cardiac: Denies palpitations, chest pain, and lower extremity edema.  Respiratory: Denies dyspnea on exertion.  GI: Denies abdominal pain, constipation, diarrhea, nausea, or vomiting.  GU: Denies dysuria, hematuria, vaginal bleeding, vaginal discharge, or vaginal dryness. She does report some mild urinary incontinence, which she plans to discuss with Dr. Glori Bickers. Musculoskeletal: Continues with joint and bone pain, which is unchanged.   Neuro: Denies headache or recent falls.  Denies peripheral neuropathy. Skin: Denies rash, pruritis, or open wounds.  Breast: Denies any nipple changes or nipple discharge.  Skin thickening along right lumpectomy scar, as above.  Otherwise, no mass or  noduarity in either breast. Psych: Occasional insomnia.  Denies depression, anxiety or memory loss.   A 14-point review of systems was completed and was negative, except as noted above.   ONCOLOGY TREATMENT TEAM:  1. Surgeon:  Dr. [name]   2. Medical Oncologist: [name] 3. Radiation Oncologist: Dr. Valere Dross    PAST MEDICAL/SURGICAL HISTORY:  Past Medical History  Diagnosis Date  . Osteopenia   . Anxiety   . Hyperlipidemia   . Vitamin D deficiency   . Fibromyalgia   . Arthritis     OA of hands  . Migraine   . DVT (deep venous thrombosis) 1980s  . Irritable bowel syndrome   . Fibromyalgia   . Wears glasses   . Breast cancer of upper-outer quadrant of right female breast 05/08/2014  . S/P radiation therapy 07/11/2014 through 08/12/2014     Right breast 4250 cGy in 17 sessions, right breast boost 1200 cGy in 6 sessions    Past Surgical History  Procedure Laterality Date  . Abdominal hysterectomy  1972  . Ovarian cyst removed  1985  . Cholecystectomy  2005  . Cataract extraction      both  . Appendectomy    . Colonoscopy    . Breast surgery  1995    lumpectomy fibrocystic breast-lt  . Radioactive seed guided mastectomy with axillary sentinel lymph node biopsy Right 05/30/2014    Procedure: RADIOACTIVE SEED GUIDED PARTIAL MASTECTOMY WITH AXILLARY SENTINEL LYMPH NODE BIOPSY;  Surgeon: Autumn Messing III, MD;  Location: [facility];  Service: General;  Laterality: Right;     ALLERGIES:  Allergies  Allergen Reactions  . Amoxicillin Itching    REACTION: reacts opposite effect made infection worse.  Marland Kitchen Amoxicillin-Pot Clavulanate     REACTION: Felt like body turned inside out  . Esomeprazole Magnesium     REACTION: Nausea  . Omeprazole Nausea And Vomiting    REACTION: not effective  . Ranitidine Hcl     REACTION: not effective     CURRENT MEDICATIONS:  Current Outpatient Prescriptions  on File Prior to Visit  Medication Sig Dispense Refill  . amitriptyline (ELAVIL) 10 MG tablet TAKE 1 TABLET BY MOUTH AT BEDTIME, MAY INCREASE WEEKLY AS TOLERATED TO A MAX OF5 TABLETS (50MG) PER NIGHT 90 tablet 3  . calcium carbonate 200 MG capsule Take 250 mg by mouth 2 (two) times daily.    . Cholecalciferol (VITAMIN D PO) Take 1 tablet by mouth 2 (two) times daily.    Marland Kitchen doxycycline (VIBRA-TABS) 100 MG tablet Take 1 tablet (100 mg total) by mouth 2 (two) times daily. 20 tablet 0  . fish oil-omega-3 fatty acids 1000 MG capsule Take 1 g by mouth 2 (two) times daily.    . fluticasone (FLONASE) 50 MCG/ACT nasal spray USE 1 SPRAY IN EACH NOSTRIL EVERY DAY ASDIRECTED 16 g 2  . SUMAtriptan (IMITREX) 100 MG tablet TAKE ONE TABLET AS NEEDED FOR MIGRAINE. MAY REPEAT IN TWO HOURS IF NEEDED *MAX OF 2 TABLETS IN 24 HOURS* 10 tablet 12   Current Facility-Administered Medications on File Prior to Visit  Medication Dose Route Frequency Provider Last Rate Last Dose  . chlorhexidine (HIBICLENS) 4 % liquid 1 application  1 application Topical Once Autumn Messing III, MD      . chlorhexidine (  HIBICLENS) 4 % liquid 1 application  1 application Topical Once Autumn Messing III, MD         ONCOLOGIC FAMILY HISTORY:  Family History  Problem Relation Age of Onset  . Hypertension Father   . Cancer Father 72    esophageal - smoker  . Cancer Brother 74    breast CA  . Leukemia Mother   . Cancer Mother 56    leukemia  . Cancer Maternal Aunt 80    leukemia  . Cancer Paternal Aunt 36    breast     GENETIC COUNSELING/TESTING: Yes, performed 06/04/2014. BreastNext Panel performed revealing no clinically significant variant in ATM, BARD1, BRCA1, BRCA2, BRIP1, CDH1, CHEK2, MRE11A, MUTYH, NBN, NF1, PALB2, PTEN, RAD50, RAD51C, RAD51D, and TP53.  SOCIAL HISTORY:  Sarah Young is married and lives with her spouse in [place], [place].  She has 2 daughters.  Ms. 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  She denies any current or history of tobacco, alcohol, or illicit drug use.     PHYSICAL EXAMINATION:  Vital Signs:   Filed Vitals:   11/28/14 0935  BP: 138/48  Pulse: 74  Temp: 97.5 F (36.4 C)  Resp: 18   General: Well-nourished, well-appearing female in no acute distress.  She is unaccompanied in clinic today.   HEENT: Head is atraumatic and normocephalic.  Pupils equal and reactive to light and accomodation. Conjunctivae clear without exudate.  Sclerae anicteric. Oral mucosa is pink, moist, and intact without lesions.  Oropharynx is pink without lesions or erythema.  Lymph: No cervical, supraclavicular, infraclavicular, or axillary lymphadenopathy noted on palpation.  Cardiovascular: Regular rate and rhythm without murmurs, rubs, or gallops. Respiratory: Clear to auscultation bilaterally. Chest expansion symmetric without accessory muscle use on inspiration or expiration.  Breast: Lumpectomy scar at right breast without nodularity or erythema.  Slight band of smooth thickening under skin at right breast under scar at site of radiation. Tender to palpation.  No nodularity. No mass or nodule within remainder of breast. GI: Abdomen soft and round. No tenderness to palpation. Bowel sounds normoactive in 4 quadrants. No hepatosplenomegaly.   GU: Deferred.  Musculoskeletal:  Full ROM noted in all extremities.  Neuro: No focal deficits. Steady gait.  Psych: Mood and affect normal and appropriate for situation.  Extremities: No edema, cyanosis, or clubbing.  Skin: Warm and dry. No open lesions noted.   LABORATORY DATA:  None for this visit.  DIAGNOSTIC IMAGING:  None for this visit.     ASSESSMENT AND PLAN:   1. History of breast cancer: Stage IA invasive ductal carcinoma, ER+, PR-, HER2/neu negative, S/P lumpectomy and radiation therapy as outlined above, now on tamoxifen as adjuvant endocrine therapy, tolerating well without toxicity.  Based on  the physical findings and timing since completion of treatment, I believe that the thickening Ms. Kerwood is demonstrating is consistent with post-radiation changes.  I have advised her to continue to monitor this and report any change. We could evaluate this with diagnostic ultrasound.   Ms. Dolliver will follow-up with her medical oncologist,  [name] in October 2016 with history and physical exam per surveillance protocol.  She will continue her anti-estrogen therapy with tamoxifen and report if she begins to experience any side effects of the medication. I could see her back in clinic to help manage those side effects, as needed. She is S/P hysterectomy. A comprehensive survivorship care plan and treatment summary was  reviewed with the patient today detailing her breast cancer diagnosis, treatment course, potential late/long-term effects of treatment, appropriate follow-up care with recommendations for the future, and patient education resources.  A copy of this summary, along with a letter will be sent to the patient's primary care provider via in basket message after today's visit.  Ms. Labombard is welcome to return to the Survivorship Clinic in the future, as needed; no follow-up will be scheduled at this time.    2. Cancer screening:  Due to Ms. Dorantes's history and her age, she should receive screening for skin cancers and colon cancer. She is up to date on her colonoscopy at this time. The information and recommendations are listed on the patient's comprehensive care plan/treatment summary and were reviewed in detail with the patient.    3. Health maintenance and wellness promotion: Ms. Kauzlarich was encouraged to consume 5-7 servings of fruits and vegetables per day. We reviewed the "Nutrition Rainbow" handout, as well as the handout about "Nutrition for Breast Cancer Survivors."  She was also encouraged to engage in moderate to vigorous exercise for 30 minutes per day most days of the week. We discussed the  [employer] fitness program, which is designed for cancer survivors to help them become more physically fit after cancer treatments.  She was instructed to limit her alcohol consumption and continue to abstain from tobacco use.    4. Support services/counseling: It is not uncommon for this period of the patient's cancer care trajectory to be one of many emotions and stressors.  We discussed an opportunity for her to participate in the next session of [facility] ("Finding Your New Normal") support group series designed for patients after they have completed treatment.   Ms. Ritacco was encouraged to take advantage of our many other support services programs, support groups, and/or counseling in coping with her new life as a cancer survivor after completing anti-cancer treatment.  She was offered support today through active listening and expressive supportive counseling.  She was given information regarding our available services and encouraged to contact me with any questions or for help enrolling in any of our support group/programs.    A total of 50 minutes of face-to-face time was spent with this patient with greater than 50% of that time in counseling and care-coordination.   Sylvan Cheese, NP  Survivorship Program [facility] [phone]   Note: PRIMARY CARE PROVIDER [place] [phone]

## 2015-01-01 ENCOUNTER — Other Ambulatory Visit (HOSPITAL_BASED_OUTPATIENT_CLINIC_OR_DEPARTMENT_OTHER): Payer: Medicare Other

## 2015-01-01 DIAGNOSIS — C50411 Malignant neoplasm of upper-outer quadrant of right female breast: Secondary | ICD-10-CM

## 2015-01-01 DIAGNOSIS — M949 Disorder of cartilage, unspecified: Secondary | ICD-10-CM

## 2015-01-01 DIAGNOSIS — M899 Disorder of bone, unspecified: Secondary | ICD-10-CM

## 2015-01-01 DIAGNOSIS — I10 Essential (primary) hypertension: Secondary | ICD-10-CM

## 2015-01-01 LAB — COMPREHENSIVE METABOLIC PANEL (CC13)
ALT: 16 U/L (ref 0–55)
AST: 22 U/L (ref 5–34)
Albumin: 3.3 g/dL — ABNORMAL LOW (ref 3.5–5.0)
Alkaline Phosphatase: 40 U/L (ref 40–150)
Anion Gap: 5 mEq/L (ref 3–11)
BUN: 20.7 mg/dL (ref 7.0–26.0)
CHLORIDE: 108 meq/L (ref 98–109)
CO2: 29 mEq/L (ref 22–29)
Calcium: 8.9 mg/dL (ref 8.4–10.4)
Creatinine: 1.2 mg/dL — ABNORMAL HIGH (ref 0.6–1.1)
EGFR: 46 mL/min/{1.73_m2} — ABNORMAL LOW (ref >90)
Glucose: 91 mg/dl (ref 70–140)
POTASSIUM: 4.7 meq/L (ref 3.5–5.1)
SODIUM: 142 meq/L (ref 136–145)
Total Bilirubin: 0.45 mg/dL (ref 0.20–1.20)
Total Protein: 6.2 g/dL — ABNORMAL LOW (ref 6.4–8.3)

## 2015-01-01 LAB — CBC WITH DIFFERENTIAL/PLATELET
BASO%: 1.2 % (ref 0.0–2.0)
Basophils Absolute: 0.1 10*3/uL (ref 0.0–0.1)
EOS%: 1.5 % (ref 0.0–7.0)
Eosinophils Absolute: 0.1 10*3/uL (ref 0.0–0.5)
HCT: 39.5 % (ref 34.8–46.6)
HGB: 13.1 g/dL (ref 11.6–15.9)
LYMPH%: 22.6 % (ref 14.0–49.7)
MCH: 30.1 pg (ref 25.1–34.0)
MCHC: 33 g/dL (ref 31.5–36.0)
MCV: 91.2 fL (ref 79.5–101.0)
MONO#: 0.4 10*3/uL (ref 0.1–0.9)
MONO%: 8.6 % (ref 0.0–14.0)
NEUT#: 3.1 10*3/uL (ref 1.5–6.5)
NEUT%: 66.1 % (ref 38.4–76.8)
Platelets: 190 10*3/uL (ref 145–400)
RBC: 4.33 10*6/uL (ref 3.70–5.45)
RDW: 14.7 % — AB (ref 11.2–14.5)
WBC: 4.6 10*3/uL (ref 3.9–10.3)
lymph#: 1.1 10*3/uL (ref 0.9–3.3)

## 2015-01-08 ENCOUNTER — Telehealth: Payer: Self-pay | Admitting: Oncology

## 2015-01-08 ENCOUNTER — Other Ambulatory Visit: Payer: Self-pay | Admitting: Oncology

## 2015-01-08 ENCOUNTER — Ambulatory Visit (HOSPITAL_BASED_OUTPATIENT_CLINIC_OR_DEPARTMENT_OTHER): Payer: Medicare Other | Admitting: Oncology

## 2015-01-08 VITALS — BP 144/51 | HR 74 | Temp 98.1°F | Resp 18 | Ht 64.0 in | Wt 151.1 lb

## 2015-01-08 DIAGNOSIS — Z170 Estrogen receptor positive status [ER+]: Secondary | ICD-10-CM

## 2015-01-08 DIAGNOSIS — Z79811 Long term (current) use of aromatase inhibitors: Secondary | ICD-10-CM

## 2015-01-08 DIAGNOSIS — C50411 Malignant neoplasm of upper-outer quadrant of right female breast: Secondary | ICD-10-CM | POA: Diagnosis not present

## 2015-01-08 MED ORDER — GABAPENTIN 100 MG PO CAPS: 100.0000 mg | ORAL_CAPSULE | Freq: Every day | ORAL | Status: DC

## 2015-01-08 NOTE — Progress Notes (Signed)
[place]  Telephone:(336) [phone] Fax:(336) [phone]     ID: [name] DOB: 1940-08-06  MR#: [id number]  WEX#:[id number]  Patient Care Team: Abner Greenspan, MD as PCP - [place] III, MD as Consulting Physician (General Surgery) Chauncey Cruel, MD as Consulting Physician (Oncology) Arloa Koh, MD as Consulting Physician (Radiation Oncology) Rockwell Germany, RN as Registered Nurse Mauro Kaufmann, RN as Registered Nurse Holley Bouche, NP as Nurse Practitioner (Nurse Practitioner) Sylvan Cheese, NP as Nurse Practitioner (Nurse Practitioner) PCP: Loura Pardon, MD OTHER MD: Nena Polio M.D.  CHIEF COMPLAINT: Estrogen receptor positive breast cancer  CURRENT TREATMENT: tamoxifen   BREAST CANCER HISTORY: From the original intake note:  Sheralee had routine bilateral screening mamm[facility]. This found the breast density to be category C. A possible mass was noted in the right breast, and on 05/03/2014 she underwent right diagnostic mammography with ultrasonography. There was a spiculated mass in the upper outer quadrant of the right breast which was new since the prior mammogram. On physical exam there was mild nodularity noted in the area in question. Ultrasound confirmed a hypoechoic irregular mass measuring 0.9 cm in the upper outer quadrant.  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  MRI of the breasts is scheduled for 05/20/2014. The patient's subsequent history is as detailed below  INTERVAL HISTORY: Rechy returns today for follow-up of her breast cancer. She started tamoxifen on Bastille day. She is tolerating  it generally well. She is having some nighttime hot flashes which to wake her up. Hot flashes during the day are not a major issue. She is having minimal vaginal wetness which she can clearly distinguish from an infection or discharge. She's also having some nighttime cramps, which are not new but which are little bit more frequent than before  REVIEW OF SYSTEMS: She has some sinus symptoms related to the recent change in weather. She has chronic reflux issues. Of course she has significant osteoarthritis. None of this is new, or more persistent or intense than before. A detailed review of systems today was otherwise noncontributory  PAST MEDICAL HISTORY: Past Medical History  Diagnosis Date  . Osteopenia   . Anxiety   . Hyperlipidemia   . Vitamin D deficiency   . Fibromyalgia   . Arthritis     OA of hands  . Migraine   . DVT (deep venous thrombosis) 1980s  . Irritable bowel syndrome   . Fibromyalgia   . Wears glasses   . Breast cancer of upper-outer quadrant of right female breast 05/08/2014  . S/P radiation therapy 07/11/2014 through 08/12/2014     Right breast 4250 cGy in 17 sessions, right breast boost 1200 cGy in 6 sessions     PAST SURGICAL HISTORY: Past Surgical History  Procedure Laterality Date  . Abdominal hysterectomy  1972  . Ovarian cyst removed  1985  . Cholecystectomy  2005  . Cataract extraction      both  . Appendectomy    . Colonoscopy    . Breast surgery  1995    lumpectomy fibrocystic breast-lt  . Radioactive seed guided mastectomy with axillary sentinel lymph node biopsy Right 05/30/2014  Procedure: RADIOACTIVE SEED GUIDED PARTIAL MASTECTOMY WITH AXILLARY SENTINEL LYMPH NODE BIOPSY;  Surgeon: Autumn Messing III, MD;  Location: [facility];  Service: General;  Laterality: Right;    FAMILY HISTORY Family History  Problem Relation Age of Onset  . Hypertension Father   .  Cancer Father 79    esophageal - smoker  . Cancer Brother 72    breast CA  . Leukemia Mother   . Cancer Mother 73    leukemia  . Cancer Maternal Aunt 80    leukemia  . Cancer Paternal Aunt 83    breast   the patient's father died at the age of 90 with gastric cancer. The patient's mother was diagnosed with chronic leukemia at age 66 and died from complications of dementia at age 105 the patient has one brother, diagnosed with breast cancer at the age of 21. She has 2 sisters. There is no other history of breast or ovarian cancer in the family.  GYNECOLOGIC HISTORY:  No LMP recorded. Patient has had a hysterectomy. Menarche age 74, first live birth age 74. The patient is GX P2. She underwent total abdominal hysterectomy with bilateral salpingo-oophorectomy 1972. She used hormone replacement for approximately 38 years, until 2011.  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 Her husband Jori Moll is currently not active in the business. Daughter [name], lives in [place], his Pres. The other daughter, [name], lives in [place] where she works as a Landscape architect. The patient has 3 grandchildren. She attends a local [employer] DIRECTIVES: The patient has named both her husband Jori Moll and her daughter Suanne Marker as Equities trader powers of attorney.   HEALTH MAINTENANCE: Social History  Substance Use Topics  . Smoking status: Never Smoker   . Smokeless tobacco: Not on file     Comment: non smoker  . Alcohol Use: No     Colonoscopy:  PAP:  Bone density:  Lipid panel:  Allergies  Allergen Reactions  . Amoxicillin Itching    REACTION: reacts opposite effect made infection worse.  Marland Kitchen Amoxicillin-Pot Clavulanate     REACTION: Felt like body turned inside out  . Esomeprazole Magnesium     REACTION: Nausea  . Omeprazole Nausea And Vomiting    REACTION: not effective  . Ranitidine  Hcl     REACTION: not effective    Current Outpatient Prescriptions  Medication Sig Dispense Refill  . amitriptyline (ELAVIL) 10 MG tablet TAKE 1 TABLET BY MOUTH AT BEDTIME, MAY INCREASE WEEKLY AS TOLERATED TO A MAX OF5 TABLETS (50MG) PER NIGHT 90 tablet 3  . calcium carbonate 200 MG capsule Take 250 mg by mouth 2 (two) times daily.    . Cholecalciferol (VITAMIN D PO) Take 1 tablet by mouth 2 (two) times daily.    Marland Kitchen doxycycline (VIBRA-TABS) 100 MG tablet Take 1 tablet (100 mg total) by mouth 2 (two) times daily. 20 tablet 0  . fish oil-omega-3 fatty acids 1000 MG capsule Take 1 g by mouth 2 (two) times daily.    . fluticasone (FLONASE) 50 MCG/ACT nasal spray USE 1 SPRAY IN EACH NOSTRIL EVERY DAY ASDIRECTED 16 g 2  . SUMAtriptan (IMITREX) 100 MG tablet TAKE ONE TABLET AS NEEDED FOR MIGRAINE. MAY REPEAT IN TWO HOURS IF NEEDED *MAX OF 2 TABLETS IN 24 HOURS* 10 tablet 12   No current facility-administered medications for this visit.   Facility-Administered Medications Ordered  in Other Visits  Medication Dose Route Frequency Provider Last Rate Last Dose  . chlorhexidine (HIBICLENS) 4 % liquid 1 application  1 application Topical Once Autumn Messing III, MD      . chlorhexidine (HIBICLENS) 4 % liquid 1 application  1 application Topical Once Autumn Messing III, MD        OBJECTIVE: Older white woman in no acute distress Filed Vitals:   01/08/15 0924  BP: 144/51  Pulse: 74  Temp: 98.1 F (36.7 C)  Resp: 18     Body mass index is 25.92 kg/(m^2).    ECOG FS:0 - Asymptomatic  Sclerae unicteric, pupils round and equal Oropharynx clear and moist-- no thrush or other lesions No cervical or supraclavicular adenopathy Lungs no rales or rhonchi Heart regular rate and rhythm Abd soft, nontender, positive bowel sounds MSK no focal spinal tenderness, no upper extremity lymphedema, osteoarthritic changes involving both hands  Neuro: nonfocal, well oriented, appropriate affect Breasts: The right breast is  status post lumpectomy and radiation. There is no evidence of local recurrence. Just superior to the lumpectomy scar and towards the middle of the scar there is a 4 mm "bump" which is not tender or erythematous and which is going to be scar tissue. This requires only monitoring. The right axilla is benign. The left breast is unremarkable.       LAB RESULTS:  CMP     Component Value Date/Time   NA 142 01/01/2015 0925   NA 140 04/20/2013 1159   K 4.7 01/01/2015 0925   K 4.5 04/20/2013 1159   CL 103 04/20/2013 1159   CO2 29 01/01/2015 0925   CO2 32 04/20/2013 1159   GLUCOSE 91 01/01/2015 0925   GLUCOSE 88 04/20/2013 1159   BUN 20.7 01/01/2015 0925   BUN 20 04/20/2013 1159   CREATININE 1.2* 01/01/2015 0925   CREATININE 1.2 04/20/2013 1159   CALCIUM 8.9 01/01/2015 0925   CALCIUM 9.8 04/20/2013 1159   PROT 6.2* 01/01/2015 0925   PROT 7.1 04/20/2013 1159   ALBUMIN 3.3* 01/01/2015 0925   ALBUMIN 3.8 04/20/2013 1159   AST 22 01/01/2015 0925   AST 23 04/20/2013 1159   ALT 16 01/01/2015 0925   ALT 15 04/20/2013 1159   ALKPHOS 40 01/01/2015 0925   ALKPHOS 61 04/20/2013 1159   BILITOT 0.45 01/01/2015 0925   BILITOT 0.7 04/20/2013 1159   GFRNONAA 50.17 10/07/2009 0952   GFRAA 58 05/11/2007 1023    INo results found for: SPEP, UPEP  Lab Results  Component Value Date   WBC 4.6 01/01/2015   NEUTROABS 3.1 01/01/2015   HGB 13.1 01/01/2015   HCT 39.5 01/01/2015   MCV 91.2 01/01/2015   PLT 190 01/01/2015      Chemistry      Component Value Date/Time   NA 142 01/01/2015 0925   NA 140 04/20/2013 1159   K 4.7 01/01/2015 0925   K 4.5 04/20/2013 1159   CL 103 04/20/2013 1159   CO2 29 01/01/2015 0925   CO2 32 04/20/2013 1159   BUN 20.7 01/01/2015 0925   BUN 20 04/20/2013 1159   CREATININE 1.2* 01/01/2015 0925   CREATININE 1.2 04/20/2013 1159      Component Value Date/Time   CALCIUM 8.9 01/01/2015 0925   CALCIUM 9.8 04/20/2013 1159   ALKPHOS 40 01/01/2015 0925   ALKPHOS  61 04/20/2013 1159   AST 22 01/01/2015 0925   AST 23 04/20/2013 1159   ALT 16 01/01/2015 0925   ALT  15 04/20/2013 1159   BILITOT 0.45 01/01/2015 0925   BILITOT 0.7 04/20/2013 1159       No results found for: LABCA2  No components found for: LABCA125  No results for input(s): INR in the last 168 hours.  Urinalysis No results found for: COLORURINE, APPEARANCEUR, LABSPEC, PHURINE, GLUCOSEU, HGBUR, BILIRUBINUR, KETONESUR, PROTEINUR, UROBILINOGEN, NITRITE, LEUKOCYTESUR  STUDIES: No results found.  ASSESSMENT: 74 y.o. BRCA negative [place] woman status post right breast biopsy 05/03/2014 for a clinical T1b N0, stage IA invasive ductal carcinoma, grade 1 or 2, estrogen receptor positive, progesterone receptor negative, with an MIB-1 of 12%, and HER-2/neu equivocal  (1) right lumpectomy and sentinel lymph node sampling 05/30/2014 showed a pT1c pN0, stage IA invasive ductal carcinoma, grade 1, repeat HER-2 again negative.  (2) genetics testing 05/17/2014 showed no deleterious in ATM, BARD1, BRCA1, BRCA2, BRIP1, CDH1, CHEK2, MRE11A, MUTYH, NBN, NF1, PALB2, PTEN, RAD50, RAD51C, RAD51D, and TP53. Cephus Shelling genetics, [facility] panel)  (3) Oncotype score of 25 predicts a risk of distant recurrence of 16% if the patient's only systemic treatment is tamoxifen for 5 years; it also predicts a benefit from chemotherapy in the 5% range. Given this marginal benefit the patient opted against adjuvant chemotherapy  (4) adjuvant radiation completed 08/12/2014: Right breast 4250 cGy in 17 sessions, right breast boost 1200 cGy in 6 sessions  (5) started tamoxifen 10/10/2014  (a) status post hysterectomy and bilateral salpingo-oophorectomy in 1972  PLAN: Kermit Balo is tolerating the tamoxifen well. I think we can help with the nighttime hot flashes and possibly the cramps as well. She is going to take gabapentin 100 mg at bedtime. She's also going to take a half a cup or a cup of tonic water before  going to bed. This third think she may want to do before going to bed is some stretching exercises.  She is a ready scheduled to see Dr. Glori Bickers in December. I'm suggesting she see Dr. [name] in late February. She then can return to see [place] in May. If she sees Dr. [name] again in August she can see [place] again in November of next year and we will continue to tag team her in that fashion until we start broadening the follow-up interval.  The overall plan is to continue tamoxifen for a minimum of 5 years with consideration of 10 depending on how she is doing 5 years from now  Manasa has a good understanding of this plan. She agrees with it. She knows to call for any problems that may develop before her next visit here.  Chauncey Cruel, MD   01/08/2015 9:37 AM Medical Oncology and Hematology [facility] [address] [place], [place] [address] Tel. [phone]    Fax. [phone]

## 2015-01-08 NOTE — Telephone Encounter (Signed)
Appointments made and avs printed for patient °

## 2015-01-24 ENCOUNTER — Other Ambulatory Visit: Payer: Self-pay | Admitting: Family Medicine

## 2015-03-04 ENCOUNTER — Encounter: Payer: Self-pay | Admitting: Family Medicine

## 2015-03-04 ENCOUNTER — Ambulatory Visit (INDEPENDENT_AMBULATORY_CARE_PROVIDER_SITE_OTHER): Payer: Medicare Other | Admitting: Family Medicine

## 2015-03-04 VITALS — BP 118/68 | HR 72 | Temp 98.4°F | Ht 64.0 in | Wt 149.0 lb

## 2015-03-04 DIAGNOSIS — I10 Essential (primary) hypertension: Secondary | ICD-10-CM

## 2015-03-04 DIAGNOSIS — Z23 Encounter for immunization: Secondary | ICD-10-CM

## 2015-03-04 DIAGNOSIS — M797 Fibromyalgia: Secondary | ICD-10-CM | POA: Diagnosis not present

## 2015-03-04 DIAGNOSIS — E782 Mixed hyperlipidemia: Secondary | ICD-10-CM

## 2015-03-04 LAB — LIPID PANEL
CHOL/HDL RATIO: 3
CHOLESTEROL: 209 mg/dL — AB (ref 0–200)
HDL: 71.6 mg/dL (ref >39.00)
LDL CALC: 125 mg/dL — AB (ref 0–99)
NONHDL: 137.79
Triglycerides: 63 mg/dL (ref 0.0–149.0)
VLDL: 12.6 mg/dL (ref 0.0–40.0)

## 2015-03-04 LAB — CBC WITH DIFFERENTIAL/PLATELET
BASOS PCT: 0.8 % (ref 0.0–3.0)
Basophils Absolute: 0 10*3/uL (ref 0.0–0.1)
EOS PCT: 0.8 % (ref 0.0–5.0)
Eosinophils Absolute: 0 10*3/uL (ref 0.0–0.7)
HEMATOCRIT: 40.5 % (ref 36.0–46.0)
HEMOGLOBIN: 13.4 g/dL (ref 12.0–15.0)
LYMPHS PCT: 22.4 % (ref 12.0–46.0)
Lymphs Abs: 1.2 10*3/uL (ref 0.7–4.0)
MCHC: 33 g/dL (ref 30.0–36.0)
MCV: 90.4 fl (ref 78.0–100.0)
MONOS PCT: 8.2 % (ref 3.0–12.0)
Monocytes Absolute: 0.4 10*3/uL (ref 0.1–1.0)
Neutro Abs: 3.7 10*3/uL (ref 1.4–7.7)
Neutrophils Relative %: 67.8 % (ref 43.0–77.0)
Platelets: 200 10*3/uL (ref 150.0–400.0)
RBC: 4.48 Mil/uL (ref 3.87–5.11)
RDW: 14.2 % (ref 11.5–15.5)
WBC: 5.5 10*3/uL (ref 4.0–10.5)

## 2015-03-04 LAB — COMPREHENSIVE METABOLIC PANEL
ALBUMIN: 3.6 g/dL (ref 3.5–5.2)
ALT: 14 U/L (ref 0–35)
AST: 23 U/L (ref 0–37)
Alkaline Phosphatase: 38 U/L — ABNORMAL LOW (ref 39–117)
BUN: 21 mg/dL (ref 6–23)
CALCIUM: 9.2 mg/dL (ref 8.4–10.5)
CHLORIDE: 104 meq/L (ref 96–112)
CO2: 30 mEq/L (ref 19–32)
CREATININE: 1.29 mg/dL — AB (ref 0.40–1.20)
GFR: 42.84 mL/min — AB (ref >60.00)
Glucose, Bld: 90 mg/dL (ref 70–99)
Potassium: 4.6 mEq/L (ref 3.5–5.1)
Sodium: 140 mEq/L (ref 135–145)
Total Bilirubin: 0.5 mg/dL (ref 0.2–1.2)
Total Protein: 6.6 g/dL (ref 6.0–8.3)

## 2015-03-04 LAB — TSH: TSH: 2.55 u[IU]/mL (ref 0.35–4.50)

## 2015-03-04 MED ORDER — SUMATRIPTAN SUCCINATE 100 MG PO TABS: ORAL_TABLET | ORAL | Status: DC

## 2015-03-04 NOTE — Patient Instructions (Signed)
If you want to take the elavil (amitriptilyne) with gabapentin- it is ok  Flu shot today  prevnar vaccine today  I recommend follow up with Dr Oletta Lamas for your GI problems since you cannot tolerate the medicines you have tried  Lab today

## 2015-03-04 NOTE — Progress Notes (Signed)
Subjective:    Patient ID: Sarah Young, female    DOB: 1940-11-22, 74 y.o.   MRN: [id number]  HPI  Here for f/u of chronic health problems   Doing ok overall    Wt is down 2 lb with bmi of 25 She is eating enough and taking care of herself   Has undergone tx for breast cancer since last feb  On tamoxifen now - tolerating that ok so far  oncol put her on gabapentin for cramps with it (esp her hands)   Still having heartburn  Cannot tolerate omeprazole or nexium or zantac  Has seen Dr Oletta Lamas  Takes tums    Fibromyalgia  Gabapentin-for this and tamoxifen side effects  Elavil-not taking since she started gabapentin - does feel like she needs it some nights  Cannot stay asleep at night  Active lifestyle but no exercise in addition to that -does not have time  She still works full time   Hands hurt- sees Dr Durel Salts shots in them  Is using braces - has carpal tunnel and arthritis in both hands - it helps  Also some shots in her neck   Hx of HTN -lifestyle controlled BP Readings from Last 3 Encounters:  03/04/15 118/68  01/08/15 144/51  11/28/14 138/48    Had her flu shot this season  Has had pneumovax  Will get prevnar today   Migraines were worse for a while They have leveled off  Needs refill of imitrex   Patient Active Problem List   Diagnosis Date Noted  . Tick bite 10/04/2014  . Family history of breast cancer in female 05/17/2014  . Breast cancer of upper-outer quadrant of right female breast ([place]) 05/08/2014  . GERD (gastroesophageal reflux disease) 03/01/2014  . Lumbar degenerative disc disease 08/15/2013  . Chronic pain of scapula 08/15/2013  . Tick bite of thigh 10/18/2012  . Other malaise and fatigue 10/18/2012  . Shingles 11/02/2011  . BPPV (benign paroxysmal positional vertigo) 09/16/2011  . Cerumen impaction 09/16/2011  . CONTACT DERMATITIS 02/10/2010  . Gastritis and gastroduodenitis 11/24/2009  . Essential hypertension 10/02/2008  .  Migraine 09/06/2008  . VITAMIN D DEFICIENCY 06/27/2007  . HYPERLIPIDEMIA 05/11/2007  . ANXIETY 05/11/2007  . Disorder of bone and cartilage 05/11/2007  . POLIOMYELITIS 05/08/2007  . OSTEOARTHRITIS, HANDS, BILATERAL 05/08/2007  . Fibromyalgia 05/08/2007  . URINARY INCONTINENCE, STRESS, MILD 05/08/2007   Past Medical History  Diagnosis Date  . Osteopenia   . Anxiety   . Hyperlipidemia   . Vitamin D deficiency   . Fibromyalgia   . Arthritis     OA of hands  . Migraine   . DVT (deep venous thrombosis) ([place]) 1980s  . Irritable bowel syndrome   . Fibromyalgia   . Wears glasses   . Breast cancer of upper-outer quadrant of right female breast ([place]) 05/08/2014  . S/P radiation therapy 07/11/2014 through 08/12/2014     Right breast 4250 cGy in 17 sessions, right breast boost 1200 cGy in 6 sessions    Past Surgical History  Procedure Laterality Date  . Abdominal hysterectomy  1972  . Ovarian cyst removed  1985  . Cholecystectomy  2005  . Cataract extraction      both  . Appendectomy    . Colonoscopy    . Breast surgery  1995    lumpectomy fibrocystic breast-lt  . Radioactive seed guided mastectomy with axillary sentinel lymph node biopsy Right 05/30/2014    Procedure: RADIOACTIVE SEED GUIDED PARTIAL  MASTECTOMY WITH AXILLARY SENTINEL LYMPH NODE BIOPSY;  Surgeon: Autumn Messing III, MD;  Location: [facility];  Service: General;  Laterality: Right;   Social History  Substance Use Topics  . Smoking status: Never Smoker   . Smokeless tobacco: None     Comment: non smoker  . Alcohol Use: No   Family History  Problem Relation Age of Onset  . Hypertension Father   . Cancer Father 36    esophageal - smoker  . Cancer Brother 60    breast CA  . Leukemia Mother   . Cancer Mother 2    leukemia  . Cancer Maternal Aunt 80    leukemia  . Cancer Paternal Aunt 40    breast   Allergies  Allergen  Reactions  . Amoxicillin Itching    REACTION: reacts opposite effect made infection worse.  Marland Kitchen Amoxicillin-Pot Clavulanate     REACTION: Felt like body turned inside out  . Esomeprazole Magnesium     REACTION: Nausea  . Omeprazole Nausea And Vomiting    REACTION: not effective  . Ranitidine Hcl     REACTION: not effective   Current Outpatient Prescriptions on File Prior to Visit  Medication Sig Dispense Refill  . amitriptyline (ELAVIL) 10 MG tablet TAKE 1 TABLET BY MOUTH AT BEDTIME, MAY INCREASE WEEKLY AS TOLERATED TO A MAX OF5 TABLETS (50MG ) PER NIGHT 90 tablet 3  . calcium carbonate 200 MG capsule Take 250 mg by mouth 2 (two) times daily.    . Cholecalciferol (VITAMIN D PO) Take 1 tablet by mouth 2 (two) times daily.    . fish oil-omega-3 fatty acids 1000 MG capsule Take 1 g by mouth 2 (two) times daily.    . fluticasone (FLONASE) 50 MCG/ACT nasal spray USE 1 SPRAY IN EACH NOSTRIL DAILY AS DIRECTED 16 g 1  . gabapentin (NEURONTIN) 100 MG capsule Take 1 capsule (100 mg total) by mouth at bedtime. 90 capsule 4  . SUMAtriptan (IMITREX) 100 MG tablet TAKE ONE TABLET AS NEEDED FOR MIGRAINE. MAY REPEAT IN TWO HOURS IF NEEDED *MAX OF 2 TABLETS IN 24 HOURS* 10 tablet 12   Current Facility-Administered Medications on File Prior to Visit  Medication Dose Route Frequency Provider Last Rate Last Dose  . chlorhexidine (HIBICLENS) 4 % liquid 1 application  1 application Topical Once Autumn Messing III, MD      . chlorhexidine (HIBICLENS) 4 % liquid 1 application  1 application Topical Once Autumn Messing III, MD          Review of Systems Review of Systems  Constitutional: Negative for fever, appetite change, fatigue and unexpected weight change.  Eyes: Negative for pain and visual disturbance.  Respiratory: Negative for cough and shortness of breath.   Cardiovascular: Negative for cp or palpitations    Gastrointestinal: Negative for nausea, diarrhea and constipation.  Genitourinary: Negative for  urgency and frequency.  Skin: Negative for pallor or rash   MSK pos for joint and myofasical pain  Neurological: Negative for weakness, light-headedness, numbness and pos for headaches.  Hematological: Negative for adenopathy. Does not bruise/bleed easily.  Psychiatric/Behavioral: Negative for dysphoric mood. The patient is not nervous/anxious.         Objective:   Physical Exam  Constitutional: She is oriented to person, place, and time. She appears well-developed and well-nourished. No distress.  Well appearing   HENT:  Head: Normocephalic and atraumatic.  Right Ear: External ear normal.  Left Ear: External ear normal.  Nose: Nose  normal.  Mouth/Throat: Oropharynx is clear and moist. No oropharyngeal exudate.  No sinus tenderness No temporal tenderness  No TMJ tenderness  Eyes: Conjunctivae and EOM are normal. Pupils are equal, round, and reactive to light. Right eye exhibits no discharge. Left eye exhibits no discharge. No scleral icterus.  No nystagmus  Neck: Normal range of motion and full passive range of motion without pain. Neck supple. No JVD present. Carotid bruit is not present. No tracheal deviation present. No thyromegaly present.  Cardiovascular: Normal rate, regular rhythm, normal heart sounds and intact distal pulses.  Exam reveals no gallop.   No murmur heard. Pulmonary/Chest: Effort normal and breath sounds normal. No respiratory distress. She has no wheezes. She has no rales.  No crackles  Abdominal: Soft. Bowel sounds are normal. She exhibits no distension, no abdominal bruit and no mass. There is no tenderness.  Musculoskeletal: She exhibits no edema or tenderness.  Myofascial trigger points are pos   OA changes in hands   Lymphadenopathy:    She has no cervical adenopathy.  Neurological: She is alert and oriented to person, place, and time. She has normal strength and normal reflexes. She displays no atrophy and no tremor. No cranial nerve deficit or sensory  deficit. She exhibits normal muscle tone. She displays a negative Romberg sign. Coordination and gait normal.  No focal cerebellar signs   Skin: Skin is warm and dry. No rash noted. No pallor.  Psychiatric: She has a normal mood and affect. Her behavior is normal. Thought content normal.          Assessment & Plan:   Problem List Items Addressed This Visit      Cardiovascular and Mediastinum   Essential hypertension    bp in fair control at this time  BP Readings from Last 1 Encounters:  03/04/15 118/68   No changes needed- controlled with lifestyle  Disc lifstyle change with low sodium diet and exercise        Relevant Orders   CBC with Differential/Platelet (Completed)   Comprehensive metabolic panel (Completed)   TSH (Completed)   Lipid panel (Completed)     Musculoskeletal and Integument   Fibromyalgia - Primary    Adv to continue elavil and gabapentin with caution of sedation  No change in clinical status or exam Enc low impact exercise         Other   HYPERLIPIDEMIA    Disc goals for lipids and reasons to control them Due for lipid panel  Rev low sat fat diet in detail        Relevant Orders   Comprehensive metabolic panel (Completed)   Lipid panel (Completed)    Other Visit Diagnoses    Need for influenza vaccination        Relevant Orders    Flu Vaccine QUAD 36+ mos PF IM (Fluarix & Fluzone Quad PF) (Completed)    Need for vaccination with 13-polyvalent pneumococcal conjugate vaccine        Relevant Orders    Pneumococcal conjugate vaccine 13-valent (Completed)

## 2015-03-04 NOTE — Progress Notes (Signed)
Pre visit review using our clinic review tool, if applicable. No additional management support is needed unless otherwise documented below in the visit note. 

## 2015-03-06 NOTE — Assessment & Plan Note (Signed)
bp in fair control at this time  BP Readings from Last 1 Encounters:  03/04/15 118/68   No changes needed- controlled with lifestyle  Disc lifstyle change with low sodium diet and exercise

## 2015-03-06 NOTE — Assessment & Plan Note (Signed)
Adv to continue elavil and gabapentin with caution of sedation  No change in clinical status or exam Enc low impact exercise

## 2015-03-06 NOTE — Assessment & Plan Note (Signed)
Disc goals for lipids and reasons to control them Due for lipid panel  Rev low sat fat diet in detail

## 2015-04-02 DIAGNOSIS — G5602 Carpal tunnel syndrome, left upper limb: Secondary | ICD-10-CM | POA: Diagnosis not present

## 2015-04-02 DIAGNOSIS — M1812 Unilateral primary osteoarthritis of first carpometacarpal joint, left hand: Secondary | ICD-10-CM | POA: Diagnosis not present

## 2015-04-02 DIAGNOSIS — G5601 Carpal tunnel syndrome, right upper limb: Secondary | ICD-10-CM | POA: Diagnosis not present

## 2015-04-02 DIAGNOSIS — G5621 Lesion of ulnar nerve, right upper limb: Secondary | ICD-10-CM | POA: Diagnosis not present

## 2015-04-02 DIAGNOSIS — M1811 Unilateral primary osteoarthritis of first carpometacarpal joint, right hand: Secondary | ICD-10-CM | POA: Diagnosis not present

## 2015-04-10 DIAGNOSIS — L812 Freckles: Secondary | ICD-10-CM | POA: Diagnosis not present

## 2015-04-10 DIAGNOSIS — L570 Actinic keratosis: Secondary | ICD-10-CM | POA: Diagnosis not present

## 2015-04-10 DIAGNOSIS — Z85828 Personal history of other malignant neoplasm of skin: Secondary | ICD-10-CM | POA: Diagnosis not present

## 2015-04-10 DIAGNOSIS — L821 Other seborrheic keratosis: Secondary | ICD-10-CM | POA: Diagnosis not present

## 2015-04-10 DIAGNOSIS — D1801 Hemangioma of skin and subcutaneous tissue: Secondary | ICD-10-CM | POA: Diagnosis not present

## 2015-04-30 ENCOUNTER — Encounter: Payer: Self-pay | Admitting: Adult Health

## 2015-04-30 NOTE — Progress Notes (Signed)
A birthday card was mailed to the patient today on behalf of the Survivorship Program at [facility].   Terek Bee, NP Survivorship Program [facility] [phone]  

## 2015-05-06 DIAGNOSIS — Z853 Personal history of malignant neoplasm of breast: Secondary | ICD-10-CM | POA: Diagnosis not present

## 2015-05-06 DIAGNOSIS — R1013 Epigastric pain: Secondary | ICD-10-CM | POA: Diagnosis not present

## 2015-05-06 DIAGNOSIS — K5900 Constipation, unspecified: Secondary | ICD-10-CM | POA: Diagnosis not present

## 2015-05-06 DIAGNOSIS — K219 Gastro-esophageal reflux disease without esophagitis: Secondary | ICD-10-CM | POA: Diagnosis not present

## 2015-05-07 DIAGNOSIS — C50411 Malignant neoplasm of upper-outer quadrant of right female breast: Secondary | ICD-10-CM | POA: Diagnosis not present

## 2015-05-15 ENCOUNTER — Other Ambulatory Visit: Payer: Self-pay | Admitting: Gastroenterology

## 2015-05-15 DIAGNOSIS — K219 Gastro-esophageal reflux disease without esophagitis: Secondary | ICD-10-CM | POA: Diagnosis not present

## 2015-05-15 DIAGNOSIS — K317 Polyp of stomach and duodenum: Secondary | ICD-10-CM | POA: Diagnosis not present

## 2015-05-19 ENCOUNTER — Other Ambulatory Visit: Payer: Self-pay | Admitting: Family Medicine

## 2015-05-19 ENCOUNTER — Other Ambulatory Visit: Payer: Self-pay | Admitting: General Surgery

## 2015-05-19 DIAGNOSIS — Z853 Personal history of malignant neoplasm of breast: Secondary | ICD-10-CM

## 2015-05-19 DIAGNOSIS — Z9889 Other specified postprocedural states: Secondary | ICD-10-CM

## 2015-05-27 ENCOUNTER — Ambulatory Visit
Admission: RE | Admit: 2015-05-27 | Discharge: 2015-05-27 | Disposition: A | Discharge status: Home / Self Care | Payer: Medicare Other | Source: Ambulatory Visit | Attending: General Surgery | Admitting: General Surgery

## 2015-05-27 DIAGNOSIS — Z853 Personal history of malignant neoplasm of breast: Secondary | ICD-10-CM

## 2015-05-27 DIAGNOSIS — Z9889 Other specified postprocedural states: Secondary | ICD-10-CM

## 2015-05-27 DIAGNOSIS — R928 Other abnormal and inconclusive findings on diagnostic imaging of breast: Secondary | ICD-10-CM | POA: Diagnosis not present

## 2015-08-06 ENCOUNTER — Encounter: Payer: Self-pay | Admitting: Nurse Practitioner

## 2015-08-06 ENCOUNTER — Other Ambulatory Visit (HOSPITAL_BASED_OUTPATIENT_CLINIC_OR_DEPARTMENT_OTHER): Payer: Medicare Other

## 2015-08-06 ENCOUNTER — Telehealth: Payer: Self-pay | Admitting: Nurse Practitioner

## 2015-08-06 ENCOUNTER — Ambulatory Visit (HOSPITAL_BASED_OUTPATIENT_CLINIC_OR_DEPARTMENT_OTHER): Payer: Medicare Other | Admitting: Nurse Practitioner

## 2015-08-06 VITALS — BP 135/50 | HR 70 | Temp 98.7°F | Resp 18 | Ht 64.0 in | Wt 148.0 lb

## 2015-08-06 DIAGNOSIS — M1990 Unspecified osteoarthritis, unspecified site: Secondary | ICD-10-CM

## 2015-08-06 DIAGNOSIS — I10 Essential (primary) hypertension: Secondary | ICD-10-CM

## 2015-08-06 DIAGNOSIS — C50411 Malignant neoplasm of upper-outer quadrant of right female breast: Secondary | ICD-10-CM

## 2015-08-06 DIAGNOSIS — M899 Disorder of bone, unspecified: Secondary | ICD-10-CM

## 2015-08-06 DIAGNOSIS — M949 Disorder of cartilage, unspecified: Secondary | ICD-10-CM

## 2015-08-06 DIAGNOSIS — R5383 Other fatigue: Secondary | ICD-10-CM | POA: Diagnosis not present

## 2015-08-06 LAB — CBC WITH DIFFERENTIAL/PLATELET
BASO%: 0.4 % (ref 0.0–2.0)
Basophils Absolute: 0 10*3/uL (ref 0.0–0.1)
EOS ABS: 0.1 10*3/uL (ref 0.0–0.5)
EOS%: 1.1 % (ref 0.0–7.0)
HCT: 40.5 % (ref 34.8–46.6)
HGB: 13.5 g/dL (ref 11.6–15.9)
LYMPH%: 26.1 % (ref 14.0–49.7)
MCH: 30.6 pg (ref 25.1–34.0)
MCHC: 33.3 g/dL (ref 31.5–36.0)
MCV: 91.8 fL (ref 79.5–101.0)
MONO#: 0.4 10*3/uL (ref 0.1–0.9)
MONO%: 8.6 % (ref 0.0–14.0)
NEUT%: 63.8 % (ref 38.4–76.8)
NEUTROS ABS: 3 10*3/uL (ref 1.5–6.5)
Platelets: 174 10*3/uL (ref 145–400)
RBC: 4.41 10*6/uL (ref 3.70–5.45)
RDW: 14.3 % (ref 11.2–14.5)
WBC: 4.8 10*3/uL (ref 3.9–10.3)
lymph#: 1.2 10*3/uL (ref 0.9–3.3)

## 2015-08-06 LAB — COMPREHENSIVE METABOLIC PANEL
ALT: 15 U/L (ref 0–55)
AST: 21 U/L (ref 5–34)
Albumin: 3.4 g/dL — ABNORMAL LOW (ref 3.5–5.0)
Alkaline Phosphatase: 40 U/L (ref 40–150)
Anion Gap: 6 mEq/L (ref 3–11)
BUN: 19.4 mg/dL (ref 7.0–26.0)
CHLORIDE: 109 meq/L (ref 98–109)
CO2: 29 meq/L (ref 22–29)
Calcium: 9.7 mg/dL (ref 8.4–10.4)
Creatinine: 1.2 mg/dL — ABNORMAL HIGH (ref 0.6–1.1)
EGFR: 43 mL/min/{1.73_m2} — AB (ref >90)
GLUCOSE: 71 mg/dL (ref 70–140)
POTASSIUM: 4.7 meq/L (ref 3.5–5.1)
SODIUM: 144 meq/L (ref 136–145)
TOTAL PROTEIN: 6.5 g/dL (ref 6.4–8.3)
Total Bilirubin: 0.37 mg/dL (ref 0.20–1.20)

## 2015-08-06 NOTE — Telephone Encounter (Signed)
appt made and avs printed °

## 2015-08-06 NOTE — Progress Notes (Signed)
[place]  [phone] [phone]) [phone]     ID: [name] DOB: [birth year]  MR#: [id number]  MHD#:[id number]  Patient Care Team: Abner Greenspan, MD as PCP - [place] III, MD as Consulting Physician (General Surgery) Chauncey Cruel, MD as Consulting Physician (Oncology) Arloa Koh, MD as Consulting Physician (Radiation Oncology) Rockwell Germany, RN as Registered Nurse Mauro Kaufmann, RN as Registered Nurse Holley Bouche, NP as Nurse Practitioner (Nurse Practitioner) Sylvan Cheese, NP as Nurse Practitioner (Nurse Practitioner) PCP: Loura Pardon, MD OTHER MD: Nena Polio M.D.  CHIEF COMPLAINT: Estrogen receptor positive breast cancer  CURRENT TREATMENT: tamoxifen   BREAST CANCER HISTORY: From the original intake note:  Margrette had routine bilateral screening mamm[facility]. This found the breast density to be category C. A possible mass was noted in the right breast, and on 05/03/2014 she underwent right diagnostic mammography with ultrasonography. There was a spiculated mass in the upper outer quadrant of the right breast which was new since the prior mammogram. On physical exam there was mild nodularity noted in the area in question. Ultrasound confirmed a hypoechoic irregular mass measuring 0.9 cm in the upper outer quadrant.  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  MRI of the breasts is scheduled for 05/20/2014. The patient's subsequent history is as detailed below  INTERVAL HISTORY: Atira returns today for follow-up of her breast cancer. She has been on tamoxifen since July 2016. She is  tolerating this well. She has occasional hot flashes at night and mild vaginal wetness, but otherwise no complaints. Gabapentin QHS has been helpful. The interval history is generally unremarkable.  REVIEW OF SYSTEMS: Rosely's main complaint today is fatigue. She suspects she is just "feeling her age" more these days, and longs for the energy she used to have when she was younger. 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 She has had cortisone injections to this area earlier in the year, but she did not find them helpful. She wonders if it could be related to tamoxifen use. She has chronic migraines. A detailed review of systems is otherwise stable.  PAST MEDICAL HISTORY: Past Medical History  Diagnosis Date  . Osteopenia   . Anxiety   . Hyperlipidemia   . Vitamin D deficiency   . Fibromyalgia   . Arthritis     OA of hands  . Migraine   . DVT (deep venous thrombosis) ([place]) 1980s  . Irritable bowel syndrome   . Fibromyalgia   . Wears glasses   . Breast cancer of upper-outer quadrant of right female breast ([place]) 05/08/2014  . S/P radiation therapy 07/11/2014 through 08/12/2014     Right breast 4250 cGy in 17 sessions, right breast boost 1200 cGy in 6 sessions     PAST SURGICAL HISTORY: Past Surgical History  Procedure Laterality Date  . Abdominal hysterectomy  1972  . Ovarian cyst removed  1985  . Cholecystectomy  2005  . Cataract extraction      both  . Appendectomy    . Colonoscopy    .  Breast surgery  1995    lumpectomy fibrocystic breast-lt  . Radioactive seed guided mastectomy with axillary sentinel lymph node biopsy Right 05/30/2014    Procedure: RADIOACTIVE SEED GUIDED PARTIAL MASTECTOMY WITH AXILLARY SENTINEL LYMPH NODE BIOPSY;  Surgeon: Autumn Messing III, MD;  Location: [facility];  Service: General;  Laterality: Right;    FAMILY HISTORY Family History  Problem Relation Age of Onset  . Hypertension Father   . Cancer Father 69    esophageal - smoker  . Cancer Brother 42    breast CA  . Leukemia Mother   . Cancer Mother 67    leukemia  . Cancer Maternal Aunt 80    leukemia  . Cancer Paternal Aunt 32    breast   the patient's father died at the age of 18 with gastric cancer. The patient's mother was diagnosed with chronic leukemia at age 79 and died from complications of dementia at age 96 the patient has one brother, diagnosed with breast cancer at the age of 59. She has 2 sisters. There is no other history of breast or ovarian cancer in the family.  GYNECOLOGIC HISTORY:  No LMP recorded. Patient has had a hysterectomy. Menarche age 45, first live birth age 82. The patient is GX P2. She underwent total abdominal hysterectomy with bilateral salpingo-oophorectomy 1972. She used hormone replacement for approximately 38 years, until 2011.  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 Her husband Jori Moll is currently not active in the business. Daughter [name], lives in [place], his Pres. The other daughter, [name], lives in [place] where she works as a Landscape architect. The patient has 3 grandchildren. She attends a local [employer] DIRECTIVES: The patient has named both her husband Jori Moll and her daughter Suanne Marker as Equities trader powers of attorney.   HEALTH MAINTENANCE: Social History  Substance Use Topics  . Smoking status: Never Smoker   . Smokeless tobacco: Not on file     Comment: non smoker  . Alcohol Use: No     Colonoscopy:  PAP:  Bone density:  Lipid panel:  Allergies  Allergen Reactions  . Amoxicillin Itching    REACTION: reacts opposite effect made infection worse.  Marland Kitchen Amoxicillin-Pot Clavulanate     REACTION:  Felt like body turned inside out  . Esomeprazole Magnesium     REACTION: Nausea  . Omeprazole Nausea And Vomiting    REACTION: not effective  . Ranitidine Hcl     REACTION: not effective    Current Outpatient Prescriptions  Medication Sig Dispense Refill  . amitriptyline (ELAVIL) 10 MG tablet TAKE 1 TABLET BY MOUTH AT BEDTIME, MAY INCREASE WEEKLY AS TOLERATED TO A MAX OF5 TABLETS (50MG) PER NIGHT 90 tablet 3  . calcium carbonate 200 MG capsule Take 250 mg by mouth 2 (two) times daily.    . Cholecalciferol (VITAMIN D PO) Take 1 tablet by mouth 2 (two) times daily. Pt takes 2000 units daily    . fish oil-omega-3 fatty acids 1000 MG capsule Take 1 g by mouth 2 (two) times daily.    Marland Kitchen gabapentin (NEURONTIN) 100 MG capsule Take 1 capsule (100 mg total) by mouth at bedtime. 90 capsule 4  . tamoxifen (NOLVADEX) 20 MG tablet Take 1 tablet by mouth daily.    . fluticasone (FLONASE) 50 MCG/ACT nasal spray USE 1 SPRAY IN EACH NOSTRIL DAILY AS DIRECTED (Patient not taking:  Reported on 08/06/2015) 16 g 1  . SUMAtriptan (IMITREX) 100 MG tablet TAKE ONE TABLET AS NEEDED FOR MIGRAINE. MAY REPEAT IN TWO HOURS IF NEEDED *MAX OF 2 TABLETS IN 24 HOURS* (Patient not taking: Reported on 08/06/2015) 10 tablet 11   No current facility-administered medications for this visit.   Facility-Administered Medications Ordered in Other Visits  Medication Dose Route Frequency Provider Last Rate Last Dose  . chlorhexidine (HIBICLENS) 4 % liquid 1 application  1 application Topical Once Autumn Messing III, MD      . chlorhexidine (HIBICLENS) 4 % liquid 1 application  1 application Topical Once Autumn Messing III, MD        OBJECTIVE: Older white woman in no acute distress Filed Vitals:   08/06/15 1033  BP: 135/50  Pulse: 70  Temp: 98.7 F (37.1 C)  Resp: 18     Body mass index is 25.39 kg/(m^2).    ECOG FS:0 - Asymptomatic  Skin: warm, dry  HEENT: sclerae anicteric, conjunctivae pink, oropharynx clear. No thrush or  mucositis.  Lymph Nodes: No cervical or supraclavicular lymphadenopathy  Lungs: clear to auscultation bilaterally, no rales, wheezes, or rhonci  Heart: regular rate and rhythm  Abdomen: round, soft, non tender, positive bowel sounds  Musculoskeletal: No focal spinal tenderness, no peripheral edema, arthritic changes to bilateral hands  Neuro: non focal, well oriented, positive affect  Breasts: right breast status post lumpectomy and radiation. Palpable scar tissue to incision scar. No evidence of recurrent disease. Right axilla benign. Left breast unremarkable.  LAB RESULTS:  CMP     Component Value Date/Time   NA 144 08/06/2015 1022   NA 140 03/04/2015 1017   K 4.7 08/06/2015 1022   K 4.6 03/04/2015 1017   CL 104 03/04/2015 1017   CO2 29 08/06/2015 1022   CO2 30 03/04/2015 1017   GLUCOSE 71 08/06/2015 1022   GLUCOSE 90 03/04/2015 1017   BUN 19.4 08/06/2015 1022   BUN 21 03/04/2015 1017   CREATININE 1.2* 08/06/2015 1022   CREATININE 1.29* 03/04/2015 1017   CALCIUM 9.7 08/06/2015 1022   CALCIUM 9.2 03/04/2015 1017   PROT 6.5 08/06/2015 1022   PROT 6.6 03/04/2015 1017   ALBUMIN 3.4* 08/06/2015 1022   ALBUMIN 3.6 03/04/2015 1017   AST 21 08/06/2015 1022   AST 23 03/04/2015 1017   ALT 15 08/06/2015 1022   ALT 14 03/04/2015 1017   ALKPHOS 40 08/06/2015 1022   ALKPHOS 38* 03/04/2015 1017   BILITOT 0.37 08/06/2015 1022   BILITOT 0.5 03/04/2015 1017   GFRNONAA 50.17 10/07/2009 0952   GFRAA 58 05/11/2007 1023    INo results found for: SPEP, UPEP  Lab Results  Component Value Date   WBC 4.8 08/06/2015   NEUTROABS 3.0 08/06/2015   HGB 13.5 08/06/2015   HCT 40.5 08/06/2015   MCV 91.8 08/06/2015   PLT 174 08/06/2015      Chemistry      Component Value Date/Time   NA 144 08/06/2015 1022   NA 140 03/04/2015 1017   K 4.7 08/06/2015 1022   K 4.6 03/04/2015 1017   CL 104 03/04/2015 1017   CO2 29 08/06/2015 1022   CO2 30 03/04/2015 1017   BUN 19.4 08/06/2015 1022    BUN 21 03/04/2015 1017   CREATININE 1.2* 08/06/2015 1022   CREATININE 1.29* 03/04/2015 1017      Component Value Date/Time   CALCIUM 9.7 08/06/2015 1022   CALCIUM 9.2 03/04/2015 1017   ALKPHOS 40 08/06/2015  1022   ALKPHOS 38* 03/04/2015 1017   AST 21 08/06/2015 1022   AST 23 03/04/2015 1017   ALT 15 08/06/2015 1022   ALT 14 03/04/2015 1017   BILITOT 0.37 08/06/2015 1022   BILITOT 0.5 03/04/2015 1017       No results found for: LABCA2  No components found for: HMCNO709  No results for input(s): INR in the last 168 hours.  Urinalysis No results found for: COLORURINE, APPEARANCEUR, LABSPEC, PHURINE, GLUCOSEU, HGBUR, BILIRUBINUR, KETONESUR, PROTEINUR, UROBILINOGEN, NITRITE, LEUKOCYTESUR  STUDIES: No results found.  EXAM: DIGITAL DIAGNOSTIC BILATERAL MAMMOGRAM WITH 3D TOMOSYNTHESIS AND CAD  COMPARISON: Previous exam(s).  ACR Breast Density Category b: There are scattered areas of fibroglandular density.  FINDINGS: Interval density and distortion in the superior right breast, consistent with history of lumpectomy. No evidence of recurrent disease. No suspicious calcifications, masses or areas of distortion are seen in the bilateral breasts.  Mammographic images were processed with CAD.  IMPRESSION: Interval postlumpectomy changes in the superior right breast. No mammographic evidence of malignancy in the bilateral breasts.  RECOMMENDATION: Diagnostic mammogram is suggested in 1 year. (Code:DM-B-01Y)  I have discussed the findings and recommendations with the patient. Results were also provided in writing at the conclusion of the visit. If applicable, a reminder letter will be sent to the patient regarding the next appointment.  BI-RADS CATEGORY 2: Benign.   Electronically Signed  By: Ammie Ferrier M.D.  On: 05/27/2015 10:51  ASSESSMENT: 75 y.o. BRCA negative [place] woman status post right breast biopsy 05/03/2014 for a clinical T1b N0,  stage IA invasive ductal carcinoma, grade 1 or 2, estrogen receptor positive, progesterone receptor negative, with an MIB-1 of 12%, and HER-2/neu equivocal  (1) right lumpectomy and sentinel lymph node sampling 05/30/2014 showed a pT1c pN0, stage IA invasive ductal carcinoma, grade 1, repeat HER-2 again negative.  (2) genetics testing 05/17/2014 showed no deleterious in ATM, BARD1, BRCA1, BRCA2, BRIP1, CDH1, CHEK2, MRE11A, MUTYH, NBN, NF1, PALB2, PTEN, RAD50, RAD51C, RAD51D, and TP53. Cephus [place]ing genetics, BreastNext panel)  (3) Oncotype score of 25 predicts a risk of distant recurrence of 16% if the patient's only systemic treatment is tamoxifen for 5 years; it also predicts a benefit from chemotherapy in the 5% range. Given this marginal benefit the patient opted against adjuvant chemotherapy  (4) adjuvant radiation completed 08/12/2014: Right breast 4250 cGy in 17 sessions, right breast boost 1200 cGy in 6 sessions  (5) started tamoxifen 10/10/2014  (a) status post hysterectomy and bilateral salpingo-oophorectomy in 1972  PLAN: Edyn is doing well as far as her breast cancer is concerned. Her most recent mammogram was benign. She is tolerating the tamoxifen well and will continue this drug for 5 years of antiestrogen therapy. I do not believe her arthritis is going to be related to use of this drug. I suggested she try tylenol arthritis, as NSAIDs aggravate her stomach.   Aleyza will see Dr. [name] this summer, and return in Fall for follow up with [place]. She understands and agrees with this plan. She knows the goal of treatment in her case is cure. She has been encouraged to call with any issues that might arise before her next visit here.   Laurie Panda, NP   08/06/2015 11:13 AM

## 2015-09-15 DIAGNOSIS — L237 Allergic contact dermatitis due to plants, except food: Secondary | ICD-10-CM | POA: Diagnosis not present

## 2015-09-16 ENCOUNTER — Telehealth: Payer: Self-pay

## 2015-09-16 NOTE — Telephone Encounter (Signed)
PLEASE NOTE: All timestamps contained within this report are represented as [place] Standard Time. CONFIDENTIALTY NOTICE: This fax transmission is intended only for the addressee. It contains information that is legally privileged, confidential or otherwise protected from use or disclosure. If you are not the intended recipient, you are strictly prohibited from reviewing, disclosing, copying using or disseminating any of this information or taking any action in reliance on or regarding this information. If you have received this fax in error, please notify us immediately by telephone so that we can arrange for its return to [place]. Phone: [phone], Toll-Free: [phone], Fax: [phone] Page: 1 of 2 Call Id: [id number] [facility] Patient Name: Sarah Young Gender: Female DOB: 11/04/1940 Age: 75 Y 80 M 15 D Return Phone Number: [phone] (Primary) Address: City/State/Zip: [place] [address] Client [facility] - Client Client Site [place] Contact Type Call Who Is Calling Patient / Member / Family / Caregiver Call Type Triage / Clinical Caller Name Lorely Demir Relationship To Patient Self Return Phone Number [phone] (Primary) Chief Complaint Poison [place], [place] Reason for Call Symptomatic / Request for Health Information Appointment Disposition EMR Appointment Not Necessary Info pasted into Epic No PreDisposition Did not know what to do Translation No Nurse Assessment Nurse: Zenia Resides, RN, Shirlee Limerick Date/Time Eilene Ghazi Time): 09/15/2015 5:08:53 PM Confirm and document reason for call. If symptomatic, describe symptoms. You must click the next button to save text entered. ---Caller states she has poison ivy on neck, side of face, and both arms. Calamine lotion is not doing trick. Has the patient traveled  out of the country within the last 30 days? ---Not Applicable Does the patient have any new or worsening symptoms? ---Yes Will a triage be completed? ---Yes Related visit to physician within the last 2 weeks? ---N/A Does the PT have any chronic conditions? (i.e. diabetes, asthma, etc.) ---Yes List chronic conditions. ---Hx of breast cancer Is this a behavioral health or substance abuse call? ---No Guidelines Guideline Title Affirmed Question Affirmed Notes Nurse Date/Time (Eastern Time) Poison Ivy - [place] [1] Severe poison ivy, oak, or sumac reaction in the past AND [2] face involved Shea Evans 09/15/2015 5:09:50 PM Disp. Time Eilene Ghazi Time) Disposition Final User PLEASE NOTE: All timestamps contained within this report are represented as [place] Standard Time. CONFIDENTIALTY NOTICE: This fax transmission is intended only for the addressee. It contains information that is legally privileged, confidential or otherwise protected from use or disclosure. If you are not the intended recipient, you are strictly prohibited from reviewing, disclosing, copying using or disseminating any of this information or taking any action in reliance on or regarding this information. If you have received this fax in error, please notify us immediately by telephone so that we can arrange for its return to [place]. Phone: [phone], Toll-Free: [phone], Fax: [phone] Page: 2 of 2 Call Id: [id number] 09/15/2015 5:13:55 PM See Physician within 4 Hours (or PCP triage) Yes Zenia Resides, RN, Abran Duke Understands: Yes Disagree/Comply: Comply Care Advice Given Per Guideline SEE PHYSICIAN WITHIN 4 HOURS (or PCP triage): * IF OFFICE WILL BE CLOSED AND NO PCP TRIAGE: You need to be seen within the next 3 or 4 hours. A nearby Urgent Care Center is often a good source of care. Another choice is to go to the ER. Go sooner if you become worse.  CALL BACK IF: * You become worse. CARE ADVICE given per [place] (Adult) guideline. Referrals [facility]

## 2015-09-16 NOTE — Telephone Encounter (Signed)
Pt went to [facility] in [place] and received medication; pt will cb if needed.

## 2015-10-03 ENCOUNTER — Other Ambulatory Visit: Payer: Self-pay | Admitting: Oncology

## 2015-10-29 ENCOUNTER — Other Ambulatory Visit: Payer: Self-pay | Admitting: Family Medicine

## 2015-11-03 DIAGNOSIS — C50411 Malignant neoplasm of upper-outer quadrant of right female breast: Secondary | ICD-10-CM | POA: Diagnosis not present

## 2015-11-10 ENCOUNTER — Other Ambulatory Visit: Payer: Self-pay | Admitting: General Surgery

## 2015-11-10 DIAGNOSIS — N63 Unspecified lump in unspecified breast: Secondary | ICD-10-CM

## 2015-11-11 ENCOUNTER — Ambulatory Visit
Admission: RE | Admit: 2015-11-11 | Discharge: 2015-11-11 | Disposition: A | Discharge status: Home / Self Care | Payer: Medicare Other | Source: Ambulatory Visit | Attending: General Surgery | Admitting: General Surgery

## 2015-11-11 DIAGNOSIS — N63 Unspecified lump in unspecified breast: Secondary | ICD-10-CM

## 2015-11-11 DIAGNOSIS — N6489 Other specified disorders of breast: Secondary | ICD-10-CM | POA: Diagnosis not present

## 2015-11-11 DIAGNOSIS — R922 Inconclusive mammogram: Secondary | ICD-10-CM | POA: Diagnosis not present

## 2015-12-15 ENCOUNTER — Ambulatory Visit (INDEPENDENT_AMBULATORY_CARE_PROVIDER_SITE_OTHER): Payer: Medicare Other | Admitting: Family Medicine

## 2015-12-15 ENCOUNTER — Encounter: Payer: Self-pay | Admitting: Family Medicine

## 2015-12-15 VITALS — BP 128/74 | HR 67 | Temp 98.3°F | Ht 64.0 in | Wt 153.5 lb

## 2015-12-15 DIAGNOSIS — Z23 Encounter for immunization: Secondary | ICD-10-CM | POA: Diagnosis not present

## 2015-12-15 DIAGNOSIS — R600 Localized edema: Secondary | ICD-10-CM | POA: Diagnosis not present

## 2015-12-15 NOTE — Assessment & Plan Note (Addendum)
Worse in L ankle and foot than the R Mild pitting No trauma/injury Is on gabapentin which can cause fluid retention No cardiac symptoms or varicose veins  May still be venous insuff Disc elevation of legs when sitting DASH diet -avoid sodium Inc water intake  supp stockings to knee if needed Lab today

## 2015-12-15 NOTE — Patient Instructions (Addendum)
Elevate feet when not walking  Alert me if pain in foot/ankle or leg or if redness or swelling in calf or thigh  Avoid sodium as much as possible - DASH eating plan handout  Knee high support socks are ok during the day if you want to wear them  Keep cool as much as you can Labs today - will update you

## 2015-12-15 NOTE — Progress Notes (Signed)
Pre visit review using our clinic review tool, if applicable. No additional management support is needed unless otherwise documented below in the visit note. 

## 2015-12-15 NOTE — Progress Notes (Signed)
Subjective:    Patient ID: Sarah Young, female    DOB: 03-18-1941, 75 y.o.   MRN: [id number]  HPI Here for L foot swelling It started last week when she put her feet up - just noticed it  By Saturday it was worse  It feels tight and a little tingly  Not hot  Perhaps leg above it looks a little red   No trauma  No scratches/scrapes or bug bites  No sprains  Walking - regularly-no missteps  No foot pain at all  Not bruised    Wt Readings from Last 3 Encounters:  12/15/15 153 lb 8 oz (69.6 kg)  08/06/15 148 lb (67.1 kg)  03/04/15 149 lb (67.6 kg)   BP Readings from Last 3 Encounters:  12/15/15 128/74  08/06/15 (!) 135/50  03/04/15 118/68    Patient Active Problem List   Diagnosis Date Noted  . Pedal edema 12/15/2015  . Tick bite 10/04/2014  . Family history of breast cancer in female 05/17/2014  . Breast cancer of upper-outer quadrant of right female breast ([place]) 05/08/2014  . GERD (gastroesophageal reflux disease) 03/01/2014  . Lumbar degenerative disc disease 08/15/2013  . Chronic pain of scapula 08/15/2013  . Tick bite of thigh 10/18/2012  . Other malaise and fatigue 10/18/2012  . Shingles 11/02/2011  . BPPV (benign paroxysmal positional vertigo) 09/16/2011  . Cerumen impaction 09/16/2011  . CONTACT DERMATITIS 02/10/2010  . Gastritis and gastroduodenitis 11/24/2009  . Essential hypertension 10/02/2008  . Migraine 09/06/2008  . VITAMIN D DEFICIENCY 06/27/2007  . HYPERLIPIDEMIA 05/11/2007  . ANXIETY 05/11/2007  . Disorder of bone and cartilage 05/11/2007  . POLIOMYELITIS 05/08/2007  . OSTEOARTHRITIS, HANDS, BILATERAL 05/08/2007  . Fibromyalgia 05/08/2007  . URINARY INCONTINENCE, STRESS, MILD 05/08/2007   Past Medical History:  Diagnosis Date  . Anxiety   . Arthritis    OA of hands  . Breast cancer of upper-outer quadrant of right female breast ([place]) 05/08/2014  . DVT (deep venous thrombosis) ([place]) 1980s  . Fibromyalgia   . Fibromyalgia   .  Hyperlipidemia   . Irritable bowel syndrome   . Migraine   . Osteopenia   . S/P radiation therapy 07/11/2014 through 08/12/2014    Right breast 4250 cGy in 17 sessions, right breast boost 1200 cGy in 6 sessions   . Vitamin D deficiency   . Wears glasses    Past Surgical History:  Procedure Laterality Date  . ABDOMINAL HYSTERECTOMY  1972  . APPENDECTOMY    . BREAST SURGERY  1995   lumpectomy fibrocystic breast-lt  . CATARACT EXTRACTION     both  . CHOLECYSTECTOMY  2005  . COLONOSCOPY    . ovarian cyst removed  1985  . RADIOACTIVE SEED GUIDED MASTECTOMY WITH AXILLARY SENTINEL LYMPH NODE BIOPSY Right 05/30/2014   Procedure: RADIOACTIVE SEED GUIDED PARTIAL MASTECTOMY WITH AXILLARY SENTINEL LYMPH NODE BIOPSY;  Surgeon: Autumn Messing III, MD;  Location: [facility];  Service: General;  Laterality: Right;   Social History  Substance Use Topics  . Smoking status: Never Smoker  . Smokeless tobacco: Never Used     Comment: non smoker  . Alcohol use No   Family History  Problem Relation Age of Onset  . Hypertension Father   . Cancer Father 17    esophageal - smoker  . Cancer Brother 57    breast CA  . Leukemia Mother   . Cancer Mother 58    leukemia  . Cancer Maternal Aunt  80    leukemia  . Cancer Paternal Aunt 26    breast   Allergies  Allergen Reactions  . Amoxicillin Itching    REACTION: reacts opposite effect made infection worse.  Marland Kitchen Amoxicillin-Pot Clavulanate     REACTION: Felt like body turned inside out  . Esomeprazole Magnesium     REACTION: Nausea  . Omeprazole Nausea And Vomiting    REACTION: not effective  . Ranitidine Hcl     REACTION: not effective   Current Outpatient Prescriptions on File Prior to Visit  Medication Sig Dispense Refill  . amitriptyline (ELAVIL) 10 MG tablet TAKE 1 TABLET AT BEDTIME. MAY INCREASE BY 1 TABLET WEEKLY AS TOLERATED UP TO MAX OF 5 TABS/NIGHT 90  tablet 0  . calcium carbonate 200 MG capsule Take 250 mg by mouth 2 (two) times daily.    . Cholecalciferol (VITAMIN D PO) Take 1 tablet by mouth 2 (two) times daily. Pt takes 2000 units daily    . fish oil-omega-3 fatty acids 1000 MG capsule Take 1 g by mouth 2 (two) times daily.    . fluticasone (FLONASE) 50 MCG/ACT nasal spray USE 1 SPRAY IN EACH NOSTRIL DAILY AS DIRECTED 16 g 1  . gabapentin (NEURONTIN) 100 MG capsule Take 1 capsule (100 mg total) by mouth at bedtime. 90 capsule 4  . SUMAtriptan (IMITREX) 100 MG tablet TAKE ONE TABLET AS NEEDED FOR MIGRAINE. MAY REPEAT IN TWO HOURS IF NEEDED *MAX OF 2 TABLETS IN 24 HOURS* 10 tablet 11  . tamoxifen (NOLVADEX) 20 MG tablet TAKE 1 TABLET (20 MG TOTAL) BY MOUTH DAILY. 90 tablet 12   Current Facility-Administered Medications on File Prior to Visit  Medication Dose Route Frequency Provider Last Rate Last Dose  . chlorhexidine (HIBICLENS) 4 % liquid 1 application  1 application Topical Once Autumn Messing III, MD      . chlorhexidine (HIBICLENS) 4 % liquid 1 application  1 application Topical Once Autumn Messing III, MD         Review of Systems Review of Systems  Constitutional: Negative for fever, appetite change, fatigue and unexpected weight change.  Eyes: Negative for pain and visual disturbance.  Respiratory: Negative for cough and shortness of breath.  pos for pedal edema  Cardiovascular: Negative for cp or palpitations    Gastrointestinal: Negative for nausea, diarrhea and constipation.  Genitourinary: Negative for urgency and frequency.  Skin: Negative for pallor or rash  neg for bruising  Neurological: Negative for weakness, light-headedness, numbness and headaches.  Hematological: Negative for adenopathy. Does not bruise/bleed easily.  Psychiatric/Behavioral: Negative for dysphoric mood. The patient is not nervous/anxious.         Objective:   Physical Exam  Constitutional: She appears well-developed and well-nourished. No distress.    Well appearing   HENT:  Head: Normocephalic and atraumatic.  Mouth/Throat: Oropharynx is clear and moist.  Eyes: Conjunctivae and EOM are normal. Pupils are equal, round, and reactive to light.  Neck: Normal range of motion. Neck supple. No JVD present. Carotid bruit is not present. No thyromegaly present.  Cardiovascular: Normal rate, regular rhythm, normal heart sounds and intact distal pulses.  Exam reveals no gallop.   Nl femoral and pedal pulses   Pulmonary/Chest: Effort normal and breath sounds normal. No respiratory distress. She has no wheezes. She has no rales.  No crackles  Abdominal: Soft. Bowel sounds are normal. She exhibits no distension, no abdominal bruit and no mass. There is no tenderness.  Musculoskeletal: She exhibits edema.  She exhibits no tenderness or deformity.  Trace edema in R ankle and tr to once plus in L ankle Nl rom ankles and feet-no tenderness No ecchymosis or skin interruption  No leg swelling/warmth or palp cords-nl rom of knees and hips  Nl gait Nl perf and sensation  Neg homann's sign  Lymphadenopathy:    She has no cervical adenopathy.  Neurological: She is alert. She has normal reflexes. She displays no atrophy and no tremor. No cranial nerve deficit or sensory deficit. She exhibits normal muscle tone. Coordination and gait normal.  Skin: Skin is warm and dry. No rash noted.  Psychiatric: She has a normal mood and affect.          Assessment & Plan:   Problem List Items Addressed This Visit      Other   Pedal edema    Worse in L ankle and foot than the R Mild pitting No trauma/injury Is on gabapentin which can cause fluid retention No cardiac symptoms or varicose veins  May still be venous insuff Disc elevation of legs when sitting DASH diet -avoid sodium Inc water intake  supp stockings to knee if needed Lab today      Relevant Orders   Comprehensive metabolic panel   TSH   Brain natriuretic peptide    Other Visit  Diagnoses    Need for influenza vaccination    -  Primary   Relevant Orders   Flu Vaccine QUAD 36+ mos IM (Completed)

## 2015-12-16 LAB — COMPREHENSIVE METABOLIC PANEL
ALK PHOS: 33 U/L — AB (ref 39–117)
ALT: 12 U/L (ref 0–35)
AST: 19 U/L (ref 0–37)
Albumin: 3.6 g/dL (ref 3.5–5.2)
BILIRUBIN TOTAL: 0.3 mg/dL (ref 0.2–1.2)
BUN: 24 mg/dL — ABNORMAL HIGH (ref 6–23)
CHLORIDE: 105 meq/L (ref 96–112)
CO2: 32 mEq/L (ref 19–32)
Calcium: 9.3 mg/dL (ref 8.4–10.5)
Creatinine, Ser: 1.15 mg/dL (ref 0.40–1.20)
GFR: 48.81 mL/min — AB (ref >60.00)
GLUCOSE: 83 mg/dL (ref 70–99)
POTASSIUM: 4.9 meq/L (ref 3.5–5.1)
SODIUM: 140 meq/L (ref 135–145)
TOTAL PROTEIN: 6.4 g/dL (ref 6.0–8.3)

## 2015-12-16 LAB — BRAIN NATRIURETIC PEPTIDE: Pro B Natriuretic peptide (BNP): 53 pg/mL (ref 0.0–100.0)

## 2015-12-16 LAB — TSH: TSH: 3.08 u[IU]/mL (ref 0.35–4.50)

## 2015-12-24 ENCOUNTER — Telehealth: Payer: Self-pay | Admitting: Family Medicine

## 2015-12-24 NOTE — Telephone Encounter (Signed)
Pt called. Having trouble getting onto mychart to see labs results. L ankle swelling worse than R. Please advise.  Does she need another OV?   [place]

## 2015-12-24 NOTE — Telephone Encounter (Signed)
Labs look ok  Please help her out with mychart if she wants to keep it or cancel it if she does not  Has she tried elevation/ decreased sodium/increased water/support hose ? - let me know if any of these helped at all?  Is the swelling improved in the am when she gets up ?   thanks

## 2015-12-25 NOTE — Telephone Encounter (Signed)
Sounds good , thanks - will plan to hear from her if symptoms do not continue to improve or worsen

## 2015-12-25 NOTE — Telephone Encounter (Signed)
Pt notified of lab results and Dr. Marliss Coots comments. Pt said she was able to unlock her mychart yesterday so she is okay, pt said her leg swelling is better but she hasn't tried support hose so she will get them, pt also said she has cut out her sodium and she will make sure to increase water intake and elevate them. Pt will update [place] if sxs worsen

## 2016-01-12 DIAGNOSIS — H35363 Drusen (degenerative) of macula, bilateral: Secondary | ICD-10-CM | POA: Diagnosis not present

## 2016-01-12 DIAGNOSIS — Z961 Presence of intraocular lens: Secondary | ICD-10-CM | POA: Diagnosis not present

## 2016-01-12 DIAGNOSIS — H26493 Other secondary cataract, bilateral: Secondary | ICD-10-CM | POA: Diagnosis not present

## 2016-01-22 ENCOUNTER — Other Ambulatory Visit: Payer: Self-pay | Admitting: Oncology

## 2016-01-22 ENCOUNTER — Other Ambulatory Visit: Payer: Self-pay | Admitting: Family Medicine

## 2016-01-23 NOTE — Telephone Encounter (Signed)
Pt has had a recent acute appt but no recent f/u or CPE, please advise

## 2016-01-23 NOTE — Telephone Encounter (Signed)
Please refill for 6 months 

## 2016-01-24 ENCOUNTER — Other Ambulatory Visit: Payer: Self-pay | Admitting: Family Medicine

## 2016-02-04 ENCOUNTER — Other Ambulatory Visit: Payer: Self-pay | Admitting: *Deleted

## 2016-02-04 DIAGNOSIS — Z853 Personal history of malignant neoplasm of breast: Secondary | ICD-10-CM

## 2016-02-05 ENCOUNTER — Other Ambulatory Visit (HOSPITAL_BASED_OUTPATIENT_CLINIC_OR_DEPARTMENT_OTHER): Payer: Medicare Other

## 2016-02-05 ENCOUNTER — Ambulatory Visit (HOSPITAL_BASED_OUTPATIENT_CLINIC_OR_DEPARTMENT_OTHER): Payer: Medicare Other | Admitting: Oncology

## 2016-02-05 VITALS — BP 138/42 | HR 77 | Temp 97.9°F | Resp 18 | Ht 64.0 in | Wt 151.4 lb

## 2016-02-05 DIAGNOSIS — C50411 Malignant neoplasm of upper-outer quadrant of right female breast: Secondary | ICD-10-CM

## 2016-02-05 DIAGNOSIS — Z170 Estrogen receptor positive status [ER+]: Secondary | ICD-10-CM

## 2016-02-05 DIAGNOSIS — Z853 Personal history of malignant neoplasm of breast: Secondary | ICD-10-CM

## 2016-02-05 LAB — COMPREHENSIVE METABOLIC PANEL
ALBUMIN: 3.2 g/dL — AB (ref 3.5–5.0)
ALK PHOS: 41 U/L (ref 40–150)
ALT: 11 U/L (ref 0–55)
ANION GAP: 8 meq/L (ref 3–11)
AST: 19 U/L (ref 5–34)
BUN: 21.6 mg/dL (ref 7.0–26.0)
CO2: 27 meq/L (ref 22–29)
Calcium: 9.5 mg/dL (ref 8.4–10.4)
Chloride: 107 mEq/L (ref 98–109)
Creatinine: 1.2 mg/dL — ABNORMAL HIGH (ref 0.6–1.1)
EGFR: 42 mL/min/{1.73_m2} — AB (ref >90)
GLUCOSE: 89 mg/dL (ref 70–140)
POTASSIUM: 5.1 meq/L (ref 3.5–5.1)
SODIUM: 142 meq/L (ref 136–145)
Total Bilirubin: 0.34 mg/dL (ref 0.20–1.20)
Total Protein: 6.5 g/dL (ref 6.4–8.3)

## 2016-02-05 LAB — CBC WITH DIFFERENTIAL/PLATELET
BASO%: 0.9 % (ref 0.0–2.0)
BASOS ABS: 0.1 10*3/uL (ref 0.0–0.1)
EOS ABS: 0.1 10*3/uL (ref 0.0–0.5)
EOS%: 0.9 % (ref 0.0–7.0)
HCT: 39.9 % (ref 34.8–46.6)
HGB: 13.2 g/dL (ref 11.6–15.9)
LYMPH%: 27.5 % (ref 14.0–49.7)
MCH: 30.3 pg (ref 25.1–34.0)
MCHC: 33.1 g/dL (ref 31.5–36.0)
MCV: 91.7 fL (ref 79.5–101.0)
MONO#: 0.4 10*3/uL (ref 0.1–0.9)
MONO%: 7.7 % (ref 0.0–14.0)
NEUT#: 3.4 10*3/uL (ref 1.5–6.5)
NEUT%: 63 % (ref 38.4–76.8)
PLATELETS: 179 10*3/uL (ref 145–400)
RBC: 4.35 10*6/uL (ref 3.70–5.45)
RDW: 13.9 % (ref 11.2–14.5)
WBC: 5.5 10*3/uL (ref 3.9–10.3)
lymph#: 1.5 10*3/uL (ref 0.9–3.3)

## 2016-02-05 MED ORDER — TAMOXIFEN CITRATE 20 MG PO TABS: ORAL_TABLET | ORAL | 12 refills | Status: DC

## 2016-02-05 NOTE — Progress Notes (Signed)
[facility]  [phone] [phone]     ID: Sarah Young DOB: July 17, 1940  MR#: [id number]  IOX#:[id number]  Patient Care Team: Abner Greenspan, MD as PCP - Toftrees III, MD as Consulting Physician (General Surgery) Chauncey Cruel, MD as Consulting Physician (Oncology) Arloa Koh, MD as Consulting Physician (Radiation Oncology) Rockwell Germany, RN as Registered Nurse Mauro Kaufmann, RN as Registered Nurse Holley Bouche, NP as Nurse Practitioner (Nurse Practitioner) Sylvan Cheese, NP as Nurse Practitioner (Nurse Practitioner) PCP: Loura Pardon, MD OTHER MD: Nena Polio M.D.  CHIEF COMPLAINT: Estrogen receptor positive breast cancer  CURRENT TREATMENT: tamoxifen   BREAST CANCER HISTORY: From the original intake note:  Sarah Young had routine bilateral screening mammography [facility]. This found the breast density to be category C. A possible mass was noted in the right breast, and on 05/03/2014 she underwent right diagnostic mammography with ultrasonography. There was a spiculated mass in the upper outer quadrant of the right breast which was new since the prior mammogram. On physical exam there was mild nodularity noted in the area in question. Ultrasound confirmed a hypoechoic irregular mass measuring 0.9 cm in the upper outer quadrant.  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  MRI of the breasts is scheduled for 05/20/2014. The patient's subsequent history is as detailed below  INTERVAL HISTORY: Sarah Young returns today for follow-up of her estrogen receptor positive breast cancer. She continues on tamoxifen,  with excellent tolerance. She obtains it at approximately $8 a month. Hot flashes and vaginal discharge are not major concerns  REVIEW OF SYSTEMS: She has been concerned about a thickened area in the medial aspect of her right breast. This is been evaluated by surgery, her primary care physician, and with mammography and ultrasound in August. Aside from this she continues to work full-time. She exercises chiefly by walking.  PAST MEDICAL HISTORY: Past Medical History:  Diagnosis Date  . Anxiety   . Arthritis    OA of hands  . Breast cancer of upper-outer quadrant of right female breast ([place]) 05/08/2014  . DVT (deep venous thrombosis) ([place]) 1980s  . Fibromyalgia   . Fibromyalgia   . Hyperlipidemia   . Irritable bowel syndrome   . Migraine   . Osteopenia   . S/P radiation therapy 07/11/2014 through 08/12/2014    Right breast 4250 cGy in 17 sessions, right breast boost 1200 cGy in 6 sessions   . Vitamin D deficiency   . Wears glasses     PAST SURGICAL HISTORY: Past Surgical History:  Procedure Laterality Date  . ABDOMINAL HYSTERECTOMY  1972  . APPENDECTOMY    . BREAST SURGERY  1995   lumpectomy fibrocystic breast-lt  . CATARACT EXTRACTION     both  . CHOLECYSTECTOMY  2005  . COLONOSCOPY    . ovarian cyst removed  1985  . RADIOACTIVE SEED GUIDED MASTECTOMY WITH AXILLARY SENTINEL LYMPH NODE BIOPSY Right 05/30/2014   Procedure: RADIOACTIVE SEED GUIDED PARTIAL MASTECTOMY WITH AXILLARY SENTINEL LYMPH NODE BIOPSY;  Surgeon: Autumn Messing III, MD;  Location: [facility];  Service: General;  Laterality: Right;    FAMILY HISTORY Family History  Problem Relation  Age of Onset  . Hypertension Father   . Cancer Father 34    esophageal - smoker  . Cancer Brother 38    breast CA  . Leukemia Mother   . Cancer Mother 16    leukemia  . Cancer Maternal Aunt 80    leukemia  . Cancer Paternal Aunt 32     breast   the patient's father died at the age of 35 with gastric cancer. The patient's mother was diagnosed with chronic leukemia at age 31 and died from complications of dementia at age 20 the patient has one brother, diagnosed with breast cancer at the age of 22. She has 2 sisters. There is no other history of breast or ovarian cancer in the family.  GYNECOLOGIC HISTORY:  No LMP recorded. Patient has had a hysterectomy. Menarche age 45, first live birth age 15. The patient is GX P2. She underwent total abdominal hysterectomy with bilateral salpingo-oophorectomy 1972. She used hormone replacement for approximately 38 years, until 2011.  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 Her husband Sarah Young is currently not active in the business. Daughter Sarah Young, lives in [place], his Pres. The other daughter, Sarah Young, lives in [place] where she works as a Landscape architect. The patient has 3 grandchildren. She attends a local [employer] DIRECTIVES: The patient has named both her husband Sarah Young and her daughter Sarah Young as Equities trader powers of attorney.   HEALTH MAINTENANCE: Social History  Substance Use Topics  . Smoking status: Never Smoker  . Smokeless tobacco: Never Used     Comment: non smoker  . Alcohol use No     Colonoscopy:  PAP:  Bone density:  Lipid panel:  Allergies  Allergen Reactions  . Amoxicillin Itching    REACTION: reacts opposite effect made infection worse.  Marland Kitchen Amoxicillin-Pot Clavulanate     REACTION: Felt like body turned inside out  . Esomeprazole Magnesium     REACTION: Nausea  . Omeprazole Nausea And Vomiting    REACTION: not effective  . Ranitidine Hcl     REACTION: not effective    Current Outpatient Prescriptions  Medication Sig Dispense Refill  . amitriptyline (ELAVIL) 10 MG tablet TAKE ONE TABLET BY MOUTH AT BEDTIME. MAY INCREASE BY ONE  TABLET WEEKLY AS TOLERATED UP TO MAX OF 5 TABS/NIGHT 90 tablet 5  . calcium carbonate 200 MG capsule Take 250 mg by mouth 2 (two) times daily.    . Cholecalciferol (VITAMIN D PO) Take 1 tablet by mouth 2 (two) times daily. Pt takes 2000 units daily    . fish oil-omega-3 fatty acids 1000 MG capsule Take 1 g by mouth 2 (two) times daily.    . fluticasone (FLONASE) 50 MCG/ACT nasal spray USE 1 SPRAY IN EACH NOSTRIL DAILY AS DIRECTED 16 g 5  . gabapentin (NEURONTIN) 100 MG capsule TAKE ONE CAPSULE BY MOUTH EVERY NIGHT AT BEDTIME 90 capsule 4  . SUMAtriptan (IMITREX) 100 MG tablet TAKE ONE TABLET AS NEEDED FOR MIGRAINE. MAY REPEAT IN TWO HOURS IF NEEDED *MAX OF 2 TABLETS IN 24 HOURS* 10 tablet 11  . tamoxifen (NOLVADEX) 20 MG tablet TAKE 1 TABLET (20 MG TOTAL) BY MOUTH DAILY. 90 tablet 12   No current facility-administered medications for this visit.    Facility-Administered Medications Ordered in Other Visits  Medication Dose Route Frequency Provider Last Rate Last Dose  . chlorhexidine (HIBICLENS) 4 %  liquid 1 application  1 application Topical Once Autumn Messing III, MD      . chlorhexidine (HIBICLENS) 4 % liquid 1 application  1 application Topical Once Autumn Messing III, MD        OBJECTIVE: Older white woman  Vitals:   02/05/16 1050  BP: (!) 138/42  Pulse: 77  Resp: 18  Temp: 97.9 F (36.6 C)     Body mass index is 25.99 kg/m.    ECOG FS:0 - Asymptomatic  Sclerae unicteric, EOMs intact Oropharynx clear and moist No cervical or supraclavicular adenopathy Lungs no rales or rhonchi Heart regular rate and rhythm Abd soft, nontender, positive bowel sounds MSK no focal spinal tenderness, no upper extremity lymphedema Neuro: nonfocal, well oriented, appropriate affect Breasts: In the medial aspect of the right breast there is a broad poorly defined area of increased thickening, which is going to be related I believe to radiation-induced scarring. There is no erythema or tenderness associated  with this. There is the usual induration associated with the incision area. I don't see anything really suspicious for disease recurrence. The right axilla is benign. The left breast is unremarkable.  LAB RESULTS:  CMP     Component Value Date/Time   NA 142 02/05/2016 1034   K 5.1 02/05/2016 1034   CL 105 12/15/2015 1632   CO2 27 02/05/2016 1034   GLUCOSE 89 02/05/2016 1034   BUN 21.6 02/05/2016 1034   CREATININE 1.2 (H) 02/05/2016 1034   CALCIUM 9.5 02/05/2016 1034   PROT 6.5 02/05/2016 1034   ALBUMIN 3.2 (L) 02/05/2016 1034   AST 19 02/05/2016 1034   ALT 11 02/05/2016 1034   ALKPHOS 41 02/05/2016 1034   BILITOT 0.34 02/05/2016 1034   GFRNONAA 50.17 10/07/2009 0952   GFRAA 58 05/11/2007 1023    INo results found for: SPEP, UPEP  Lab Results  Component Value Date   WBC 5.5 02/05/2016   NEUTROABS 3.4 02/05/2016   HGB 13.2 02/05/2016   HCT 39.9 02/05/2016   MCV 91.7 02/05/2016   PLT 179 02/05/2016      Chemistry      Component Value Date/Time   NA 142 02/05/2016 1034   K 5.1 02/05/2016 1034   CL 105 12/15/2015 1632   CO2 27 02/05/2016 1034   BUN 21.6 02/05/2016 1034   CREATININE 1.2 (H) 02/05/2016 1034      Component Value Date/Time   CALCIUM 9.5 02/05/2016 1034   ALKPHOS 41 02/05/2016 1034   AST 19 02/05/2016 1034   ALT 11 02/05/2016 1034   BILITOT 0.34 02/05/2016 1034       No results found for: LABCA2  No components found for: LABCA125  No results for input(s): INR in the last 168 hours.  Urinalysis No results found for: COLORURINE, APPEARANCEUR, LABSPEC, PHURINE, GLUCOSEU, HGBUR, BILIRUBINUR, KETONESUR, PROTEINUR, UROBILINOGEN, NITRITE, LEUKOCYTESUR  STUDIES: Study Result   CLINICAL DATA:  75 year old female with history of right breast cancer post lumpectomy 05/30/2014 followed by radiation therapy. Patient reports mass lay thickening adjacent to the lumpectomy site in the slightly inner right breast. Patient also reports right breast  tenderness.  EXAM: 2D DIGITAL DIAGNOSTIC UNILATERAL RIGHT MAMMOGRAM WITH CAD AND ADJUNCT TOMO  RIGHT BREAST ULTRASOUND  COMPARISON:  Previous exam(s).  ACR Breast Density Category c: The breast tissue is heterogeneously dense, which may obscure small masses.  FINDINGS: No suspicious masses or calcifications are seen in the right breast. Postsurgical changes are present in the upper far posterior right breast related  to prior lumpectomy. An initially questioned asymmetry seen in the inner right breast on the CC view only resolves on the additional spot compression CC as well as XCCL tomograms with findings compatible with overlapping tissue. There is no mammographic evidence of malignancy in the right breast.  Mammographic images were processed with CAD.  Physical examination of the inner right breast at site of palpable concern reveals generalized thickening without a discrete palpable mass.  Targeted ultrasound of the entire inner right breast was performed. No discrete masses or abnormalities are seen, only heterogeneous fibroglandular tissue is visualized. There skin thickening as well as subcutaneous edema present consistent with prior right breast surgery and radiation therapy.  IMPRESSION: Postsurgical changes in the right breast. There is no mammographic or sonographic evidence of malignancy in the right breast.  RECOMMENDATION: 1. Recommend further management of the right breast palpable abnormality be based on clinical assessment.  2. Recommend annual diagnostic mammography, due February 2018.  I have discussed the findings and recommendations with the patient. Results were also provided in writing at the conclusion of the visit. If applicable, a reminder letter will be sent to the patient regarding the next appointment.  BI-RADS CATEGORY  2: Benign.   Electronically Signed   By: Everlean Alstrom M.D.   On: 11/11/2015 13:34      ASSESSMENT: 75 y.o. BRCA negative [place] woman status post right breast Upper outer quadrant biopsy 05/03/2014 for a clinical T1b N0, stage IA invasive ductal carcinoma, grade 1 or 2, estrogen receptor positive, progesterone receptor negative, with an MIB-1 of 12%, and HER-2/neu equivocal  (1) right lumpectomy and sentinel lymph node sampling 05/30/2014 showed a pT1c pN0, stage IA invasive ductal carcinoma, grade 1, repeat HER-2 again negative.  (2) genetics testing 05/17/2014 showed no deleterious in ATM, BARD1, BRCA1, BRCA2, BRIP1, CDH1, CHEK2, MRE11A, MUTYH, NBN, NF1, PALB2, PTEN, RAD50, RAD51C, RAD51D, and TP53. Ce[facility] genetics, BreastNext panel)  (3) Oncotype score of 25 predicts a risk of distant recurrence of 16% if the patient's only systemic treatment is tamoxifen for 5 years; it also predicts a benefit from chemotherapy in the 5% range. Given this marginal benefit the patient opted against adjuvant chemotherapy  (4) adjuvant radiation completed 08/12/2014: Right breast 4250 cGy in 17 sessions, right breast boost 1200 cGy in 6 sessions  (5) started tamoxifen 10/10/2014  (a) status post hysterectomy and bilateral salpingo-oophorectomy in 1972  PLAN: [place] is now nearly 2 years out from definitive surgery for her very early stage breast cancer with no evidence of disease recurrence. This is very favorable.  I think what we are feeling in the medial aspect of the right breast is scar tissue from the radiation. This requires only follow-up. She is going to be seeing Dr. [name] later this month and she is going to be having a repeat mammogram in February.  We reviewed her labs which show a slight drop in her renal function. I suggested she make sure to drink at least reported on a half of fluids daily. She is already cutting down on salt. She is not on any medication right now that could hurt her kidneys as far as I can tellt.  She will see me again in one year. She will call  with any problems that may develop before that visit.  Chauncey Cruel, MD   02/05/2016 11:38 AM Medical Oncology and Hematology [facility] [address] [place], [place] [address] Tel. [phone]    Fax. [phone]

## 2016-02-23 DIAGNOSIS — C50411 Malignant neoplasm of upper-outer quadrant of right female breast: Secondary | ICD-10-CM | POA: Diagnosis not present

## 2016-03-11 ENCOUNTER — Other Ambulatory Visit: Payer: Self-pay | Admitting: Family Medicine

## 2016-03-11 NOTE — Telephone Encounter (Signed)
Last OV and last refill 03/04/15 #10 + 11, no f/u scheduled.

## 2016-03-11 NOTE — Telephone Encounter (Signed)
Please schedule a spring f/u and refill until then  thanks

## 2016-03-12 NOTE — Telephone Encounter (Signed)
appt scheduled and med refilled 

## 2016-04-06 ENCOUNTER — Other Ambulatory Visit: Payer: Self-pay | Admitting: General Surgery

## 2016-04-06 DIAGNOSIS — Z853 Personal history of malignant neoplasm of breast: Secondary | ICD-10-CM

## 2016-05-18 ENCOUNTER — Ambulatory Visit (INDEPENDENT_AMBULATORY_CARE_PROVIDER_SITE_OTHER): Payer: Medicare Other | Admitting: Family Medicine

## 2016-05-18 ENCOUNTER — Encounter: Payer: Self-pay | Admitting: Family Medicine

## 2016-05-18 VITALS — BP 122/62 | HR 68 | Temp 98.3°F | Ht 64.0 in | Wt 149.2 lb

## 2016-05-18 DIAGNOSIS — J0100 Acute maxillary sinusitis, unspecified: Secondary | ICD-10-CM

## 2016-05-18 DIAGNOSIS — G43119 Migraine with aura, intractable, without status migrainosus: Secondary | ICD-10-CM | POA: Diagnosis not present

## 2016-05-18 DIAGNOSIS — I10 Essential (primary) hypertension: Secondary | ICD-10-CM

## 2016-05-18 DIAGNOSIS — M797 Fibromyalgia: Secondary | ICD-10-CM

## 2016-05-18 DIAGNOSIS — J019 Acute sinusitis, unspecified: Secondary | ICD-10-CM | POA: Insufficient documentation

## 2016-05-18 DIAGNOSIS — Z170 Estrogen receptor positive status [ER+]: Secondary | ICD-10-CM

## 2016-05-18 DIAGNOSIS — C50411 Malignant neoplasm of upper-outer quadrant of right female breast: Secondary | ICD-10-CM

## 2016-05-18 MED ORDER — AZITHROMYCIN 250 MG PO TABS: ORAL_TABLET | ORAL | 0 refills | Status: DC

## 2016-05-18 MED ORDER — FLUTICASONE PROPIONATE 50 MCG/ACT NA SUSP: NASAL | 11 refills | Status: DC

## 2016-05-18 NOTE — Assessment & Plan Note (Signed)
Overall worse lately in light of sinus infection and weather change  Will tx sinusitis Continue current medications  The gabapentin for hot flashes may also help Re assuring exam

## 2016-05-18 NOTE — Assessment & Plan Note (Signed)
Close f/u oncol and surg  On tamoxifen  For mm soon  Continue to follow

## 2016-05-18 NOTE — Patient Instructions (Addendum)
Take zpak for sinus infection  Steam/nasal saline / fluids/ rest  Don't stop your flonase   Take care of yourself   Follow up in 6 months for annual exam

## 2016-05-18 NOTE — Progress Notes (Signed)
Subjective:    Patient ID: Sarah Young, female    DOB: 20-Apr-1940, 76 y.o.   MRN: [id number]  HPI Here for f/u of chronic health problems   Wt Readings from Last 3 Encounters:  05/18/16 149 lb 4 oz (67.7 kg)  02/05/16 151 lb 6.4 oz (68.7 kg)  12/15/15 153 lb 8 oz (69.6 kg)  bmi is 25.2  bp is stable today  No cp or palpitations or headaches or edema  No side effects to medicines  BP Readings from Last 3 Encounters:  05/18/16 122/62  02/05/16 (!) 138/42  12/15/15 128/74     Breast cancer f/u  Goes back march 1 for another mammogram and then sees the surgeon  On tamoxifen  Has another place they are watching  On gabapentin for hot flashes    Hx of migraine - a lot of problems with the weather change  Also L maxillary pain  Some drainage in am - mucous is grey/green  3 weeks ago- had uri / had a fever then  Teeth hurt on that side also     Last labs in nov    Chemistry      Component Value Date/Time   NA 142 02/05/2016 1034   K 5.1 02/05/2016 1034   CL 105 12/15/2015 1632   CO2 27 02/05/2016 1034   BUN 21.6 02/05/2016 1034   CREATININE 1.2 (H) 02/05/2016 1034      Component Value Date/Time   CALCIUM 9.5 02/05/2016 1034   ALKPHOS 41 02/05/2016 1034   AST 19 02/05/2016 1034   ALT 11 02/05/2016 1034   BILITOT 0.34 02/05/2016 1034     Lab Results  Component Value Date   WBC 5.5 02/05/2016   HGB 13.2 02/05/2016   HCT 39.9 02/05/2016   MCV 91.7 02/05/2016   PLT 179 02/05/2016   Lab Results  Component Value Date   WBC 5.5 02/05/2016   HGB 13.2 02/05/2016   HCT 39.9 02/05/2016   MCV 91.7 02/05/2016   PLT 179 02/05/2016    Lab Results  Component Value Date   TSH 3.08 12/15/2015      Pt mentions she had "spots on her liver" in the past and had ultrasounds  Looks like she has had cysts She will contact her GI to see if they want to re image (and how often)  Patient Active Problem List   Diagnosis Date Noted  . Acute sinusitis 05/18/2016  .  Pedal edema 12/15/2015  . Family history of breast cancer in female 05/17/2014  . Breast cancer of upper-outer quadrant of right female breast ([facility]) 05/08/2014  . GERD (gastroesophageal reflux disease) 03/01/2014  . Lumbar degenerative disc disease 08/15/2013  . Chronic pain of scapula 08/15/2013  . Other malaise and fatigue 10/18/2012  . Shingles 11/02/2011  . BPPV (benign paroxysmal positional vertigo) 09/16/2011  . Cerumen impaction 09/16/2011  . CONTACT DERMATITIS 02/10/2010  . Gastritis and gastroduodenitis 11/24/2009  . Essential hypertension 10/02/2008  . Migraine 09/06/2008  . VITAMIN D DEFICIENCY 06/27/2007  . HYPERLIPIDEMIA 05/11/2007  . ANXIETY 05/11/2007  . Disorder of bone and cartilage 05/11/2007  . POLIOMYELITIS 05/08/2007  . OSTEOARTHRITIS, HANDS, BILATERAL 05/08/2007  . Fibromyalgia 05/08/2007  . URINARY INCONTINENCE, STRESS, MILD 05/08/2007   Past Medical History:  Diagnosis Date  . Anxiety   . Arthritis    OA of hands  . Breast cancer of upper-outer quadrant of right female breast ([facility]) 05/08/2014  . DVT (deep venous thrombosis) ([facility]) 1980s  .  Fibromyalgia   . Fibromyalgia   . Hyperlipidemia   . Irritable bowel syndrome   . Migraine   . Osteopenia   . S/P radiation therapy 07/11/2014 through 08/12/2014    Right breast 4250 cGy in 17 sessions, right breast boost 1200 cGy in 6 sessions   . Vitamin D deficiency   . Wears glasses    Past Surgical History:  Procedure Laterality Date  . ABDOMINAL HYSTERECTOMY  1972  . APPENDECTOMY    . BREAST SURGERY  1995   lumpectomy fibrocystic breast-lt  . CATARACT EXTRACTION     both  . CHOLECYSTECTOMY  2005  . COLONOSCOPY    . ovarian cyst removed  1985  . RADIOACTIVE SEED GUIDED MASTECTOMY WITH AXILLARY SENTINEL LYMPH NODE BIOPSY Right 05/30/2014   Procedure: RADIOACTIVE SEED GUIDED PARTIAL MASTECTOMY WITH AXILLARY SENTINEL LYMPH NODE BIOPSY;   Surgeon: Autumn Messing III, MD;  Location: [facility];  Service: General;  Laterality: Right;   Social History  Substance Use Topics  . Smoking status: Never Smoker  . Smokeless tobacco: Never Used     Comment: non smoker  . Alcohol use No   Family History  Problem Relation Age of Onset  . Hypertension Father   . Cancer Father 38    esophageal - smoker  . Cancer Brother 84    breast CA  . Leukemia Mother   . Cancer Mother 12    leukemia  . Cancer Maternal Aunt 80    leukemia  . Cancer Paternal Aunt 5    breast   Allergies  Allergen Reactions  . Amoxicillin Itching    REACTION: reacts opposite effect made infection worse.  Marland Kitchen Amoxicillin-Pot Clavulanate     REACTION: Felt like body turned inside out  . Esomeprazole Magnesium     REACTION: Nausea  . Omeprazole Nausea And Vomiting    REACTION: not effective  . Ranitidine Hcl     REACTION: not effective   Current Outpatient Prescriptions on File Prior to Visit  Medication Sig Dispense Refill  . amitriptyline (ELAVIL) 10 MG tablet TAKE ONE TABLET BY MOUTH AT BEDTIME. MAY INCREASE BY ONE TABLET WEEKLY AS TOLERATED UP TO MAX OF 5 TABS/NIGHT 90 tablet 5  . calcium carbonate 200 MG capsule Take 250 mg by mouth 2 (two) times daily.    . Cholecalciferol (VITAMIN D PO) Take 1 tablet by mouth 2 (two) times daily. Pt takes 2000 units daily    . fish oil-omega-3 fatty acids 1000 MG capsule Take 1 g by mouth 2 (two) times daily.    Marland Kitchen gabapentin (NEURONTIN) 100 MG capsule TAKE ONE CAPSULE BY MOUTH EVERY NIGHT AT BEDTIME 90 capsule 4  . SUMAtriptan (IMITREX) 100 MG tablet TAKE ONE TABLET AS NEEDED FOR MIGRAINE. MAY REPEAT IN TWO HOURS IF NEEDED *MAX OF 2 TABLETS IN 24 HOURS* 9 tablet 2  . tamoxifen (NOLVADEX) 20 MG tablet TAKE 1 TABLET (20 MG TOTAL) BY MOUTH DAILY. 90 tablet 12   Current Facility-Administered Medications on File Prior to Visit  Medication Dose Route Frequency Provider Last Rate Last Dose  . chlorhexidine  (HIBICLENS) 4 % liquid 1 application  1 application Topical Once Autumn Messing III, MD      . chlorhexidine (HIBICLENS) 4 % liquid 1 application  1 application Topical Once Autumn Messing III, MD        Review of Systems Review of Systems  Constitutional: Negative for fever, appetite change,  and unexpected weight change.  Eyes: Negative  for pain and visual disturbance.  ENT pos for sinus pain and congestion  Respiratory: Negative for cough and shortness of breath.   Cardiovascular: Negative for cp or palpitations    Gastrointestinal: Negative for nausea, diarrhea and constipation.  Genitourinary: Negative for urgency and frequency.  Skin: Negative for pallor or rash   MSK pos for myofascial and joint pain  Neurological: Negative for weakness, light-headedness, numbness and pos for headaches.  Hematological: Negative for adenopathy. Does not bruise/bleed easily.  Psychiatric/Behavioral: Negative for dysphoric mood. The patient is sometimes  nervous/anxious.         Objective:   Physical Exam  Constitutional: She appears well-developed and well-nourished. No distress.  HENT:  Head: Normocephalic and atraumatic.  Right Ear: External ear normal.  Left Ear: External ear normal.  Mouth/Throat: Oropharynx is clear and moist. No oropharyngeal exudate.  Nares are injected and congested  Bilateral maxillary sinus tenderness worse on the L  Post nasal drip   Eyes: Conjunctivae and EOM are normal. Pupils are equal, round, and reactive to light. Right eye exhibits no discharge. Left eye exhibits no discharge.  Neck: Normal range of motion. Neck supple. No JVD present. Carotid bruit is not present. No thyromegaly present.  Cardiovascular: Normal rate, regular rhythm, normal heart sounds and intact distal pulses.  Exam reveals no gallop.   Pulmonary/Chest: Effort normal and breath sounds normal. No respiratory distress. She has no wheezes. She has no rales.  No crackles  Abdominal: Soft. Bowel sounds  are normal. She exhibits no distension, no abdominal bruit and no mass. There is no tenderness.  Musculoskeletal: She exhibits no edema.  OA joint changes in hands bilat   Lymphadenopathy:    She has no cervical adenopathy.  Neurological: She is alert. She has normal reflexes. No cranial nerve deficit.  Skin: Skin is warm and dry. No rash noted.  sks and lentigines  Psychiatric: She has a normal mood and affect.          Assessment & Plan:   Problem List Items Addressed This Visit      Cardiovascular and Mediastinum   Essential hypertension - Primary    bp in fair control at this time  BP Readings from Last 1 Encounters:  05/18/16 122/62   No changes needed Disc lifstyle change with low sodium diet and exercise  F/u 6 mo for annual exam      Migraine    Overall worse lately in light of sinus infection and weather change  Will tx sinusitis Continue current medications  The gabapentin for hot flashes may also help Re assuring exam        Respiratory   Acute sinusitis    In pcn all pt  Worse L maxillary Cover with zpack Hope this will help migraine as well Disc symptomatic care - see instructions on AVS  Update if not starting to improve in a week or if worsening       Relevant Medications   azithromycin (ZITHROMAX Z-PAK) 250 MG tablet   fluticasone (FLONASE) 50 MCG/ACT nasal spray     Other   Breast cancer of upper-outer quadrant of right female breast ([facility])    Close f/u oncol and surg  On tamoxifen  For mm soon  Continue to follow       Relevant Medications   azithromycin (ZITHROMAX Z-PAK) 250 MG tablet   Fibromyalgia    Worse with weather  Continue elavil Stay active  Suspect gabapentin may also help

## 2016-05-18 NOTE — Assessment & Plan Note (Signed)
bp in fair control at this time  BP Readings from Last 1 Encounters:  05/18/16 122/62   No changes needed Disc lifstyle change with low sodium diet and exercise  F/u 6 mo for annual exam

## 2016-05-18 NOTE — Progress Notes (Signed)
Pre visit review using our clinic review tool, if applicable. No additional management support is needed unless otherwise documented below in the visit note. 

## 2016-05-18 NOTE — Assessment & Plan Note (Signed)
In pcn all pt  Worse L maxillary Cover with zpack Hope this will help migraine as well Disc symptomatic care - see instructions on AVS  Update if not starting to improve in a week or if worsening

## 2016-05-18 NOTE — Assessment & Plan Note (Signed)
Worse with weather  Continue elavil Stay active  Suspect gabapentin may also help

## 2016-05-27 ENCOUNTER — Ambulatory Visit
Admission: RE | Admit: 2016-05-27 | Discharge: 2016-05-27 | Disposition: A | Discharge status: Home / Self Care | Payer: Medicare Other | Source: Ambulatory Visit | Attending: General Surgery | Admitting: General Surgery

## 2016-05-27 DIAGNOSIS — Z853 Personal history of malignant neoplasm of breast: Secondary | ICD-10-CM

## 2016-05-27 DIAGNOSIS — R928 Other abnormal and inconclusive findings on diagnostic imaging of breast: Secondary | ICD-10-CM | POA: Diagnosis not present

## 2016-06-08 ENCOUNTER — Other Ambulatory Visit: Payer: Self-pay | Admitting: Internal Medicine

## 2016-06-09 ENCOUNTER — Other Ambulatory Visit: Payer: Self-pay | Admitting: Family Medicine

## 2016-06-10 NOTE — Telephone Encounter (Signed)
Pt had CPE scheduled on 11/19/16, last filled on 03/12/16 #9 tabs with 2 additional refills, please advise

## 2016-06-10 NOTE — Telephone Encounter (Signed)
Please refill times 5 

## 2016-06-10 NOTE — Telephone Encounter (Signed)
done

## 2016-06-16 DIAGNOSIS — L2081 Atopic neurodermatitis: Secondary | ICD-10-CM | POA: Diagnosis not present

## 2016-06-16 DIAGNOSIS — D1801 Hemangioma of skin and subcutaneous tissue: Secondary | ICD-10-CM | POA: Diagnosis not present

## 2016-06-16 DIAGNOSIS — L821 Other seborrheic keratosis: Secondary | ICD-10-CM | POA: Diagnosis not present

## 2016-06-16 DIAGNOSIS — L814 Other melanin hyperpigmentation: Secondary | ICD-10-CM | POA: Diagnosis not present

## 2016-06-16 DIAGNOSIS — D225 Melanocytic nevi of trunk: Secondary | ICD-10-CM | POA: Diagnosis not present

## 2016-07-31 IMAGING — US US BREAST LTD UNI RIGHT INC AXILLA
1 series · 13 of 16 positions shown · non-contrast
Comparison: Prior exams

CLINICAL DATA: Callback screening for a possible right breast mass

EXAM:
DIGITAL DIAGNOSTIC RIGHT MAMMOGRAM WITH CAD
ULTRASOUND RIGHT BREAST

[Series 1: us breast ltd uni right inc axilla · 13 of 16 slices shown]
[im 1/16]
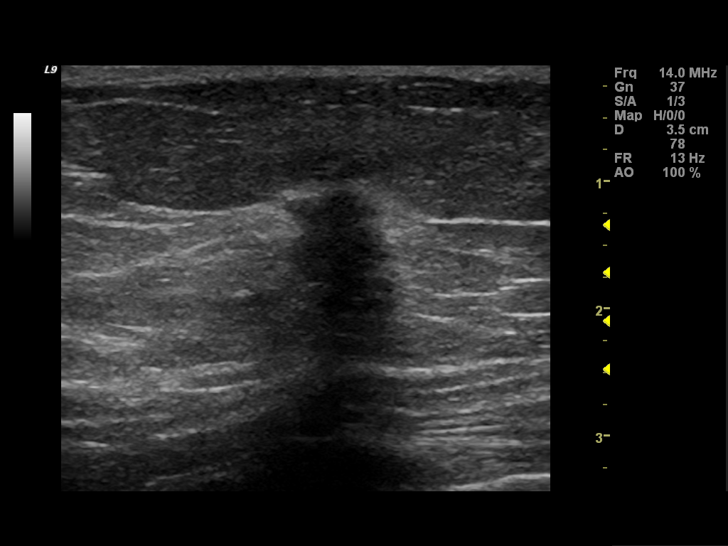
[im 2/16]
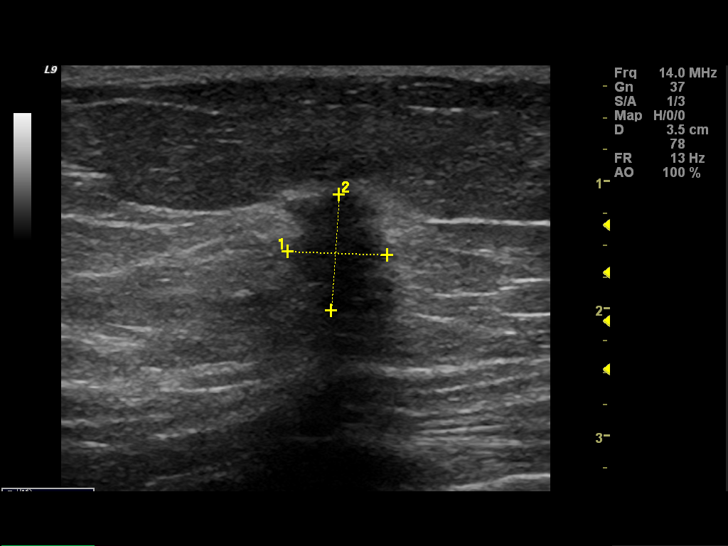
[im 4/16]
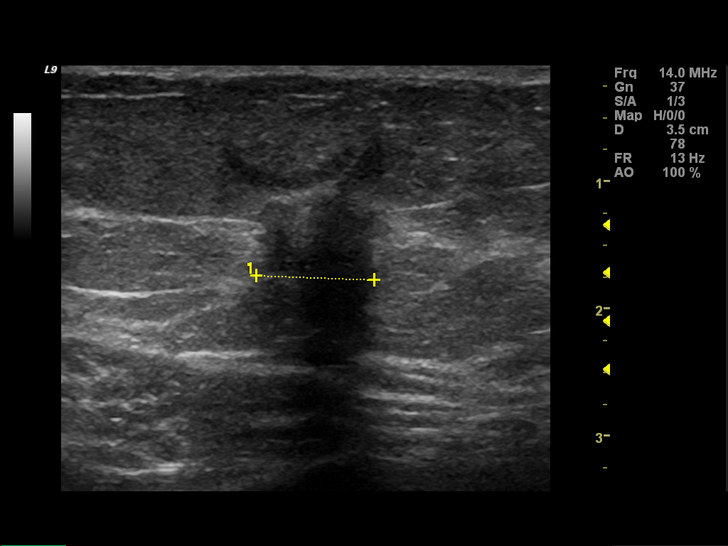
[im 5/16]
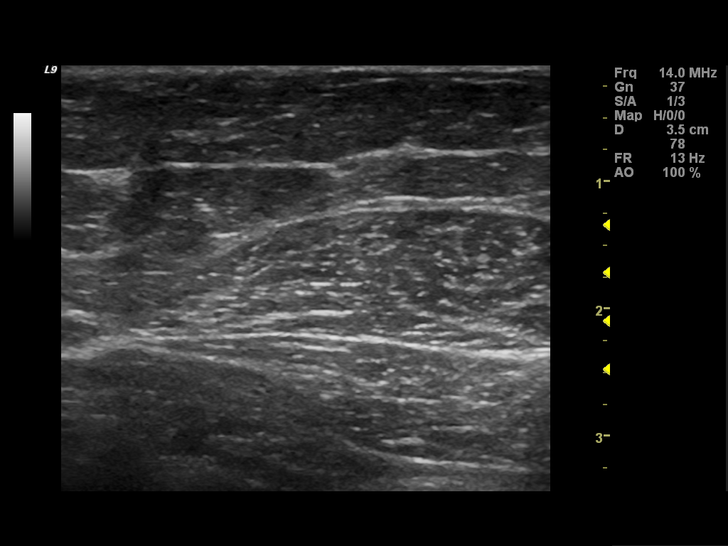
[im 6/16]
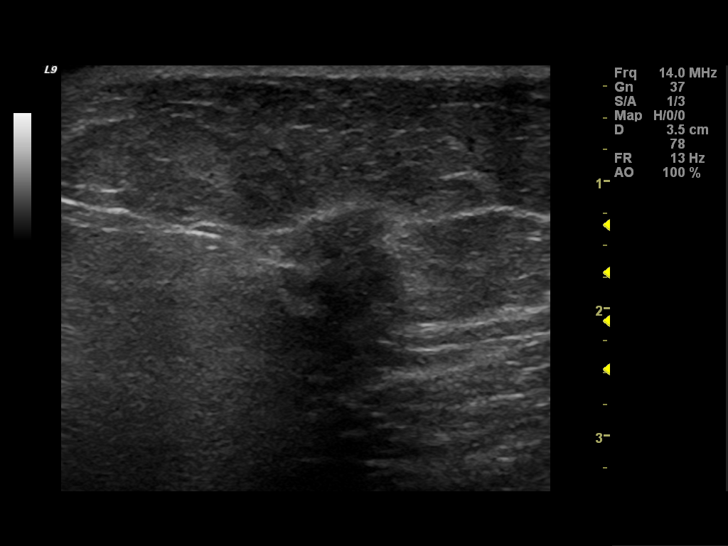
[im 7/16]
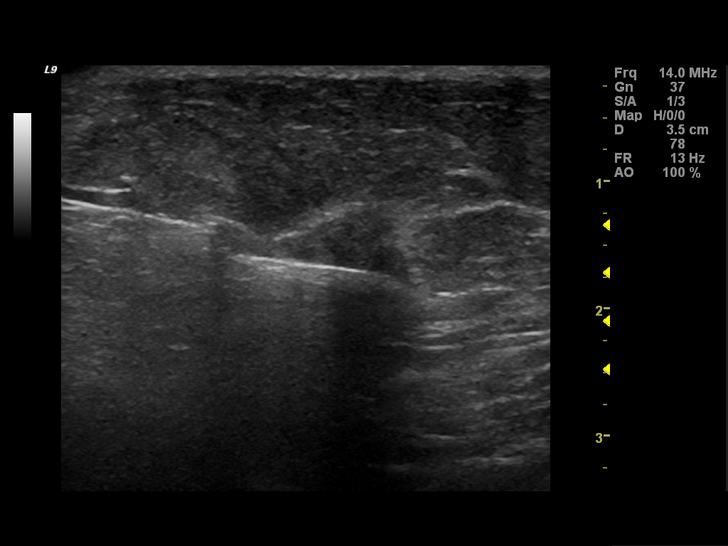
[im 9/16]
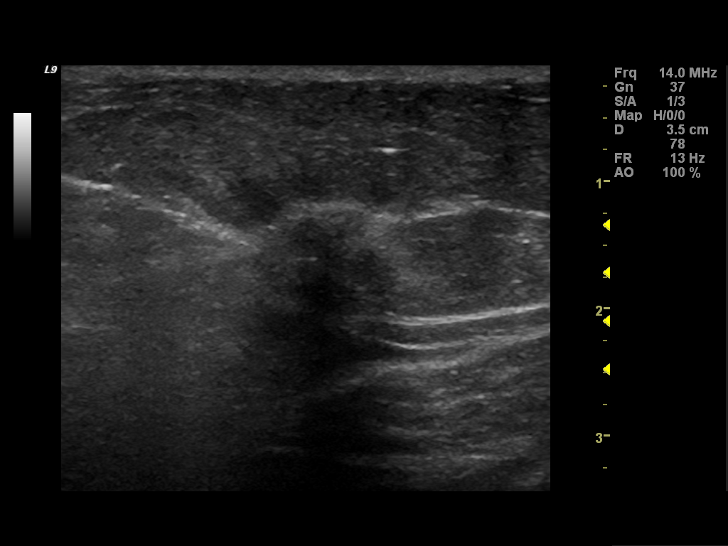
[im 10/16]
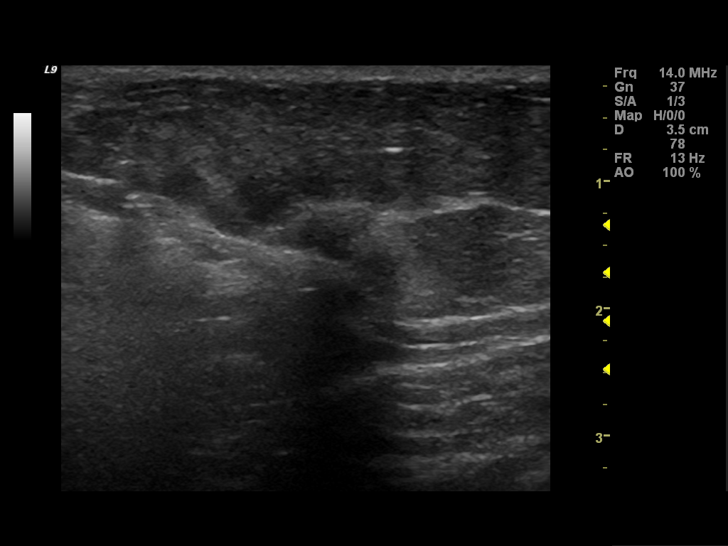
[im 11/16]
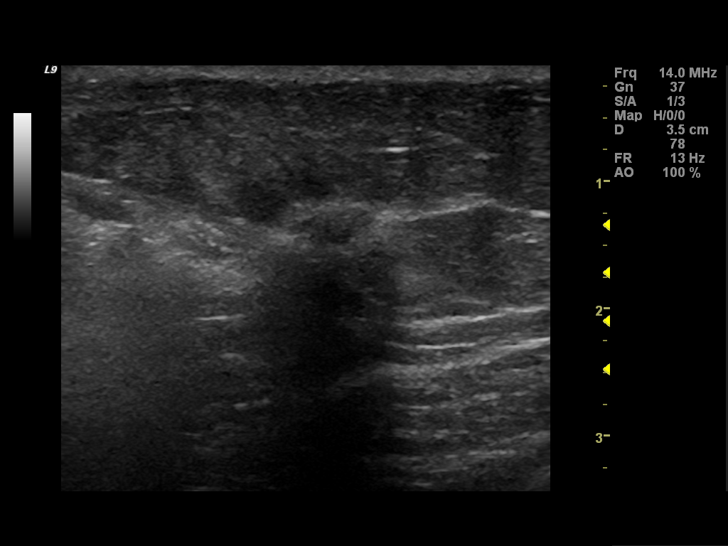
[im 12/16]
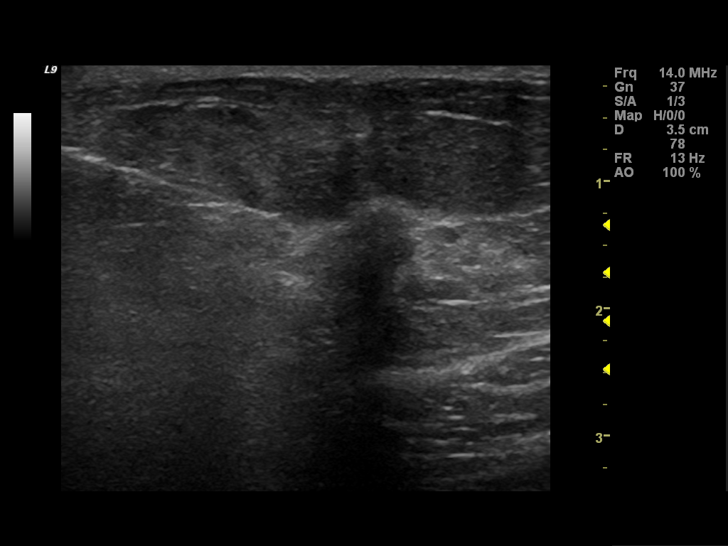
[im 13/16]
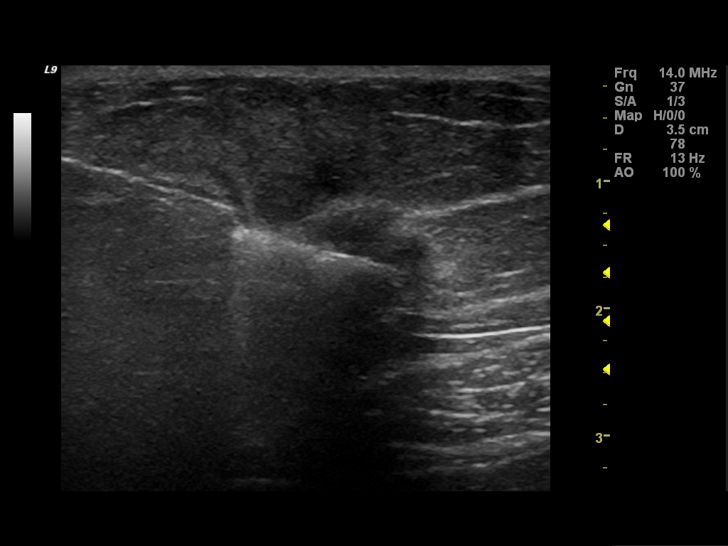
[im 15/16]
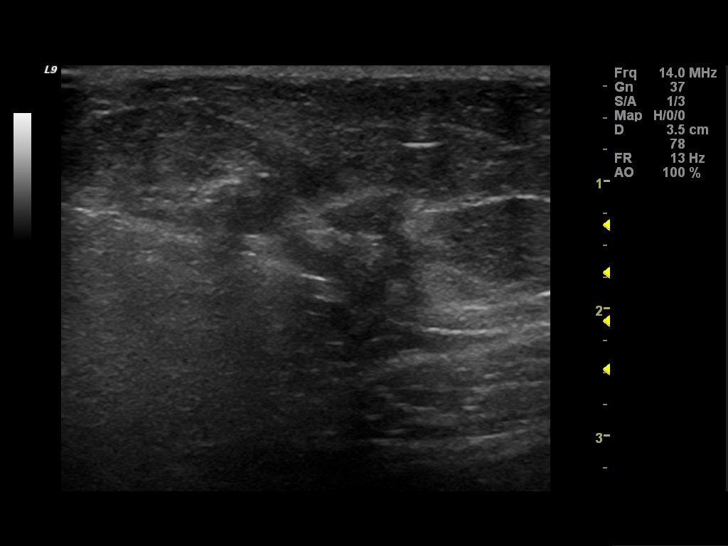
[im 16/16]
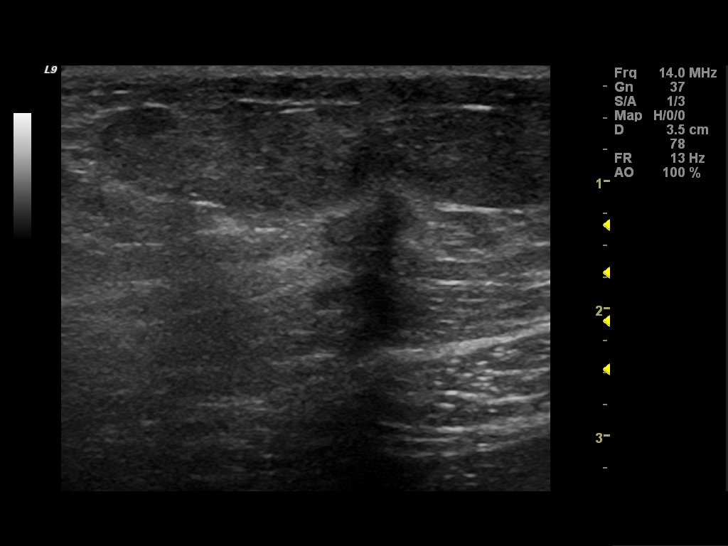

[13 of 16 positions shown; findings below may reference images not displayed]

ACR Breast Density Category b: There are scattered areas of
fibroglandular density.
FINDINGS: Spiculated mass persists in the posterior upper outer right breast,
developing since the prior mammogram. No axillary masses. No other
breast abnormalities.

Mammographic images were processed with CAD.

On physical exam, mild nodularity is noted in the upper right breast
with no discrete mass.

Targeted ultrasound is performed, showing a hypoechoic, irregular
mass with partly ill-defined margins in the 12 o'clock position of
the right breast, 14 cm from the nipple, measuring 8 mm x 9 mm x 9
mm. Ultrasound of the right axilla shows no enlarged or abnormal
lymph nodes.
IMPRESSION: Spiculated mass in the upper right breast highly suspicious for a
breast malignancy. Patient has a history of left breast malignancy
in [REDACTED].

RECOMMENDATION:
Biopsy. Patient will undergo ultrasound-guided core needle biopsy of
this mass today.

I have discussed the findings and recommendations with the patient.
Results were also provided in writing at the conclusion of the
visit. If applicable, a reminder letter will be sent to the patient
regarding the next appointment.

BI-RADS CATEGORY  5: Highly suggestive of malignancy.

## 2016-08-13 DIAGNOSIS — M5416 Radiculopathy, lumbar region: Secondary | ICD-10-CM | POA: Diagnosis not present

## 2016-08-13 DIAGNOSIS — M791 Myalgia: Secondary | ICD-10-CM | POA: Diagnosis not present

## 2016-08-27 DIAGNOSIS — M5416 Radiculopathy, lumbar region: Secondary | ICD-10-CM | POA: Diagnosis not present

## 2016-08-31 DIAGNOSIS — M5416 Radiculopathy, lumbar region: Secondary | ICD-10-CM | POA: Diagnosis not present

## 2016-09-06 DIAGNOSIS — M545 Low back pain: Secondary | ICD-10-CM | POA: Diagnosis not present

## 2016-09-07 ENCOUNTER — Other Ambulatory Visit (HOSPITAL_COMMUNITY): Payer: Self-pay | Admitting: Physical Medicine and Rehabilitation

## 2016-09-07 DIAGNOSIS — M5416 Radiculopathy, lumbar region: Secondary | ICD-10-CM

## 2016-09-08 DIAGNOSIS — M47816 Spondylosis without myelopathy or radiculopathy, lumbar region: Secondary | ICD-10-CM | POA: Diagnosis not present

## 2016-09-10 ENCOUNTER — Encounter (HOSPITAL_COMMUNITY)
Admission: RE | Admit: 2016-09-10 | Discharge: 2016-09-10 | Disposition: A | Discharge status: Home / Self Care | Payer: Medicare Other | Source: Ambulatory Visit | Attending: Physical Medicine and Rehabilitation | Admitting: Physical Medicine and Rehabilitation

## 2016-09-10 DIAGNOSIS — M5416 Radiculopathy, lumbar region: Secondary | ICD-10-CM | POA: Diagnosis not present

## 2016-09-10 DIAGNOSIS — M1611 Unilateral primary osteoarthritis, right hip: Secondary | ICD-10-CM | POA: Diagnosis not present

## 2016-09-10 DIAGNOSIS — M1612 Unilateral primary osteoarthritis, left hip: Secondary | ICD-10-CM | POA: Diagnosis not present

## 2016-09-10 MED ORDER — TECHNETIUM TC 99M MEDRONATE IV KIT
21.7000 | PACK | Freq: Once | INTRAVENOUS | Status: AC | PRN
  Administered 2016-09-10: 21.7 via INTRAVENOUS

## 2016-10-05 DIAGNOSIS — M47816 Spondylosis without myelopathy or radiculopathy, lumbar region: Secondary | ICD-10-CM | POA: Diagnosis not present

## 2016-10-06 DIAGNOSIS — C50411 Malignant neoplasm of upper-outer quadrant of right female breast: Secondary | ICD-10-CM | POA: Diagnosis not present

## 2016-10-27 DIAGNOSIS — M47816 Spondylosis without myelopathy or radiculopathy, lumbar region: Secondary | ICD-10-CM | POA: Diagnosis not present

## 2016-11-11 ENCOUNTER — Telehealth: Payer: Self-pay | Admitting: Family Medicine

## 2016-11-11 DIAGNOSIS — E782 Mixed hyperlipidemia: Secondary | ICD-10-CM

## 2016-11-11 DIAGNOSIS — E559 Vitamin D deficiency, unspecified: Secondary | ICD-10-CM

## 2016-11-11 DIAGNOSIS — I10 Essential (primary) hypertension: Secondary | ICD-10-CM

## 2016-11-11 NOTE — Telephone Encounter (Signed)
Due to Lesia out of office, please enter [facility] labs. Pt is coming in tomorrow 11/12/16. Thanks!  °

## 2016-11-11 NOTE — Telephone Encounter (Signed)
-----   Message from Eustace Pen, LPN sent at 03/30/5850  5:43 PM EDT ----- Regarding: Labs 8/17 [facility]  Please place lab orders

## 2016-11-11 NOTE — Telephone Encounter (Signed)
This has already been done. Sorry!  °

## 2016-11-12 ENCOUNTER — Other Ambulatory Visit (INDEPENDENT_AMBULATORY_CARE_PROVIDER_SITE_OTHER): Payer: Medicare Other

## 2016-11-12 ENCOUNTER — Ambulatory Visit: Payer: Medicare Other

## 2016-11-12 DIAGNOSIS — E782 Mixed hyperlipidemia: Secondary | ICD-10-CM

## 2016-11-12 DIAGNOSIS — I10 Essential (primary) hypertension: Secondary | ICD-10-CM

## 2016-11-12 DIAGNOSIS — E559 Vitamin D deficiency, unspecified: Secondary | ICD-10-CM

## 2016-11-12 LAB — CBC WITH DIFFERENTIAL/PLATELET
Basophils Absolute: 0.1 K/uL (ref 0.0–0.1)
Basophils Relative: 1.4 % (ref 0.0–3.0)
Eosinophils Absolute: 0 K/uL (ref 0.0–0.7)
Eosinophils Relative: 0.6 % (ref 0.0–5.0)
HCT: 41.3 % (ref 36.0–46.0)
Hemoglobin: 13.3 g/dL (ref 12.0–15.0)
Lymphocytes Relative: 26.6 % (ref 12.0–46.0)
Lymphs Abs: 1.5 K/uL (ref 0.7–4.0)
MCHC: 32.3 g/dL (ref 30.0–36.0)
MCV: 93.3 fl (ref 78.0–100.0)
Monocytes Absolute: 0.4 K/uL (ref 0.1–1.0)
Monocytes Relative: 7.6 % (ref 3.0–12.0)
Neutro Abs: 3.6 K/uL (ref 1.4–7.7)
Neutrophils Relative %: 63.8 % (ref 43.0–77.0)
Platelets: 191 K/uL (ref 150.0–400.0)
RBC: 4.42 Mil/uL (ref 3.87–5.11)
RDW: 14.1 % (ref 11.5–15.5)
WBC: 5.7 K/uL (ref 4.0–10.5)

## 2016-11-12 LAB — COMPREHENSIVE METABOLIC PANEL WITH GFR
ALT: 11 U/L (ref 0–35)
AST: 19 U/L (ref 0–37)
Albumin: 3.4 g/dL — ABNORMAL LOW (ref 3.5–5.2)
Alkaline Phosphatase: 31 U/L — ABNORMAL LOW (ref 39–117)
BUN: 21 mg/dL (ref 6–23)
CO2: 29 meq/L (ref 19–32)
Calcium: 9 mg/dL (ref 8.4–10.5)
Chloride: 107 meq/L (ref 96–112)
Creatinine, Ser: 1.36 mg/dL — ABNORMAL HIGH (ref 0.40–1.20)
GFR: 40.12 mL/min — ABNORMAL LOW
Glucose, Bld: 91 mg/dL (ref 70–99)
Potassium: 4.6 meq/L (ref 3.5–5.1)
Sodium: 142 meq/L (ref 135–145)
Total Bilirubin: 0.4 mg/dL (ref 0.2–1.2)
Total Protein: 5.7 g/dL — ABNORMAL LOW (ref 6.0–8.3)

## 2016-11-12 LAB — LIPID PANEL
Cholesterol: 193 mg/dL (ref 0–200)
HDL: 68.8 mg/dL
LDL Cholesterol: 102 mg/dL — ABNORMAL HIGH (ref 0–99)
NonHDL: 123.95
Total CHOL/HDL Ratio: 3
Triglycerides: 108 mg/dL (ref 0.0–149.0)
VLDL: 21.6 mg/dL (ref 0.0–40.0)

## 2016-11-12 LAB — VITAMIN D 25 HYDROXY (VIT D DEFICIENCY, FRACTURES): VITD: 30.06 ng/mL (ref 30.00–100.00)

## 2016-11-12 LAB — TSH: TSH: 3.46 u[IU]/mL (ref 0.35–4.50)

## 2016-11-19 ENCOUNTER — Encounter: Payer: Self-pay | Admitting: Family Medicine

## 2016-11-19 ENCOUNTER — Ambulatory Visit (INDEPENDENT_AMBULATORY_CARE_PROVIDER_SITE_OTHER): Payer: Medicare Other | Admitting: Family Medicine

## 2016-11-19 VITALS — BP 116/62 | HR 73 | Temp 97.3°F | Resp 16 | Ht 64.5 in | Wt 150.0 lb

## 2016-11-19 DIAGNOSIS — M899 Disorder of bone, unspecified: Secondary | ICD-10-CM

## 2016-11-19 DIAGNOSIS — E782 Mixed hyperlipidemia: Secondary | ICD-10-CM | POA: Diagnosis not present

## 2016-11-19 DIAGNOSIS — Z170 Estrogen receptor positive status [ER+]: Secondary | ICD-10-CM | POA: Diagnosis not present

## 2016-11-19 DIAGNOSIS — G43119 Migraine with aura, intractable, without status migrainosus: Secondary | ICD-10-CM

## 2016-11-19 DIAGNOSIS — I10 Essential (primary) hypertension: Secondary | ICD-10-CM | POA: Diagnosis not present

## 2016-11-19 DIAGNOSIS — M949 Disorder of cartilage, unspecified: Secondary | ICD-10-CM

## 2016-11-19 DIAGNOSIS — C50411 Malignant neoplasm of upper-outer quadrant of right female breast: Secondary | ICD-10-CM | POA: Diagnosis not present

## 2016-11-19 DIAGNOSIS — Z0000 Encounter for general adult medical examination without abnormal findings: Secondary | ICD-10-CM | POA: Diagnosis not present

## 2016-11-19 DIAGNOSIS — E559 Vitamin D deficiency, unspecified: Secondary | ICD-10-CM | POA: Diagnosis not present

## 2016-11-19 DIAGNOSIS — E2839 Other primary ovarian failure: Secondary | ICD-10-CM | POA: Insufficient documentation

## 2016-11-19 DIAGNOSIS — N289 Disorder of kidney and ureter, unspecified: Secondary | ICD-10-CM | POA: Insufficient documentation

## 2016-11-19 MED ORDER — SUMATRIPTAN SUCCINATE 100 MG PO TABS: ORAL_TABLET | ORAL | 11 refills | Status: DC

## 2016-11-19 MED ORDER — AMITRIPTYLINE HCL 10 MG PO TABS: ORAL_TABLET | ORAL | 3 refills | Status: DC

## 2016-11-19 MED ORDER — FLUTICASONE PROPIONATE 50 MCG/ACT NA SUSP: NASAL | 11 refills | Status: DC

## 2016-11-19 NOTE — Assessment & Plan Note (Signed)
D level of 30 (low nl range) Disc imp to bone and overall health She will inc sup to 2000 iu daily

## 2016-11-19 NOTE — Assessment & Plan Note (Signed)
Doing well  Sees surgeon for exams oncol in fall  On tamoxifen

## 2016-11-19 NOTE — Assessment & Plan Note (Signed)
Lab Results  Component Value Date   CREATININE 1.36 (H) 11/12/2016   Watching this  Stressed imp of more fluids (64 oz daily mostly water) She will work on this  Also avoid nsaids (she already does)

## 2016-11-19 NOTE — Assessment & Plan Note (Signed)
dexa planned On ca and D Hx of breast cancer  On tamoxifen No falls or fx Walks for exercise

## 2016-11-19 NOTE — Assessment & Plan Note (Signed)
bp in fair control at this time  BP Readings from Last 1 Encounters:  11/19/16 116/62   No changes needed-doing fine with lifestyle change (no meds) at this time  Disc lifstyle change with low sodium diet and exercise

## 2016-11-19 NOTE — Assessment & Plan Note (Signed)
Disc goals for lipids and reasons to control them Rev labs with pt Rev low sat fat diet in detail Good control with diet currently

## 2016-11-19 NOTE — Assessment & Plan Note (Signed)
Fairly stable Refilled imitrex

## 2016-11-19 NOTE — Patient Instructions (Addendum)
Don't forget your flu shot in the fall   Stop at check out for bone density test referral   Look into getting a tetanus shot at a pharmacy since medicare does not pay for it in the office   Try to get 2000 iu of vitamin D3 otc daily   You are due for a 10 year colonoscopy 3/19 -don't forget to schedule it   Your kidney number is up  Increase your water  Try to get 64 oz of fluids per day (mostly water)

## 2016-11-19 NOTE — Progress Notes (Signed)
Subjective:    Patient ID: Sarah Young, female    DOB: 1940-04-27, 76 y.o.   MRN: [id number]  HPI Here for health maintenance exam and to review chronic medical problems    Doing ok overall   Hurt her back a few months ago (pain goes down her leg)  Had a shot from [facility]  MRI - showed arthritis    Wt Readings from Last 3 Encounters:  11/19/16 150 lb (68 kg)  05/18/16 149 lb 4 oz (67.7 kg)  02/05/16 151 lb 6.4 oz (68.7 kg)   25.35 kg/m Stable   dexa -last was "a long time ago" - and it was nl  On tamoxifen  She takes ca and D  Walking for her back  No falls or fractures   Tetanus shot - due   Flu shot - will get in the fall   Mammogram 3/18 nl  Was tx for breast cancer on the R (surgery an dradiation)  Self breast exam - no lumps she can tell  Sees the surgeon for exams (has some scar tissue that gets sore) -watching closely  Seeing oncology in the fall  Strong family hx of breast cancer  On tamoxifen   Vit D level is 30.0 Takes 500 iu daily   Completed pna vaccines   Colonoscopy nl 3/09  Due 3/19    bp is stable today  No cp or palpitations or headaches or edema  No side effects to medicines  BP Readings from Last 3 Encounters:  11/19/16 116/62  05/18/16 122/62  02/05/16 (!) 138/42     Hyperlipidemia Lab Results  Component Value Date   CHOL 193 11/12/2016   CHOL 209 (H) 03/04/2015   CHOL 242 (H) 04/20/2013   Lab Results  Component Value Date   HDL 68.80 11/12/2016   HDL 71.60 03/04/2015   HDL 86.40 04/20/2013   Lab Results  Component Value Date   LDLCALC 102 (H) 11/12/2016   LDLCALC 125 (H) 03/04/2015   Lab Results  Component Value Date   TRIG 108.0 11/12/2016   TRIG 63.0 03/04/2015   TRIG 79.0 04/20/2013   Lab Results  Component Value Date   CHOLHDL 3 11/12/2016   CHOLHDL 3 03/04/2015   CHOLHDL 3 04/20/2013   Lab Results  Component Value Date   LDLDIRECT 143.9 04/20/2013   LDLDIRECT 125.7 10/07/2009   LDLDIRECT  133.9 04/04/2009   In good control  She eats well overall/ not a lot of fatty foods    Results for orders placed or performed in visit on 11/12/16  CBC with Differential/Platelet  Result Value Ref Range   WBC 5.7 4.0 - 10.5 K/uL   RBC 4.42 3.87 - 5.11 Mil/uL   Hemoglobin 13.3 12.0 - 15.0 g/dL   HCT 41.3 36.0 - 46.0 %   MCV 93.3 78.0 - 100.0 fl   MCHC 32.3 30.0 - 36.0 g/dL   RDW 14.1 11.5 - 15.5 %   Platelets 191.0 150.0 - 400.0 K/uL   Neutrophils Relative % 63.8 43.0 - 77.0 %   Lymphocytes Relative 26.6 12.0 - 46.0 %   Monocytes Relative 7.6 3.0 - 12.0 %   Eosinophils Relative 0.6 0.0 - 5.0 %   Basophils Relative 1.4 0.0 - 3.0 %   Neutro Abs 3.6 1.4 - 7.7 K/uL   Lymphs Abs 1.5 0.7 - 4.0 K/uL   Monocytes Absolute 0.4 0.1 - 1.0 K/uL   Eosinophils Absolute 0.0 0.0 - 0.7 K/uL  Basophils Absolute 0.1 0.0 - 0.1 K/uL  Comprehensive metabolic panel  Result Value Ref Range   Sodium 142 135 - 145 mEq/L   Potassium 4.6 3.5 - 5.1 mEq/L   Chloride 107 96 - 112 mEq/L   CO2 29 19 - 32 mEq/L   Glucose, Bld 91 70 - 99 mg/dL   BUN 21 6 - 23 mg/dL   Creatinine, Ser 1.36 (H) 0.40 - 1.20 mg/dL   Total Bilirubin 0.4 0.2 - 1.2 mg/dL   Alkaline Phosphatase 31 (L) 39 - 117 U/L   AST 19 0 - 37 U/L   ALT 11 0 - 35 U/L   Total Protein 5.7 (L) 6.0 - 8.3 g/dL   Albumin 3.4 (L) 3.5 - 5.2 g/dL   Calcium 9.0 8.4 - 10.5 mg/dL   GFR 40.12 (L) >60.00 mL/min  Lipid panel  Result Value Ref Range   Cholesterol 193 0 - 200 mg/dL   Triglycerides 108.0 0.0 - 149.0 mg/dL   HDL 68.80 >39.00 mg/dL   VLDL 21.6 0.0 - 40.0 mg/dL   LDL Cholesterol 102 (H) 0 - 99 mg/dL   Total CHOL/HDL Ratio 3    NonHDL 123.95   TSH  Result Value Ref Range   TSH 3.46 0.35 - 4.50 uIU/mL  VITAMIN D 25 Hydroxy (Vit-D Deficiency, Fractures)  Result Value Ref Range   VITD 30.06 30.00 - 100.00 ng/mL    Eats chicken and fish  Tries to get protein with every meal   Cr is up  Tries to drink water - needs to do much better with  that   Patient Active Problem List   Diagnosis Date Noted  . Estrogen deficiency 11/19/2016  . Renal insufficiency 11/19/2016  . Pedal edema 12/15/2015  . Family history of breast cancer in female 05/17/2014  . Breast cancer of upper-outer quadrant of right female breast ([place]) 05/08/2014  . GERD (gastroesophageal reflux disease) 03/01/2014  . Lumbar degenerative disc disease 08/15/2013  . Chronic pain of scapula 08/15/2013  . Essential hypertension 10/02/2008  . Migraine 09/06/2008  . Vitamin D deficiency 06/27/2007  . HYPERLIPIDEMIA 05/11/2007  . ANXIETY 05/11/2007  . Disorder of bone and cartilage 05/11/2007  . H/O poliomyelitis 05/08/2007  . OSTEOARTHRITIS, HANDS, BILATERAL 05/08/2007  . Fibromyalgia 05/08/2007  . URINARY INCONTINENCE, STRESS, MILD 05/08/2007   Past Medical History:  Diagnosis Date  . Anxiety   . Arthritis    OA of hands  . Breast cancer of upper-outer quadrant of right female breast ([place]) 05/08/2014  . DVT (deep venous thrombosis) ([place]) 1980s  . Fibromyalgia   . Fibromyalgia   . Hyperlipidemia   . Irritable bowel syndrome   . Migraine   . Osteopenia   . S/P radiation therapy 07/11/2014 through 08/12/2014    Right breast 4250 cGy in 17 sessions, right breast boost 1200 cGy in 6 sessions   . Vitamin D deficiency   . Wears glasses    Past Surgical History:  Procedure Laterality Date  . ABDOMINAL HYSTERECTOMY  1972  . APPENDECTOMY    . BREAST SURGERY  1995   lumpectomy fibrocystic breast-lt  . CATARACT EXTRACTION     both  . CHOLECYSTECTOMY  2005  . COLONOSCOPY    . ovarian cyst removed  1985  . RADIOACTIVE SEED GUIDED MASTECTOMY WITH AXILLARY SENTINEL LYMPH NODE BIOPSY Right 05/30/2014   Procedure: RADIOACTIVE SEED GUIDED PARTIAL MASTECTOMY WITH AXILLARY SENTINEL LYMPH NODE BIOPSY;  Surgeon: Autumn Messing III, MD;  Location: [facility];  Service: General;  Laterality:  Right;   Social History  Substance Use Topics  . Smoking status: Never Smoker  . Smokeless tobacco: Never Used     Comment: non smoker  . Alcohol use No   Family History  Problem Relation Age of Onset  . Hypertension Father   . Cancer Father 88       esophageal - smoker  . Leukemia Mother   . Cancer Mother 75       leukemia  . Cancer Brother 100       breast CA  . Cancer Maternal Aunt 80       leukemia  . Cancer Paternal Aunt 33       breast   Allergies  Allergen Reactions  . Amoxicillin Itching    REACTION: reacts opposite effect made infection worse.  Marland Kitchen Amoxicillin-Pot Clavulanate     REACTION: Felt like body turned inside out  . Esomeprazole Magnesium     REACTION: Nausea  . Omeprazole Nausea And Vomiting    REACTION: not effective  . Ranitidine Hcl     REACTION: not effective   Current Outpatient Prescriptions on File Prior to Visit  Medication Sig Dispense Refill  . calcium carbonate 200 MG capsule Take 250 mg by mouth 2 (two) times daily.    . Cholecalciferol (VITAMIN D PO) Take 1 tablet by mouth 2 (two) times daily. Pt takes 2000 units daily    . fish oil-omega-3 fatty acids 1000 MG capsule Take 1 g by mouth 2 (two) times daily.    Marland Kitchen gabapentin (NEURONTIN) 100 MG capsule TAKE ONE CAPSULE BY MOUTH EVERY NIGHT AT BEDTIME 90 capsule 4  . tamoxifen (NOLVADEX) 20 MG tablet TAKE 1 TABLET (20 MG TOTAL) BY MOUTH DAILY. 90 tablet 12   No current facility-administered medications on file prior to visit.     Review of Systems Review of Systems  Constitutional: Negative for fever, appetite change, fatigue and unexpected weight change.  Eyes: Negative for pain and visual disturbance.  Respiratory: Negative for cough and shortness of breath.   Cardiovascular: Negative for cp or palpitations    Gastrointestinal: Negative for nausea, diarrhea and constipation.  Genitourinary: Negative for urgency and frequency.  Skin: Negative for pallor or rash   MSK pos for back pain    Neurological: Negative for weakness, light-headedness, numbness and headaches.  Hematological: Negative for adenopathy. Does not bruise/bleed easily.  Psychiatric/Behavioral: Negative for dysphoric mood. The patient is not nervous/anxious.         Objective:   Physical Exam  Constitutional: She appears well-developed and well-nourished. No distress.  Well appearing   HENT:  Head: Normocephalic and atraumatic.  Right Ear: External ear normal.  Left Ear: External ear normal.  Nose: Nose normal.  Mouth/Throat: Oropharynx is clear and moist.  Eyes: Pupils are equal, round, and reactive to light. Conjunctivae and EOM are normal. Right eye exhibits no discharge. Left eye exhibits no discharge. No scleral icterus.  Neck: Normal range of motion. Neck supple. No JVD present. Carotid bruit is not present. No thyromegaly present.  Cardiovascular: Normal rate, regular rhythm, normal heart sounds and intact distal pulses.  Exam reveals no gallop.   Pulmonary/Chest: Effort normal and breath sounds normal. No respiratory distress. She has no wheezes. She has no rales.  Abdominal: Soft. Bowel sounds are normal. She exhibits no distension and no mass. There is no tenderness.  Genitourinary:  Genitourinary Comments: Pt has breast exam from her surgeon  Musculoskeletal: She exhibits no  edema or tenderness.  Limited rom of LS   No kyphosis   Lymphadenopathy:    She has no cervical adenopathy.  Neurological: She is alert. She has normal reflexes. No cranial nerve deficit. She exhibits normal muscle tone. Coordination normal.  Skin: Skin is warm and dry. No rash noted. No erythema. No pallor.  Solar lentigines diffusely  Some solar aging   Psychiatric: She has a normal mood and affect.          Assessment & Plan:   Problem List Items Addressed This Visit      Cardiovascular and Mediastinum   Essential hypertension - Primary    bp in fair control at this time  BP Readings from Last 1  Encounters:  11/19/16 116/62   No changes needed-doing fine with lifestyle change (no meds) at this time  Disc lifstyle change with low sodium diet and exercise        Migraine    Fairly stable Refilled imitrex       Relevant Medications   SUMAtriptan (IMITREX) 100 MG tablet   amitriptyline (ELAVIL) 10 MG tablet     Musculoskeletal and Integument   Disorder of bone and cartilage    dexa planned On ca and D Hx of breast cancer  On tamoxifen No falls or fx Walks for exercise         Genitourinary   Renal insufficiency    Lab Results  Component Value Date   CREATININE 1.36 (H) 11/12/2016   Watching this  Stressed imp of more fluids (64 oz daily mostly water) She will work on this  Also avoid nsaids (she already does)         Other   Breast cancer of upper-outer quadrant of right female breast ([place])    Doing well  Sees surgeon for exams oncol in fall  On tamoxifen        Estrogen deficiency   Relevant Orders   DG Bone Density   HYPERLIPIDEMIA    Disc goals for lipids and reasons to control them Rev labs with pt Rev low sat fat diet in detail Good control with diet currently       Routine general medical examination at a health care facility    Reviewed health habits including diet and exercise and skin cancer prevention Reviewed appropriate screening tests for age  Also reviewed health mt list, fam hx and immunization status , as well as social and family history   See HPI Labs reviewed dexa ordered  Continues f/u for breast cancer Colonoscopy due 3/19 - she is aware  She will get flu shot in the fall        Vitamin D deficiency    D level of 30 (low nl range) Disc imp to bone and overall health She will inc sup to 2000 iu daily

## 2016-11-21 DIAGNOSIS — Z0000 Encounter for general adult medical examination without abnormal findings: Secondary | ICD-10-CM | POA: Insufficient documentation

## 2016-11-21 NOTE — Assessment & Plan Note (Signed)
Reviewed health habits including diet and exercise and skin cancer prevention Reviewed appropriate screening tests for age  Also reviewed health mt list, fam hx and immunization status , as well as social and family history   See HPI Labs reviewed dexa ordered  Continues f/u for breast cancer Colonoscopy due 3/19 - she is aware  She will get flu shot in the fall

## 2016-12-27 ENCOUNTER — Ambulatory Visit
Admission: RE | Admit: 2016-12-27 | Discharge: 2016-12-27 | Disposition: A | Discharge status: Home / Self Care | Payer: Medicare Other | Source: Ambulatory Visit | Attending: Family Medicine | Admitting: Family Medicine

## 2016-12-27 DIAGNOSIS — M8589 Other specified disorders of bone density and structure, multiple sites: Secondary | ICD-10-CM | POA: Diagnosis not present

## 2016-12-27 DIAGNOSIS — Z780 Asymptomatic menopausal state: Secondary | ICD-10-CM | POA: Diagnosis not present

## 2016-12-27 DIAGNOSIS — E2839 Other primary ovarian failure: Secondary | ICD-10-CM

## 2017-01-04 ENCOUNTER — Emergency Department (HOSPITAL_BASED_OUTPATIENT_CLINIC_OR_DEPARTMENT_OTHER)
Admission: EM | Admit: 2017-01-04 | Discharge: 2017-01-04 | Disposition: A | Discharge status: Home / Self Care | Payer: Medicare Other | Attending: Emergency Medicine | Admitting: Emergency Medicine

## 2017-01-04 ENCOUNTER — Telehealth: Payer: Self-pay | Admitting: Family Medicine

## 2017-01-04 ENCOUNTER — Emergency Department (HOSPITAL_BASED_OUTPATIENT_CLINIC_OR_DEPARTMENT_OTHER): Payer: Medicare Other

## 2017-01-04 ENCOUNTER — Encounter (HOSPITAL_BASED_OUTPATIENT_CLINIC_OR_DEPARTMENT_OTHER): Payer: Self-pay | Admitting: Emergency Medicine

## 2017-01-04 DIAGNOSIS — R410 Disorientation, unspecified: Secondary | ICD-10-CM | POA: Insufficient documentation

## 2017-01-04 DIAGNOSIS — I10 Essential (primary) hypertension: Secondary | ICD-10-CM | POA: Insufficient documentation

## 2017-01-04 DIAGNOSIS — Z79899 Other long term (current) drug therapy: Secondary | ICD-10-CM | POA: Diagnosis not present

## 2017-01-04 DIAGNOSIS — Z853 Personal history of malignant neoplasm of breast: Secondary | ICD-10-CM | POA: Insufficient documentation

## 2017-01-04 DIAGNOSIS — R4182 Altered mental status, unspecified: Secondary | ICD-10-CM | POA: Diagnosis present

## 2017-01-04 LAB — COMPREHENSIVE METABOLIC PANEL
ALT: 16 U/L (ref 14–54)
AST: 22 U/L (ref 15–41)
Albumin: 3.7 g/dL (ref 3.5–5.0)
Alkaline Phosphatase: 39 U/L (ref 38–126)
Anion gap: 7 (ref 5–15)
BUN: 19 mg/dL (ref 6–20)
CHLORIDE: 105 mmol/L (ref 101–111)
CO2: 27 mmol/L (ref 22–32)
CREATININE: 1.18 mg/dL — AB (ref 0.44–1.00)
Calcium: 9 mg/dL (ref 8.9–10.3)
GFR calc non Af Amer: 44 mL/min — ABNORMAL LOW (ref >60)
GFR, EST AFRICAN AMERICAN: 51 mL/min — AB (ref >60)
Glucose, Bld: 100 mg/dL — ABNORMAL HIGH (ref 65–99)
Potassium: 3.9 mmol/L (ref 3.5–5.1)
SODIUM: 139 mmol/L (ref 135–145)
Total Bilirubin: 0.4 mg/dL (ref 0.3–1.2)
Total Protein: 6.6 g/dL (ref 6.5–8.1)

## 2017-01-04 LAB — URINALYSIS, ROUTINE W REFLEX MICROSCOPIC
Bilirubin Urine: NEGATIVE
GLUCOSE, UA: NEGATIVE mg/dL
HGB URINE DIPSTICK: NEGATIVE
Ketones, ur: NEGATIVE mg/dL
Nitrite: NEGATIVE
PH: 6 (ref 5.0–8.0)
PROTEIN: NEGATIVE mg/dL

## 2017-01-04 LAB — CBC
HCT: 40.4 % (ref 36.0–46.0)
Hemoglobin: 13.2 g/dL (ref 12.0–15.0)
MCH: 30.1 pg (ref 26.0–34.0)
MCHC: 32.7 g/dL (ref 30.0–36.0)
MCV: 92 fL (ref 78.0–100.0)
PLATELETS: 202 10*3/uL (ref 150–400)
RBC: 4.39 MIL/uL (ref 3.87–5.11)
RDW: 14 % (ref 11.5–15.5)
WBC: 6.6 10*3/uL (ref 4.0–10.5)

## 2017-01-04 LAB — URINALYSIS, MICROSCOPIC (REFLEX)

## 2017-01-04 LAB — CBG MONITORING, ED: GLUCOSE-CAPILLARY: 90 mg/dL (ref 65–99)

## 2017-01-04 NOTE — ED Triage Notes (Addendum)
Patient states that she woke up this am and thought it was Saturday. THe patient states that she woke up to urinate at 1 am and she staggered slightly but reports other wise was last known "normal" last night prior to going to sleep. The patient reports that she continued with her "normal Saturday routine" like it was Saturday. Neuro otherwise intact. The patient is A/o x 4 at this time.  Daughter reports that when her mom did not show up to work that she called her mom and her mom told her it was Saturday

## 2017-01-04 NOTE — ED Notes (Signed)
Pt. Reports she got up during the night to go top the restroom and was staggering.  Pt. Reports new for her.   Pt. Daughter reports she has walked normal today.  Originally the Pt. Thought today was Saturday.  Pt. Is alert and oriented at this time with no delay in speech and able to recite what happened today.

## 2017-01-04 NOTE — ED Notes (Signed)
Family at bedside. 

## 2017-01-04 NOTE — Telephone Encounter (Signed)
Patient Name: Caliana Pinheiro  DOB: 05-23-1940    Initial Comment Caller states this morning she thought it was Saturday, and she was convinced it was Saturday until she was advised she missed work. Her arthritis has been bothering her hands, back and Rt shoulder.    Nurse Assessment  Nurse: Leilani Merl, RN, [place] Date/Time (Eastern Time): 01/04/2017 12:24:53 PM  Confirm and document reason for call. If symptomatic, describe symptoms. ---Caller states this morning she thought it was Saturday, and she was convinced it was Saturday until she was advised she missed work. Her arthritis has been bothering her hands, back and Rt shoulder.  Does the patient have any new or worsening symptoms? ---Yes  Will a triage be completed? ---Yes  Related visit to physician within the last 2 weeks? ---No  Does the PT have any chronic conditions? (i.e. diabetes, asthma, etc.) ---Yes  List chronic conditions. ---See MR  Is this a behavioral health or substance abuse call? ---No     Guidelines    Guideline Title Affirmed Question Affirmed Notes  Confusion - Delirium [1] Acting confused (e.g., disoriented, slurred speech) AND [2] brief (now gone)    Final Disposition User   See Physician within 4 Hours (or PCP triage) Standifer, RN, [place] states that she would like to be worked in to the office today instead of going to [facility] or the ED.   Referrals  REFERRED TO PCP OFFICE   Caller Disagree/Comply Comply  Caller Understands Yes  PreDisposition Call Doctor

## 2017-01-04 NOTE — ED Notes (Signed)
Pt verbalizes understanding of d/c instructions and denies any further needs at this time. 

## 2017-01-04 NOTE — ED Notes (Signed)
Pt. Urinated earlier not realizing we need sample ... She will attempt again.

## 2017-01-04 NOTE — Telephone Encounter (Signed)
I called and pt is on way to [facility] ED now.

## 2017-01-05 NOTE — ED Provider Notes (Signed)
[facility] DEPT [facility] Provider Note   CSN: [id number] Arrival date & time: 01/04/17  1500     History   Chief Complaint Chief Complaint  Patient presents with  . Altered Mental Status    HPI Sarah Young is a 76 y.o. female.  76 year old female with past medical history including fibromyalgia, hyperlipidemia, migraines, breast cancer who presents with confusion. Patient was last seen normal last night. She woke up this morning and thought that it was Saturday. She continued about her normal routine for a Saturday and daughter eventually called mom because she did not show up to work and told her it was Tuesday. The patient has not had any other confusion throughout today, no speech problems, difficulty walking, extremity numbness/weakness, vision changes, vomiting, fevers, or recent illness. No urinary symptoms. She denies any complaints currently aside from mild right-sided posterior head and neck pain that she gets often. She staggered while getting up to urinate last night but no gait problems today.   The history is provided by the patient and a relative.  Altered Mental Status      Past Medical History:  Diagnosis Date  . Anxiety   . Arthritis    OA of hands  . Breast cancer of upper-outer quadrant of right female breast ([place]) 05/08/2014  . DVT (deep venous thrombosis) ([place]) 1980s  . Fibromyalgia   . Fibromyalgia   . Hyperlipidemia   . Irritable bowel syndrome   . Migraine   . Osteopenia   . S/P radiation therapy 07/11/2014 through 08/12/2014    Right breast 4250 cGy in 17 sessions, right breast boost 1200 cGy in 6 sessions   . Vitamin D deficiency   . Wears glasses     Patient Active Problem List   Diagnosis Date Noted  . Routine general medical examination at a health care facility 11/21/2016  . Estrogen deficiency 11/19/2016  . Renal insufficiency 11/19/2016  . Pedal edema 12/15/2015   . Family history of breast cancer in female 05/17/2014  . Breast cancer of upper-outer quadrant of right female breast ([place]) 05/08/2014  . GERD (gastroesophageal reflux disease) 03/01/2014  . Lumbar degenerative disc disease 08/15/2013  . Chronic pain of scapula 08/15/2013  . Essential hypertension 10/02/2008  . Migraine 09/06/2008  . Vitamin D deficiency 06/27/2007  . HYPERLIPIDEMIA 05/11/2007  . ANXIETY 05/11/2007  . Disorder of bone and cartilage 05/11/2007  . H/O poliomyelitis 05/08/2007  . OSTEOARTHRITIS, HANDS, BILATERAL 05/08/2007  . Fibromyalgia 05/08/2007  . URINARY INCONTINENCE, STRESS, MILD 05/08/2007    Past Surgical History:  Procedure Laterality Date  . ABDOMINAL HYSTERECTOMY  1972  . APPENDECTOMY    . BREAST SURGERY  1995   lumpectomy fibrocystic breast-lt  . CATARACT EXTRACTION     both  . CHOLECYSTECTOMY  2005  . COLONOSCOPY    . ovarian cyst removed  1985  . RADIOACTIVE SEED GUIDED MASTECTOMY WITH AXILLARY SENTINEL LYMPH NODE BIOPSY Right 05/30/2014   Procedure: RADIOACTIVE SEED GUIDED PARTIAL MASTECTOMY WITH AXILLARY SENTINEL LYMPH NODE BIOPSY;  Surgeon: Autumn Messing III, MD;  Location: [facility];  Service: General;  Laterality: Right;    OB History    No data available       Home Medications    Prior to Admission medications   Medication Sig Start Date End Date Taking? Authorizing Provider  amitriptyline (ELAVIL) 10 MG tablet TAKE ONE TABLET BY MOUTH AT BEDTIME. MAY INCREASE BY ONE TABLET WEEKLY AS TOLERATED UP TO MAX OF 5 TABS/NIGHT  11/19/16   Tower, Wynelle Fanny, MD  calcium carbonate 200 MG capsule Take 250 mg by mouth 2 (two) times daily.    [provider]  Cholecalciferol (VITAMIN D PO) Take 1 tablet by mouth 2 (two) times daily. Pt takes 2000 units daily    [provider]  fish oil-omega-3 fatty acids 1000 MG capsule Take 1 g by mouth 2 (two) times daily.    [provider]  fluticasone (FLONASE) 50 MCG/ACT  nasal spray USE 1 SPRAY IN EACH NOSTRIL DAILY AS DIRECTED 11/19/16   Tower, Wynelle Fanny, MD  gabapentin (NEURONTIN) 100 MG capsule TAKE ONE CAPSULE BY MOUTH EVERY NIGHT AT BEDTIME 01/22/16   Magrinat, Virgie Dad, MD  SUMAtriptan (IMITREX) 100 MG tablet TAKE ONE TABLET AS NEEDED FOR MIGRAINE. MAY REPEAT IN TWO HOURS IF NEEDED *MAX OF 2 TABLETS IN 24 HOURS* 11/19/16   Tower, Wynelle Fanny, MD  tamoxifen (NOLVADEX) 20 MG tablet TAKE 1 TABLET (20 MG TOTAL) BY MOUTH DAILY. 02/05/16   Magrinat, Virgie Dad, MD    Family History Family History  Problem Relation Age of Onset  . Hypertension Father   . Cancer Father 86       esophageal - smoker  . Leukemia Mother   . Cancer Mother 64       leukemia  . Cancer Brother 56       breast CA  . Cancer Maternal Aunt 80       leukemia  . Cancer Paternal Aunt 54       breast    Social History Social History  Substance Use Topics  . Smoking status: Never Smoker  . Smokeless tobacco: Never Used     Comment: non smoker  . Alcohol use No     Allergies   Amoxicillin; Amoxicillin-pot clavulanate; Esomeprazole magnesium; Omeprazole; and Ranitidine hcl   Review of Systems Review of Systems All other systems reviewed and are negative except that which was mentioned in HPI   Physical Exam Updated Vital Signs BP (!) 170/76 (BP Location: Right Arm)   Pulse 88   Temp 98.3 F (36.8 C) (Oral)   Resp 18   Ht 5\' 5"  (1.651 m)   Wt 67.6 kg (149 lb)   SpO2 99%   BMI 24.79 kg/m   Physical Exam  Constitutional: She is oriented to person, place, and time. She appears well-developed and well-nourished. No distress.  Awake, alert  HENT:  Head: Normocephalic and atraumatic.  Eyes: Pupils are equal, round, and reactive to light. Conjunctivae and EOM are normal.  Neck: Neck supple.  Cardiovascular: Normal rate, regular rhythm and normal heart sounds.   No murmur heard. Pulmonary/Chest: Effort normal and breath sounds normal. No respiratory distress.  Abdominal:  Soft. Bowel sounds are normal. She exhibits no distension. There is no tenderness.  Musculoskeletal: She exhibits no edema.  Neurological: She is alert and oriented to person, place, and time. She has normal reflexes. No cranial nerve deficit. She exhibits normal muscle tone.  Fluent speech, normal finger-to-nose testing, negative pronator drift, no clonus 5/5 strength and normal sensation x all 4 extremities  Skin: Skin is warm and dry.  Psychiatric: She has a normal mood and affect. Judgment and thought content normal.  Nursing note and vitals reviewed.    ED Treatments / Results  Labs (all labs ordered are listed, but only abnormal results are displayed) Labs Reviewed  COMPREHENSIVE METABOLIC PANEL - Abnormal; Notable for the following:       Result  Value   Glucose, Bld 100 (*)    Creatinine, Ser 1.18 (*)    GFR calc non Af Amer 44 (*)    GFR calc Af Amer 51 (*)    All other components within normal limits  URINALYSIS, ROUTINE W REFLEX MICROSCOPIC - Abnormal; Notable for the following:    Specific Gravity, Urine <1.005 (*)    Leukocytes, UA TRACE (*)    All other components within normal limits  URINALYSIS, MICROSCOPIC (REFLEX) - Abnormal; Notable for the following:    Bacteria, UA RARE (*)    Squamous Epithelial / LPF 0-5 (*)    All other components within normal limits  URINE CULTURE  CBC  CBG MONITORING, ED  CBG MONITORING, ED  CBG MONITORING, ED    EKG  EKG Interpretation  Date/Time:  Tuesday January 04 2017 15:25:40 EDT Ventricular Rate:  85 PR Interval:    QRS Duration: 94 QT Interval:  386 QTC Calculation: 459 R Axis:   -15 Text Interpretation:  Sinus rhythm Borderline left axis deviation Low voltage, precordial leads No significant change since last tracing Confirmed by Theotis Burrow [phone]) on 01/04/2017 3:45:29 PM       Radiology Ct Head Wo Contrast  Result Date: 01/04/2017 CLINICAL DATA:  Acute onset of confusion beginning today. EXAM: CT HEAD  WITHOUT CONTRAST TECHNIQUE: Contiguous axial images were obtained from the base of the skull through the vertex without intravenous contrast. COMPARISON:  None FINDINGS: Brain: Mild age related volume loss. No evidence of old or acute focal infarction, intra-axial mass lesion, hemorrhage, hydrocephalus or extra-axial collection. Dilated perivascular space at the base of the brain on the right. Calcified meningioma at the left parietal vertex measuring 11 mm without mass effect upon the brain. Vascular: There is atherosclerotic calcification of the major vessels at the base of the brain. Skull: Negative Sinuses/Orbits: Clear/normal Other: None IMPRESSION: No acute or reversible finding.  Mild age related atrophy. Calcified 11 mm meningioma at the left parietal vertex, not significant. Electronically Signed   By: Nelson Chimes M.D.   On: 01/04/2017 16:18    Procedures Procedures (including critical care time)  Medications Ordered in ED Medications - No data to display   Initial Impression / Assessment and Plan / ED Course  I have reviewed the triage vital signs and the nursing notes.  Pertinent labs & imaging results that were available during my care of the patient were reviewed by me and considered in my medical decision making (see chart for details).    Pt brought in for Evaluation after she woke up this morning thinking it was Saturday and did not show up for work. She was well-appearing on my exam with reassuring vital signs. She had a normal neurologic exam. Her workup here including labs and head CT are unremarkable. UA with some WBCs. Patient denies any urinary symptoms at all, therefore added urine culture but will not treat currently. The lungs-with daughter who states that the patient has not had any major memory problems recently and has been acting completely normally here. She has demonstrated no confusion or altered behavior throughout today. Given this information and her normal exam, I  doubt acute life-threatening process such as TIA/stroke or delirium. Have instructed to follow-up with PCP and extensively reviewed return precautions. They voiced understanding and patient was discharged in satisfactory condition.  Final Clinical Impressions(s) / ED Diagnoses   Final diagnoses:  Transient disorientation    New Prescriptions Discharge Medication List as of 01/04/2017  7:05  PM       Little, Wenda Overland, MD 01/05/17 [phone]

## 2017-01-06 ENCOUNTER — Ambulatory Visit (INDEPENDENT_AMBULATORY_CARE_PROVIDER_SITE_OTHER): Payer: Medicare Other | Admitting: Primary Care

## 2017-01-06 ENCOUNTER — Ambulatory Visit: Payer: Medicare Other | Admitting: Primary Care

## 2017-01-06 ENCOUNTER — Encounter: Payer: Self-pay | Admitting: Primary Care

## 2017-01-06 DIAGNOSIS — R4182 Altered mental status, unspecified: Secondary | ICD-10-CM | POA: Diagnosis not present

## 2017-01-06 DIAGNOSIS — I10 Essential (primary) hypertension: Secondary | ICD-10-CM | POA: Diagnosis not present

## 2017-01-06 LAB — URINE CULTURE: Special Requests: NORMAL

## 2017-01-06 NOTE — Assessment & Plan Note (Signed)
Stable in the office today, will have her start monitoring home readings and report readings at or above 140/90.

## 2017-01-06 NOTE — Patient Instructions (Addendum)
Continue to monitor your blood pressure and report consistent readings at or above 140/90.  Continue with aspirin 81 mg once daily.  Remember to eat a healthy diet and get regular exercise.   Consider talking with your Doristine Bosworth about the anxiety symptoms you're experiencing. Let me know if you're interested in seeing a therapist.   Please go to the hospital if you experience: numbness/tingling and/or weakness to one side of your body, changes in your speech, dizziness.   It was a pleasure meeting you!

## 2017-01-06 NOTE — Assessment & Plan Note (Addendum)
Work up at [facility] on 01/04/17 unremarkable for CVA, TIA, delirium. Labs, imaging, notes reviewed and were unremarkable. Neuro exam today unremarkable. GAD 7 score of 8, do suspect some anxiety to be contributing, also grieving the loss of a close friend. Offered condolences.   Discussed stroke prevention including good BP control, lipid control (lipids from August 2018 look good), aspirin 81 mg once daily. Also discussed stress reduction and treatment of anxiety, offered therapy for which she kindly declines as she will speak with her pastor.   Discussed strict hospital/retrun precautions, she verbalized understanding.

## 2017-01-06 NOTE — Progress Notes (Signed)
Subjective:    Patient ID: Sarah Young, female    DOB: 06-27-1940, 76 y.o.   MRN: [id number]  HPI  Sarah Young is a 76 year old female who presents today for emergency department follow up.   She presented to [facility] on 01/04/17 with a chief complaint of altered mental status. She was last seen normal on the night of 01/03/17, woke up on 01/04/17 (Tuesday) thinking it was Saturday. Given that she thought it was Saturday she didn't show up for work. She endorsed right sided posterior head and neck pain without stroke symptoms.  During her stay in the emergency department she under went testing including: ECG (NSR, rate of 85, borderline left axis deviation, no significant change from prior ECG), CT head (unremarkable), UA (small amount of WBCs) which was not treated given low suspicion for infection (cultre returned unremarkable), CBC and CMP (unremarkable/stable). Her blood pressure was noted to be up. Her exam was without evidence of acute CVA/TIA, delirium, patient was not confused during visit. She was discharged home later that evening with recommendations for PCP follow up.  Since her emergency department visit she's not experienced any confusion. She's since returned to work. She checked her blood pressure last night and got a reading of 148/60. She denies an increase in migraines/headaches, dizziness, numbness/tingling, unilateral weakness, visual changes. She did notice difficulty when writing a check last night.   Over the past 2-3 months she's noticed increased irritability, anxiety, and a decrease in her overall patience. She denies depression but is grieving the loss of a friend who passed away earlier in 02/09/23. GAD 7 score of 8 today.   Review of Systems  Eyes: Negative for visual disturbance.  Respiratory: Negative for shortness of breath.   Cardiovascular: Negative for chest pain.  Musculoskeletal: Positive for arthralgias.  Neurological: Negative for dizziness  and numbness.       No increased headaches.   Psychiatric/Behavioral:       See HPI       Past Medical History:  Diagnosis Date  . Anxiety   . Arthritis    OA of hands  . Breast cancer of upper-outer quadrant of right female breast ([place]) 05/08/2014  . DVT (deep venous thrombosis) ([place]) 1980s  . Fibromyalgia   . Fibromyalgia   . Hyperlipidemia   . Irritable bowel syndrome   . Migraine   . Osteopenia   . S/P radiation therapy 07/11/2014 through 08/12/2014    Right breast 4250 cGy in 17 sessions, right breast boost 1200 cGy in 6 sessions   . Vitamin D deficiency   . Wears glasses      Social History   Social History  . Marital status: Married    Spouse name: Sarah Young  . Number of children: Sarah Young  . Years of education: Sarah Young   Occupational History  . Not on file.   Social History Main Topics  . Smoking status: Never Smoker  . Smokeless tobacco: Never Used     Comment: non smoker  . Alcohol use No  . Drug use: No  . Sexual activity: Not Currently   Other Topics Concern  . Not on file   Social History Narrative  . No narrative on file    Past Surgical History:  Procedure Laterality Date  . ABDOMINAL HYSTERECTOMY  1972  . APPENDECTOMY    . BREAST SURGERY  1995   lumpectomy fibrocystic breast-lt  . CATARACT EXTRACTION     both  .  CHOLECYSTECTOMY  2005  . COLONOSCOPY    . ovarian cyst removed  1985  . RADIOACTIVE SEED GUIDED MASTECTOMY WITH AXILLARY SENTINEL LYMPH NODE BIOPSY Right 05/30/2014   Procedure: RADIOACTIVE SEED GUIDED PARTIAL MASTECTOMY WITH AXILLARY SENTINEL LYMPH NODE BIOPSY;  Surgeon: Autumn Messing III, MD;  Location: [facility];  Service: General;  Laterality: Right;    Family History  Problem Relation Age of Onset  . Hypertension Father   . Cancer Father 34       esophageal - smoker  . Leukemia Mother   . Cancer Mother 63       leukemia  . Cancer Brother 48        breast CA  . Cancer Maternal Aunt 80       leukemia  . Cancer Paternal Aunt 6       breast    Allergies  Allergen Reactions  . Amoxicillin Itching    REACTION: reacts opposite effect made infection worse.  Marland Kitchen Amoxicillin-Pot Clavulanate     REACTION: Felt like body turned inside out  . Esomeprazole Magnesium     REACTION: Nausea  . Omeprazole Nausea And Vomiting    REACTION: not effective  . Ranitidine Hcl     REACTION: not effective    Current Outpatient Prescriptions on File Prior to Visit  Medication Sig Dispense Refill  . amitriptyline (ELAVIL) 10 MG tablet TAKE ONE TABLET BY MOUTH AT BEDTIME. MAY INCREASE BY ONE TABLET WEEKLY AS TOLERATED UP TO MAX OF 5 TABS/NIGHT 180 tablet 3  . calcium carbonate 200 MG capsule Take 250 mg by mouth 2 (two) times daily.    . Cholecalciferol (VITAMIN D PO) Take 1 tablet by mouth 2 (two) times daily. Pt takes 2000 units daily    . fish oil-omega-3 fatty acids 1000 MG capsule Take 1 g by mouth 2 (two) times daily.    . fluticasone (FLONASE) 50 MCG/ACT nasal spray USE 1 SPRAY IN EACH NOSTRIL DAILY AS DIRECTED 16 g 11  . gabapentin (NEURONTIN) 100 MG capsule TAKE ONE CAPSULE BY MOUTH EVERY NIGHT AT BEDTIME 90 capsule 4  . tamoxifen (NOLVADEX) 20 MG tablet TAKE 1 TABLET (20 MG TOTAL) BY MOUTH DAILY. 90 tablet 12  . SUMAtriptan (IMITREX) 100 MG tablet TAKE ONE TABLET AS NEEDED FOR MIGRAINE. MAY REPEAT IN TWO HOURS IF NEEDED *MAX OF 2 TABLETS IN 24 HOURS* (Patient not taking: Reported on 01/06/2017) 9 tablet 11   No current facility-administered medications on file prior to visit.     BP 134/68   Pulse 78   Temp 98.4 F (36.9 C) (Oral)   Ht 5' 4.5" (1.638 m)   Wt 151 lb (68.5 kg)   SpO2 98%   BMI 25.52 kg/m    Objective:   Physical Exam  Constitutional: She is oriented to person, place, and time. She appears well-nourished.  Eyes: EOM are normal.  Neck: Neck supple.  Cardiovascular: Normal rate and regular rhythm.   Pulmonary/Chest:  Effort normal and breath sounds normal.  Neurological: She is alert and oriented to person, place, and time. No cranial nerve deficit. Coordination normal.  Skin: Skin is warm and dry.          Assessment & Plan:

## 2017-01-27 NOTE — Progress Notes (Signed)
[place]  Telephone:(336) [phone] Fax:(336) [phone]     ID: Sarah Young DOB: July 02, 1940  MR#: [id number]  DCV#:[id number]  Patient Care Team: Abner Greenspan, MD as PCP - Claxton, Buckingham, MD as Consulting Physician (General Surgery) Placido Hangartner, Virgie Dad, MD as Consulting Physician (Oncology) Arloa Koh, MD as Consulting Physician (Radiation Oncology) Rockwell Germany, RN as Registered Nurse Mauro Kaufmann, RN as Registered Nurse Holley Bouche, NP as Nurse Practitioner (Nurse Practitioner) Sylvan Cheese, NP as Nurse Practitioner (Nurse Practitioner) PCP: Abner Greenspan, MD OTHER MD: Nena Polio M.D.  CHIEF COMPLAINT: Estrogen receptor positive breast cancer  CURRENT TREATMENT: tamoxifen   BREAST CANCER HISTORY: From the original intake note:  Sarah Young had routine bilateral screening [facility]. This found the breast density to be category C. A possible mass was noted in the right breast, and on 05/03/2014 she underwent right diagnostic mammography with ultrasonography. There was a spiculated mass in the upper outer quadrant of the right breast which was new since the prior mammogram. On physical exam there was mild nodularity noted in the area in question. Ultrasound confirmed a hypoechoic irregular mass measuring 0.9 cm in the upper outer quadrant.  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  MRI of the breasts is scheduled for 05/20/2014. The patient's subsequent history is as detailed below  INTERVAL HISTORY: Sarah Young returns today for follow-up and treatment of her estrogen receptor positive breast cancer.  She  continues on tamoxifen, which she is tolerating well.   REVIEW OF SYSTEMS: Sarah Young is doing well. About 6-8 weeks ago she was experiencing back pain when she brought it to medical attention, she was told she had many spots on her back, but after further research she was told it was arthritis. On Tuesday, 01/04/2017, pt woke up thinking it was a Saturday and never showed up for work. She works with her daughter who came to her house and brought her to the ED. It is thought she had a TIA and she was put on 81 mg of Aspirin. She denies unusual headaches, visual changes, nausea, vomiting, or dizziness. There has been no unusual cough, phlegm production, or pleurisy. This been no change in bowel or bladder habits. She denies unexplained fatigue or unexplained weight loss, bleeding, rash, or fever. A detailed review of systems was otherwise entirely stable.    PAST MEDICAL HISTORY: Past Medical History:  Diagnosis Date  . Anxiety   . Arthritis    OA of hands  . Breast cancer of upper-outer quadrant of right female breast ([place]) 05/08/2014  . DVT (deep venous thrombosis) ([place]) 1980s  . Fibromyalgia   . Fibromyalgia   . Hyperlipidemia   . Irritable bowel syndrome   . Migraine   . Osteopenia   . S/P radiation therapy 07/11/2014 through 08/12/2014    Right breast 4250 cGy in 17 sessions, right breast boost 1200 cGy in 6 sessions   . Vitamin D deficiency   . Wears glasses     PAST SURGICAL HISTORY: Past Surgical History:  Procedure Laterality Date  . ABDOMINAL HYSTERECTOMY  1972  . APPENDECTOMY    . [place]  lumpectomy fibrocystic breast-lt  . CATARACT EXTRACTION     both  . CHOLECYSTECTOMY  2005  . COLONOSCOPY    . ovarian cyst removed  1985    FAMILY HISTORY Family History  Problem Relation Age of Onset  . Hypertension Father   . Cancer Father 11       esophageal - smoker  . Leukemia Mother   .  Cancer Mother 49       leukemia  . Cancer Brother 83       breast CA  . Cancer Maternal Aunt 80       leukemia  . Cancer Paternal Aunt 27       breast   the patient's father died at the age of 29 with gastric cancer. The patient's mother was diagnosed with chronic leukemia at age 76 and died from complications of dementia at age 66 the patient has one brother, diagnosed with breast cancer at the age of 55. She has 2 sisters. There is no other history of breast or ovarian cancer in the family.  GYNECOLOGIC HISTORY:  No LMP recorded. Patient has had a hysterectomy. Menarche age 89, first live birth age 57. The patient is GX P2. She underwent total abdominal hysterectomy with bilateral salpingo-oophorectomy 1972. She used hormone replacement for approximately 38 years, until 2011.  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 Her husband Sarah Young is currently not active in the business. Daughter Sarah Young, lives in [place], his Pres. The other daughter, Sarah Young, lives in [place] where she works as a Landscape architect. The patient has 3 grandchildren. She attends a local [employer] DIRECTIVES: The patient has named both her husband Sarah Young and her daughter Sarah Young as Equities trader powers of attorney.   HEALTH MAINTENANCE: Social History   Tobacco Use  . Smoking status: Never Smoker  . Smokeless tobacco: Never Used  . Tobacco comment: non smoker  Substance Use Topics  . Alcohol use: No    Alcohol/week: 0.0 oz  . Drug use: No     Colonoscopy:  PAP:  Bone density:  Lipid panel:  Allergies  Allergen Reactions  . Amoxicillin Itching    REACTION: reacts opposite effect made infection worse.  Marland Kitchen Amoxicillin-Pot Clavulanate     REACTION: Felt like body turned inside out  . Esomeprazole Magnesium     REACTION: Nausea  . Omeprazole Nausea And Vomiting    REACTION: not effective  .  Ranitidine Hcl     REACTION: not effective    Current Outpatient Medications  Medication Sig Dispense Refill  . amitriptyline (ELAVIL) 10 MG tablet TAKE ONE TABLET BY MOUTH AT BEDTIME. MAY INCREASE BY ONE TABLET WEEKLY AS TOLERATED UP TO MAX OF 5 TABS/NIGHT 180 tablet 3  . aspirin EC 81 MG tablet Take 81 mg by mouth daily.    . calcium carbonate 200 MG capsule Take 250 mg by mouth 2 (two) times daily.    . Cholecalciferol (VITAMIN D PO) Take 1 tablet by mouth 2 (two) times daily. Pt takes 2000 units daily    . fish oil-omega-3 fatty acids 1000 MG capsule Take 1 g by mouth 2 (two) times daily.    . fluticasone (FLONASE) 50 MCG/ACT nasal spray USE 1 SPRAY IN EACH NOSTRIL DAILY AS DIRECTED 16 g 11  . gabapentin (NEURONTIN) 100 MG capsule TAKE ONE CAPSULE BY MOUTH EVERY NIGHT AT BEDTIME 90 capsule 4  .  SUMAtriptan (IMITREX) 100 MG tablet TAKE ONE TABLET AS NEEDED FOR MIGRAINE. MAY REPEAT IN TWO HOURS IF NEEDED *MAX OF 2 TABLETS IN 24 HOURS* 9 tablet 11  . tamoxifen (NOLVADEX) 20 MG tablet TAKE 1 TABLET (20 MG TOTAL) BY MOUTH DAILY. 90 tablet 12   No current facility-administered medications for this visit.     OBJECTIVE: Older white woman who appears stated age  38:   02/03/17 0957  BP: (!) 140/59  Pulse: 79  Resp: 18  Temp: 98.7 F (37.1 C)  SpO2: 100%     Body mass index is 25.35 kg/m.    ECOG FS:0 - Asymptomatic  Sclerae unicteric, pupils round and equal Oropharynx clear and moist No cervical or supraclavicular adenopathy Lungs no rales or rhonchi Heart regular rate and rhythm Abd soft, nontender, positive bowel sounds MSK no focal spinal tenderness, no upper extremity lymphedema Neuro: No focal weakness, no sensory level, alert and oriented x3, appropriate affect Breasts: The right breast is status post lumpectomy and radiation with no evidence of local recurrence.  Left breast is benign.  Both axillae are benign  LAB RESULTS:  CMP     Component Value Date/Time   NA  141 02/03/2017 0932   K 4.8 02/03/2017 0932   CL 105 01/04/2017 1535   CO2 27 02/03/2017 0932   GLUCOSE 89 02/03/2017 0932   BUN 17.7 02/03/2017 0932   CREATININE 1.3 (H) 02/03/2017 0932   CALCIUM 9.4 02/03/2017 0932   PROT 6.5 02/03/2017 0932   ALBUMIN 3.4 (L) 02/03/2017 0932   AST 19 02/03/2017 0932   ALT 12 02/03/2017 0932   ALKPHOS 42 02/03/2017 0932   BILITOT 0.37 02/03/2017 0932   GFRNONAA 44 (L) 01/04/2017 1535   GFRAA 51 (L) 01/04/2017 1535    INo results found for: SPEP, UPEP  Lab Results  Component Value Date   WBC 5.0 02/03/2017   NEUTROABS 3.2 02/03/2017   HGB 13.2 02/03/2017   HCT 40.1 02/03/2017   MCV 92.0 02/03/2017   PLT 198 02/03/2017      Chemistry      Component Value Date/Time   NA 141 02/03/2017 0932   K 4.8 02/03/2017 0932   CL 105 01/04/2017 1535   CO2 27 02/03/2017 0932   BUN 17.7 02/03/2017 0932   CREATININE 1.3 (H) 02/03/2017 0932      Component Value Date/Time   CALCIUM 9.4 02/03/2017 0932   ALKPHOS 42 02/03/2017 0932   AST 19 02/03/2017 0932   ALT 12 02/03/2017 0932   BILITOT 0.37 02/03/2017 0932       No results found for: LABCA2  No components found for: LABCA125  No results for input(s): INR in the last 168 hours.  Urinalysis    Component Value Date/Time   COLORURINE YELLOW 01/04/2017 [facility] 01/04/2017 1755   LABSPEC <1.005 (L) 01/04/2017 1755   PHURINE 6.0 01/04/2017 1755   GLUCOSEU NEGATIVE 01/04/2017 1755   HGBUR NEGATIVE 01/04/2017 1755   BILIRUBINUR NEGATIVE 01/04/2017 1755   KETONESUR NEGATIVE 01/04/2017 1755   PROTEINUR NEGATIVE 01/04/2017 1755   NITRITE NEGATIVE 01/04/2017 1755   LEUKOCYTESUR TRACE (A) 01/04/2017 1755    STUDIES: Ct Head Wo Contrast  Result Date: 01/04/2017 CLINICAL DATA:  Acute onset of confusion beginning today. EXAM: CT HEAD WITHOUT CONTRAST TECHNIQUE: Contiguous axial images were obtained from the base of the skull through the vertex without intravenous contrast.  COMPARISON:  None FINDINGS: Brain: Mild age related volume loss. No evidence  of old or acute focal infarction, intra-axial mass lesion, hemorrhage, hydrocephalus or extra-axial collection. Dilated perivascular space at the base of the brain on the right. Calcified meningioma at the left parietal vertex measuring 11 mm without mass effect upon the brain. Vascular: There is atherosclerotic calcification of the major vessels at the base of the brain. Skull: Negative Sinuses/Orbits: Clear/normal Other: None IMPRESSION: No acute or reversible finding.  Mild age related atrophy. Calcified 11 mm meningioma at the left parietal vertex, not significant. Electronically Signed   By: Nelson Chimes M.D.   On: 01/04/2017 16:18   Bone Density, 12/27/2016, with a T-score of -2.3.  Diagnostic Bilateral Breast Mammogram TOMO, 05/27/2016, breast density category B, shows no evidence of malignancy.  ASSESSMENT: 76 y.o. BRCA negative [place] woman status post right breast Upper outer quadrant biopsy 05/03/2014 for a clinical T1b N0, stage IA invasive ductal carcinoma, grade 1 or 2, estrogen receptor positive, progesterone receptor negative, with an MIB-1 of 12%, and HER-2/neu equivocal  (1) right lumpectomy and sentinel lymph node sampling 05/30/2014 showed a pT1c pN0, stage IA invasive ductal carcinoma, grade 1, repeat HER-2 again negative.  (2) genetics testing 05/17/2014 showed no deleterious mutations in ATM, BARD1, BRCA1, BRCA2, BRIP1, CDH1, CHEK2, MRE11A, MUTYH, NBN, NF1, PALB2, PTEN, RAD50, RAD51C, RAD51D, and TP53. [facility] [place] genetics, BreastNext panel)  (3) Oncotype score of 25 predicts a risk of distant recurrence of 16% if the patient's only systemic treatment is tamoxifen for 5 years; it also predicts a benefit from chemotherapy in the 5% range. Given this marginal benefit the patient opted against adjuvant chemotherapy  (4) adjuvant radiation completed 08/12/2014: Right breast 4250 cGy in 17 sessions, right breast  boost 1200 cGy in 6 sessions  (5) started tamoxifen 10/10/2014  (a) status post hysterectomy and bilateral salpingo-oophorectomy in 1972  PLAN: Sarah Young is now 2-1/2 years out from definitive surgery for her breast cancer with no evidence of recurrence.  This is very favorable.  We reviewed her bone scan, which was negative, and of course her noncontrast head CT which was also benign.  Her neurologic exam today is fine and she is certainly well oriented and totally intact mentally  She denies any intercurrent stress.  She is not on any psychotropics.  I have no simple explanation why she made the date mistake a couple of weeks ago.  At [facility] this does not happen again more fine.  Otherwise I think she would warrant formal neurologic evaluation  From a breast cancer point of view she will see me again next October.  She knows to call for any problems that may develop before that visit.  Sarah Young, Virgie Dad, MD  02/03/17 10:34 AM Medical Oncology and Hematology [facility] [address] [place], [place] [address] Tel. [phone]    Fax. [phone]  This document serves as a record of services personally performed by Chauncey Cruel, MD. It was created on his behalf by Margit Banda, a trained medical scribe. The creation of this record is based on the scribe's personal observations and the provider's statements to them.   I have reviewed the above documentation for accuracy and completeness, and I agree with the above.

## 2017-02-02 ENCOUNTER — Other Ambulatory Visit: Payer: Self-pay

## 2017-02-02 DIAGNOSIS — C50411 Malignant neoplasm of upper-outer quadrant of right female breast: Secondary | ICD-10-CM

## 2017-02-02 DIAGNOSIS — Z170 Estrogen receptor positive status [ER+]: Principal | ICD-10-CM

## 2017-02-03 ENCOUNTER — Other Ambulatory Visit (HOSPITAL_BASED_OUTPATIENT_CLINIC_OR_DEPARTMENT_OTHER): Payer: Medicare Other

## 2017-02-03 ENCOUNTER — Telehealth: Payer: Self-pay | Admitting: Oncology

## 2017-02-03 ENCOUNTER — Ambulatory Visit (HOSPITAL_BASED_OUTPATIENT_CLINIC_OR_DEPARTMENT_OTHER): Payer: Medicare Other | Admitting: Oncology

## 2017-02-03 VITALS — BP 140/59 | HR 79 | Temp 98.7°F | Resp 18 | Ht 64.5 in | Wt 150.0 lb

## 2017-02-03 DIAGNOSIS — C50411 Malignant neoplasm of upper-outer quadrant of right female breast: Secondary | ICD-10-CM

## 2017-02-03 DIAGNOSIS — Z170 Estrogen receptor positive status [ER+]: Principal | ICD-10-CM

## 2017-02-03 LAB — CBC WITH DIFFERENTIAL/PLATELET
BASO%: 1.1 % (ref 0.0–2.0)
Basophils Absolute: 0.1 10*3/uL (ref 0.0–0.1)
EOS ABS: 0.1 10*3/uL (ref 0.0–0.5)
EOS%: 1.2 % (ref 0.0–7.0)
HCT: 40.1 % (ref 34.8–46.6)
HEMOGLOBIN: 13.2 g/dL (ref 11.6–15.9)
LYMPH%: 26.3 % (ref 14.0–49.7)
MCH: 30.3 pg (ref 25.1–34.0)
MCHC: 32.9 g/dL (ref 31.5–36.0)
MCV: 92 fL (ref 79.5–101.0)
MONO#: 0.4 10*3/uL (ref 0.1–0.9)
MONO%: 8 % (ref 0.0–14.0)
NEUT%: 63.4 % (ref 38.4–76.8)
NEUTROS ABS: 3.2 10*3/uL (ref 1.5–6.5)
PLATELETS: 198 10*3/uL (ref 145–400)
RBC: 4.36 10*6/uL (ref 3.70–5.45)
RDW: 14 % (ref 11.2–14.5)
WBC: 5 10*3/uL (ref 3.9–10.3)
lymph#: 1.3 10*3/uL (ref 0.9–3.3)

## 2017-02-03 LAB — COMPREHENSIVE METABOLIC PANEL
ALT: 12 U/L (ref 0–55)
ANION GAP: 7 meq/L (ref 3–11)
AST: 19 U/L (ref 5–34)
Albumin: 3.4 g/dL — ABNORMAL LOW (ref 3.5–5.0)
Alkaline Phosphatase: 42 U/L (ref 40–150)
BUN: 17.7 mg/dL (ref 7.0–26.0)
CHLORIDE: 106 meq/L (ref 98–109)
CO2: 27 meq/L (ref 22–29)
CREATININE: 1.3 mg/dL — AB (ref 0.6–1.1)
Calcium: 9.4 mg/dL (ref 8.4–10.4)
EGFR: 40 mL/min/{1.73_m2} — ABNORMAL LOW (ref >60)
Glucose: 89 mg/dl (ref 70–140)
Potassium: 4.8 mEq/L (ref 3.5–5.1)
SODIUM: 141 meq/L (ref 136–145)
Total Bilirubin: 0.37 mg/dL (ref 0.20–1.20)
Total Protein: 6.5 g/dL (ref 6.4–8.3)

## 2017-02-03 LAB — DRAW EXTRA CLOT TUBE

## 2017-02-03 NOTE — Telephone Encounter (Signed)
Gave patient avs and calendar with appts per 11/8 los.  °

## 2017-02-10 DIAGNOSIS — Z961 Presence of intraocular lens: Secondary | ICD-10-CM | POA: Diagnosis not present

## 2017-02-10 DIAGNOSIS — H35363 Drusen (degenerative) of macula, bilateral: Secondary | ICD-10-CM | POA: Diagnosis not present

## 2017-02-10 DIAGNOSIS — H26493 Other secondary cataract, bilateral: Secondary | ICD-10-CM | POA: Diagnosis not present

## 2017-02-25 ENCOUNTER — Ambulatory Visit: Payer: Medicare Other | Admitting: Family Medicine

## 2017-03-08 ENCOUNTER — Ambulatory Visit: Payer: Medicare Other | Admitting: Family Medicine

## 2017-03-25 ENCOUNTER — Other Ambulatory Visit: Payer: Self-pay | Admitting: Oncology

## 2017-04-05 ENCOUNTER — Encounter: Payer: Self-pay | Admitting: Family Medicine

## 2017-04-05 ENCOUNTER — Ambulatory Visit: Payer: Medicare Other | Admitting: Family Medicine

## 2017-04-05 VITALS — BP 146/70 | HR 79 | Temp 98.3°F | Ht 64.5 in | Wt 152.2 lb

## 2017-04-05 DIAGNOSIS — N289 Disorder of kidney and ureter, unspecified: Secondary | ICD-10-CM | POA: Diagnosis not present

## 2017-04-05 DIAGNOSIS — I10 Essential (primary) hypertension: Secondary | ICD-10-CM | POA: Diagnosis not present

## 2017-04-05 DIAGNOSIS — R4182 Altered mental status, unspecified: Secondary | ICD-10-CM | POA: Diagnosis not present

## 2017-04-05 DIAGNOSIS — Z170 Estrogen receptor positive status [ER+]: Secondary | ICD-10-CM | POA: Diagnosis not present

## 2017-04-05 DIAGNOSIS — F411 Generalized anxiety disorder: Secondary | ICD-10-CM | POA: Diagnosis not present

## 2017-04-05 DIAGNOSIS — C50411 Malignant neoplasm of upper-outer quadrant of right female breast: Secondary | ICD-10-CM | POA: Diagnosis not present

## 2017-04-05 DIAGNOSIS — J0100 Acute maxillary sinusitis, unspecified: Secondary | ICD-10-CM

## 2017-04-05 DIAGNOSIS — J019 Acute sinusitis, unspecified: Secondary | ICD-10-CM | POA: Insufficient documentation

## 2017-04-05 MED ORDER — AZITHROMYCIN 250 MG PO TABS: ORAL_TABLET | ORAL | 0 refills | Status: DC

## 2017-04-05 MED ORDER — AMLODIPINE BESYLATE 5 MG PO TABS: 5.0000 mg | ORAL_TABLET | Freq: Every day | ORAL | 11 refills | Status: DC

## 2017-04-05 MED ORDER — BENZONATATE 200 MG PO CAPS: 200.0000 mg | ORAL_CAPSULE | Freq: Three times a day (TID) | ORAL | 1 refills | Status: DC | PRN

## 2017-04-05 NOTE — Progress Notes (Signed)
Subjective:    Patient ID: Sarah Young, female    DOB: April 22, 1940, 77 y.o.   MRN: [id number]  HPI Here for f/u of chronic medical problems and also cough   Wt Readings from Last 3 Encounters:  04/05/17 152 lb 4 oz (69.1 kg)  02/03/17 150 lb (68 kg)  01/06/17 151 lb (68.5 kg)   25.73 kg/m  Last visit saw NP Clark for f/u from [place] change episode- with complete neg w/u in the ED   (got up one am and thought it was the wrong day/saturday and the L side of her face was sore)  Resolved  bp was mildly elevated at the time  ? Transient global amnesia  A good friend of her had just passed away- she had cared for her   Takes amitriptyline and also gabapentin   No further episodes   bp is stable today  No cp or palpitations or headaches or edema  No side effects to medicines  BP Readings from Last 3 Encounters:  04/05/17 (!) 146/70  02/03/17 (!) 140/59  01/06/17 134/68     Monitoring at home - not as low as it used to be  Running 140/ ?  Takes a baby aspirin every day  She tries to watch her sodium  Tries to take care of herself   Come chronic pain -arthritis in spine and hands  Does walk for exercise   Cough-ongoing since she caught a cold Stuffy nose and sinus pain  Yellow nasal d/c and sputum No fever Feels tired  She cannot seem to shake it  Has tried some otc medicines    Lab Results  Component Value Date   CREATININE 1.3 (H) 02/03/2017   BUN 17.7 02/03/2017   NA 141 02/03/2017   K 4.8 02/03/2017   CL 105 01/04/2017   CO2 27 02/03/2017   Hx of renal insuff  Stable  Fluid intake   Lab Results  Component Value Date   ALT 12 02/03/2017   AST 19 02/03/2017   ALKPHOS 42 02/03/2017   BILITOT 0.37 02/03/2017    Lab Results  Component Value Date   WBC 5.0 02/03/2017   HGB 13.2 02/03/2017   HCT 40.1 02/03/2017   MCV 92.0 02/03/2017   PLT 198 02/03/2017   Lab Results  Component Value Date   TSH 3.46 11/12/2016   Lab Results  Component Value Date    CHOL 193 11/12/2016   HDL 68.80 11/12/2016   LDLCALC 102 (H) 11/12/2016   LDLDIRECT 143.9 04/20/2013   TRIG 108.0 11/12/2016   CHOLHDL 3 11/12/2016     Anxiety/stress --grief  Thinks she handles it pretty well / very good support  She is also talking to her pastor-that is helping   Patient Active Problem List   Diagnosis Date Noted  . Acute sinusitis 04/05/2017  . Altered mental status 01/06/2017  . Routine general medical examination at a health care facility 11/21/2016  . Estrogen deficiency 11/19/2016  . Renal insufficiency 11/19/2016  . Pedal edema 12/15/2015  . Family history of breast cancer in female 05/17/2014  . Malignant neoplasm of upper-outer quadrant of right breast in female, estrogen receptor positive ([place]) 05/08/2014  . GERD (gastroesophageal reflux disease) 03/01/2014  . Lumbar degenerative disc disease 08/15/2013  . Chronic pain of scapula 08/15/2013  . Essential hypertension 10/02/2008  . Migraine 09/06/2008  . Vitamin D deficiency 06/27/2007  . HYPERLIPIDEMIA 05/11/2007  . Generalized anxiety disorder 05/11/2007  . Disorder of bone  and cartilage 05/11/2007  . H/O poliomyelitis 05/08/2007  . OSTEOARTHRITIS, HANDS, BILATERAL 05/08/2007  . Fibromyalgia 05/08/2007  . URINARY INCONTINENCE, STRESS, MILD 05/08/2007   Past Medical History:  Diagnosis Date  . Anxiety   . Arthritis    OA of hands  . Breast cancer of upper-outer quadrant of right female breast ([place]) 05/08/2014  . DVT (deep venous thrombosis) ([place]) 1980s  . Fibromyalgia   . Fibromyalgia   . Hyperlipidemia   . Irritable bowel syndrome   . Migraine   . Osteopenia   . S/P radiation therapy 07/11/2014 through 08/12/2014    Right breast 4250 cGy in 17 sessions, right breast boost 1200 cGy in 6 sessions   . Vitamin D deficiency   . Wears glasses    Past Surgical History:  Procedure Laterality Date  . ABDOMINAL  HYSTERECTOMY  1972  . APPENDECTOMY    . BREAST SURGERY  1995   lumpectomy fibrocystic breast-lt  . CATARACT EXTRACTION     both  . CHOLECYSTECTOMY  2005  . COLONOSCOPY    . ovarian cyst removed  1985  . RADIOACTIVE SEED GUIDED PARTIAL MASTECTOMY WITH AXILLARY SENTINEL LYMPH NODE BIOPSY Right 05/30/2014   Procedure: RADIOACTIVE SEED GUIDED PARTIAL MASTECTOMY WITH AXILLARY SENTINEL LYMPH NODE BIOPSY;  Surgeon: Autumn Messing III, MD;  Location: [facility];  Service: General;  Laterality: Right;   Social History   Tobacco Use  . Smoking status: Never Smoker  . Smokeless tobacco: Never Used  . Tobacco comment: non smoker  Substance Use Topics  . Alcohol use: No    Alcohol/week: 0.0 oz  . Drug use: No   Family History  Problem Relation Age of Onset  . Hypertension Father   . Cancer Father 51       esophageal - smoker  . Leukemia Mother   . Cancer Mother 63       leukemia  . Cancer Brother 11       breast CA  . Cancer Maternal Aunt 80       leukemia  . Cancer Paternal Aunt 60       breast   Allergies  Allergen Reactions  . Amoxicillin Itching    REACTION: reacts opposite effect made infection worse.  Marland Kitchen Amoxicillin-Pot Clavulanate     REACTION: Felt like body turned inside out  . Esomeprazole Magnesium     REACTION: Nausea  . Omeprazole Nausea And Vomiting    REACTION: not effective  . Ranitidine Hcl     REACTION: not effective   Current Outpatient Medications on File Prior to Visit  Medication Sig Dispense Refill  . amitriptyline (ELAVIL) 10 MG tablet TAKE ONE TABLET BY MOUTH AT BEDTIME. MAY INCREASE BY ONE TABLET WEEKLY AS TOLERATED UP TO MAX OF 5 TABS/NIGHT 180 tablet 3  . aspirin EC 81 MG tablet Take 81 mg by mouth daily.    . calcium carbonate 200 MG capsule Take 250 mg by mouth 2 (two) times daily.    . Cholecalciferol (VITAMIN D PO) Take 1 tablet by mouth 2 (two) times daily. Pt takes 2000 units daily    . fish oil-omega-3 fatty acids 1000 MG capsule  Take 1 g by mouth 2 (two) times daily.    . fluticasone (FLONASE) 50 MCG/ACT nasal spray USE 1 SPRAY IN EACH NOSTRIL DAILY AS DIRECTED 16 g 11  . gabapentin (NEURONTIN) 100 MG capsule TAKE ONE CAPSULE BY MOUTH EVERY NIGHT AT BEDTIME 90 capsule 4  . SUMAtriptan (IMITREX)  100 MG tablet TAKE ONE TABLET AS NEEDED FOR MIGRAINE. MAY REPEAT IN TWO HOURS IF NEEDED *MAX OF 2 TABLETS IN 24 HOURS* 9 tablet 11  . tamoxifen (NOLVADEX) 20 MG tablet TAKE 1 TABLET (20 MG TOTAL) BY MOUTH DAILY. 90 tablet 11   No current facility-administered medications on file prior to visit.      Review of Systems  Constitutional: Positive for fatigue. Negative for activity change, appetite change, fever and unexpected weight change.  HENT: Positive for rhinorrhea, sinus pressure, sinus pain and sore throat. Negative for congestion and ear pain.   Eyes: Negative for pain, redness and visual disturbance.  Respiratory: Positive for cough. Negative for shortness of breath and wheezing.   Cardiovascular: Negative for chest pain and palpitations.  Gastrointestinal: Negative for abdominal pain, blood in stool, constipation and diarrhea.  Endocrine: Negative for polydipsia and polyuria.  Genitourinary: Negative for dysuria, frequency and urgency.  Musculoskeletal: Negative for arthralgias, back pain and myalgias.  Skin: Negative for pallor and rash.  Allergic/Immunologic: Negative for environmental allergies.  Neurological: Negative for dizziness, syncope and headaches.       No further MS changes   Hematological: Negative for adenopathy. Does not bruise/bleed easily.  Psychiatric/Behavioral: Positive for dysphoric mood. Negative for decreased concentration. The patient is nervous/anxious.        Continues to deal with grief and stress   No further ms changes        Objective:   Physical Exam  Constitutional: She appears well-developed and well-nourished. No distress.  Well appearing  HENT:  Head: Normocephalic and  atraumatic.  Right Ear: External ear normal.  Left Ear: External ear normal.  Mouth/Throat: Oropharynx is clear and moist. No oropharyngeal exudate.  Nares are injected and congested  Bilateral maxillary sinus tenderness  Post nasal drip   Eyes: Conjunctivae and EOM are normal. Pupils are equal, round, and reactive to light. Right eye exhibits no discharge. Left eye exhibits no discharge.  Neck: Normal range of motion. Neck supple. No JVD present. Carotid bruit is not present. No thyromegaly present.  Cardiovascular: Normal rate, regular rhythm, normal heart sounds and intact distal pulses. Exam reveals no gallop.  Pulmonary/Chest: Effort normal and breath sounds normal. No respiratory distress. She has no wheezes. She has no rales.  No crackles  Abdominal: Soft. Bowel sounds are normal. She exhibits no distension, no abdominal bruit and no mass. There is no tenderness.  Musculoskeletal: She exhibits no edema.  Lymphadenopathy:    She has no cervical adenopathy.  Neurological: She is alert. She has normal reflexes. No cranial nerve deficit.  Skin: Skin is warm and dry. No rash noted. No pallor.  Psychiatric: She has a normal mood and affect.  Fatigued and stressed            Assessment & Plan:   Problem List Items Addressed This Visit      Cardiovascular and Mediastinum   Essential hypertension - Primary    bp has gradually risen BP: (!) 146/70    Will start amlodipine 5 mg daily  Disc poss side eff F/u planned DASH diet disc along with exercise and self care       Relevant Medications   amLODipine (NORVASC) 5 MG tablet     Respiratory   Acute sinusitis    tx with zpak (cannot take pcn) Disc symptomatic care - see instructions on AVS  Tessalon and guif dm for cough  Update if not starting to improve in a week or if  worsening        Relevant Medications   azithromycin (ZITHROMAX Z-PAK) 250 MG tablet   benzonatate (TESSALON) 200 MG capsule     Genitourinary    Renal insufficiency    Rev renal labs Enc good fluid intake Added amlodipine for bp        Other   Altered mental status    Reviewed hospital records, lab results and studies in detail  In retrospect I think this may have been an episode of transient global amnesia  ? Due to stress or other  Explained this  She will still continue low dose asa however Disc s/s cva to watch for  Watch closely      Generalized anxiety disorder    This may be responsible for her episode of MS change (? Transient global amnesia) in the past  No re occurences  Declines counseling  Good support  Enc self care  Continue pastoral help      Malignant neoplasm of upper-outer quadrant of right breast in female, estrogen receptor positive ([place])    Continues to do well      Relevant Medications   azithromycin (ZITHROMAX Z-PAK) 250 MG tablet

## 2017-04-05 NOTE — Patient Instructions (Addendum)
[facility] investigate other forms of exercise that may not bother your joints as much   Eat well/take care of yourself and drink more water   For sinus infection-take the zpak  Tessalon for cough  mucinex dm for cough  Update if not starting to improve in a week or if worsening    For blood pressure start amlodipine 5 mg once daily  If any problems/side effects stop it and let us know  Follow up in 3-4 weeks for blood pressure visit  Bring your home cuff

## 2017-04-06 NOTE — Assessment & Plan Note (Signed)
bp has gradually risen BP: (!) 146/70    Will start amlodipine 5 mg daily  Disc poss side eff F/u planned DASH diet disc along with exercise and self care

## 2017-04-06 NOTE — Assessment & Plan Note (Signed)
This may be responsible for her episode of MS change (? Transient global amnesia) in the past  No re occurences  Declines counseling  Good support  Enc self care  Continue pastoral help

## 2017-04-06 NOTE — Assessment & Plan Note (Signed)
Continues to do well 

## 2017-04-06 NOTE — Assessment & Plan Note (Signed)
tx with zpak (cannot take pcn) Disc symptomatic care - see instructions on AVS  Tessalon and guif dm for cough  Update if not starting to improve in a week or if worsening

## 2017-04-06 NOTE — Assessment & Plan Note (Signed)
Reviewed hospital records, lab results and studies in detail  In retrospect I think this may have been an episode of transient global amnesia  ? Due to stress or other  Explained this  She will still continue low dose asa however Disc s/s cva to watch for  Watch closely

## 2017-04-06 NOTE — Assessment & Plan Note (Signed)
Rev renal labs Enc good fluid intake Added amlodipine for bp

## 2017-04-19 DIAGNOSIS — C50411 Malignant neoplasm of upper-outer quadrant of right female breast: Secondary | ICD-10-CM | POA: Diagnosis not present

## 2017-05-03 ENCOUNTER — Ambulatory Visit: Payer: Medicare Other | Admitting: Family Medicine

## 2017-05-03 ENCOUNTER — Encounter: Payer: Self-pay | Admitting: Family Medicine

## 2017-05-03 VITALS — BP 125/60 | HR 77 | Temp 98.0°F | Ht 64.5 in | Wt 152.8 lb

## 2017-05-03 DIAGNOSIS — J0100 Acute maxillary sinusitis, unspecified: Secondary | ICD-10-CM

## 2017-05-03 DIAGNOSIS — I10 Essential (primary) hypertension: Secondary | ICD-10-CM

## 2017-05-03 DIAGNOSIS — N289 Disorder of kidney and ureter, unspecified: Secondary | ICD-10-CM | POA: Diagnosis not present

## 2017-05-03 LAB — RENAL FUNCTION PANEL
ALBUMIN: 3.7 g/dL (ref 3.5–5.2)
BUN: 22 mg/dL (ref 6–23)
CHLORIDE: 105 meq/L (ref 96–112)
CO2: 31 mEq/L (ref 19–32)
Calcium: 9.2 mg/dL (ref 8.4–10.5)
Creatinine, Ser: 1.18 mg/dL (ref 0.40–1.20)
GFR: 47.21 mL/min — AB (ref >60.00)
Glucose, Bld: 86 mg/dL (ref 70–99)
POTASSIUM: 4.8 meq/L (ref 3.5–5.1)
Phosphorus: 3.3 mg/dL (ref 2.3–4.6)
SODIUM: 141 meq/L (ref 135–145)

## 2017-05-03 MED ORDER — AZITHROMYCIN 250 MG PO TABS: ORAL_TABLET | ORAL | 0 refills | Status: DC

## 2017-05-03 NOTE — Assessment & Plan Note (Signed)
Briefly improved after zpack- now L sided maxillary pain/focal Still purulent nasal drainage  Repeat the zpak (pcn all)  If not better in 7-10 d may need ENT consult  Disc symptomatic care - see instructions on AVS

## 2017-05-03 NOTE — Progress Notes (Signed)
Subjective:    Patient ID: Sarah Young, female    DOB: 09/21/1940, 77 y.o.   MRN: [id number]  HPI Here for f/u of chronic health problems   Last visit we added amlodipine 5 mg daily for elevated bp  bp is improved today  No cp or palpitations or headaches or edema  No side effects to medicines  BP Readings from Last 3 Encounters:  05/03/17 (!) 136/56  04/05/17 (!) 146/70  02/03/17 (!) 140/59    Improved  tx with abx and her sinuses got better and then worse again (after about 3 days) - after zpack  Sinuses are killing her / some nosebleeds  Still colored nasal nasal drainage  Has never been unable to get rid of a sinus infection before  A little cough  Certain it is not a cold  Pain is worse on L    He cuff still reads high at 160/82 Better when she adjusted it 129/76   Wt Readings from Last 3 Encounters:  05/03/17 152 lb 12 oz (69.3 kg)  04/05/17 152 lb 4 oz (69.1 kg)  02/03/17 150 lb (68 kg)   25.81 kg/m   Lab Results  Component Value Date   CREATININE 1.3 (H) 02/03/2017   BUN 17.7 02/03/2017   NA 141 02/03/2017   K 4.8 02/03/2017   CL 105 01/04/2017   CO2 27 02/03/2017   She tries to drink lots of water  Lab Results  Component Value Date   ALT 12 02/03/2017   AST 19 02/03/2017   ALKPHOS 42 02/03/2017   BILITOT 0.37 02/03/2017    Patient Active Problem List   Diagnosis Date Noted  . Sinusitis, acute 04/05/2017  . Routine general medical examination at a health care facility 11/21/2016  . Estrogen deficiency 11/19/2016  . Renal insufficiency 11/19/2016  . Pedal edema 12/15/2015  . Family history of breast cancer in female 05/17/2014  . Malignant neoplasm of upper-outer quadrant of right breast in female, estrogen receptor positive ([place]) 05/08/2014  . GERD (gastroesophageal reflux disease) 03/01/2014  . Lumbar degenerative disc disease 08/15/2013  . Chronic pain of scapula 08/15/2013  . Essential hypertension 10/02/2008  . Migraine 09/06/2008  .  Vitamin D deficiency 06/27/2007  . HYPERLIPIDEMIA 05/11/2007  . Generalized anxiety disorder 05/11/2007  . Disorder of bone and cartilage 05/11/2007  . H/O poliomyelitis 05/08/2007  . OSTEOARTHRITIS, HANDS, BILATERAL 05/08/2007  . Fibromyalgia 05/08/2007  . URINARY INCONTINENCE, STRESS, MILD 05/08/2007   Past Medical History:  Diagnosis Date  . Anxiety   . Arthritis    OA of hands  . Breast cancer of upper-outer quadrant of right female breast ([place]) 05/08/2014  . DVT (deep venous thrombosis) ([place]) 1980s  . Fibromyalgia   . Fibromyalgia   . Hyperlipidemia   . Irritable bowel syndrome   . Migraine   . Osteopenia   . S/P radiation therapy 07/11/2014 through 08/12/2014    Right breast 4250 cGy in 17 sessions, right breast boost 1200 cGy in 6 sessions   . Vitamin D deficiency   . Wears glasses    Past Surgical History:  Procedure Laterality Date  . ABDOMINAL HYSTERECTOMY  1972  . APPENDECTOMY    . BREAST SURGERY  1995   lumpectomy fibrocystic breast-lt  . CATARACT EXTRACTION     both  . CHOLECYSTECTOMY  2005  . COLONOSCOPY    . ovarian cyst removed  1985  . RADIOACTIVE SEED GUIDED PARTIAL MASTECTOMY WITH AXILLARY SENTINEL LYMPH NODE BIOPSY  Right 05/30/2014   Procedure: RADIOACTIVE SEED GUIDED PARTIAL MASTECTOMY WITH AXILLARY SENTINEL LYMPH NODE BIOPSY;  Surgeon: Autumn Messing III, MD;  Location: [facility];  Service: General;  Laterality: Right;   Social History   Tobacco Use  . Smoking status: Never Smoker  . Smokeless tobacco: Never Used  . Tobacco comment: non smoker  Substance Use Topics  . Alcohol use: No    Alcohol/week: 0.0 oz  . Drug use: No   Family History  Problem Relation Age of Onset  . Hypertension Father   . Cancer Father 67       esophageal - smoker  . Leukemia Mother   . Cancer Mother 61       leukemia  . Cancer Brother 77       breast CA  . Cancer Maternal Aunt  80       leukemia  . Cancer Paternal Aunt 14       breast   Allergies  Allergen Reactions  . Amoxicillin Itching    REACTION: reacts opposite effect made infection worse.  Marland Kitchen Amoxicillin-Pot Clavulanate     REACTION: Felt like body turned inside out  . Esomeprazole Magnesium     REACTION: Nausea  . Omeprazole Nausea And Vomiting    REACTION: not effective  . Ranitidine Hcl     REACTION: not effective   Current Outpatient Medications on File Prior to Visit  Medication Sig Dispense Refill  . amitriptyline (ELAVIL) 10 MG tablet TAKE ONE TABLET BY MOUTH AT BEDTIME. MAY INCREASE BY ONE TABLET WEEKLY AS TOLERATED UP TO MAX OF 5 TABS/NIGHT 180 tablet 3  . amLODipine (NORVASC) 5 MG tablet Take 1 tablet (5 mg total) by mouth daily. 30 tablet 11  . aspirin EC 81 MG tablet Take 81 mg by mouth daily.    . benzonatate (TESSALON) 200 MG capsule Take 1 capsule (200 mg total) by mouth 3 (three) times daily as needed for cough. Swallow whole, do not bite pill 30 capsule 1  . calcium carbonate 200 MG capsule Take 250 mg by mouth 2 (two) times daily.    . Cholecalciferol (VITAMIN D PO) Take 1 tablet by mouth 2 (two) times daily. Pt takes 2000 units daily    . fish oil-omega-3 fatty acids 1000 MG capsule Take 1 g by mouth 2 (two) times daily.    . fluticasone (FLONASE) 50 MCG/ACT nasal spray USE 1 SPRAY IN EACH NOSTRIL DAILY AS DIRECTED 16 g 11  . gabapentin (NEURONTIN) 100 MG capsule TAKE ONE CAPSULE BY MOUTH EVERY NIGHT AT BEDTIME 90 capsule 4  . SUMAtriptan (IMITREX) 100 MG tablet TAKE ONE TABLET AS NEEDED FOR MIGRAINE. MAY REPEAT IN TWO HOURS IF NEEDED *MAX OF 2 TABLETS IN 24 HOURS* 9 tablet 11  . tamoxifen (NOLVADEX) 20 MG tablet TAKE 1 TABLET (20 MG TOTAL) BY MOUTH DAILY. 90 tablet 11   No current facility-administered medications on file prior to visit.     Review of Systems  Constitutional: Positive for appetite change. Negative for fatigue and fever.  HENT: Positive for congestion, ear pain,  postnasal drip, rhinorrhea, sinus pressure and sore throat. Negative for nosebleeds.   Eyes: Negative for pain, redness and itching.  Respiratory: Positive for cough. Negative for shortness of breath and wheezing.   Cardiovascular: Negative for chest pain.  Gastrointestinal: Negative for abdominal pain, diarrhea, nausea and vomiting.  Endocrine: Negative for polyuria.  Genitourinary: Negative for dysuria, frequency and urgency.  Musculoskeletal: Negative for arthralgias and  myalgias.  Allergic/Immunologic: Negative for immunocompromised state.  Neurological: Positive for headaches. Negative for dizziness, tremors, syncope, weakness and numbness.  Hematological: Negative for adenopathy. Does not bruise/bleed easily.  Psychiatric/Behavioral: Negative for dysphoric mood. The patient is not nervous/anxious.        Objective:   Physical Exam  Constitutional: She appears well-developed and well-nourished. No distress.  HENT:  Head: Normocephalic and atraumatic.  Right Ear: External ear normal.  Left Ear: External ear normal.  Mouth/Throat: Oropharynx is clear and moist. No oropharyngeal exudate.  Nares are injected and congested  Bilateral maxillary sinus tenderness -much worse on the L  Post nasal drip   Eyes: Conjunctivae and EOM are normal. Pupils are equal, round, and reactive to light. Right eye exhibits no discharge. Left eye exhibits no discharge.  Neck: Normal range of motion. Neck supple. No JVD present. Carotid bruit is not present. No thyromegaly present.  Cardiovascular: Normal rate, regular rhythm, normal heart sounds and intact distal pulses. Exam reveals no gallop.  Pulmonary/Chest: Effort normal and breath sounds normal. No respiratory distress. She has no wheezes. She has no rales.  No crackles  Abdominal: Soft. Bowel sounds are normal. She exhibits no distension, no abdominal bruit and no mass. There is no tenderness.  Musculoskeletal: She exhibits no edema.    Lymphadenopathy:    She has no cervical adenopathy.  Neurological: She is alert. She has normal reflexes. No cranial nerve deficit.  Skin: Skin is warm and dry. No rash noted.  Psychiatric: She has a normal mood and affect.          Assessment & Plan:   Problem List Items Addressed This Visit      Cardiovascular and Mediastinum   Essential hypertension - Primary    bp in fair control at this time  BP Readings from Last 1 Encounters:  05/03/17 125/60   No changes needed Disc lifstyle change with low sodium diet and exercise  Much improved with addn of amlodipine and tolerating well  Lab today for renal insuff        Respiratory   Sinusitis, acute    Briefly improved after zpack- now L sided maxillary pain/focal Still purulent nasal drainage  Repeat the zpak (pcn all)  If not better in 7-10 d may need ENT consult  Disc symptomatic care - see instructions on AVS       Relevant Medications   azithromycin (ZITHROMAX Z-PAK) 250 MG tablet     Genitourinary   Renal insufficiency    bp is now better controlled  Re check renal panel today  Disc fluid intake-aim for 64 oz daily  Avoid nephrotoxic meds       Relevant Orders   Renal function panel

## 2017-05-03 NOTE — Assessment & Plan Note (Signed)
bp in fair control at this time  BP Readings from Last 1 Encounters:  05/03/17 125/60   No changes needed Disc lifstyle change with low sodium diet and exercise  Much improved with addn of amlodipine and tolerating well  Lab today for renal insuff

## 2017-05-03 NOTE — Assessment & Plan Note (Signed)
bp is now better controlled  Re check renal panel today  Disc fluid intake-aim for 64 oz daily  Avoid nephrotoxic meds

## 2017-05-03 NOTE — Patient Instructions (Addendum)
Get 64 oz of fluids per day (mostly water)  This is for kidney health   Blood pressure is improved!  Stay on current medicines   Take zithromax one more course for sinus infection If not better in 10 days please let us know   Lab today   Take care of yourself !

## 2017-05-04 ENCOUNTER — Other Ambulatory Visit: Payer: Self-pay | Admitting: Family Medicine

## 2017-05-04 ENCOUNTER — Other Ambulatory Visit: Payer: Self-pay | Admitting: General Surgery

## 2017-05-04 DIAGNOSIS — Z9889 Other specified postprocedural states: Secondary | ICD-10-CM

## 2017-06-10 ENCOUNTER — Ambulatory Visit
Admission: RE | Admit: 2017-06-10 | Discharge: 2017-06-10 | Disposition: A | Discharge status: Home / Self Care | Payer: Medicare Other | Source: Ambulatory Visit | Attending: General Surgery | Admitting: General Surgery

## 2017-06-10 DIAGNOSIS — R928 Other abnormal and inconclusive findings on diagnostic imaging of breast: Secondary | ICD-10-CM | POA: Diagnosis not present

## 2017-06-10 DIAGNOSIS — Z9889 Other specified postprocedural states: Secondary | ICD-10-CM

## 2017-06-10 HISTORY — DX: Malignant neoplasm of unspecified site of unspecified female breast: C50.919

## 2017-07-08 DIAGNOSIS — L821 Other seborrheic keratosis: Secondary | ICD-10-CM | POA: Diagnosis not present

## 2017-07-08 DIAGNOSIS — D1801 Hemangioma of skin and subcutaneous tissue: Secondary | ICD-10-CM | POA: Diagnosis not present

## 2017-07-08 DIAGNOSIS — D229 Melanocytic nevi, unspecified: Secondary | ICD-10-CM | POA: Diagnosis not present

## 2017-07-08 DIAGNOSIS — L814 Other melanin hyperpigmentation: Secondary | ICD-10-CM | POA: Diagnosis not present

## 2017-07-08 DIAGNOSIS — Z85828 Personal history of other malignant neoplasm of skin: Secondary | ICD-10-CM | POA: Diagnosis not present

## 2017-11-15 DIAGNOSIS — C50411 Malignant neoplasm of upper-outer quadrant of right female breast: Secondary | ICD-10-CM | POA: Diagnosis not present

## 2017-11-23 ENCOUNTER — Other Ambulatory Visit: Payer: Self-pay | Admitting: Family Medicine

## 2017-11-23 NOTE — Telephone Encounter (Signed)
F/u was on 05/03/17, last filled on 11/19/16 #9 tabs with 11 refills, please advise

## 2018-01-02 NOTE — Progress Notes (Signed)
[place]  [phone] [phone]     ID: [name] DOB: 05/25/1940  MR#: [id number]  YTK#:[id number]  Patient Care Team: Abner Greenspan, MD as PCP - Carnot-Moon, Frankclay, MD as Consulting Physician (General Surgery) Aoife Bold, Virgie Dad, MD as Consulting Physician (Oncology) Arloa Koh, MD as Consulting Physician (Radiation Oncology) Rockwell Germany, RN as Registered Nurse Mauro Kaufmann, RN as Registered Nurse Holley Bouche, NP (Inactive) as Nurse Practitioner (Nurse Practitioner) Sylvan Cheese, NP as Nurse Practitioner (Nurse Practitioner) PCP: Abner Greenspan, MD OTHER MD: Nena Polio M.D.  CHIEF COMPLAINT: Estrogen receptor positive breast cancer  CURRENT TREATMENT: tamoxifen   BREAST CANCER HISTORY: From the original intake note:  Gabbrielle had routine bilateral screening mamm[facility]. This found the breast density to be category C. A possible mass was noted in the right breast, and on 05/03/2014 she underwent right diagnostic mammography with ultrasonography. There was a spiculated mass in the upper outer quadrant of the right breast which was new since the prior mammogram. On physical exam there was mild nodularity noted in the area in question. Ultrasound confirmed a hypoechoic irregular mass measuring 0.9 cm in the upper outer quadrant.  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  MRI of the breasts is scheduled for 05/20/2014. The patient's subsequent history is as detailed below  INTERVAL HISTORY: Jadea returns today for follow-up and treatment of her estrogen receptor positive breast  cancer.  She continues on tamoxifen, with good tolerance. She has regular night hot flashes, but she manages them. She denies issues with increased vaginal discharge.  She discontinued taking gabapentin due to having intense and vivid nightmares. She notes that she would wake up in the middle of the night, screaming in terror.   Since her last visit, she underwent diagnostic bilateral mammography with CAD and tomography on 06/10/2017 at [place] showing: breast density category B. There was no evidence of malignancy.    REVIEW OF SYSTEMS: Wendelyn reports that she is having more arthritis in her hands. Her hands cramp and are painful in the morning. She went to an orthopedist about 2 years ago and received cortisone shots, which minimally helped. She is thinking to find a new orthopedist. She denies unusual headaches, visual changes, nausea, vomiting, or dizziness. There has been no unusual cough, phlegm production, or pleurisy. There has been no change in bowel or bladder habits. She denies unexplained fatigue or unexplained weight loss, bleeding, rash, or fever. A detailed review of systems was otherwise stable.    PAST MEDICAL HISTORY: Past Medical History:  Diagnosis Date  . Anxiety   . Arthritis    OA of hands  . Breast cancer ([place])   . Breast cancer of upper-outer quadrant of right female breast ([place]) 05/08/2014  . DVT (deep venous thrombosis) ([place]) 1980s  . Fibromyalgia   . Fibromyalgia   . Hyperlipidemia   . Irritable bowel syndrome   . Migraine   . Osteopenia   . S/P radiation therapy 07/11/2014 through 08/12/2014    Right breast 4250 cGy in 17 sessions, right breast boost 1200 cGy in 6 sessions   . Vitamin D deficiency   .  Wears glasses     PAST SURGICAL HISTORY: Past Surgical History:  Procedure Laterality Date  . ABDOMINAL HYSTERECTOMY  1972  . APPENDECTOMY    . BREAST EXCISIONAL BIOPSY  Left   . BREAST LUMPECTOMY Right 2016  . BREAST SURGERY  1995   lumpectomy fibrocystic breast-lt  . CATARACT EXTRACTION     both  . CHOLECYSTECTOMY  2005  . COLONOSCOPY    . ovarian cyst removed  1985  . RADIOACTIVE SEED GUIDED PARTIAL MASTECTOMY WITH AXILLARY SENTINEL LYMPH NODE BIOPSY Right 05/30/2014   Procedure: RADIOACTIVE SEED GUIDED PARTIAL MASTECTOMY WITH AXILLARY SENTINEL LYMPH NODE BIOPSY;  Surgeon: Autumn Messing III, MD;  Location: [facility];  Service: General;  Laterality: Right;    FAMILY HISTORY Family History  Problem Relation Age of Onset  . Hypertension Father   . Cancer Father 28       esophageal - smoker  . Leukemia Mother   . Cancer Mother 73       leukemia  . Cancer Brother 66       breast CA  . Breast cancer Brother 72  . Cancer Maternal Aunt 80       leukemia  . Cancer Paternal Aunt 34       breast  . Breast cancer Paternal Aunt 54  . Breast cancer Maternal Grandmother 33   the patient's father died at the age of 71 with gastric cancer. The patient's mother was diagnosed with chronic leukemia at age 54 and died from complications of dementia at age 77 the patient has one brother, diagnosed with breast cancer at the age of 67. She has 2 sisters. There is no other history of breast or ovarian cancer in the family.  GYNECOLOGIC HISTORY:  No LMP recorded. Patient has had a hysterectomy. Menarche age 45, first live birth age 71. The patient is GX P2. She underwent total abdominal hysterectomy with bilateral salpingo-oophorectomy 1972. She used hormone replacement for approximately 38 years, until 2011.  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 Her husband Jori Moll is currently not active in the business. Daughter [name], lives in [place], is Pres. The other daughter, [name], lives in [place] where she works as a Landscape architect. The patient has 3  grandchildren. She attends a local [employer] DIRECTIVES: The patient has named both her husband Jori Moll and her daughter Suanne Marker as Equities trader powers of attorney.   HEALTH MAINTENANCE: Social History   Tobacco Use  . Smoking status: Never Smoker  . Smokeless tobacco: Never Used  . Tobacco comment: non smoker  Substance Use Topics  . Alcohol use: No    Alcohol/week: 0.0 standard drinks  . Drug use: No     Colonoscopy:  PAP:  Bone density:  Lipid panel:  Allergies  Allergen Reactions  . Amoxicillin Itching    REACTION: reacts opposite effect made infection worse.  Marland Kitchen Amoxicillin-Pot Clavulanate     REACTION: Felt like body turned inside out  . Esomeprazole Magnesium     REACTION: Nausea  . Omeprazole Nausea And Vomiting    REACTION: not effective  . Ranitidine Hcl     REACTION: not effective    Current Outpatient Medications  Medication Sig Dispense Refill  . amitriptyline (ELAVIL) 10 MG tablet TAKE ONE TABLET BY MOUTH AT BEDTIME. MAY INCREASE BY ONE TABLET WEEKLY AS TOLERATED UP TO MAX OF 5 TABS/NIGHT 180 tablet  3  . amLODipine (NORVASC) 5 MG tablet Take 1 tablet (5 mg total) by mouth daily. 30 tablet 11  . aspirin EC 81 MG tablet Take 81 mg by mouth daily.    Marland Kitchen azithromycin (ZITHROMAX Z-PAK) 250 MG tablet Take 2 pills by mouth today and then 1 pill daily for 4 days 6 tablet 0  . benzonatate (TESSALON) 200 MG capsule Take 1 capsule (200 mg total) by mouth 3 (three) times daily as needed for cough. Swallow whole, do not bite pill 30 capsule 1  . calcium carbonate 200 MG capsule Take 250 mg by mouth 2 (two) times daily.    . Cholecalciferol (VITAMIN D PO) Take 1 tablet by mouth 2 (two) times daily. Pt takes 2000 units daily    . fish oil-omega-3 fatty acids 1000 MG capsule Take 1 g by mouth 2 (two) times daily.    . fluticasone (FLONASE) 50 MCG/ACT nasal spray USE 1 SPRAY IN EACH NOSTRIL DAILY AS DIRECTED 16 g 11  . gabapentin (NEURONTIN) 100 MG  capsule TAKE ONE CAPSULE BY MOUTH EVERY NIGHT AT BEDTIME 90 capsule 4  . SUMAtriptan (IMITREX) 100 MG tablet TAKE 1 TABLET BY MOUTH AS NEEDED FOR MIGRAINE, MAY REPEAT IN 2 HOURS IF NEEDED. MAX 2 TABS IN 24 HOURS. 9 tablet 5  . tamoxifen (NOLVADEX) 20 MG tablet TAKE 1 TABLET (20 MG TOTAL) BY MOUTH DAILY. 90 tablet 11   No current facility-administered medications for this visit.     OBJECTIVE: Older white woman in no acute distress  Vitals:   01/03/18 1020  BP: (!) 145/55  Pulse: 73  Resp: 18  Temp: 98.4 F (36.9 C)  SpO2: 100%     Body mass index is 25.69 kg/m.    ECOG FS:0 - Asymptomatic  Sclerae unicteric, EOMs intact Oropharynx clear and moist No cervical or supraclavicular adenopathy Lungs no rales or rhonchi Heart regular rate and rhythm Abd soft, nontender, positive bowel sounds MSK no focal spinal tenderness, no upper extremity lymphedema Neuro: nonfocal, well oriented, appropriate affect Breasts: The right breast is status post lumpectomy and radiation.  There is no evidence of local recurrence.  The left breast is benign.  Both axillae are benign.  LAB RESULTS:  CMP     Component Value Date/Time   NA 141 05/03/2017 1044   NA 141 02/03/2017 0932   K 4.8 05/03/2017 1044   K 4.8 02/03/2017 0932   CL 105 05/03/2017 1044   CO2 31 05/03/2017 1044   CO2 27 02/03/2017 0932   GLUCOSE 86 05/03/2017 1044   GLUCOSE 89 02/03/2017 0932   BUN 22 05/03/2017 1044   BUN 17.7 02/03/2017 0932   CREATININE 1.18 05/03/2017 1044   CREATININE 1.3 (H) 02/03/2017 0932   CALCIUM 9.2 05/03/2017 1044   CALCIUM 9.4 02/03/2017 0932   PROT 6.5 02/03/2017 0932   ALBUMIN 3.7 05/03/2017 1044   ALBUMIN 3.4 (L) 02/03/2017 0932   AST 19 02/03/2017 0932   ALT 12 02/03/2017 0932   ALKPHOS 42 02/03/2017 0932   BILITOT 0.37 02/03/2017 0932   GFRNONAA 44 (L) 01/04/2017 1535   GFRAA 51 (L) 01/04/2017 1535    INo results found for: SPEP, UPEP  Lab Results  Component Value Date   WBC 5.0  01/03/2018   NEUTROABS 3.0 01/03/2018   HGB 13.6 01/03/2018   HCT 41.6 01/03/2018   MCV 90.8 01/03/2018   PLT 198 01/03/2018      Chemistry      Component Value  Date/Time   NA 141 05/03/2017 1044   NA 141 02/03/2017 0932   K 4.8 05/03/2017 1044   K 4.8 02/03/2017 0932   CL 105 05/03/2017 1044   CO2 31 05/03/2017 1044   CO2 27 02/03/2017 0932   BUN 22 05/03/2017 1044   BUN 17.7 02/03/2017 0932   CREATININE 1.18 05/03/2017 1044   CREATININE 1.3 (H) 02/03/2017 0932      Component Value Date/Time   CALCIUM 9.2 05/03/2017 1044   CALCIUM 9.4 02/03/2017 0932   ALKPHOS 42 02/03/2017 0932   AST 19 02/03/2017 0932   ALT 12 02/03/2017 0932   BILITOT 0.37 02/03/2017 0932       No results found for: LABCA2  No components found for: LABCA125  No results for input(s): INR in the last 168 hours.  Urinalysis    Component Value Date/Time   COLORURINE YELLOW 01/04/2017 1755   APPEARANCEUR CLEAR 01/04/2017 1755   LABSPEC <1.005 (L) 01/04/2017 1755   PHURINE 6.0 01/04/2017 1755   GLUCOSEU NEGATIVE 01/04/2017 1755   HGBUR NEGATIVE 01/04/2017 1755   BILIRUBINUR NEGATIVE 01/04/2017 1755   KETONESUR NEGATIVE 01/04/2017 1755   PROTEINUR NEGATIVE 01/04/2017 1755   NITRITE NEGATIVE 01/04/2017 1755   LEUKOCYTESUR TRACE (A) 01/04/2017 1755    STUDIES: Since her last visit, she underwent diagnostic bilateral mammography with CAD and tomography on 06/10/2017 at [facility] showing: breast density category B. There was no evidence of malignancy.    ASSESSMENT: 77 y.o. BRCA negative [place] woman status post right breast Upper outer quadrant biopsy 05/03/2014 for a clinical T1b N0, stage IA invasive ductal carcinoma, grade 1 or 2, estrogen receptor positive, progesterone receptor negative, with an MIB-1 of 12%, and HER-2/neu equivocal  (1) right lumpectomy and sentinel lymph node sampling 05/30/2014 showed a pT1c pN0, stage IA invasive ductal carcinoma, grade 1, repeat HER-2 again  negative.  (2) genetics testing 05/17/2014 showed no deleterious mutations in ATM, BARD1, BRCA1, BRCA2, BRIP1, CDH1, CHEK2, MRE11A, MUTYH, NBN, NF1, PALB2, PTEN, RAD50, RAD51C, RAD51D, and TP53. Cephus [facility]ing genetics, [facility]Next panel)  (3) Oncotype score of 25 predicts a risk of distant recurrence of 16% if the patient's only systemic treatment is tamoxifen for 5 years; it also predicts a benefit from chemotherapy in the 5% range. Given this marginal benefit the patient opted against adjuvant chemotherapy  (4) adjuvant radiation completed 08/12/2014: Right breast 4250 cGy in 17 sessions, right breast boost 1200 cGy in 6 sessions  (5) started tamoxifen 10/10/2014  (a) status post hysterectomy and bilateral salpingo-oophorectomy in 1972  PLAN: Milaina is now 3-1/2 years out from definitive surgery for breast cancer with no evidence of disease recurrence.  This is very favorable.  She is tolerating tamoxifen well and the plan will be to continue that for 5 years.  She was not able to tolerate the gabapentin.  I have added that to her list of allergies although it is truly an intolerance.  I have encouraged her to continue her exercise program and be as active as possible  She knows to call for any other issues that may develop before the next visit.  Nickoles Gregori, Virgie Dad, MD  01/03/18 10:35 AM Medical Oncology and Hematology [facility] [address] [place], [place] [address] Tel. [phone]    Fax. [phone]  Alice Rieger, am acting as scribe for Chauncey Cruel MD.  I, Lurline Del MD, have reviewed the above documentation for accuracy and completeness, and I agree with the above.

## 2018-01-03 ENCOUNTER — Telehealth: Payer: Self-pay | Admitting: Oncology

## 2018-01-03 ENCOUNTER — Inpatient Hospital Stay: Payer: Medicare Other | Attending: Oncology | Admitting: Oncology

## 2018-01-03 ENCOUNTER — Inpatient Hospital Stay: Payer: Medicare Other

## 2018-01-03 VITALS — BP 145/55 | HR 73 | Temp 98.4°F | Resp 18 | Ht 64.5 in | Wt 152.0 lb

## 2018-01-03 DIAGNOSIS — N951 Menopausal and female climacteric states: Secondary | ICD-10-CM | POA: Diagnosis not present

## 2018-01-03 DIAGNOSIS — Z170 Estrogen receptor positive status [ER+]: Secondary | ICD-10-CM | POA: Insufficient documentation

## 2018-01-03 DIAGNOSIS — M19042 Primary osteoarthritis, left hand: Secondary | ICD-10-CM

## 2018-01-03 DIAGNOSIS — Z79899 Other long term (current) drug therapy: Secondary | ICD-10-CM | POA: Diagnosis not present

## 2018-01-03 DIAGNOSIS — M19041 Primary osteoarthritis, right hand: Secondary | ICD-10-CM | POA: Diagnosis not present

## 2018-01-03 DIAGNOSIS — C50411 Malignant neoplasm of upper-outer quadrant of right female breast: Secondary | ICD-10-CM

## 2018-01-03 LAB — COMPREHENSIVE METABOLIC PANEL
ALBUMIN: 3.6 g/dL (ref 3.5–5.0)
ALK PHOS: 43 U/L (ref 38–126)
ALT: 15 U/L (ref 0–44)
AST: 22 U/L (ref 15–41)
Anion gap: 9 (ref 5–15)
BILIRUBIN TOTAL: 0.5 mg/dL (ref 0.3–1.2)
BUN: 19 mg/dL (ref 8–23)
CALCIUM: 10.1 mg/dL (ref 8.9–10.3)
CO2: 29 mmol/L (ref 22–32)
CREATININE: 1.31 mg/dL — AB (ref 0.44–1.00)
Chloride: 103 mmol/L (ref 98–111)
GFR calc Af Amer: 44 mL/min — ABNORMAL LOW (ref >60)
GFR calc non Af Amer: 38 mL/min — ABNORMAL LOW (ref >60)
GLUCOSE: 79 mg/dL (ref 70–99)
Potassium: 4.2 mmol/L (ref 3.5–5.1)
Sodium: 141 mmol/L (ref 135–145)
TOTAL PROTEIN: 6.9 g/dL (ref 6.5–8.1)

## 2018-01-03 LAB — CBC WITH DIFFERENTIAL/PLATELET
Abs Immature Granulocytes: 0.01 10*3/uL (ref 0.00–0.07)
BASOS PCT: 1 %
Basophils Absolute: 0.1 10*3/uL (ref 0.0–0.1)
EOS ABS: 0.1 10*3/uL (ref 0.0–0.5)
EOS PCT: 1 %
HCT: 41.6 % (ref 36.0–46.0)
Hemoglobin: 13.6 g/dL (ref 12.0–15.0)
Immature Granulocytes: 0 %
Lymphocytes Relative: 28 %
Lymphs Abs: 1.4 10*3/uL (ref 0.7–4.0)
MCH: 29.7 pg (ref 26.0–34.0)
MCHC: 32.7 g/dL (ref 30.0–36.0)
MCV: 90.8 fL (ref 80.0–100.0)
MONO ABS: 0.5 10*3/uL (ref 0.1–1.0)
MONOS PCT: 9 %
NEUTROS PCT: 61 %
Neutro Abs: 3 10*3/uL (ref 1.7–7.7)
PLATELETS: 198 10*3/uL (ref 150–400)
RBC: 4.58 MIL/uL (ref 3.87–5.11)
RDW: 14 % (ref 11.5–15.5)
WBC: 5 10*3/uL (ref 4.0–10.5)
nRBC: 0 % (ref 0.0–0.2)

## 2018-01-03 MED ORDER — TAMOXIFEN CITRATE 20 MG PO TABS: ORAL_TABLET | ORAL | 11 refills | Status: DC

## 2018-01-03 NOTE — Telephone Encounter (Signed)
Gave patient avs and calendar.   °

## 2018-03-06 DIAGNOSIS — M79641 Pain in right hand: Secondary | ICD-10-CM | POA: Diagnosis not present

## 2018-03-06 DIAGNOSIS — G5602 Carpal tunnel syndrome, left upper limb: Secondary | ICD-10-CM | POA: Diagnosis not present

## 2018-03-06 DIAGNOSIS — G5601 Carpal tunnel syndrome, right upper limb: Secondary | ICD-10-CM | POA: Diagnosis not present

## 2018-03-06 DIAGNOSIS — M79642 Pain in left hand: Secondary | ICD-10-CM | POA: Diagnosis not present

## 2018-03-06 DIAGNOSIS — G5603 Carpal tunnel syndrome, bilateral upper limbs: Secondary | ICD-10-CM | POA: Diagnosis not present

## 2018-03-16 DIAGNOSIS — H26491 Other secondary cataract, right eye: Secondary | ICD-10-CM | POA: Diagnosis not present

## 2018-03-16 DIAGNOSIS — H26493 Other secondary cataract, bilateral: Secondary | ICD-10-CM | POA: Diagnosis not present

## 2018-03-16 DIAGNOSIS — H35363 Drusen (degenerative) of macula, bilateral: Secondary | ICD-10-CM | POA: Diagnosis not present

## 2018-03-16 DIAGNOSIS — Z961 Presence of intraocular lens: Secondary | ICD-10-CM | POA: Diagnosis not present

## 2018-03-27 ENCOUNTER — Other Ambulatory Visit: Payer: Self-pay | Admitting: Family Medicine

## 2018-03-30 DIAGNOSIS — H26492 Other secondary cataract, left eye: Secondary | ICD-10-CM | POA: Diagnosis not present

## 2018-04-27 ENCOUNTER — Other Ambulatory Visit: Payer: Self-pay | Admitting: Family Medicine

## 2018-04-28 DIAGNOSIS — G5602 Carpal tunnel syndrome, left upper limb: Secondary | ICD-10-CM | POA: Diagnosis not present

## 2018-05-02 ENCOUNTER — Ambulatory Visit (INDEPENDENT_AMBULATORY_CARE_PROVIDER_SITE_OTHER): Payer: Medicare Other | Admitting: Family Medicine

## 2018-05-02 ENCOUNTER — Encounter: Payer: Self-pay | Admitting: Family Medicine

## 2018-05-02 VITALS — BP 116/70 | HR 66 | Temp 97.4°F | Ht 64.5 in | Wt 151.2 lb

## 2018-05-02 DIAGNOSIS — E782 Mixed hyperlipidemia: Secondary | ICD-10-CM | POA: Diagnosis not present

## 2018-05-02 DIAGNOSIS — E559 Vitamin D deficiency, unspecified: Secondary | ICD-10-CM

## 2018-05-02 DIAGNOSIS — N289 Disorder of kidney and ureter, unspecified: Secondary | ICD-10-CM

## 2018-05-02 DIAGNOSIS — C50411 Malignant neoplasm of upper-outer quadrant of right female breast: Secondary | ICD-10-CM

## 2018-05-02 DIAGNOSIS — M810 Age-related osteoporosis without current pathological fracture: Secondary | ICD-10-CM | POA: Insufficient documentation

## 2018-05-02 DIAGNOSIS — M8580 Other specified disorders of bone density and structure, unspecified site: Secondary | ICD-10-CM | POA: Insufficient documentation

## 2018-05-02 DIAGNOSIS — I10 Essential (primary) hypertension: Secondary | ICD-10-CM | POA: Diagnosis not present

## 2018-05-02 DIAGNOSIS — M8589 Other specified disorders of bone density and structure, multiple sites: Secondary | ICD-10-CM

## 2018-05-02 DIAGNOSIS — Z170 Estrogen receptor positive status [ER+]: Secondary | ICD-10-CM

## 2018-05-02 LAB — LIPID PANEL
Cholesterol: 200 mg/dL (ref 0–200)
HDL: 63.2 mg/dL (ref >39.00)
LDL Cholesterol: 114 mg/dL — ABNORMAL HIGH (ref 0–99)
NonHDL: 136.62
Total CHOL/HDL Ratio: 3
Triglycerides: 115 mg/dL (ref 0.0–149.0)
VLDL: 23 mg/dL (ref 0.0–40.0)

## 2018-05-02 LAB — TSH: TSH: 2.98 u[IU]/mL (ref 0.35–4.50)

## 2018-05-02 LAB — CBC WITH DIFFERENTIAL/PLATELET
Basophils Absolute: 0.1 10*3/uL (ref 0.0–0.1)
Basophils Relative: 1.1 % (ref 0.0–3.0)
EOS PCT: 1.2 % (ref 0.0–5.0)
Eosinophils Absolute: 0.1 10*3/uL (ref 0.0–0.7)
HCT: 39.5 % (ref 36.0–46.0)
Hemoglobin: 13.1 g/dL (ref 12.0–15.0)
Lymphocytes Relative: 22.5 % (ref 12.0–46.0)
Lymphs Abs: 1.3 10*3/uL (ref 0.7–4.0)
MCHC: 33.2 g/dL (ref 30.0–36.0)
MCV: 90.1 fl (ref 78.0–100.0)
MONO ABS: 0.5 10*3/uL (ref 0.1–1.0)
Monocytes Relative: 8.6 % (ref 3.0–12.0)
Neutro Abs: 3.9 10*3/uL (ref 1.4–7.7)
Neutrophils Relative %: 66.6 % (ref 43.0–77.0)
Platelets: 201 10*3/uL (ref 150.0–400.0)
RBC: 4.38 Mil/uL (ref 3.87–5.11)
RDW: 13.9 % (ref 11.5–15.5)
WBC: 5.8 10*3/uL (ref 4.0–10.5)

## 2018-05-02 LAB — COMPREHENSIVE METABOLIC PANEL
ALT: 14 U/L (ref 0–35)
AST: 20 U/L (ref 0–37)
Albumin: 3.6 g/dL (ref 3.5–5.2)
Alkaline Phosphatase: 37 U/L — ABNORMAL LOW (ref 39–117)
BUN: 21 mg/dL (ref 6–23)
CO2: 25 mEq/L (ref 19–32)
Calcium: 9 mg/dL (ref 8.4–10.5)
Chloride: 106 mEq/L (ref 96–112)
Creatinine, Ser: 1.17 mg/dL (ref 0.40–1.20)
GFR: 44.74 mL/min — ABNORMAL LOW (ref >60.00)
Glucose, Bld: 92 mg/dL (ref 70–99)
Potassium: 4.1 mEq/L (ref 3.5–5.1)
SODIUM: 139 meq/L (ref 135–145)
Total Bilirubin: 0.3 mg/dL (ref 0.2–1.2)
Total Protein: 6.2 g/dL (ref 6.0–8.3)

## 2018-05-02 LAB — VITAMIN D 25 HYDROXY (VIT D DEFICIENCY, FRACTURES): VITD: 26.17 ng/mL — ABNORMAL LOW (ref 30.00–100.00)

## 2018-05-02 MED ORDER — FLUTICASONE PROPIONATE 50 MCG/ACT NA SUSP: NASAL | 11 refills | Status: DC

## 2018-05-02 MED ORDER — AMITRIPTYLINE HCL 10 MG PO TABS: 20.0000 mg | ORAL_TABLET | Freq: Every day | ORAL | 11 refills | Status: DC

## 2018-05-02 MED ORDER — AMLODIPINE BESYLATE 5 MG PO TABS: 5.0000 mg | ORAL_TABLET | Freq: Every day | ORAL | 11 refills | Status: DC

## 2018-05-02 NOTE — Patient Instructions (Signed)
Keep your brain active /and your body  Take good care of yourself in general  Keep walking  Continue your vitamin D and calcium   Labs today

## 2018-05-02 NOTE — Assessment & Plan Note (Signed)
Disc goals for lipids and reasons to control them Rev last labs with pt Rev low sat fat diet in detail Due for labs Good diet

## 2018-05-02 NOTE — Assessment & Plan Note (Signed)
bp in fair control at this time  BP Readings from Last 1 Encounters:  05/02/18 116/70   No changes needed Most recent labs reviewed  Disc lifstyle change with low sodium diet and exercise  Labs today

## 2018-05-02 NOTE — Assessment & Plan Note (Signed)
Due for labs Enc water intake Avoid nsaids

## 2018-05-02 NOTE — Assessment & Plan Note (Signed)
With osteopenia Labs today  Stressed imp to bone and overall health

## 2018-05-02 NOTE — Assessment & Plan Note (Signed)
dexa 10/18 On tamoxifen On ca and D No falls/fx  Continue to follow

## 2018-05-02 NOTE — Progress Notes (Signed)
Subjective:    Patient ID: Sarah Young, female    DOB: April 06, 1940, 78 y.o.   MRN: [id number]  HPI Here for f/u of chronic health problems   Had carpal tunnel sx on L hand - will have in R next   In general doing well  Today is her bday     Wt Readings from Last 3 Encounters:  05/02/18 151 lb 4 oz (68.6 kg)  01/03/18 152 lb (68.9 kg)  05/03/17 152 lb 12 oz (69.3 kg)  good weight  25.56 kg/m   Had flu shot 11/19   bp is stable today  No cp or palpitations or headaches or edema  No side effects to medicines  BP Readings from Last 3 Encounters:  05/02/18 116/70  01/03/18 (!) 145/55  05/03/17 125/60     H/o renal insuf in the past Lab Results  Component Value Date   CREATININE 1.31 (H) 01/03/2018   BUN 19 01/03/2018   NA 141 01/03/2018   K 4.2 01/03/2018   CL 103 01/03/2018   CO2 29 01/03/2018    Due for labs  Tries to drink water - it does make her urinate a lot  It had to get in 64 oz   Hyperlipidemia Lab Results  Component Value Date   CHOL 193 11/12/2016   HDL 68.80 11/12/2016   LDLCALC 102 (H) 11/12/2016   LDLDIRECT 143.9 04/20/2013   TRIG 108.0 11/12/2016   CHOLHDL 3 11/12/2016   Due for labs  Has been well controlled with diet  Still eating very healthy  No red meat  Not a lot of fatty/fried food  Walks for exercise- great HDL   Personal hx of breast cancer (both brother and sister have had breast cancer as well)  She did have genetic testing  Takes tamoxifen-doing well  Continues regular oncology f/u  Has had a hysterectomy    Tetanus shot -not utd / medicare   dexa 10/18 -osteopenia  Taking her D and calcium No falls or fractures   Patient Active Problem List   Diagnosis Date Noted  . Osteopenia 05/02/2018  . Routine general medical examination at a health care facility 11/21/2016  . Estrogen deficiency 11/19/2016  . Renal insufficiency 11/19/2016  . Pedal edema 12/15/2015  . Family history of breast cancer in female 05/17/2014    . Malignant neoplasm of upper-outer quadrant of right breast in female, estrogen receptor positive ([place]) 05/08/2014  . GERD (gastroesophageal reflux disease) 03/01/2014  . Lumbar degenerative disc disease 08/15/2013  . Chronic pain of scapula 08/15/2013  . Essential hypertension 10/02/2008  . Migraine 09/06/2008  . Vitamin D deficiency 06/27/2007  . HYPERLIPIDEMIA 05/11/2007  . Generalized anxiety disorder 05/11/2007  . Disorder of bone and cartilage 05/11/2007  . H/O poliomyelitis 05/08/2007  . OSTEOARTHRITIS, HANDS, BILATERAL 05/08/2007  . Fibromyalgia 05/08/2007  . URINARY INCONTINENCE, STRESS, MILD 05/08/2007   Past Medical History:  Diagnosis Date  . Anxiety   . Arthritis    OA of hands  . Breast cancer ([place])   . Breast cancer of upper-outer quadrant of right female breast ([place]) 05/08/2014  . DVT (deep venous thrombosis) ([place]) 1980s  . Fibromyalgia   . Fibromyalgia   . Hyperlipidemia   . Irritable bowel syndrome   . Migraine   . Osteopenia   . S/P radiation therapy 07/11/2014 through 08/12/2014    Right breast 4250 cGy in 17 sessions, right breast boost 1200 cGy in 6 sessions   . Vitamin D  deficiency   . Wears glasses    Past Surgical History:  Procedure Laterality Date  . ABDOMINAL HYSTERECTOMY  1972  . APPENDECTOMY    . BREAST EXCISIONAL BIOPSY Left   . BREAST LUMPECTOMY Right 2016  . BREAST SURGERY  1995   lumpectomy fibrocystic breast-lt  . CATARACT EXTRACTION     both  . CHOLECYSTECTOMY  2005  . COLONOSCOPY    . ovarian cyst removed  1985  . RADIOACTIVE SEED GUIDED PARTIAL MASTECTOMY WITH AXILLARY SENTINEL LYMPH NODE BIOPSY Right 05/30/2014   Procedure: RADIOACTIVE SEED GUIDED PARTIAL MASTECTOMY WITH AXILLARY SENTINEL LYMPH NODE BIOPSY;  Surgeon: Autumn Messing III, MD;  Location: [facility];  Service: General;  Laterality: Right;   Social History   Tobacco Use  . Smoking  status: Never Smoker  . Smokeless tobacco: Never Used  . Tobacco comment: non smoker  Substance Use Topics  . Alcohol use: No    Alcohol/week: 0.0 standard drinks  . Drug use: No   Family History  Problem Relation Age of Onset  . Hypertension Father   . Cancer Father 51       esophageal - smoker  . Leukemia Mother   . Cancer Mother 34       leukemia  . Cancer Brother 66       breast CA  . Breast cancer Brother 64  . Cancer Maternal Aunt 80       leukemia  . Cancer Paternal Aunt 9       breast  . Breast cancer Paternal Aunt 61  . Breast cancer Maternal Grandmother 80   Allergies  Allergen Reactions  . Amoxicillin Itching    REACTION: reacts opposite effect made infection worse.  Marland Kitchen Amoxicillin-Pot Clavulanate     REACTION: Felt like body turned inside out  . Esomeprazole Magnesium     REACTION: Nausea  . Gabapentin   . Omeprazole Nausea And Vomiting    REACTION: not effective  . Ranitidine Hcl     REACTION: not effective   Current Outpatient Medications on File Prior to Visit  Medication Sig Dispense Refill  . aspirin EC 81 MG tablet Take 81 mg by mouth daily.    . calcium carbonate 200 MG capsule Take 250 mg by mouth 2 (two) times daily.    . Cholecalciferol (VITAMIN D PO) Take 1 tablet by mouth 2 (two) times daily. Pt takes 2000 units daily    . fish oil-omega-3 fatty acids 1000 MG capsule Take 1 g by mouth 2 (two) times daily.    Marland Kitchen gabapentin (NEURONTIN) 100 MG capsule TAKE ONE CAPSULE BY MOUTH EVERY NIGHT AT BEDTIME 90 capsule 4  . SUMAtriptan (IMITREX) 100 MG tablet TAKE 1 TABLET BY MOUTH AS NEEDED FOR MIGRAINE, MAY REPEAT IN 2 HOURS IF NEEDED. MAX 2 TABS IN 24 HOURS. 9 tablet 5  . tamoxifen (NOLVADEX) 20 MG tablet Take one tablet daily 90 tablet 11   No current facility-administered medications on file prior to visit.     Review of Systems  Constitutional: Negative for activity change, appetite change, fatigue, fever and unexpected weight change.  HENT:  Negative for congestion, ear pain, rhinorrhea, sinus pressure and sore throat.   Eyes: Negative for pain, redness and visual disturbance.  Respiratory: Negative for cough, shortness of breath and wheezing.   Cardiovascular: Negative for chest pain and palpitations.  Gastrointestinal: Negative for abdominal pain, blood in stool, constipation and diarrhea.  Endocrine: Negative for polydipsia and polyuria.  Genitourinary:  Negative for dysuria, frequency and urgency.  Musculoskeletal: Negative for arthralgias, back pain and myalgias.  Skin: Negative for pallor and rash.  Allergic/Immunologic: Negative for environmental allergies.  Neurological: Positive for numbness. Negative for dizziness, syncope and headaches.       Recent carpal tunnel surgery on L [place] on R  Pain and numbness in hand  Hematological: Negative for adenopathy. Does not bruise/bleed easily.  Psychiatric/Behavioral: Negative for decreased concentration and dysphoric mood. The patient is not nervous/anxious.        Objective:   Physical Exam Constitutional:      General: She is not in acute distress.    Appearance: Normal appearance. She is well-developed and normal weight. She is not ill-appearing.  HENT:     Head: Normocephalic and atraumatic.     Mouth/Throat:     Mouth: Mucous membranes are moist.     Pharynx: Oropharynx is clear.  Eyes:     Conjunctiva/sclera: Conjunctivae normal.     Pupils: Pupils are equal, round, and reactive to light.  Neck:     Musculoskeletal: Normal range of motion and neck supple.     Thyroid: No thyromegaly.     Vascular: No carotid bruit or JVD.  Cardiovascular:     Rate and Rhythm: Normal rate and regular rhythm.     Pulses: Normal pulses.     Heart sounds: Normal heart sounds. No gallop.   Pulmonary:     Effort: Pulmonary effort is normal. No respiratory distress.     Breath sounds: Normal breath sounds. No wheezing or rales.  Abdominal:     General: Bowel sounds are  normal. There is no distension or abdominal bruit.     Palpations: Abdomen is soft. There is no mass.     Tenderness: There is no abdominal tenderness.  Musculoskeletal:     Right lower leg: No edema.     Left lower leg: No edema.     Comments: Dressing on L hand  No kyphosis   Lymphadenopathy:     Cervical: No cervical adenopathy.  Skin:    General: Skin is warm and dry.     Capillary Refill: Capillary refill takes less than 2 seconds.     Findings: No rash.  Neurological:     Mental Status: She is alert.     Coordination: Coordination normal.     Deep Tendon Reflexes: Reflexes are normal and symmetric. Reflexes normal.  Psychiatric:        Mood and Affect: Mood normal.           Assessment & Plan:   Problem List Items Addressed This Visit      Cardiovascular and Mediastinum   Essential hypertension - Primary    bp in fair control at this time  BP Readings from Last 1 Encounters:  05/02/18 116/70   No changes needed Most recent labs reviewed  Disc lifstyle change with low sodium diet and exercise  Labs today       Relevant Medications   amLODipine (NORVASC) 5 MG tablet   Other Relevant Orders   CBC with Differential/Platelet (Completed)   Comprehensive metabolic panel (Completed)   Lipid panel (Completed)   TSH (Completed)     Musculoskeletal and Integument   Osteopenia    dexa 10/18 On tamoxifen On ca and D No falls/fx  Continue to follow        Genitourinary   Renal insufficiency    Due for labs Enc water intake Avoid nsaids  Relevant Orders   Comprehensive metabolic panel (Completed)     Other   Vitamin D deficiency    With osteopenia Labs today  Stressed imp to bone and overall health       Relevant Orders   VITAMIN D 25 Hydroxy (Vit-D Deficiency, Fractures) (Completed)   HYPERLIPIDEMIA    Disc goals for lipids and reasons to control them Rev last labs with pt Rev low sat fat diet in detail Due for labs Good diet        Relevant Medications   amLODipine (NORVASC) 5 MG tablet   Other Relevant Orders   Lipid panel (Completed)   Malignant neoplasm of upper-outer quadrant of right breast in female, estrogen receptor positive ([place])    On tamoxifen with regular oncol f/u Doing well  Pending more family genetic testing

## 2018-05-02 NOTE — Assessment & Plan Note (Signed)
On tamoxifen with regular oncol f/u Doing well  Pending more family genetic testing

## 2018-05-11 DIAGNOSIS — M25632 Stiffness of left wrist, not elsewhere classified: Secondary | ICD-10-CM | POA: Diagnosis not present

## 2018-05-15 ENCOUNTER — Other Ambulatory Visit: Payer: Self-pay | Admitting: Oncology

## 2018-05-15 DIAGNOSIS — Z9889 Other specified postprocedural states: Secondary | ICD-10-CM

## 2018-08-14 ENCOUNTER — Ambulatory Visit
Admission: RE | Admit: 2018-08-14 | Discharge: 2018-08-14 | Disposition: A | Discharge status: Home / Self Care | Payer: Medicare Other | Source: Ambulatory Visit | Attending: Oncology | Admitting: Oncology

## 2018-08-14 ENCOUNTER — Other Ambulatory Visit: Payer: Self-pay

## 2018-08-14 DIAGNOSIS — Z9889 Other specified postprocedural states: Secondary | ICD-10-CM

## 2018-08-14 DIAGNOSIS — R928 Other abnormal and inconclusive findings on diagnostic imaging of breast: Secondary | ICD-10-CM | POA: Diagnosis not present

## 2018-08-23 DIAGNOSIS — C50411 Malignant neoplasm of upper-outer quadrant of right female breast: Secondary | ICD-10-CM | POA: Diagnosis not present

## 2018-10-09 DIAGNOSIS — D3131 Benign neoplasm of right choroid: Secondary | ICD-10-CM | POA: Diagnosis not present

## 2018-10-09 DIAGNOSIS — H43393 Other vitreous opacities, bilateral: Secondary | ICD-10-CM | POA: Diagnosis not present

## 2018-10-09 DIAGNOSIS — H35373 Puckering of macula, bilateral: Secondary | ICD-10-CM | POA: Diagnosis not present

## 2018-10-09 DIAGNOSIS — H35363 Drusen (degenerative) of macula, bilateral: Secondary | ICD-10-CM | POA: Diagnosis not present

## 2018-12-11 ENCOUNTER — Other Ambulatory Visit: Payer: Self-pay | Admitting: Family Medicine

## 2018-12-12 NOTE — Telephone Encounter (Signed)
Last OV was a f/u on 05/02/18 (no future appts), last filled on 11/23/17 #9 tabs with 5 refills

## 2019-01-09 NOTE — Progress Notes (Signed)
[facility]  [phone] [phone]     ID: Sarah Young DOB: 1940/05/18  MR#: [id number]  GBT#:[id number]  Patient Care Team: Abner Greenspan, MD as PCP - Standish, Arco, MD as Consulting Physician (General Surgery) , Virgie Dad, MD as Consulting Physician (Oncology) Arloa Koh, MD (Inactive) as Consulting Physician (Radiation Oncology) Rockwell Germany, RN as Registered Nurse Mauro Kaufmann, RN as Registered Nurse Holley Bouche, NP (Inactive) as Nurse Practitioner (Nurse Practitioner) Sylvan Cheese, NP as Nurse Practitioner (Nurse Practitioner) PCP: Abner Greenspan, MD OTHER MD: Nena Polio M.D.  CHIEF COMPLAINT: Estrogen receptor positive breast cancer  CURRENT TREATMENT: tamoxifen   BREAST CANCER HISTORY: From the original intake note:  Sarah Young had routine bilateral screening [facility]. This found the breast density to be category C. A possible mass was noted in the right breast, and on 05/03/2014 she underwent right diagnostic mammography with ultrasonography. There was a spiculated mass in the upper outer quadrant of the right breast which was new since the prior mammogram. On physical exam there was mild nodularity noted in the area in question. Ultrasound confirmed a hypoechoic irregular mass measuring 0.9 cm in the upper outer quadrant.  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  MRI of the breasts is scheduled for 05/20/2014. The patient's subsequent history is as detailed below   INTERVAL HISTORY: Sarah Young returns today for follow-up and treatment of her estrogen receptor  positive breast cancer. She was last seen here on 01/03/2018.   She continues on tamoxifen.  She tolerates this well, with no significant problems with hot flashes or vaginal wetness.  Since her last visit here, she underwent a digital diagnostic bilateral mammogram with tomography on 08/04/2018 showing: Breast Density Category B. There is no mammographic evidence of malignancy.    REVIEW OF SYSTEMS: Sarah Young is very concerned because in addition to her brother who had breast cancer, she now has a sister with breast cancer.  Her brother also was tested for gene and it was not found.  What this means of course is that there is a likely multi gene combination causing significantly increased breast cancer risk in this family and we are just not smart enough to find it.  She is walking for exercise.  She is keeping appropriate pandemic precautions.  She has significant arthritis problems.  A detailed review of systems today was otherwise not significant.    PAST MEDICAL HISTORY: Past Medical History:  Diagnosis Date  . Anxiety   . Arthritis    OA of hands  . Breast cancer ([place])   . Breast cancer of upper-outer quadrant of right female breast ([place]) 05/08/2014  . DVT (deep venous thrombosis) ([place]) 1980s  . Fibromyalgia   . Fibromyalgia   . Hyperlipidemia   . Irritable bowel syndrome   . Migraine   . Osteopenia   . S/P radiation therapy 07/11/2014 through 08/12/2014    Right breast 4250 cGy in 17 sessions, right breast boost 1200 cGy in 6 sessions   . Vitamin D deficiency   . Wears glasses     PAST SURGICAL HISTORY: Past Surgical History:  Procedure Laterality Date  .  ABDOMINAL HYSTERECTOMY  1972  . APPENDECTOMY    . BREAST EXCISIONAL BIOPSY Left   . BREAST LUMPECTOMY Right 2016  . BREAST SURGERY  1995   lumpectomy fibrocystic breast-lt  . CATARACT EXTRACTION     both  . CHOLECYSTECTOMY  2005  . COLONOSCOPY     . ovarian cyst removed  1985  . RADIOACTIVE SEED GUIDED PARTIAL MASTECTOMY WITH AXILLARY SENTINEL LYMPH NODE BIOPSY Right 05/30/2014   Procedure: RADIOACTIVE SEED GUIDED PARTIAL MASTECTOMY WITH AXILLARY SENTINEL LYMPH NODE BIOPSY;  Surgeon: Autumn Messing III, MD;  Location: [facility];  Service: General;  Laterality: Right;    FAMILY HISTORY Family History  Problem Relation Age of Onset  . Hypertension Father   . Cancer Father 52       esophageal - smoker  . Leukemia Mother   . Cancer Mother 83       leukemia  . Cancer Brother 57       breast CA  . Breast cancer Brother 78  . Cancer Maternal Aunt 80       leukemia  . Cancer Paternal Aunt 77       breast  . Breast cancer Paternal Aunt 41  . Breast cancer Maternal Grandmother 29   the patient's father died at the age of 19 with gastric cancer. The patient's mother was diagnosed with chronic leukemia at age 9 and died from complications of dementia at age 15 the patient has one brother, diagnosed with breast cancer at the age of 41. She has 2 sisters. There is no other history of breast or ovarian cancer in the family.  GYNECOLOGIC HISTORY:  No LMP recorded. Patient has had a hysterectomy. Menarche age 27, first live birth age 55. The patient is GX P2. She underwent total abdominal hysterectomy with bilateral salpingo-oophorectomy 1972. She used hormone replacement for approximately 38 years, until 2011.  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 Her husband Jori Moll is currently not active in the business. Daughter Sarah Young, lives in [place], is Pres. The other daughter, Sarah Young, lives in [place] where she works as a Landscape architect. The patient has 3 grandchildren. She attends a local [employer] DIRECTIVES: The patient has named both her husband Jori Moll and her daughter Suanne Marker as Equities trader powers of attorney.    HEALTH MAINTENANCE: Social History   Tobacco Use  . Smoking status: Never Smoker  . Smokeless tobacco: Never Used  . Tobacco comment: non smoker  Substance Use Topics  . Alcohol use: No    Alcohol/week: 0.0 standard drinks  . Drug use: No     Colonoscopy:  PAP:  Bone density:  Lipid panel:  Allergies  Allergen Reactions  . Amoxicillin Itching    REACTION: reacts opposite effect made infection worse.  Marland Kitchen Amoxicillin-Pot Clavulanate     REACTION: Felt like body turned inside out  . Esomeprazole Magnesium     REACTION: Nausea  . Gabapentin   . Omeprazole Nausea And Vomiting    REACTION: not effective  . Ranitidine Hcl     REACTION: not effective    Current Outpatient Medications  Medication Sig Dispense Refill  . amitriptyline (ELAVIL) 10 MG tablet Take 2 tablets (20 mg total) by mouth at bedtime. 60 tablet 11  . amLODipine (NORVASC) 5 MG tablet Take 1 tablet (5 mg total) by mouth daily. Needs an appt before future refills are given  30 tablet 11  . aspirin EC 81 MG tablet Take 81 mg by mouth daily.    . calcium carbonate 200 MG capsule Take 250 mg by mouth 2 (two) times daily.    . Cholecalciferol (VITAMIN D PO) Take 1 tablet by mouth 2 (two) times daily. Pt takes 2000 units daily    . fish oil-omega-3 fatty acids 1000 MG capsule Take 1 g by mouth 2 (two) times daily.    . fluticasone (FLONASE) 50 MCG/ACT nasal spray USE 1 SPRAY IN EACH NOSTRIL DAILY AS DIRECTED 16 g 11  . gabapentin (NEURONTIN) 100 MG capsule TAKE ONE CAPSULE BY MOUTH EVERY NIGHT AT BEDTIME 90 capsule 4  . SUMAtriptan (IMITREX) 100 MG tablet TAKE 1 TABLET BY MOUTH AS NEEDED FOR MIGRAINE, MAY REPEAT IN 2 HOURS IF NEEDED. MAX 2 TABS IN 24 HOURS. 9 tablet 3  . tamoxifen (NOLVADEX) 20 MG tablet Take one tablet daily 90 tablet 11   No current facility-administered medications for this visit.     OBJECTIVE: Older white woman who appears stated age  78:   01/10/19 1129  BP: (!) 143/60  Pulse: 75   Resp: 18  Temp: 98.3 F (36.8 C)  SpO2: 100%   Wt Readings from Last 3 Encounters:  01/10/19 150 lb 6.4 oz (68.2 kg)  05/02/18 151 lb 4 oz (68.6 kg)  01/03/18 152 lb (68.9 kg)   Body mass index is 25.42 kg/m.    ECOG FS:1 - Symptomatic but completely ambulatory  Ocular: Sclerae unicteric, pupils round and equal Ear-nose-throat: Wearing a mask Lymphatic: No cervical or supraclavicular adenopathy Lungs no rales or rhonchi Heart regular rate and rhythm Abd soft, nontender, positive bowel sounds MSK no focal spinal tenderness, no joint edema, obvious arthritis involving the right hand more than the left, with carpal tunnel surgery scar left wrist Neuro: non-focal, well-oriented, appropriate affect Breasts: The right breast is undergone lumpectomy and radiation.  The cosmetic result is good.  There is no disease recurrence by exam.  Left breast is benign.  Both axillae are benign.  LAB RESULTS:  CMP     Component Value Date/Time   NA 142 01/10/2019 1058   NA 141 02/03/2017 0932   K 4.2 01/10/2019 1058   K 4.8 02/03/2017 0932   CL 109 01/10/2019 1058   CO2 27 01/10/2019 1058   CO2 27 02/03/2017 0932   GLUCOSE 81 01/10/2019 1058   GLUCOSE 89 02/03/2017 0932   BUN 22 01/10/2019 1058   BUN 17.7 02/03/2017 0932   CREATININE 1.19 (H) 01/10/2019 1058   CREATININE 1.3 (H) 02/03/2017 0932   CALCIUM 8.9 01/10/2019 1058   CALCIUM 9.4 02/03/2017 0932   PROT 6.5 01/10/2019 1058   PROT 6.5 02/03/2017 0932   ALBUMIN 3.5 01/10/2019 1058   ALBUMIN 3.4 (L) 02/03/2017 0932   AST 18 01/10/2019 1058   AST 19 02/03/2017 0932   ALT 10 01/10/2019 1058   ALT 12 02/03/2017 0932   ALKPHOS 45 01/10/2019 1058   ALKPHOS 42 02/03/2017 0932   BILITOT 0.3 01/10/2019 1058   BILITOT 0.37 02/03/2017 0932   GFRNONAA 44 (L) 01/10/2019 1058   GFRAA 51 (L) 01/10/2019 1058    INo results found for: SPEP, UPEP  Lab Results  Component Value Date   WBC 6.4 01/10/2019   NEUTROABS 4.3 01/10/2019    HGB 13.5 01/10/2019   HCT 41.3 01/10/2019   MCV 91.6 01/10/2019   PLT 221 01/10/2019      Chemistry  Component Value Date/Time   NA 142 01/10/2019 1058   NA 141 02/03/2017 0932   K 4.2 01/10/2019 1058   K 4.8 02/03/2017 0932   CL 109 01/10/2019 1058   CO2 27 01/10/2019 1058   CO2 27 02/03/2017 0932   BUN 22 01/10/2019 1058   BUN 17.7 02/03/2017 0932   CREATININE 1.19 (H) 01/10/2019 1058   CREATININE 1.3 (H) 02/03/2017 0932      Component Value Date/Time   CALCIUM 8.9 01/10/2019 1058   CALCIUM 9.4 02/03/2017 0932   ALKPHOS 45 01/10/2019 1058   ALKPHOS 42 02/03/2017 0932   AST 18 01/10/2019 1058   AST 19 02/03/2017 0932   ALT 10 01/10/2019 1058   ALT 12 02/03/2017 0932   BILITOT 0.3 01/10/2019 1058   BILITOT 0.37 02/03/2017 0932       No results found for: LABCA2  No components found for: LABCA125  No results for input(s): INR in the last 168 hours.  Urinalysis    Component Value Date/Time   COLORURINE YELLOW 01/04/2017 [place] 01/04/2017 1755   LABSPEC <1.005 (L) 01/04/2017 1755   PHURINE 6.0 01/04/2017 1755   GLUCOSEU NEGATIVE 01/04/2017 1755   HGBUR NEGATIVE 01/04/2017 [place] 01/04/2017 1755   KETONESUR NEGATIVE 01/04/2017 1755   PROTEINUR NEGATIVE 01/04/2017 1755   NITRITE NEGATIVE 01/04/2017 1755   LEUKOCYTESUR TRACE (A) 01/04/2017 1755    STUDIES: No results found.  ASSESSMENT: 78 y.o. BRCA negative [place] woman status post right breast Upper outer quadrant biopsy 05/03/2014 for a clinical T1b N0, stage IA invasive ductal carcinoma, grade 1 or 2, estrogen receptor positive, progesterone receptor negative, with an MIB-1 of 12%, and HER-2/neu equivocal  (1) right lumpectomy and sentinel lymph node sampling 05/30/2014 showed a pT1c pN0, stage IA invasive ductal carcinoma, grade 1, repeat HER-2 again negative.  (2) genetics testing 05/17/2014 showed no deleterious mutations in ATM, BARD1, BRCA1, BRCA2,  BRIP1, CDH1, CHEK2, MRE11A, MUTYH, NBN, NF1, PALB2, PTEN, RAD50, RAD51C, RAD51D, and TP53. Cephus [place]ing genetics, BreastNext panel)  (3) Oncotype score of 25 predicts a risk of distant recurrence of 16% if the patient's only systemic treatment is tamoxifen for 5 years; it also predicts a benefit from chemotherapy in the 5% range. Given this marginal benefit the patient opted against adjuvant chemotherapy  (4) adjuvant radiation completed 08/12/2014: Right breast 4250 cGy in 17 sessions, right breast boost 1200 cGy in 6 sessions  (5) started tamoxifen 10/10/2014  (a) status post hysterectomy and bilateral salpingo-oophorectomy in 1972  (6) significant family breast cancer history, to be treated as if gene positive  (a) intensified screening started October 2020 with breast MRI.  (b) mammogram scheduled for May 2021  PLAN: Joetta is now 4-1/2 years out from definitive surgery for her breast cancer with no evidence of disease recurrence.  This is very favorable.  She is tolerating tamoxifen well.  She will complete 5 years of tamoxifen next July.  Today we discussed the possibility of her continuing tamoxifen for a total of 10 years.  She understands the pros and cons.  We will make that decision when she returns to see me.  The fact that in addition to her brother now her sister also has been diagnosed with breast cancer is very concerning.  I think clearly there is a genetic predisposition in her family even though we have not been able to document it.  I think she should be treated as if she were gene positive.  In that regard I discussed intensified screening with her and I have set her up for breast MRI later this month.  She is already scheduled to see Dr. [name] in November.  She will return to see me in June.  She knows to call for any other issue that may develop before the next visit   , Virgie Dad, MD  01/10/19 11:48 AM Medical Oncology and Hematology [facility] [place], [place] [address] Tel. [phone]    Fax. [phone]  I, Jacqualyn Posey am acting as a Education administrator for Chauncey Cruel, MD.   I, Lurline Del MD, have reviewed the above documentation for accuracy and completeness, and I agree with the above.

## 2019-01-10 ENCOUNTER — Other Ambulatory Visit: Payer: Self-pay

## 2019-01-10 ENCOUNTER — Inpatient Hospital Stay: Payer: Medicare Other | Attending: Oncology

## 2019-01-10 ENCOUNTER — Inpatient Hospital Stay (HOSPITAL_BASED_OUTPATIENT_CLINIC_OR_DEPARTMENT_OTHER): Payer: Medicare Other | Admitting: Oncology

## 2019-01-10 VITALS — BP 143/60 | HR 75 | Temp 98.3°F | Resp 18 | Wt 150.4 lb

## 2019-01-10 DIAGNOSIS — Z90710 Acquired absence of both cervix and uterus: Secondary | ICD-10-CM | POA: Diagnosis not present

## 2019-01-10 DIAGNOSIS — Z90722 Acquired absence of ovaries, bilateral: Secondary | ICD-10-CM | POA: Diagnosis not present

## 2019-01-10 DIAGNOSIS — M949 Disorder of cartilage, unspecified: Secondary | ICD-10-CM

## 2019-01-10 DIAGNOSIS — Z170 Estrogen receptor positive status [ER+]: Secondary | ICD-10-CM | POA: Diagnosis not present

## 2019-01-10 DIAGNOSIS — N289 Disorder of kidney and ureter, unspecified: Secondary | ICD-10-CM | POA: Diagnosis not present

## 2019-01-10 DIAGNOSIS — E559 Vitamin D deficiency, unspecified: Secondary | ICD-10-CM | POA: Diagnosis not present

## 2019-01-10 DIAGNOSIS — M899 Disorder of bone, unspecified: Secondary | ICD-10-CM

## 2019-01-10 DIAGNOSIS — Z1231 Encounter for screening mammogram for malignant neoplasm of breast: Secondary | ICD-10-CM

## 2019-01-10 DIAGNOSIS — Z803 Family history of malignant neoplasm of breast: Secondary | ICD-10-CM | POA: Diagnosis not present

## 2019-01-10 DIAGNOSIS — C50411 Malignant neoplasm of upper-outer quadrant of right female breast: Secondary | ICD-10-CM

## 2019-01-10 LAB — CBC WITH DIFFERENTIAL/PLATELET
Abs Immature Granulocytes: 0 10*3/uL (ref 0.00–0.07)
Basophils Absolute: 0.1 10*3/uL (ref 0.0–0.1)
Basophils Relative: 1 %
Eosinophils Absolute: 0.1 10*3/uL (ref 0.0–0.5)
Eosinophils Relative: 2 %
HCT: 41.3 % (ref 36.0–46.0)
Hemoglobin: 13.5 g/dL (ref 12.0–15.0)
Immature Granulocytes: 0 %
Lymphocytes Relative: 23 %
Lymphs Abs: 1.5 10*3/uL (ref 0.7–4.0)
MCH: 29.9 pg (ref 26.0–34.0)
MCHC: 32.7 g/dL (ref 30.0–36.0)
MCV: 91.6 fL (ref 80.0–100.0)
Monocytes Absolute: 0.5 10*3/uL (ref 0.1–1.0)
Monocytes Relative: 8 %
Neutro Abs: 4.3 10*3/uL (ref 1.7–7.7)
Neutrophils Relative %: 66 %
Platelets: 221 10*3/uL (ref 150–400)
RBC: 4.51 MIL/uL (ref 3.87–5.11)
RDW: 13.9 % (ref 11.5–15.5)
WBC: 6.4 10*3/uL (ref 4.0–10.5)
nRBC: 0 % (ref 0.0–0.2)

## 2019-01-10 LAB — COMPREHENSIVE METABOLIC PANEL
ALT: 10 U/L (ref 0–44)
AST: 18 U/L (ref 15–41)
Albumin: 3.5 g/dL (ref 3.5–5.0)
Alkaline Phosphatase: 45 U/L (ref 38–126)
Anion gap: 6 (ref 5–15)
BUN: 22 mg/dL (ref 8–23)
CO2: 27 mmol/L (ref 22–32)
Calcium: 8.9 mg/dL (ref 8.9–10.3)
Chloride: 109 mmol/L (ref 98–111)
Creatinine, Ser: 1.19 mg/dL — ABNORMAL HIGH (ref 0.44–1.00)
GFR calc Af Amer: 51 mL/min — ABNORMAL LOW (ref >60)
GFR calc non Af Amer: 44 mL/min — ABNORMAL LOW (ref >60)
Glucose, Bld: 81 mg/dL (ref 70–99)
Potassium: 4.2 mmol/L (ref 3.5–5.1)
Sodium: 142 mmol/L (ref 135–145)
Total Bilirubin: 0.3 mg/dL (ref 0.3–1.2)
Total Protein: 6.5 g/dL (ref 6.5–8.1)

## 2019-01-11 ENCOUNTER — Telehealth: Payer: Self-pay | Admitting: Oncology

## 2019-01-11 NOTE — Telephone Encounter (Signed)
I talk with patient regarding schedule  

## 2019-01-29 DIAGNOSIS — M25561 Pain in right knee: Secondary | ICD-10-CM | POA: Diagnosis not present

## 2019-01-29 DIAGNOSIS — M25562 Pain in left knee: Secondary | ICD-10-CM | POA: Diagnosis not present

## 2019-01-29 DIAGNOSIS — M47816 Spondylosis without myelopathy or radiculopathy, lumbar region: Secondary | ICD-10-CM | POA: Diagnosis not present

## 2019-02-01 ENCOUNTER — Ambulatory Visit
Admission: RE | Admit: 2019-02-01 | Discharge: 2019-02-01 | Disposition: A | Discharge status: Home / Self Care | Payer: Medicare Other | Source: Ambulatory Visit | Attending: Oncology | Admitting: Oncology

## 2019-02-01 ENCOUNTER — Other Ambulatory Visit: Payer: Self-pay

## 2019-02-01 DIAGNOSIS — Z853 Personal history of malignant neoplasm of breast: Secondary | ICD-10-CM | POA: Diagnosis not present

## 2019-02-01 DIAGNOSIS — Z1231 Encounter for screening mammogram for malignant neoplasm of breast: Secondary | ICD-10-CM

## 2019-02-01 MED ORDER — GADOBUTROL 1 MMOL/ML IV SOLN
6.0000 mL | Freq: Once | INTRAVENOUS | Status: AC | PRN
  Administered 2019-02-01: 6 mL via INTRAVENOUS

## 2019-02-07 DIAGNOSIS — C50411 Malignant neoplasm of upper-outer quadrant of right female breast: Secondary | ICD-10-CM | POA: Diagnosis not present

## 2019-03-13 ENCOUNTER — Other Ambulatory Visit: Payer: Self-pay | Admitting: Oncology

## 2019-03-26 ENCOUNTER — Ambulatory Visit (INDEPENDENT_AMBULATORY_CARE_PROVIDER_SITE_OTHER): Payer: Medicare Other | Admitting: Family Medicine

## 2019-03-26 ENCOUNTER — Encounter: Payer: Self-pay | Admitting: Family Medicine

## 2019-03-26 ENCOUNTER — Other Ambulatory Visit: Payer: Self-pay

## 2019-03-26 DIAGNOSIS — J069 Acute upper respiratory infection, unspecified: Secondary | ICD-10-CM | POA: Diagnosis not present

## 2019-03-26 MED ORDER — BENZONATATE 200 MG PO CAPS: 200.0000 mg | ORAL_CAPSULE | Freq: Three times a day (TID) | ORAL | 1 refills | Status: DC | PRN

## 2019-03-26 NOTE — Patient Instructions (Addendum)
Drink fluids and rest  Schedule an appointment for covid testing  Until you get a negative test and symptoms improve-continue to isolate yourself  Please let us know when you get a result   Tylenol will help any chilld/pain  Tessalon for cough-I sent it  mucinex is a good expectorant over the counter if needed   If symptoms worsen let us know  If severe, go to the ER  Get a thermometer to monitor temp also   If you develop symptoms of a sinus infection also let us know

## 2019-03-26 NOTE — Progress Notes (Signed)
Virtual Visit via Video Note  I connected with Sarah Young on 03/26/19 at 11:30 AM EST by a video enabled telemedicine application and verified that I am speaking with the correct person using two identifiers.  Location: Patient: home Provider: office    I discussed the limitations of evaluation and management by telemedicine and the availability of in person appointments. The patient expressed understanding and agreed to proceed.  Parties involved in encounter  Patient: Sarah Young   Provider:  Loura Pardon MD    History of Present Illness: Pt presents for cough and nasal congestion  Worse today than last week   Started symptoms 12/23  Has isolated herself at home  Mostly head congestion  Now /today throat and chest congestion   Runny nose -mostly clear mucous Spits up some phlegm from throat that is yellow  Sinuses hurt - below her eyes [place] on the L side   No wheezing   No fever that she knows of  Does not have a thermometer  Feels a little feverish today  Baseline body aches- no change Some chills last night   Throat feels raw  Cough is dry/hacking   No GI symptoms  No loss of taste or smell     She still works  Has had a few cases (she was not exposed)  Does wear masks   OTC:   zicam Vit C and D Had some left over tessalon -they did help   Patient Active Problem List   Diagnosis Date Noted  . Viral URI with cough 03/26/2019  . Osteopenia 05/02/2018  . Routine general medical examination at a health care facility 11/21/2016  . Estrogen deficiency 11/19/2016  . Renal insufficiency 11/19/2016  . Pedal edema 12/15/2015  . Family history of breast cancer in female 05/17/2014  . Malignant neoplasm of upper-outer quadrant of right breast in female, estrogen receptor positive ([place]) 05/08/2014  . GERD (gastroesophageal reflux disease) 03/01/2014  . Lumbar degenerative disc disease 08/15/2013  . Chronic pain of scapula 08/15/2013  . Essential  hypertension 10/02/2008  . Migraine 09/06/2008  . Vitamin D deficiency 06/27/2007  . HYPERLIPIDEMIA 05/11/2007  . Generalized anxiety disorder 05/11/2007  . Disorder of bone and cartilage 05/11/2007  . H/O poliomyelitis 05/08/2007  . OSTEOARTHRITIS, HANDS, BILATERAL 05/08/2007  . Fibromyalgia 05/08/2007  . URINARY INCONTINENCE, STRESS, MILD 05/08/2007   Past Medical History:  Diagnosis Date  . Anxiety   . Arthritis    OA of hands  . Breast cancer ([place])   . Breast cancer of upper-outer quadrant of right female breast ([place]) 05/08/2014  . DVT (deep venous thrombosis) ([place]) 1980s  . Fibromyalgia   . Fibromyalgia   . Hyperlipidemia   . Irritable bowel syndrome   . Migraine   . Osteopenia   . S/P radiation therapy 07/11/2014 through 08/12/2014    Right breast 4250 cGy in 17 sessions, right breast boost 1200 cGy in 6 sessions   . Vitamin D deficiency   . Wears glasses    Past Surgical History:  Procedure Laterality Date  . ABDOMINAL HYSTERECTOMY  1972  . APPENDECTOMY    . BREAST EXCISIONAL BIOPSY Left   . BREAST LUMPECTOMY Right 2016  . BREAST SURGERY  1995   lumpectomy fibrocystic breast-lt  . CATARACT EXTRACTION     both  . CHOLECYSTECTOMY  2005  . COLONOSCOPY    . ovarian cyst removed  1985  . RADIOACTIVE SEED GUIDED PARTIAL MASTECTOMY WITH AXILLARY SENTINEL LYMPH NODE BIOPSY Right  05/30/2014   Procedure: RADIOACTIVE SEED GUIDED PARTIAL MASTECTOMY WITH AXILLARY SENTINEL LYMPH NODE BIOPSY;  Surgeon: Autumn Messing III, MD;  Location: [facility];  Service: General;  Laterality: Right;   Social History   Tobacco Use  . Smoking status: Never Smoker  . Smokeless tobacco: Never Used  . Tobacco comment: non smoker  Substance Use Topics  . Alcohol use: No    Alcohol/week: 0.0 standard drinks  . Drug use: No   Family History  Problem Relation Age of Onset  . Hypertension Father   . Cancer  Father 52       esophageal - smoker  . Leukemia Mother   . Cancer Mother 85       leukemia  . Cancer Brother 76       breast CA  . Breast cancer Brother 46  . Cancer Maternal Aunt 80       leukemia  . Cancer Paternal Aunt 69       breast  . Breast cancer Paternal Aunt 36  . Breast cancer Maternal Grandmother 80   Allergies  Allergen Reactions  . Amoxicillin Itching    REACTION: reacts opposite effect made infection worse.  Marland Kitchen Amoxicillin-Pot Clavulanate     REACTION: Felt like body turned inside out  . Esomeprazole Magnesium     REACTION: Nausea  . Gabapentin   . Omeprazole Nausea And Vomiting    REACTION: not effective  . Ranitidine Hcl     REACTION: not effective   Current Outpatient Medications on File Prior to Visit  Medication Sig Dispense Refill  . amitriptyline (ELAVIL) 10 MG tablet Take 2 tablets (20 mg total) by mouth at bedtime. 60 tablet 11  . amLODipine (NORVASC) 5 MG tablet Take 1 tablet (5 mg total) by mouth daily. Needs an appt before future refills are given 30 tablet 11  . aspirin EC 81 MG tablet Take 81 mg by mouth daily.    . calcium carbonate 200 MG capsule Take 250 mg by mouth 2 (two) times daily.    . Cholecalciferol (VITAMIN D PO) Take 1 tablet by mouth 2 (two) times daily. Pt takes 2000 units daily    . fish oil-omega-3 fatty acids 1000 MG capsule Take 1 g by mouth 2 (two) times daily.    . fluticasone (FLONASE) 50 MCG/ACT nasal spray USE 1 SPRAY IN EACH NOSTRIL DAILY AS DIRECTED 16 g 11  . gabapentin (NEURONTIN) 100 MG capsule TAKE ONE CAPSULE BY MOUTH EVERY NIGHT AT BEDTIME 90 capsule 4  . SUMAtriptan (IMITREX) 100 MG tablet TAKE 1 TABLET BY MOUTH AS NEEDED FOR MIGRAINE, MAY REPEAT IN 2 HOURS IF NEEDED. MAX 2 TABS IN 24 HOURS. 9 tablet 3  . tamoxifen (NOLVADEX) 20 MG tablet TAKE ONE TABLET BY MOUTH ONE TIME DAILY 90 tablet 0   No current facility-administered medications on file prior to visit.   Review of Systems  Constitutional: Positive for  malaise/fatigue. Negative for chills, diaphoresis and fever.  HENT: Positive for congestion, sinus pain and sore throat. Negative for ear pain.   Eyes: Negative for blurred vision, discharge and redness.  Respiratory: Positive for cough and sputum production. Negative for shortness of breath, wheezing and stridor.   Cardiovascular: Negative for chest pain, palpitations and leg swelling.  Gastrointestinal: Negative for abdominal pain, diarrhea, nausea and vomiting.  Musculoskeletal: Negative for myalgias.  Skin: Negative for rash.  Neurological: Positive for headaches. Negative for dizziness.     Observations/Objective: Patient appears well, in  no distress (is fatigued)  Weight is baseline  No facial swelling or asymmetry Normal voice-not hoarse and no slurred speech Sounds congested (nasal) No obvious tremor or mobility impairment Moving neck and UEs normally Able to hear the call well  No shortness of breath during interview (cough is harsh and dry sounding) Talkative and mentally sharp with no cognitive changes No skin changes on face or neck , no rash or pallor Affect is normal    Assessment and Plan: Problem List Items Addressed This Visit      Respiratory   Viral URI with cough    Symptoms for a week, chills last night and now some cough  inst to get tested for covid (info given)  Rest/fluids  Tylenol/ mucinex if needed  Tessalon sent in for cough Disc s/s of sinusitis to watch for  Will call with covid result when it returns and continue to isolate until then  inst to call if worse, go to ER if severe or any sob  Will get a thermometer to watch temp          Follow Up Instructions: Drink fluids and rest  Schedule an appointment for covid testing  Until you get a negative test and symptoms improve-continue to isolate yourself  Please let us know when you get a result   Tylenol will help any chilld/pain  Tessalon for cough-I sent it  mucinex is a good  expectorant over the counter if needed   If symptoms worsen let us know  If severe, go to the ER  Get a thermometer to monitor temp also   If you develop symptoms of a sinus infection also let us know    I discussed the assessment and treatment plan with the patient. The patient was provided an opportunity to ask questions and all were answered. The patient agreed with the plan and demonstrated an understanding of the instructions.   The patient was advised to call back or seek an in-person evaluation if the symptoms worsen or if the condition fails to improve as anticipated.     Sarah Pardon, MD

## 2019-03-26 NOTE — Assessment & Plan Note (Signed)
Symptoms for a week, chills last night and now some cough  inst to get tested for covid (info given)  Rest/fluids  Tylenol/ mucinex if needed  Tessalon sent in for cough Disc s/s of sinusitis to watch for  Will call with covid result when it returns and continue to isolate until then  inst to call if worse, go to ER if severe or any sob  Will get a thermometer to watch temp

## 2019-03-27 ENCOUNTER — Ambulatory Visit: Payer: Medicare Other | Attending: Internal Medicine

## 2019-03-27 DIAGNOSIS — Z20822 Contact with and (suspected) exposure to covid-19: Secondary | ICD-10-CM

## 2019-03-28 ENCOUNTER — Telehealth: Payer: Self-pay | Admitting: Family Medicine

## 2019-03-28 LAB — NOVEL CORONAVIRUS, NAA: SARS-CoV-2, NAA: NOT DETECTED

## 2019-03-28 NOTE — Telephone Encounter (Signed)
I sent tessalon for cough when I saw her-is it not helping?  Is the cough severe? Is she wheezing?  Any facial pain /sinus pain ?    Is mucous clear or colored?    What is she taking over the counter for her symptoms right now?

## 2019-03-28 NOTE — Telephone Encounter (Signed)
Patient called in today  Patient had virtual visit on Monday for cold. Cough and runny nose. She said she is not getting any better. She is still having the cough, runny nose and some tightness in her chest . Patient went yesterday to have a covid test done and results are still pending. She would like to know if there is something that could be called into her pharmacy to help.        [place], [place]

## 2019-03-29 MED ORDER — DOXYCYCLINE HYCLATE 100 MG PO TABS: 100.0000 mg | ORAL_TABLET | Freq: Two times a day (BID) | ORAL | 0 refills | Status: DC

## 2019-03-29 NOTE — Telephone Encounter (Signed)
Pt notified Rx sent and advise of Dr. Tower's comments  

## 2019-03-29 NOTE — Telephone Encounter (Signed)
I sent doxycycline to her pharmacy for sinus infection I hope it helps  Keep [place] posted

## 2019-03-29 NOTE — Telephone Encounter (Signed)
Pt said she has been feeling bad for over a week. She said the Lavella Lemons is only helping a little but not much.    She doesn't cough all the time she just has "coughing spells" but she is having chest tightness with the coughing.   She said she is wheezing some but not much.  She is still having sinus/facial pain, pain is at sinus, across face and nose and behind eye.   She is coughing up and also blowing out mucous that is green and yellow in color  Pt is taking Mucinex DM, the tessalon, and a OTC cough syurp and also vitamins.   Pt said she also has a ST and fatigue and feels weak she has been dealing with this for over a week and is requesting some type of medication/ abx to help her. [place]

## 2019-03-30 DIAGNOSIS — I2699 Other pulmonary embolism without acute cor pulmonale: Secondary | ICD-10-CM

## 2019-03-30 HISTORY — DX: Other pulmonary embolism without acute cor pulmonale: I26.99

## 2019-04-03 NOTE — Telephone Encounter (Signed)
Patient called today She stated that she was advised to call once she has completed her medication. Patient stated she has one day left and it has helped with her sinuses but she feels like there is Tightness in her chest. Or like someone is sitting on her chest.  Patient stated she is not feeling any better and would like to know what she should do next

## 2019-04-03 NOTE — Telephone Encounter (Signed)
She needs to be seen.   If agreeable- [facility]t in [facility] unless she prefers [facility] instead  I will cc to [employer]

## 2019-04-03 NOTE — Telephone Encounter (Signed)
I spoke to patient. Patient scheduled appointment at [facility] on 04/04/19 at 6:30.

## 2019-04-04 ENCOUNTER — Ambulatory Visit (INDEPENDENT_AMBULATORY_CARE_PROVIDER_SITE_OTHER): Payer: Medicare Other | Admitting: Family Medicine

## 2019-04-04 VITALS — BP 180/90 | HR 88 | Temp 97.6°F | Ht 64.5 in | Wt 152.0 lb

## 2019-04-04 DIAGNOSIS — R0789 Other chest pain: Secondary | ICD-10-CM

## 2019-04-04 DIAGNOSIS — I10 Essential (primary) hypertension: Secondary | ICD-10-CM

## 2019-04-04 DIAGNOSIS — R0602 Shortness of breath: Secondary | ICD-10-CM | POA: Diagnosis not present

## 2019-04-04 MED ORDER — ALBUTEROL SULFATE HFA 108 (90 BASE) MCG/ACT IN AERS: 2.0000 | INHALATION_SPRAY | RESPIRATORY_TRACT | 0 refills | Status: DC | PRN

## 2019-04-04 MED ORDER — AEROCHAMBER MINI CHAMBER DEVI: 0 refills | Status: DC

## 2019-04-04 NOTE — Patient Instructions (Signed)
For x-ray imaging: [facility]  [address], [place], [place] [address] Enter through [place] and request x-ray department. You will be contacted either via phone or via [place] with your x-ray result.   I will follow-up with you regarding your results. If chest x-ray is negative, I would recommend follow-up with PCP for additional evaluation.    Nonspecific Chest Pain Chest pain can be caused by many different conditions. Some causes of chest pain can be life-threatening. These will require treatment right away. Serious causes of chest pain include:  Heart attack.  A tear in the body's main blood vessel.  Redness and swelling (inflammation) around your heart.  Blood clot in your lungs. Other causes of chest pain may not be so serious. These include:  Heartburn.  Anxiety or stress.  Damage to bones or muscles in your chest.  Lung infections. Chest pain can feel like:  Pain or discomfort in your chest.  Crushing, pressure, aching, or squeezing pain.  Burning or tingling.  Dull or sharp pain that is worse when you move, cough, or take a deep breath.  Pain or discomfort that is also felt in your back, neck, jaw, shoulder, or arm, or pain that spreads to any of these areas. It is hard to know whether your pain is caused by something that is serious or something that is not so serious. So it is important to see your doctor right away if you have chest pain. Follow these instructions at home: Medicines  Take over-the-counter and prescription medicines only as told by your doctor.  If you were prescribed an antibiotic medicine, take it as told by your doctor. Do not stop taking the antibiotic even if you start to feel better. Lifestyle   Rest as told by your doctor.  Do not use any products that contain nicotine or tobacco, such as cigarettes, e-cigarettes, and chewing tobacco. If you need help quitting, ask your doctor.  Do not drink  alcohol.  Make lifestyle changes as told by your doctor. These may include: ? Getting regular exercise. Ask your doctor what activities are safe for you. ? Eating a heart-healthy diet. A diet and nutrition specialist (dietitian) can help you to learn healthy eating options. ? Staying at a healthy weight. ? Treating diabetes or high blood pressure, if needed. ? Lowering your stress. Activities such as yoga and relaxation techniques can help. General instructions  Pay attention to any changes in your symptoms. Tell your doctor about them or any new symptoms.  Avoid any activities that cause chest pain.  Keep all follow-up visits as told by your doctor. This is important. You may need more testing if your chest pain does not go away. Contact a doctor if:  Your chest pain does not go away.  You feel depressed.  You have a fever. Get help right away if:  Your chest pain is worse.  You have a cough that gets worse, or you cough up blood.  You have very bad (severe) pain in your belly (abdomen).  You pass out (faint).  You have either of these for no clear reason: ? Sudden chest discomfort. ? Sudden discomfort in your arms, back, neck, or jaw.  You have shortness of breath at any time.  You suddenly start to sweat, or your skin gets clammy.  You feel sick to your stomach (nauseous).  You throw up (vomit).  You suddenly feel lightheaded or dizzy.  You feel very weak or tired.  Your heart starts to beat fast, or it feels like it is skipping beats. These symptoms may be an emergency. Do not wait to see if the symptoms will go away. Get medical help right away. Call your local emergency services (911 in the [place] Do not drive yourself to the hospital. Summary  Chest pain can be caused by many different conditions. The cause may be serious and need treatment right away. If you have chest pain, see your doctor right away.  Follow your doctor's instructions for taking  medicines and making lifestyle changes.  Keep all follow-up visits as told by your doctor. This includes visits for any further testing if your chest pain does not go away.  Be sure to know the signs that show that your condition has become worse. Get help right away if you have these symptoms. This information is not intended to replace advice given to you by your health care provider. Make sure you discuss any questions you have with your health care provider. Document Revised: 09/15/2017 Document Reviewed: 09/15/2017 Elsevier Patient Education  2020 [employer].

## 2019-04-04 NOTE — Progress Notes (Signed)
Patient ID: Sarah Young, female    DOB: July 04, 1940, 79 y.o.   MRN: [id number]  PCP: Abner Greenspan, MD  No chief complaint on file.   Subjective:  HPI Sarah Young is a 79 y.o. female presents for evaluation ongoing shortness of breath and chest pain. Patient reports a few days prior to Christmas patient developed congestion and sinus pressure. She Subsequently developed chest pressure and mild shortness of breath which has progressively worsened. Patient tested negative for COVID-19 03/27/19.  Patient has no known exposure to COVID-19 and tested negative for COVID-19 8 days ago prior. Coughing persistently,  non-productive. History of PNA several years ago. She has remained afebrile. Never lost taste sense of smell. Doxycline prescribed by PCP which resolved sinus symptoms. She reports experiencing orthopnea which is new, requiring her to sleep on a least two pillow or sitting as lying down worsens dyspnea. Patient denies unintentional weight gain. No known history of CHF. BP is accelerated at present and patient endorses systolic home readings [id number] to 150's when checked at home. Social History   Socioeconomic History  . Marital status: Married    Spouse name: Not on file  . Number of children: Not on file  . Years of education: Not on file  . Highest education level: Not on file  Occupational History  . Not on file  Tobacco Use  . Smoking status: Never Smoker  . Smokeless tobacco: Never Used  . Tobacco comment: non smoker  Substance and Sexual Activity  . Alcohol use: No    Alcohol/week: 0.0 standard drinks  . Drug use: No  . Sexual activity: Not Currently  Other Topics Concern  . Not on file  Social History Narrative  . Not on file   Social Determinants of Health   Financial Resource Strain:   . Difficulty of Paying Living Expenses: Not on file  Food Insecurity:   . Worried About Charity fundraiser in the Last Year: Not on file  . Ran Out of Food in the Last Year:  Not on file  Transportation Needs:   . Lack of Transportation (Medical): Not on file  . Lack of Transportation (Non-Medical): Not on file  Physical Activity:   . Days of Exercise per Week: Not on file  . Minutes of Exercise per Session: Not on file  Stress:   . Feeling of Stress : Not on file  Social Connections:   . Frequency of Communication with Friends and Family: Not on file  . Frequency of Social Gatherings with Friends and Family: Not on file  . Attends Religious Services: Not on file  . Active Member of Clubs or Organizations: Not on file  . Attends Archivist Meetings: Not on file  . Marital Status: Not on file  Intimate Partner Violence:   . Fear of Current or Ex-Partner: Not on file  . Emotionally Abused: Not on file  . Physically Abused: Not on file  . Sexually Abused: Not on file    Family History  Problem Relation Age of Onset  . Hypertension Father   . Cancer Father 28       esophageal - smoker  . Leukemia Mother   . Cancer Mother 64       leukemia  . Cancer Brother 13       breast CA  . Breast cancer Brother 65  . Cancer Maternal Aunt 80       leukemia  . Cancer Paternal Aunt 18  breast  . Breast cancer Paternal Aunt 51  . Breast cancer Maternal Grandmother 80     Review of Systems  Pertinent negatives listed in HPI  Allergies  Allergen Reactions  . Amoxicillin Itching    REACTION: reacts opposite effect made infection worse.  Marland Kitchen Amoxicillin-Pot Clavulanate     REACTION: Felt like body turned inside out  . Esomeprazole Magnesium     REACTION: Nausea  . Gabapentin   . Omeprazole Nausea And Vomiting    REACTION: not effective  . Ranitidine Hcl     REACTION: not effective    Prior to Admission medications   Medication Sig Start Date End Date Taking? Authorizing Provider  amitriptyline (ELAVIL) 10 MG tablet Take 2 tablets (20 mg total) by mouth at bedtime. 05/02/18  Yes Tower, Wynelle Fanny, MD  amLODipine (NORVASC) 5 MG tablet Take 1  tablet (5 mg total) by mouth daily. Needs an appt before future refills are given 05/02/18  Yes Tower, Wynelle Fanny, MD  Ascorbic Acid (VITAMIN C) 100 MG tablet Take 100 mg by mouth daily.   Yes [provider]  aspirin EC 81 MG tablet Take 81 mg by mouth daily.   Yes [provider]  benzonatate (TESSALON) 200 MG capsule Take 1 capsule (200 mg total) by mouth 3 (three) times daily as needed for cough. Swallow whole, do not bite pill 03/26/19  Yes Tower, Marne A, MD  calcium carbonate 200 MG capsule Take 250 mg by mouth 2 (two) times daily.   Yes [provider]  Cholecalciferol (VITAMIN D PO) Take 1 tablet by mouth 2 (two) times daily. Pt takes 2000 units daily   Yes [provider]  doxycycline (VIBRA-TABS) 100 MG tablet Take 1 tablet (100 mg total) by mouth 2 (two) times daily. 03/29/19  Yes Tower, Wynelle Fanny, MD  fluticasone (FLONASE) 50 MCG/ACT nasal spray USE 1 SPRAY IN EACH NOSTRIL DAILY AS DIRECTED 05/02/18  Yes Tower, Marne A, MD  gabapentin (NEURONTIN) 100 MG capsule TAKE ONE CAPSULE BY MOUTH EVERY NIGHT AT BEDTIME 01/22/16  Yes Magrinat, Virgie Dad, MD  SUMAtriptan (IMITREX) 100 MG tablet TAKE 1 TABLET BY MOUTH AS NEEDED FOR MIGRAINE, MAY REPEAT IN 2 HOURS IF NEEDED. MAX 2 TABS IN 24 HOURS. 12/12/18  Yes Tower, Wynelle Fanny, MD  tamoxifen (NOLVADEX) 20 MG tablet TAKE ONE TABLET BY MOUTH ONE TIME DAILY 03/13/19  Yes Magrinat, Virgie Dad, MD    Past Medical, Surgical Family and Social History reviewed and updated.    Objective:   Today's Vitals   04/04/19 1813  BP: (!) 180/90  Pulse: 88  Temp: 97.6 F (36.4 C)  SpO2: 97%  Weight: 152 lb 0.6 oz (69 kg)  Height: 5' 4.5" (1.638 m)    BP Readings from Last 3 Encounters:  04/04/19 (!) 180/90  03/26/19 135/64  01/10/19 (!) 143/60    Filed Weights   04/04/19 1813  Weight: 152 lb 0.6 oz (69 kg)     Physical Exam Constitutional:      Appearance: She is not ill-appearing.  HENT:     Head: Normocephalic.      Nose: Nose normal. No congestion or rhinorrhea.  Cardiovascular:     Rate and Rhythm: Normal rate.     Pulses: Normal pulses.  Pulmonary:     Breath sounds: No decreased air movement or transmitted upper airway sounds. Decreased breath sounds present. No wheezing, rhonchi or rales.  Musculoskeletal:     Comments: Negative BLE edema  Neurological:     Mental Status: She is alert.  Psychiatric:        Mood and Affect: Mood normal.      Assessment & Plan:  1. Tightness in chest Encouraged cardiology follow-up as symptoms may be related to underlying cardiac condition.. Chest x-ray pending, to rule out pneumonia or bronchial changes as a source symptoms - DG Chest 2 View  2. Accelerated hypertension -Encouraged follow-up with PCP for management of hypertension. -Continue monitoring BP at home.  3. Shortness of breath -Trial Albuterol inhaler 2 puffs every 4-6 hours as needed. -Recommended follow-up PCP for cardiology if imaging is negative. - DG Chest 2 View   DG Chest 2 View  Result Date: 04/05/2019 CLINICAL DATA:  Shortness of breath. EXAM: CHEST - 2 VIEW COMPARISON:  No prior. FINDINGS: Mediastinum hilar structures normal. Lungs are clear. No pleural effusion or pneumothorax. Heart size normal. Surgical clips noted over the right chest. Surgical clips right upper quadrant. IMPRESSION: No acute cardiopulmonary disease. Electronically Signed   By: Marcello Moores  Register   On: 04/05/2019 10:17      Molli Barrows, FNP-C [facility], PRN Provider  [facility]. [place], [facility] Phone: [phone] Clinic Fax: [phone] Clinic Hours: 5:30 pm -7:30 pm (Monday, Wednesday, and Friday)

## 2019-04-05 ENCOUNTER — Ambulatory Visit (HOSPITAL_COMMUNITY)
Admission: RE | Admit: 2019-04-05 | Discharge: 2019-04-05 | Disposition: A | Discharge status: Home / Self Care | Payer: Medicare Other | Source: Ambulatory Visit | Attending: Family Medicine | Admitting: Family Medicine

## 2019-04-05 ENCOUNTER — Other Ambulatory Visit: Payer: Self-pay

## 2019-04-05 DIAGNOSIS — R0789 Other chest pain: Secondary | ICD-10-CM | POA: Insufficient documentation

## 2019-04-05 DIAGNOSIS — R0602 Shortness of breath: Secondary | ICD-10-CM | POA: Diagnosis not present

## 2019-04-06 ENCOUNTER — Encounter: Payer: Self-pay | Admitting: Cardiology

## 2019-04-06 ENCOUNTER — Ambulatory Visit: Payer: Medicare Other | Admitting: Cardiology

## 2019-04-06 ENCOUNTER — Telehealth: Payer: Self-pay

## 2019-04-06 VITALS — BP 130/70 | HR 80 | Temp 98.1°F | Ht 65.0 in | Wt 150.0 lb

## 2019-04-06 DIAGNOSIS — R0602 Shortness of breath: Secondary | ICD-10-CM | POA: Insufficient documentation

## 2019-04-06 DIAGNOSIS — R079 Chest pain, unspecified: Secondary | ICD-10-CM | POA: Diagnosis not present

## 2019-04-06 DIAGNOSIS — E782 Mixed hyperlipidemia: Secondary | ICD-10-CM | POA: Diagnosis not present

## 2019-04-06 DIAGNOSIS — R0609 Other forms of dyspnea: Secondary | ICD-10-CM | POA: Insufficient documentation

## 2019-04-06 DIAGNOSIS — I10 Essential (primary) hypertension: Secondary | ICD-10-CM | POA: Diagnosis not present

## 2019-04-06 DIAGNOSIS — R0789 Other chest pain: Secondary | ICD-10-CM | POA: Insufficient documentation

## 2019-04-06 NOTE — Telephone Encounter (Signed)
Patient advised. Patient would like to see Dr. Sherren Mocha, he sees patient's husband. Thank you

## 2019-04-06 NOTE — Progress Notes (Signed)
Primary Care Provider: Abner Greenspan, MD Cardiologist: No primary care provider on file.  Jovita Kussmaul, MD as Consulting Physician (General Surgery) Magrinat, Virgie Dad, MD as Consulting Physician (Oncology)  Clinic Note: Chief Complaint  Patient presents with  . New Patient (Initial Visit)  . Shortness of Breath  . Chest Pain    HPI:    Sarah Young is a 79 y.o. female who is being seen today for the evaluation of CHEST TIGHTNESS along with ORTHOPNEA at the request of Molli Barrows, FNP/ [place], Wynelle Fanny, MD.  [name] was just seen by Molli Barrows, FNP on January 6 for dyspnea and chest pain that began over the Christmas holidays.  This is gotten progressively worse.  She has been treated for a URI symptoms.  Has ruled out for COVID-19.  Her cough was persistent but nonproductive, and was treated with Tessalon.  She also has completed a course of doxycycline.  Recent Hospitalizations: None  Reviewed  CV studies:    The following studies were reviewed today: (if available, images/films reviewed: From Epic Chart or Care Everywhere) . None:   Interval History:   Sarah Young presents here today stating that while her cough has notably improved as has the congestion, she still has pretty significant shortness of breath with orthopnea but also has tightness in her chest off and on.  The chest tightness is worse with exertion, but also happens with cough.  She notes that she is now having at least 3 pillow orthopnea but has been going on since the onset of symptoms.  She never had any of the symptoms like this before.  She says overall things are improving, but it just still this persistent tightness in her chest that can occur off and on not necessarily associated with any particular activity.   CV Review of Symptoms (Summary) Cardiovascular ROS: positive for - chest pain, dyspnea on exertion, orthopnea and paroxysmal nocturnal dyspnea negative for - edema,  irregular heartbeat, palpitations, rapid heart rate, shortness of breath or Syncope/near syncope, TIA/amaurosis fugax, claudication  The patient does not have symptoms concerning for COVID-19 infection (fever, chills, cough, or new shortness of breath).-But has been evaluated for moved out for COVID-19 The patient is practicing social distancing and masking.  REVIEWED OF SYSTEMS   A comprehensive ROS was performed. Review of Systems  Constitutional: Positive for malaise/fatigue. Negative for weight loss (But also no weight gain).  HENT: Positive for congestion. Negative for nosebleeds.   Respiratory: Positive for cough (Cough has definitely improved, no longer using Tessalon), shortness of breath and wheezing. Negative for sputum production.   Cardiovascular: Negative for leg swelling.  Gastrointestinal: Negative for blood in stool and melena.  Genitourinary: Negative for hematuria.  Musculoskeletal: Negative for joint pain.  Neurological: Positive for dizziness and headaches (No longer present). Negative for focal weakness and weakness.  Endo/Heme/Allergies: Negative for environmental allergies.  Psychiatric/Behavioral: Negative for memory loss. The patient is not nervous/anxious and does not have insomnia.    I have reviewed and (if needed) personally updated the patient's problem list, medications, allergies, past medical and surgical history, social and family history.   PAST MEDICAL HISTORY   Past Medical History:  Diagnosis Date  . Anxiety   . Arthritis    OA of hands  . Breast cancer of upper-outer quadrant of right female breast ([place]) 05/08/2014  . DVT (deep venous thrombosis) ([place]) 1980s  . Fibromyalgia   . Hyperlipidemia   . Irritable  bowel syndrome   . Migraine   . Osteopenia   . S/P radiation therapy 06/2014   Right breast 4250 cGy in 17 sessions, right breast boost 1200 cGy in 6 sessions= 07/11/2014 through 08/12/2014   . Vitamin D deficiency     . Wears glasses      PAST SURGICAL HISTORY   Past Surgical History:  Procedure Laterality Date  . ABDOMINAL HYSTERECTOMY  1972  . APPENDECTOMY    . BREAST EXCISIONAL BIOPSY Left   . BREAST LUMPECTOMY Right 2016  . BREAST SURGERY  1995   lumpectomy fibrocystic breast-lt  . CATARACT EXTRACTION     both  . CHOLECYSTECTOMY  2005  . COLONOSCOPY    . ovarian cyst removed  1985  . RADIOACTIVE SEED GUIDED PARTIAL MASTECTOMY WITH AXILLARY SENTINEL LYMPH NODE BIOPSY Right 05/30/2014   Procedure: RADIOACTIVE SEED GUIDED PARTIAL MASTECTOMY WITH AXILLARY SENTINEL LYMPH NODE BIOPSY;  Surgeon: Autumn Messing III, MD;  Location: [place];  Service: General;  Laterality: Right;     MEDICATIONS/ALLERGIES   Current Meds  Medication Sig  . albuterol (VENTOLIN HFA) 108 (90 Base) MCG/ACT inhaler Inhale 2 puffs into the lungs every 4 (four) hours as needed for wheezing or shortness of breath (cough, shortness of breath or wheezing.).  Marland Kitchen amitriptyline (ELAVIL) 10 MG tablet Take 2 tablets (20 mg total) by mouth at bedtime.  Marland Kitchen amLODipine (NORVASC) 5 MG tablet Take 1 tablet (5 mg total) by mouth daily. Needs an appt before future refills are given  . Ascorbic Acid (VITAMIN C) 100 MG tablet Take 100 mg by mouth daily.  Marland Kitchen aspirin EC 81 MG tablet Take 81 mg by mouth daily.  . benzonatate (TESSALON) 200 MG capsule Take 1 capsule (200 mg total) by mouth 3 (three) times daily as needed for cough. Swallow whole, do not bite pill  . calcium carbonate 200 MG capsule Take 250 mg by mouth 2 (two) times daily.  . Cholecalciferol (VITAMIN D PO) Take 1 tablet by mouth 2 (two) times daily. Pt takes 2000 units daily  . doxycycline (VIBRA-TABS) 100 MG tablet Take 1 tablet (100 mg total) by mouth 2 (two) times daily.  . fluticasone (FLONASE) 50 MCG/ACT nasal spray USE 1 SPRAY IN EACH NOSTRIL DAILY AS DIRECTED  . gabapentin (NEURONTIN) 100 MG capsule TAKE ONE CAPSULE BY MOUTH EVERY NIGHT AT BEDTIME  .  Spacer/Aero-Holding Chambers (AEROCHAMBER MINI CHAMBER) DEVI For use with albuterol inhaler  . SUMAtriptan (IMITREX) 100 MG tablet TAKE 1 TABLET BY MOUTH AS NEEDED FOR MIGRAINE, MAY REPEAT IN 2 HOURS IF NEEDED. MAX 2 TABS IN 24 HOURS.  . tamoxifen (NOLVADEX) 20 MG tablet TAKE ONE TABLET BY MOUTH ONE TIME DAILY    Allergies  Allergen Reactions  . Amoxicillin Itching    REACTION: reacts opposite effect made infection worse.  Marland Kitchen Amoxicillin-Pot Clavulanate     REACTION: Felt like body turned inside out  . Esomeprazole Magnesium     REACTION: Nausea  . Gabapentin   . Omeprazole Nausea And Vomiting    REACTION: not effective  . Ranitidine Hcl     REACTION: not effective     SOCIAL HISTORY/FAMILY HISTORY   Social History   Tobacco Use  . Smoking status: Never Smoker  . Smokeless tobacco: Never Used  . Tobacco comment: non smoker  Substance Use Topics  . Alcohol use: No    Alcohol/week: 0.0 standard drinks  . Drug use: No   Social History   Social History  Narrative   Usually very active.      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    Family History family history includes Breast cancer (age of onset: 80) in her brother; Breast cancer (age of onset: 72) in her paternal aunt; Breast cancer (age of onset: 57) in her maternal grandmother; Cancer (age of onset: 20) in her brother; Cancer (age of onset: 8) in her father; Cancer (age of onset: 29) in her paternal aunt; Cancer (age of onset: 85) in her maternal aunt; Cancer (age of onset: 30) in her mother; Hypertension in her father; Leukemia in her mother.   OBJCTIVE -PE, EKG, labs   Wt Readings from Last 3 Encounters:  04/06/19 150 lb (68 kg)  04/04/19 152 lb 0.6 oz (69 kg)  01/10/19 150 lb 6.4 oz (68.2 kg)    Physical Exam: BP 130/70   Pulse 80   Temp 98.1 F (36.7 C)   Ht 5\' 5"  (1.651 m)   Wt 150 lb (68 kg)   SpO2 99%   BMI 24.96 kg/m   --Blood pressures actually been more low 140s /70s at  home. Physical Exam  Constitutional: She is oriented to person, place, and time. She appears well-developed and well-nourished. No distress.  Not distressed, but seems to be upset  HENT:  Head: Normocephalic and atraumatic.  Eyes: Pupils are equal, round, and reactive to light. Conjunctivae and EOM are normal.  Neck: No hepatojugular reflux and no JVD present. Carotid bruit is not present.  Cardiovascular: Normal rate, regular rhythm, normal heart sounds, intact distal pulses and normal pulses.  No extrasystoles are present. PMI is not displaced. Exam reveals no gallop and no friction rub.  No murmur heard. Pulmonary/Chest: Effort normal and breath sounds normal. She exhibits tenderness.  No obvious wheezes rales or rhonchi.  Abdominal: Soft. Bowel sounds are normal. She exhibits no distension. There is no abdominal tenderness. There is no rebound.  Musculoskeletal:        General: No edema. Normal range of motion.     Cervical back: Normal range of motion and neck supple.  Neurological: She is alert and oriented to person, place, and time. No cranial nerve deficit (Grossly intact).  Skin: Skin is warm and dry. No erythema.  Psychiatric: She has a normal mood and affect. Her behavior is normal. Judgment and thought content normal.  Vitals reviewed.   Adult ECG Report  Rate: 80 ;  Rhythm: normal sinus rhythm and Normal axis, intervals and durations.;   Narrative Interpretation: Normal EKG  Recent Labs:    Lab Results  Component Value Date   CHOL 200 05/02/2018   HDL 63.20 05/02/2018   LDLCALC 114 (H) 05/02/2018   LDLDIRECT 143.9 04/20/2013   TRIG 115.0 05/02/2018   CHOLHDL 3 05/02/2018   Lab Results  Component Value Date   CREATININE 1.19 (H) 01/10/2019   BUN 22 01/10/2019   NA 142 01/10/2019   K 4.2 01/10/2019   CL 109 01/10/2019   CO2 27 01/10/2019    ASSESSMENT/PLAN    Problem List Items Addressed This Visit    HYPERLIPIDEMIA - Primary (Chronic)    Has not had labs  checked since February of last year.  Is due next year.  LDL is 114 as of last year.  Not currently on any treatment.  Depending on results of stress test, may want to consider statin.      Essential hypertension (Chronic)    Blood pressure seem to be pretty well  controlled here in the office, but have been higher at home per her report.  She is on amlodipine.  Not on beta-blocker or ACE inhibitor/ARB.  Pending results of ischemic evaluation and echocardiogram may want to consider beta-blocker or ARB/ACE inhibitor.      Chest pain of unknown etiology - Moderate Risk for Cardiac Etiology    I suspect that her chest pain is probably related to her recent upper respiratory infection symptoms, but cannot exclude ischemic CAD.  Plan: We will check Lexiscan Myoview (would defer coronary CTA, but in the interest of time, will order Lexiscan Myoview next week.      Relevant Orders   EKG 12-Lead   ECHOCARDIOGRAM COMPLETE   LEXISCAN---MYOCARDIAL PERFUSION IMAGING   Shortness of breath on exertion    Exertional dyspnea with some baseline dyspnea along with orthopnea.  Need to exclude heart failure/cardiomyopathy.  Plan: Check 2D echocardiogram and Lexiscan Myoview.Marland Kitchen          COVID-19 Education: The signs and symptoms of COVID-19 were discussed with the patient and how to seek care for testing (follow up with PCP or arrange E-visit).   The importance of social distancing was discussed today.  I spent a total of 59minutes with the patient and chart review. >  50% of the time was spent in direct patient consultation.  Additional time spent with chart review (studies, outside notes, etc): 10 Total Time: 32 min   Current medicines are reviewed at length with the patient today.  (+/- concerns) none   Patient Instructions / Medication Changes & Studies & Tests Ordered   Patient Instructions  Medication Instructions:  No changes *If you need a refill on your cardiac medications before  your next appointment, please call your pharmacy*  Lab Work: Not needed If you have labs (blood work) drawn today and your tests are completely normal, you will receive your results only by: Marland Kitchen MyChart Message (if you have MyChart) OR . A paper copy in the mail If you have any lab test that is abnormal or we need to change your treatment, we will call you to review the results.  Testing/Procedures: Will be schedule at [place] has requested that you have an echocardiogram. Echocardiography is a painless test that uses sound waves to create images of your heart. It provides your doctor with information about the size and shape of your heart and how well your heart's chambers and valves are working. This procedure takes approximately one hour. There are no restrictions for this procedure.   and  will be schedule at [place] has requested that you have a lexiscan myoview. Please follow instruction sheet, as given.    Follow-Up: At [facility], you and your health needs are our priority.  As part of our continuing mission to provide you with exceptional heart care, we have created designated Provider Care Teams.  These Care Teams include your primary Cardiologist (physician) and Advanced Practice Providers (APPs -  Physician Assistants and Nurse Practitioners) who all work together to provide you with the care you need, when you need it.  Your next appointment:   2 week(s)  The format for your next appointment:   In Person  Provider:   Glenetta Hew, MD      Studies Ordered:   Orders Placed This Encounter  Procedures  . LEXISCAN---MYOCARDIAL PERFUSION IMAGING  . EKG 12-Lead  . ECHOCARDIOGRAM COMPLETE  Glenetta Hew, M.D., M.S. Interventional Cardiologist   Pager # [phone] Phone # [phone] [address]. [place], [place] [address]   Thank you for choosing Heartcare at  [facility]!!

## 2019-04-06 NOTE — Telephone Encounter (Signed)
Called [facility], Dr Burt Knack is not accepting New patients at this time. Dr York Cerise RN will call the patient directly to explain and to schedule her the Cardiology appointment

## 2019-04-06 NOTE — Telephone Encounter (Signed)
Patient contacted the office and states that after her appointment with the respiratory clinic, they advised her to follow up with a cardiologist. Patient asks that this be placed. Please advise.

## 2019-04-06 NOTE — Telephone Encounter (Signed)
Referral done-will route to [facility] 

## 2019-04-06 NOTE — Patient Instructions (Signed)
Medication Instructions:  No changes *If you need a refill on your cardiac medications before your next appointment, please call your pharmacy*  Lab Work: Not needed If you have labs (blood work) drawn today and your tests are completely normal, you will receive your results only by: Marland Kitchen MyChart Message (if you have MyChart) OR . A paper copy in the mail If you have any lab test that is abnormal or we need to change your treatment, we will call you to review the results.  Testing/Procedures: Will be schedule at [place] has requested that you have an echocardiogram. Echocardiography is a painless test that uses sound waves to create images of your heart. It provides your doctor with information about the size and shape of your heart and how well your heart's chambers and valves are working. This procedure takes approximately one hour. There are no restrictions for this procedure.   and  will be schedule at [place] has requested that you have a lexiscan myoview. Please follow instruction sheet, as given.    Follow-Up: At [facility], you and your health needs are our priority.  As part of our continuing mission to provide you with exceptional heart care, we have created designated Provider Care Teams.  These Care Teams include your primary Cardiologist (physician) and Advanced Practice Providers (APPs -  Physician Assistants and Nurse Practitioners) who all work together to provide you with the care you need, when you need it.  Your next appointment:   2 week(s)  The format for your next appointment:   In Person  Provider:   Glenetta Hew, MD

## 2019-04-08 ENCOUNTER — Encounter: Payer: Self-pay | Admitting: Cardiology

## 2019-04-08 NOTE — Assessment & Plan Note (Deleted)
Exertional dyspnea with some baseline dyspnea along with orthopnea.  Need to exclude heart failure/cardiomyopathy.  Plan: Check 2D echocardiogram and Lexiscan Myoview.Marland Kitchen

## 2019-04-08 NOTE — Assessment & Plan Note (Signed)
Exertional dyspnea with some baseline dyspnea along with orthopnea.  Need to exclude heart failure/cardiomyopathy.  Plan: Check 2D echocardiogram and Lexiscan Myoview.Marland Kitchen

## 2019-04-08 NOTE — Assessment & Plan Note (Signed)
I suspect that her chest pain is probably related to her recent upper respiratory infection symptoms, but cannot exclude ischemic CAD.  Plan: We will check Lexiscan Myoview (would defer coronary CTA, but in the interest of time, will order Lexiscan Myoview next week.

## 2019-04-08 NOTE — Assessment & Plan Note (Signed)
Blood pressure seem to be pretty well controlled here in the office, but have been higher at home per her report.  She is on amlodipine.  Not on beta-blocker or ACE inhibitor/ARB.  Pending results of ischemic evaluation and echocardiogram may want to consider beta-blocker or ARB/ACE inhibitor.

## 2019-04-08 NOTE — Assessment & Plan Note (Signed)
Has not had labs checked since February of last year.  Is due next year.  LDL is 114 as of last year.  Not currently on any treatment.  Depending on results of stress test, may want to consider statin.

## 2019-04-10 ENCOUNTER — Telehealth (HOSPITAL_COMMUNITY): Payer: Self-pay

## 2019-04-10 NOTE — Telephone Encounter (Signed)
Encounter complete. 

## 2019-04-11 ENCOUNTER — Other Ambulatory Visit: Payer: Self-pay

## 2019-04-11 ENCOUNTER — Ambulatory Visit (HOSPITAL_COMMUNITY)
Admission: RE | Admit: 2019-04-11 | Discharge: 2019-04-11 | Disposition: A | Discharge status: Home / Self Care | Payer: Medicare Other | Source: Ambulatory Visit | Attending: Cardiology | Admitting: Cardiology

## 2019-04-11 DIAGNOSIS — R0602 Shortness of breath: Secondary | ICD-10-CM | POA: Diagnosis not present

## 2019-04-11 DIAGNOSIS — R079 Chest pain, unspecified: Secondary | ICD-10-CM | POA: Insufficient documentation

## 2019-04-11 LAB — MYOCARDIAL PERFUSION IMAGING
LV dias vol: 74 mL (ref 46–106)
LV sys vol: 25 mL
Peak HR: 109 {beats}/min
Rest HR: 66 {beats}/min
SDS: 0
SRS: 0
SSS: 0
TID: 1.4

## 2019-04-11 MED ORDER — AMINOPHYLLINE 25 MG/ML IV SOLN
75.0000 mg | Freq: Once | INTRAVENOUS | Status: AC
  Administered 2019-04-11: 75 mg via INTRAVENOUS

## 2019-04-11 MED ORDER — TECHNETIUM TC 99M TETROFOSMIN IV KIT
10.8000 | PACK | Freq: Once | INTRAVENOUS | Status: AC | PRN
  Administered 2019-04-11: 10.8 via INTRAVENOUS
  Filled 2019-04-11: qty 11

## 2019-04-11 MED ORDER — TECHNETIUM TC 99M TETROFOSMIN IV KIT
29.4000 | PACK | Freq: Once | INTRAVENOUS | Status: AC | PRN
  Administered 2019-04-11: 29.4 via INTRAVENOUS
  Filled 2019-04-11: qty 30

## 2019-04-11 MED ORDER — REGADENOSON 0.4 MG/5ML IV SOLN
0.4000 mg | Freq: Once | INTRAVENOUS | Status: AC
  Administered 2019-04-11: 0.4 mg via INTRAVENOUS

## 2019-04-13 ENCOUNTER — Ambulatory Visit (HOSPITAL_COMMUNITY): Payer: Medicare Other | Attending: Cardiovascular Disease

## 2019-04-13 ENCOUNTER — Other Ambulatory Visit: Payer: Self-pay

## 2019-04-13 DIAGNOSIS — R079 Chest pain, unspecified: Secondary | ICD-10-CM | POA: Diagnosis not present

## 2019-04-13 DIAGNOSIS — R0602 Shortness of breath: Secondary | ICD-10-CM | POA: Insufficient documentation

## 2019-04-20 ENCOUNTER — Ambulatory Visit: Payer: Medicare Other | Admitting: Cardiology

## 2019-04-20 ENCOUNTER — Encounter: Payer: Self-pay | Admitting: Cardiology

## 2019-04-20 ENCOUNTER — Other Ambulatory Visit: Payer: Self-pay

## 2019-04-20 VITALS — BP 149/72 | HR 77 | Ht 65.0 in | Wt 150.2 lb

## 2019-04-20 DIAGNOSIS — R079 Chest pain, unspecified: Secondary | ICD-10-CM | POA: Diagnosis not present

## 2019-04-20 DIAGNOSIS — R0789 Other chest pain: Secondary | ICD-10-CM

## 2019-04-20 DIAGNOSIS — I10 Essential (primary) hypertension: Secondary | ICD-10-CM | POA: Diagnosis not present

## 2019-04-20 DIAGNOSIS — R0602 Shortness of breath: Secondary | ICD-10-CM | POA: Diagnosis not present

## 2019-04-20 MED ORDER — PREDNISONE 10 MG PO TABS: ORAL_TABLET | ORAL | 0 refills | Status: DC

## 2019-04-20 NOTE — Patient Instructions (Signed)
Medication Instructions:  Dr. Ellyn Hack has prescribed a steroid taper - please take as directed -- then follow up with PCP  *If you need a refill on your cardiac medications before your next appointment, please call your pharmacy*  Follow-Up: At [facility], you and your health needs are our priority.  As part of our continuing mission to provide you with exceptional heart care, we have created designated Provider Care Teams.  These Care Teams include your primary Cardiologist (physician) and Advanced Practice Providers (APPs -  Physician Assistants and Nurse Practitioners) who all work together to provide you with the care you need, when you need it.  Your next appointment:   AS NEEDED with Dr. Ellyn Hack

## 2019-04-20 NOTE — Progress Notes (Signed)
Primary Care Provider: Abner Greenspan, MD Cardiologist: No primary care provider on file.  Jovita Kussmaul, MD as Consulting Physician (General Surgery) Magrinat, Virgie Dad, MD as Consulting Physician (Oncology)  Clinic Note: Chief Complaint  Patient presents with  . Follow-up    Test results-echocardiogram and Myoview  . Chest Pain  . Shortness of Breath    HPI:    Sarah Young is a 79 y.o. female who is being seen today for follow-up evaluation of CHEST TIGHTNESS along with ORTHOPNEA.  [name] was referred by Molli Barrows, FNP on January 6 for dyspnea and chest pain that began over the Christmas holidays.  This is gotten progressively worse.  She has been treated for a URI symptoms.  Has ruled out for COVID-19.  Her cough was persistent but nonproductive, and was treated with Tessalon.  She also has completed a course of doxycycline.  I saw her initially for consultation on January 8.  She noted that the cough had improved and congestion but she still has significant dyspnea, orthopnea and chest tightness.  She was sleeping with 3 pillows and has been going on since the onset of her overall symptoms. While I would like to coronary CT angiogram, I wanted to get a quicker answer based on her symptoms so we ordered a 2D echo and Myoview-reviewed below.  She now presents for follow-up to discuss studies.  Recent Hospitalizations: None  Reviewed  CV studies:    The following studies were reviewed today: (if available, images/films reviewed: From Epic Chart or Care Everywhere) . (04/13/2019) TTE: Normal LV size and function.  EF 60 to 65%.  Right and left atrial size with no evidence of significant diastolic function.  Mild mitral prolapse with trivial mitral vegetation.  Mildly elevated pulmonary pressures  (04/11/2019) Lexiscan Myoview: LOW RISK / NORMAL. EF ~65%.  No ischemia or infarction.  (Personally reviewed images.)  Interval History:   Sarah Young presents here  still with the same complaints of this heavy tightness in the front of her chest that is worse with lying down and deep breathing.  She really is not doing much in the way of any activities and events worse with exertion, assist because of the prior pain.  She is not coughing that much anymore but when she does cough it definitely worsens.  She still sleeping on 2-3 pillows because of shortness of breath and discomfort when lying down.  She started to get really tired and worn out just still having a very difficult time catching her breath even at rest.  She says that the breathing issue is twofold-1 because it hard to take a deep breath that she feels like is difficult to expand her chest but the other is that she feels like she is not getting any enough air when she does take a deep breath.   CV Review of Symptoms (Summary) Cardiovascular ROS: positive for - chest pain, dyspnea on exertion, orthopnea, paroxysmal nocturnal dyspnea, shortness of breath and With his shortness of breath she feels somewhat dizzy and just tired and worn out. negative for - edema, irregular heartbeat, palpitations, rapid heart rate or Syncope/near syncope, TIA/amaurosis fugax, claudication  The patient DOES NOT have symptoms concerning for COVID-19 infection (fever, chills, cough, or new shortness of breath).-She was ruled out for COVID-19 The patient is practicing appropriate social distancing and masking  REVIEWED OF SYSTEMS   A comprehensive ROS was performed. Review of Systems  Constitutional: Positive for malaise/fatigue (  Starting to get worn out). Negative for chills, fever and weight loss (But also no weight gain).  HENT: Positive for congestion (Clearing up). Negative for nosebleeds.   Respiratory: Positive for cough (Rare coughing now.), shortness of breath and wheezing. Negative for sputum production.   Cardiovascular: Positive for chest pain (Per HPI). Negative for leg swelling.  Gastrointestinal: Negative  for blood in stool and melena.  Genitourinary: Negative for hematuria.  Musculoskeletal: Negative for joint pain.  Neurological: Positive for dizziness and headaches (No longer present). Negative for focal weakness and weakness.  Endo/Heme/Allergies: Negative for environmental allergies.  Psychiatric/Behavioral: Negative for memory loss. The patient is nervous/anxious (Very anxious about her symptoms.. The patient does not have insomnia.    I have reviewed and (if needed) personally updated the patient's problem list, medications, allergies, past medical and surgical history, social and family history.   PAST MEDICAL HISTORY   Past Medical History:  Diagnosis Date  . Anxiety   . Arthritis    OA of hands  . Breast cancer of upper-outer quadrant of right female breast ([facility]) 05/08/2014  . DVT (deep venous thrombosis) ([facility]) 1980s  . Fibromyalgia   . Hyperlipidemia   . Irritable bowel syndrome   . Migraine   . Osteopenia   . S/P radiation therapy 06/2014   Right breast 4250 cGy in 17 sessions, right breast boost 1200 cGy in 6 sessions= 07/11/2014 through 08/12/2014   . Vitamin D deficiency   . Wears glasses      PAST SURGICAL HISTORY   Past Surgical History:  Procedure Laterality Date  . ABDOMINAL HYSTERECTOMY  1972  . APPENDECTOMY    . BREAST EXCISIONAL BIOPSY Left   . BREAST LUMPECTOMY Right 2016  . BREAST SURGERY  1995   lumpectomy fibrocystic breast-lt  . CATARACT EXTRACTION     both  . CHOLECYSTECTOMY  2005  . COLONOSCOPY    . ovarian cyst removed  1985  . RADIOACTIVE SEED GUIDED PARTIAL MASTECTOMY WITH AXILLARY SENTINEL LYMPH NODE BIOPSY Right 05/30/2014   Procedure: RADIOACTIVE SEED GUIDED PARTIAL MASTECTOMY WITH AXILLARY SENTINEL LYMPH NODE BIOPSY;  Surgeon: Autumn Messing III, MD;  Location: [facility];  Service: General;  Laterality: Right;     MEDICATIONS/ALLERGIES   Current Meds  Medication Sig  . albuterol (VENTOLIN HFA) 108  (90 Base) MCG/ACT inhaler Inhale 2 puffs into the lungs every 4 (four) hours as needed for wheezing or shortness of breath (cough, shortness of breath or wheezing.).  Marland Kitchen amitriptyline (ELAVIL) 10 MG tablet Take 2 tablets (20 mg total) by mouth at bedtime.  Marland Kitchen amLODipine (NORVASC) 5 MG tablet Take 1 tablet (5 mg total) by mouth daily. Needs an appt before future refills are given  . Ascorbic Acid (VITAMIN C) 100 MG tablet Take 100 mg by mouth daily.  Marland Kitchen aspirin EC 81 MG tablet Take 81 mg by mouth daily.  . benzonatate (TESSALON) 200 MG capsule Take 1 capsule (200 mg total) by mouth 3 (three) times daily as needed for cough. Swallow whole, do not bite pill  . calcium carbonate 200 MG capsule Take 250 mg by mouth 2 (two) times daily.  . Cholecalciferol (VITAMIN D PO) Take 1 tablet by mouth 2 (two) times daily. Pt takes 2000 units daily  . doxycycline (VIBRA-TABS) 100 MG tablet Take 1 tablet (100 mg total) by mouth 2 (two) times daily.  . fluticasone (FLONASE) 50 MCG/ACT nasal spray USE 1 SPRAY IN EACH NOSTRIL DAILY AS DIRECTED  . gabapentin (NEURONTIN)  100 MG capsule TAKE ONE CAPSULE BY MOUTH EVERY NIGHT AT BEDTIME  . Spacer/Aero-Holding Chambers (AEROCHAMBER MINI CHAMBER) DEVI For use with albuterol inhaler  . SUMAtriptan (IMITREX) 100 MG tablet TAKE 1 TABLET BY MOUTH AS NEEDED FOR MIGRAINE, MAY REPEAT IN 2 HOURS IF NEEDED. MAX 2 TABS IN 24 HOURS.  . tamoxifen (NOLVADEX) 20 MG tablet TAKE ONE TABLET BY MOUTH ONE TIME DAILY    Allergies  Allergen Reactions  . Amoxicillin Itching    REACTION: reacts opposite effect made infection worse.  Marland Kitchen Amoxicillin-Pot Clavulanate     REACTION: Felt like body turned inside out  . Esomeprazole Magnesium     REACTION: Nausea  . Gabapentin   . Omeprazole Nausea And Vomiting    REACTION: not effective  . Ranitidine Hcl     REACTION: not effective     SOCIAL HISTORY/FAMILY HISTORY   Social History   Tobacco Use  . Smoking status: Never Smoker  .  Smokeless tobacco: Never Used  . Tobacco comment: non smoker  Substance Use Topics  . Alcohol use: No    Alcohol/week: 0.0 standard drinks  . Drug use: No   Social History   Social History Narrative   Usually very active.      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    Family History family history includes Breast cancer (age of onset: 28) in her brother; Breast cancer (age of onset: 74) in her paternal aunt; Breast cancer (age of onset: 85) in her maternal grandmother; Cancer (age of onset: 33) in her brother; Cancer (age of onset: 52) in her father; Cancer (age of onset: 23) in her paternal aunt; Cancer (age of onset: 23) in her maternal aunt; Cancer (age of onset: 57) in her mother; Hypertension in her father; Leukemia in her mother.   OBJCTIVE -PE, EKG, labs   Wt Readings from Last 3 Encounters:  04/20/19 150 lb 3.2 oz (68.1 kg)  04/11/19 150 lb (68 kg)  04/06/19 150 lb (68 kg)    Physical Exam: BP (!) 149/72   Pulse 77   Ht 5\' 5"  (1.651 m)   Wt 150 lb 3.2 oz (68.1 kg)   SpO2 98%   BMI 24.99 kg/m   --Blood pressures actually been more low 140s /70s at home. Physical Exam  Constitutional: She is oriented to person, place, and time. She appears well-developed and well-nourished. No distress.  Well-groomed.  Healthy-appearing woman but definitely seems to be somewhat stressed and uncomfortable.  Seems fatigued  HENT:  Head: Normocephalic and atraumatic.  Eyes: Conjunctivae are normal.  Neck: No hepatojugular reflux and no JVD present. Carotid bruit is not present.  Cardiovascular: Normal rate, regular rhythm, normal heart sounds, intact distal pulses and normal pulses.  No extrasystoles are present. PMI is not displaced. Exam reveals no gallop and no friction rub.  No murmur heard. Pulmonary/Chest: Effort normal and breath sounds normal. She exhibits tenderness (Tenderness along both sternal borders right and left.  Worse with inspiration reproducible on  palpation.).  No obvious wheezes rales or rhonchi.  Musculoskeletal:        General: No edema. Normal range of motion.     Cervical back: Normal range of motion and neck supple.  Neurological: She is alert and oriented to person, place, and time. Cranial nerve deficit: Grossly intact.  Psychiatric: She has a normal mood and affect. Her behavior is normal. Judgment and thought content normal.  Vitals reviewed.   Adult ECG Report  No EKG  Recent Labs:    Lab Results  Component Value Date   CHOL 200 05/02/2018   HDL 63.20 05/02/2018   LDLCALC 114 (H) 05/02/2018   LDLDIRECT 143.9 04/20/2013   TRIG 115.0 05/02/2018   CHOLHDL 3 05/02/2018   Lab Results  Component Value Date   CREATININE 1.19 (H) 01/10/2019   BUN 22 01/10/2019   NA 142 01/10/2019   K 4.2 01/10/2019   CL 109 01/10/2019   CO2 27 01/10/2019    ASSESSMENT/PLAN    Problem List Items Addressed This Visit    Essential hypertension (Chronic)    Blood pressure has been controlled is a little bit high today because she is somewhat anxious.  I think she is on a good regimen.  Would not make any changes at this point.  Although I do not think her chest discomfort is related to coronary spasm, amlodipine will be the appropriate treatment for that as well.      Chest pain of unknown etiology -low risk for Cardiac Etiology    Nonischemic Myoview with relatively normal echocardiogram.  Unlikely that her chest pain is cardiac in nature.  Suspect it is more musculoskeletal related to her recent respiratory tract illness.  We will treat with prednisone taper.      Anterior chest wall pain - Primary    Anterior chest wall pain that to me seems probably consistent with costochondritis.  She had a pretty significant URI/bronchitis episode and is still feeling the effects.  Interesting that she is not sick and it was not Covid.  The respiratory symptoms seem to be improving but she still has a significant dyspnea and exacerbation  of her chest pain with deep inspiration.  Plan: For the costochondritis I think we will try a prednisone taper which would also help some of the respiratory issues.  We have evaluated her with a Myoview that was nonischemic and a relatively normal echocardiogram that would argue against a cardiac etiology.   At this point I would defer back to her PCP.      Shortness of breath on exertion    Nonischemic Myoview.  Echo had normal EF with normal diastolic function.  There is suggestion of mildly increased pulmonary pressures with which would go along with recent pulmonary disease.  Certainly not pulmonary hypertension which would cause dyspnea.  I think this is still all recovery and residual from her respiratory tract infection.          COVID-19 Education: The signs and symptoms of COVID-19 were discussed with the patient and how to seek care for testing (follow up with PCP or arrange E-visit).   The importance of social distancing was discussed today.  I spent a total of 24 minutes with the patient. >  50% of the time was spent in direct patient consultation.  Additional time spent with chart review (studies, outside notes, etc): 10 Total Time: 34 min   Current medicines are reviewed at length with the patient today.  (+/- concerns) none   Patient Instructions / Medication Changes & Studies & Tests Ordered   Patient Instructions  Medication Instructions:  Dr. Ellyn Hack has prescribed a steroid taper - please take as directed -- then follow up with PCP  *If you need a refill on your cardiac medications before your next appointment, please call your pharmacy*  Follow-Up: At [facility], you and your health needs are our priority.  As part of our continuing mission to provide you with exceptional heart care, we  have created designated Provider Care Teams.  These Care Teams include your primary Cardiologist (physician) and Advanced Practice Providers (APPs -  Physician  Assistants and Nurse Practitioners) who all work together to provide you with the care you need, when you need it.  Your next appointment:   AS NEEDED with Dr. Ellyn Hack     Studies Ordered:   No orders of the defined types were placed in this encounter.    Glenetta Hew, M.D., M.S. Interventional Cardiologist   Pager # [phone] Phone # [phone] [address] [place], [place] [address]   Thank you for choosing [facility] at [facility]!!

## 2019-04-22 ENCOUNTER — Encounter: Payer: Self-pay | Admitting: Cardiology

## 2019-04-22 NOTE — Assessment & Plan Note (Signed)
Blood pressure has been controlled is a little bit high today because she is somewhat anxious.  I think she is on a good regimen.  Would not make any changes at this point.  Although I do not think her chest discomfort is related to coronary spasm, amlodipine will be the appropriate treatment for that as well.

## 2019-04-22 NOTE — Assessment & Plan Note (Signed)
Anterior chest wall pain that to me seems probably consistent with costochondritis.  She had a pretty significant URI/bronchitis episode and is still feeling the effects.  Interesting that she is not sick and it was not Covid.  The respiratory symptoms seem to be improving but she still has a significant dyspnea and exacerbation of her chest pain with deep inspiration.  Plan: For the costochondritis I think we will try a prednisone taper which would also help some of the respiratory issues.  We have evaluated her with a Myoview that was nonischemic and a relatively normal echocardiogram that would argue against a cardiac etiology.   At this point I would defer back to her PCP.

## 2019-04-22 NOTE — Assessment & Plan Note (Signed)
Nonischemic Myoview.  Echo had normal EF with normal diastolic function.  There is suggestion of mildly increased pulmonary pressures with which would go along with recent pulmonary disease.  Certainly not pulmonary hypertension which would cause dyspnea.  I think this is still all recovery and residual from her respiratory tract infection.

## 2019-04-22 NOTE — Assessment & Plan Note (Signed)
Nonischemic Myoview with relatively normal echocardiogram.  Unlikely that her chest pain is cardiac in nature.  Suspect it is more musculoskeletal related to her recent respiratory tract illness.  We will treat with prednisone taper.

## 2019-04-24 ENCOUNTER — Other Ambulatory Visit: Payer: Self-pay | Admitting: Family Medicine

## 2019-04-24 ENCOUNTER — Telehealth: Payer: Self-pay | Admitting: Family Medicine

## 2019-04-24 NOTE — Telephone Encounter (Signed)
Pt called to schedule a f/u to discuss other issues on 05/03/19, med refilled once

## 2019-04-24 NOTE — Telephone Encounter (Signed)
Pt had a recent acute appt but no recent f/u or CPE and no future appts., please advise

## 2019-04-24 NOTE — Telephone Encounter (Signed)
Patient called to schedule appointment with Dr.Tower for a follow up from [place], Cardiologist.  Patient saw Dr.Tower before Christmas and Dr.Tower referred patient to pulmonary.  Pulmonary suggested patient see Cardiology.  Patient's weak,trouble breathing, no fever. Patient tested negative for covid. Patient's taking steroids and it's helping.  She stopped coughing, chest tightness has improved.  Patient finishes steroids on 05/02/19 and Dr. Ellyn Hack wanted patient to follow up with Dr.Tower after patient finishes the steroids. Patient scheduled office visit with Dr.Tower on 05/03/19.  Can patient come in to the office or does she need to do a virtual?  Patient prefers to come in to the office. I let patient know I would call her back, if we need to change appointment to a virtual visit.

## 2019-04-24 NOTE — Telephone Encounter (Signed)
Please schedule a late spring PE and refill until then

## 2019-04-24 NOTE — Telephone Encounter (Signed)
As long as she is not coughing/congested (sounds like this is better) we can see her here , thanks

## 2019-04-27 ENCOUNTER — Ambulatory Visit: Payer: Medicare Other

## 2019-05-03 ENCOUNTER — Other Ambulatory Visit: Payer: Self-pay

## 2019-05-03 ENCOUNTER — Encounter: Payer: Self-pay | Admitting: Family Medicine

## 2019-05-03 ENCOUNTER — Ambulatory Visit (INDEPENDENT_AMBULATORY_CARE_PROVIDER_SITE_OTHER): Payer: Medicare Other | Admitting: Family Medicine

## 2019-05-03 VITALS — BP 104/58 | HR 72 | Temp 96.9°F | Ht 65.0 in | Wt 149.2 lb

## 2019-05-03 DIAGNOSIS — I10 Essential (primary) hypertension: Secondary | ICD-10-CM | POA: Diagnosis not present

## 2019-05-03 DIAGNOSIS — R0789 Other chest pain: Secondary | ICD-10-CM | POA: Diagnosis not present

## 2019-05-03 DIAGNOSIS — R0602 Shortness of breath: Secondary | ICD-10-CM

## 2019-05-03 DIAGNOSIS — R079 Chest pain, unspecified: Secondary | ICD-10-CM | POA: Diagnosis not present

## 2019-05-03 LAB — CBC WITH DIFFERENTIAL/PLATELET
Basophils Absolute: 0 10*3/uL (ref 0.0–0.1)
Basophils Relative: 0.4 % (ref 0.0–3.0)
Eosinophils Absolute: 0.1 10*3/uL (ref 0.0–0.7)
Eosinophils Relative: 0.8 % (ref 0.0–5.0)
HCT: 42.7 % (ref 36.0–46.0)
Hemoglobin: 14 g/dL (ref 12.0–15.0)
Lymphocytes Relative: 23.8 % (ref 12.0–46.0)
Lymphs Abs: 2.6 10*3/uL (ref 0.7–4.0)
MCHC: 32.9 g/dL (ref 30.0–36.0)
MCV: 90.3 fl (ref 78.0–100.0)
Monocytes Absolute: 0.9 10*3/uL (ref 0.1–1.0)
Monocytes Relative: 7.9 % (ref 3.0–12.0)
Neutro Abs: 7.4 10*3/uL (ref 1.4–7.7)
Neutrophils Relative %: 67.1 % (ref 43.0–77.0)
Platelets: 165 10*3/uL (ref 150.0–400.0)
RBC: 4.72 Mil/uL (ref 3.87–5.11)
RDW: 14.3 % (ref 11.5–15.5)
WBC: 11.1 10*3/uL — ABNORMAL HIGH (ref 4.0–10.5)

## 2019-05-03 LAB — BASIC METABOLIC PANEL
BUN: 21 mg/dL (ref 6–23)
CO2: 30 mEq/L (ref 19–32)
Calcium: 9.4 mg/dL (ref 8.4–10.5)
Chloride: 101 mEq/L (ref 96–112)
Creatinine, Ser: 1.3 mg/dL — ABNORMAL HIGH (ref 0.40–1.20)
GFR: 39.51 mL/min — ABNORMAL LOW (ref >60.00)
Glucose, Bld: 88 mg/dL (ref 70–99)
Potassium: 3.8 mEq/L (ref 3.5–5.1)
Sodium: 138 mEq/L (ref 135–145)

## 2019-05-03 NOTE — Assessment & Plan Note (Addendum)
Ongoing since uri (neg covid test) - and with continued fatigue Very reassuring exam , vitals (pulse ox ), cxr and cardiac w/u  CT angio ordered  Expect further gradual improvement  Some features of costochondritis ( may need additional prednisone)  Labs today (cbc and chem)

## 2019-05-03 NOTE — Assessment & Plan Note (Signed)
Pt continues to have chest wall tenderness today- per pt improved after prednisone but still bothersome  Would consider longer prednisone course if no further improvement

## 2019-05-03 NOTE — Patient Instructions (Addendum)
Labs today  I want to try and get a CT angiogram of the chest to look for a clot or other finding   We may re start prednisone based on results and how you feel   Please alert [place] if you feel worse (if sudden go to the ER)

## 2019-05-03 NOTE — Progress Notes (Signed)
Subjective:    Patient ID: Sarah Young, female    DOB: December 18, 1940, 79 y.o.   MRN: [id number]  This visit occurred during the SARS-CoV-2 public health emergency.  Safety protocols were in place, including screening questions prior to the visit, additional usage of staff PPE, and extensive cleaning of exam room while observing appropriate contact time as indicated for disinfecting solutions.    HPI  Pt f/u for symptoms since uri in late December  (not covid)  She f/u with the resp clinic on 04/04/19 Had finished course of doxycycline  At that visit c/o chest presure and ongoing sob and orthopnea  bp was elevated then 180/90  CXR was normal  She was referred to cardiology for her symptoms  Also px albuterol   She then saw cardiology : Dr Ellyn Hack He ordered lexiscan myoview and also 2D echo   DG Chest 2 View  Result Date: 04/05/2019 CLINICAL DATA:  Shortness of breath. EXAM: CHEST - 2 VIEW COMPARISON:  No prior. FINDINGS: Mediastinum hilar structures normal. Lungs are clear. No pleural effusion or pneumothorax. Heart size normal. Surgical clips noted over the right chest. Surgical clips right upper quadrant. IMPRESSION: No acute cardiopulmonary disease. Electronically Signed   By: Marcello Moores  Register   On: 04/05/2019 10:17   LEXISCAN---MYOCARDIAL PERFUSION IMAGING  Result Date: 04/11/2019  Nuclear stress EF: 66%. The left ventricular ejection fraction is hyperdynamic (>65%).  This is a low risk study. There is no evidence of ischemia or previous infarction  The study is normal.    ECHOCARDIOGRAM COMPLETE  Result Date: 04/13/2019   ECHOCARDIOGRAM REPORT   Patient Name:   Sarah Young Date of Exam: 04/13/2019 Medical Rec #:  [id number]      Height:       65.0 in Accession #:    [id number]     Weight:       150.0 lb Date of Birth:  [birth year]       BSA:          1.75 m Patient Age:    83 years       BP:           130/70 mmHg Patient Gender: F              HR:           73 bpm. Exam  Location:  [facility] Procedure: 2D Echo, 3D Echo, Cardiac Doppler, Color Doppler and Strain Analysis Indications:    R07.9 Chest pain  History:        Patient has no prior history of Echocardiogram examinations.                 Signs/Symptoms:Dyspnea; Risk Factors:Hypertension and                 Dyslipidemia. H/o breast cancer.  Sonographer:    Jessee Avers, RDCS Referring Phys: [place]  1. Left ventricular ejection fraction, by visual estimation, is 60 to 65%. The left ventricle has normal function. There is no left ventricular hypertrophy.  2. The left ventricle has no regional wall motion abnormalities.  3. Global right ventricle has normal systolic function.The right ventricular size is normal. No increase in right ventricular wall thickness.  4. Left atrial size was normal.  5. Right atrial size was normal.  6. Mild mitral valve prolapse.  7. The mitral valve is normal in structure. Trivial mitral valve regurgitation. No evidence of mitral stenosis.  8.  The tricuspid valve is normal in structure.  9. The aortic valve is normal in structure. Aortic valve regurgitation is not visualized. No evidence of aortic valve sclerosis or stenosis. 10. The pulmonic valve was normal in structure. Pulmonic valve regurgitation is not visualized. 11. Mildly elevated pulmonary artery systolic pressure. 12. The tricuspid regurgitant velocity is 2.90 m/s, and with an assumed right atrial pressure of 3 mmHg, the estimated right ventricular systolic pressure is mildly elevated at 36.6 mmHg. 13. The inferior vena cava is normal in size with greater than 50% respiratory variability, suggesting right atrial pressure of 3 mmHg. FINDINGS  Left Ventricle: Left ventricular ejection fraction, by visual estimation, is 60 to 65%. The left ventricle has normal function. The left ventricle has no regional wall motion abnormalities. There is no left ventricular hypertrophy. Left ventricular diastolic parameters  were normal. Normal left atrial pressure. Right Ventricle: The right ventricular size is normal. No increase in right ventricular wall thickness. Global RV systolic function is has normal systolic function. The tricuspid regurgitant velocity is 2.90 m/s, and with an assumed right atrial pressure  of 3 mmHg, the estimated right ventricular systolic pressure is mildly elevated at 36.6 mmHg. Left Atrium: Left atrial size was normal in size. Right Atrium: Right atrial size was normal in size Pericardium: There is no evidence of pericardial effusion. Mitral Valve: The mitral valve is normal in structure. There is mild late systolic prolapse of of the mitral valve. Trivial mitral valve regurgitation. No evidence of mitral valve stenosis by observation. Tricuspid Valve: The tricuspid valve is normal in structure. Tricuspid valve regurgitation is trivial. Aortic Valve: The aortic valve is normal in structure. Aortic valve regurgitation is not visualized. The aortic valve is structurally normal, with no evidence of sclerosis or stenosis. Pulmonic Valve: The pulmonic valve was normal in structure. Pulmonic valve regurgitation is not visualized. Pulmonic regurgitation is not visualized. Aorta: The aortic root, ascending aorta and aortic arch are all structurally normal, with no evidence of dilitation or obstruction. Venous: The inferior vena cava is normal in size with greater than 50% respiratory variability, suggesting right atrial pressure of 3 mmHg. IAS/Shunts: No atrial level shunt detected by color flow Doppler. There is no evidence of a patent foramen ovale. No ventricular septal defect is seen or detected. There is no evidence of an atrial septal defect.  LEFT VENTRICLE PLAX 2D LVIDd:         4.00 cm  Diastology LVIDs:         2.70 cm  LV e' lateral:   8.38 cm/s LV PW:         1.10 cm  LV E/e' lateral: 11.9 LV IVS:        0.70 cm  LV e' medial:    8.49 cm/s LVOT diam:     1.80 cm  LV E/e' medial:  11.8 LV SV:          43 ml LV SV Index:   24.19    2D Longitudinal Strain LVOT Area:     2.54 cm 2D Strain GLS (A2C):   -21.5 %                         2D Strain GLS (A3C):   -23.7 %                         2D Strain GLS (A4C):   -23.0 %  2D Strain GLS Avg:     -22.8 %                          3D Volume EF:                         3D EF:        58 %                         LV EDV:       89 ml                         LV ESV:       38 ml                         LV SV:        52 ml RIGHT VENTRICLE RV Basal diam:  3.50 cm RV S prime:     12.50 cm/s TAPSE (M-mode): 2.5 cm RVSP:           41.6 mmHg LEFT ATRIUM             Index       RIGHT ATRIUM           Index LA diam:        3.00 cm 1.71 cm/m  RA Pressure: 8.00 mmHg LA Vol (A2C):   19.8 ml 11.31 ml/m RA Area:     7.60 cm LA Vol (A4C):   17.2 ml 9.83 ml/m  RA Volume:   14.20 ml  8.11 ml/m LA Biplane Vol: 20.0 ml 11.43 ml/m  AORTIC VALVE LVOT Vmax:   91.70 cm/s LVOT Vmean:  59.000 cm/s LVOT VTI:    0.207 m  AORTA Ao Root diam: 3.10 cm Ao Asc diam:  2.80 cm MITRAL VALVE                         TRICUSPID VALVE                                      TR Peak grad:   33.6 mmHg                                      TR Vmax:        290.00 cm/s MV Decel Time: 173 msec              Estimated RAP:  8.00 mmHg MV E velocity: 100.00 cm/s 103 cm/s  RVSP:           41.6 mmHg MV A velocity: 118.00 cm/s 70.3 cm/s MV E/A ratio:  0.85        1.5       SHUNTS                                      Systemic VTI:  0.21 m  Systemic Diam: 1.80 cm  Sanda Klein MD Electronically signed by Sanda Klein MD Signature Date/Time: 04/13/2019/3:45:07 PM    Final     Reassuring results  Mild MVP   She was told that her chest symptoms were unlikely cardiac in nature  Thought MSK possibly  (costochondritis) Was tx with a prednisone taper   Today:  Wt Readings from Last 3 Encounters:  05/03/19 149 lb 4 oz (67.7 kg)  04/20/19 150 lb 3.2 oz (68.1 kg)    04/11/19 150 lb (68 kg)   24.84 kg/m   Pulse ox is 99% on RA  BP Readings from Last 3 Encounters:  05/03/19 (!) 104/58  04/20/19 (!) 149/72  04/06/19 130/70   Pulse Readings from Last 3 Encounters:  05/03/19 72  04/20/19 77  04/06/19 80   H/o partial mastectomy in 2016   Lab Results  Component Value Date   CREATININE 1.19 (H) 01/10/2019   BUN 22 01/10/2019   NA 142 01/10/2019   K 4.2 01/10/2019   CL 109 01/10/2019   CO2 27 01/10/2019   Lab Results  Component Value Date   WBC 6.4 01/10/2019   HGB 13.5 01/10/2019   HCT 41.3 01/10/2019   MCV 91.6 01/10/2019   PLT 221 01/10/2019    Pt states that her symptoms improved with prednisone initially  Then when dose decreased they came down   Chest pressure is back - not as bad -and in a smaller area of anterior chest  Very weak feeling  Gives out easily with exertion - very tired  She does need to take a deep breath at times during speech Pulse ox is normal- and has been at home   Does not think anxiety caused any sob  No longer coughing   She cannot breathe well in cold air   She walks around the house for exercise   Drinking hot tea   Patient Active Problem List   Diagnosis Date Noted  . Anterior chest wall pain 04/06/2019  . Chest pain of unknown etiology -low risk for Cardiac Etiology 04/06/2019  . Shortness of breath on exertion 04/06/2019  . Osteopenia 05/02/2018  . Routine general medical examination at a health care facility 11/21/2016  . Estrogen deficiency 11/19/2016  . Renal insufficiency 11/19/2016  . Pedal edema 12/15/2015  . Family history of breast cancer in female 05/17/2014  . Malignant neoplasm of upper-outer quadrant of right breast in female, estrogen receptor positive ([place]) 05/08/2014  . GERD (gastroesophageal reflux disease) 03/01/2014  . Lumbar degenerative disc disease 08/15/2013  . Chronic pain of scapula 08/15/2013  . Essential hypertension 10/02/2008  . Migraine 09/06/2008  .  Vitamin D deficiency 06/27/2007  . HYPERLIPIDEMIA 05/11/2007  . Generalized anxiety disorder 05/11/2007  . Disorder of bone and cartilage 05/11/2007  . H/O poliomyelitis 05/08/2007  . OSTEOARTHRITIS, HANDS, BILATERAL 05/08/2007  . Fibromyalgia 05/08/2007  . URINARY INCONTINENCE, STRESS, MILD 05/08/2007   Past Medical History:  Diagnosis Date  . Anxiety   . Arthritis    OA of hands  . Breast cancer of upper-outer quadrant of right female breast ([place]) 05/08/2014  . DVT (deep venous thrombosis) ([place]) 1980s  . Fibromyalgia   . Hyperlipidemia   . Irritable bowel syndrome   . Migraine   . Osteopenia   . S/P radiation therapy 06/2014   Right breast 4250 cGy in 17 sessions, right breast boost 1200 cGy in 6 sessions= 07/11/2014 through 08/12/2014   . Vitamin D deficiency   . Wears  glasses    Past Surgical History:  Procedure Laterality Date  . ABDOMINAL HYSTERECTOMY  1972  . APPENDECTOMY    . BREAST EXCISIONAL BIOPSY Left   . BREAST LUMPECTOMY Right 2016  . BREAST SURGERY  1995   lumpectomy fibrocystic breast-lt  . CATARACT EXTRACTION     both  . CHOLECYSTECTOMY  2005  . COLONOSCOPY    . ovarian cyst removed  1985  . RADIOACTIVE SEED GUIDED PARTIAL MASTECTOMY WITH AXILLARY SENTINEL LYMPH NODE BIOPSY Right 05/30/2014   Procedure: RADIOACTIVE SEED GUIDED PARTIAL MASTECTOMY WITH AXILLARY SENTINEL LYMPH NODE BIOPSY;  Surgeon: Autumn Messing III, MD;  Location: [facility];  Service: General;  Laterality: Right;   Social History   Tobacco Use  . Smoking status: Never Smoker  . Smokeless tobacco: Never Used  . Tobacco comment: non smoker  Substance Use Topics  . Alcohol use: No    Alcohol/week: 0.0 standard drinks  . Drug use: No   Family History  Problem Relation Age of Onset  . Hypertension Father   . Cancer Father 92       esophageal - smoker  . Leukemia Mother   . Cancer Mother 50       leukemia  . Cancer Brother 38       breast CA  .  Breast cancer Brother 83  . Cancer Maternal Aunt 80       leukemia  . Cancer Paternal Aunt 78       breast  . Breast cancer Paternal Aunt 69  . Breast cancer Maternal Grandmother 80   Allergies  Allergen Reactions  . Amoxicillin Itching    REACTION: reacts opposite effect made infection worse.  Marland Kitchen Amoxicillin-Pot Clavulanate     REACTION: Felt like body turned inside out  . Esomeprazole Magnesium     REACTION: Nausea  . Gabapentin   . Omeprazole Nausea And Vomiting    REACTION: not effective  . Ranitidine Hcl     REACTION: not effective   Current Outpatient Medications on File Prior to Visit  Medication Sig Dispense Refill  . albuterol (VENTOLIN HFA) 108 (90 Base) MCG/ACT inhaler Inhale 2 puffs into the lungs every 4 (four) hours as needed for wheezing or shortness of breath (cough, shortness of breath or wheezing.). 18 g 0  . amitriptyline (ELAVIL) 10 MG tablet Take 2 tablets (20 mg total) by mouth at bedtime. 60 tablet 11  . amLODipine (NORVASC) 5 MG tablet TAKE 1 TABLET (5 MG TOTAL) BY MOUTH DAILY. 30 tablet 0  . Ascorbic Acid (VITAMIN C) 100 MG tablet Take 100 mg by mouth daily.    Marland Kitchen aspirin EC 81 MG tablet Take 81 mg by mouth daily.    . calcium carbonate 200 MG capsule Take 250 mg by mouth 2 (two) times daily.    . Cholecalciferol (VITAMIN D PO) Take 1 tablet by mouth 2 (two) times daily. Pt takes 2000 units daily    . fluticasone (FLONASE) 50 MCG/ACT nasal spray USE 1 SPRAY IN EACH NOSTRIL DAILY AS DIRECTED 16 g 11  . Spacer/Aero-Holding Chambers (AEROCHAMBER MINI CHAMBER) DEVI For use with albuterol inhaler 1 each 0  . SUMAtriptan (IMITREX) 100 MG tablet TAKE 1 TABLET BY MOUTH AS NEEDED FOR MIGRAINE, MAY REPEAT IN 2 HOURS IF NEEDED. MAX 2 TABS IN 24 HOURS. 9 tablet 3  . tamoxifen (NOLVADEX) 20 MG tablet TAKE ONE TABLET BY MOUTH ONE TIME DAILY 90 tablet 0   No current facility-administered medications on  file prior to visit.    Review of Systems  Constitutional: Positive  for fatigue. Negative for activity change, appetite change, fever and unexpected weight change.  HENT: Negative for congestion, ear pain, rhinorrhea, sinus pressure and sore throat.   Eyes: Negative for pain, redness and visual disturbance.  Respiratory: Positive for chest tightness and shortness of breath. Negative for cough, wheezing and stridor.   Cardiovascular: Positive for chest pain. Negative for palpitations and leg swelling.  Gastrointestinal: Negative for abdominal pain, blood in stool, constipation and diarrhea.  Endocrine: Negative for polydipsia and polyuria.  Genitourinary: Negative for dysuria, frequency and urgency.  Musculoskeletal: Positive for arthralgias and myalgias. Negative for back pain and joint swelling.  Skin: Negative for pallor and rash.  Allergic/Immunologic: Negative for environmental allergies.  Neurological: Negative for dizziness, syncope and headaches.  Hematological: Negative for adenopathy. Does not bruise/bleed easily.  Psychiatric/Behavioral: Negative for decreased concentration and dysphoric mood. The patient is not nervous/anxious.        Objective:   Physical Exam Constitutional:      General: She is not in acute distress.    Appearance: Normal appearance. She is well-developed and normal weight. She is not ill-appearing or diaphoretic.  HENT:     Head: Normocephalic and atraumatic.     Mouth/Throat:     Mouth: Mucous membranes are moist.  Eyes:     General: No scleral icterus.       Right eye: No discharge.        Left eye: No discharge.     Conjunctiva/sclera: Conjunctivae normal.     Pupils: Pupils are equal, round, and reactive to light.  Neck:     Thyroid: No thyromegaly.     Vascular: No carotid bruit or JVD.  Cardiovascular:     Rate and Rhythm: Normal rate and regular rhythm.     Heart sounds: Normal heart sounds. No gallop.   Pulmonary:     Effort: Pulmonary effort is normal. No respiratory distress.     Breath sounds: Normal  breath sounds. No stridor. No wheezing, rhonchi or rales.     Comments: Mild sternal/anterior rib tenderness No crepitus or step off No skin changes  Abdominal:     General: Bowel sounds are normal. There is no distension or abdominal bruit.     Palpations: Abdomen is soft. There is no mass.     Tenderness: There is no abdominal tenderness. There is no right CVA tenderness or left CVA tenderness.     Hernia: No hernia is present.  Musculoskeletal:     Cervical back: Normal range of motion and neck supple. No tenderness.     Right lower leg: No edema.     Left lower leg: No edema.     Comments: No palp cords in legs No erythema /swelling or tenderness Neg HOman's sign  Lymphadenopathy:     Cervical: No cervical adenopathy.  Skin:    General: Skin is warm and dry.     Coloration: Skin is not pale.     Findings: No erythema or rash.  Neurological:     Mental Status: She is alert. Mental status is at baseline.     Cranial Nerves: No cranial nerve deficit.     Motor: No weakness.     Coordination: Coordination normal.     Gait: Gait normal.     Deep Tendon Reflexes: Reflexes are normal and symmetric. Reflexes normal.  Psychiatric:        Mood and Affect: Mood  is anxious.        Cognition and Memory: Cognition and memory normal.     Comments: Fatigued and mildly anxious  Pleasant            Assessment & Plan:   Problem List Items Addressed This Visit      Cardiovascular and Mediastinum   Essential hypertension (Chronic)    bp in fair control at this time  BP Readings from Last 1 Encounters:  05/03/19 (!) 104/58   No changes needed Most recent labs reviewed  Disc lifstyle change with low sodium diet and exercise        Relevant Orders   CBC with Differential/Platelet (Completed)   Basic metabolic panel (Completed)     Other   Anterior chest wall pain    Pt continues to have chest wall tenderness today- per pt improved after prednisone but still bothersome    Would consider longer prednisone course if no further improvement       Relevant Orders   CT ANGIO CHEST PE W OR WO CONTRAST   Chest pain of unknown etiology -low risk for Cardiac Etiology    Reassuring cxr, vitals and cardiac w/u  Possibly post viral syndrome (also fatigue) CT angio ordered to r/o PE or other finding  Still sob on exertion but not hypoxic       Relevant Orders   CT ANGIO CHEST PE W OR WO CONTRAST   CBC with Differential/Platelet (Completed)   Basic metabolic panel (Completed)   Shortness of breath on exertion    Ongoing since uri (neg covid test) - and with continued fatigue Very reassuring exam , vitals (pulse ox ), cxr and cardiac w/u  CT angio ordered  Expect further gradual improvement  Some features of costochondritis ( may need additional prednisone)  Labs today (cbc and chem)       Other Visit Diagnoses    Shortness of breath    -  Primary   Relevant Orders   CT ANGIO CHEST PE W OR WO CONTRAST   CBC with Differential/Platelet (Completed)   Basic metabolic panel (Completed)

## 2019-05-03 NOTE — Assessment & Plan Note (Signed)
bp in fair control at this time  BP Readings from Last 1 Encounters:  05/03/19 (!) 104/58   No changes needed Most recent labs reviewed  Disc lifstyle change with low sodium diet and exercise

## 2019-05-03 NOTE — Assessment & Plan Note (Signed)
Reassuring cxr, vitals and cardiac w/u  Possibly post viral syndrome (also fatigue) CT angio ordered to r/o PE or other finding  Still sob on exertion but not hypoxic

## 2019-05-05 ENCOUNTER — Ambulatory Visit: Payer: Medicare Other | Attending: Internal Medicine

## 2019-05-05 DIAGNOSIS — Z23 Encounter for immunization: Secondary | ICD-10-CM

## 2019-05-05 NOTE — Progress Notes (Signed)
   Covid-19 Vaccination Clinic  Name:  AREANA BROSSETT    MRN: [id number] DOB: [birth year]  05/05/2019  Ms. Maden was observed post Covid-19 immunization for 15 minutes without incidence. She was provided with Vaccine Information Sheet and instruction to access the V-Safe system.   Ms. [place] was instructed to call 911 with any severe reactions post vaccine: Marland Kitchen Difficulty breathing  . Swelling of your face and throat  . A fast heartbeat  . A bad rash all over your body  . Dizziness and weakness    Immunizations Administered    Name Date Dose VIS Date Route   Pfizer COVID-19 Vaccine 05/05/2019  3:43 PM 0.3 mL 03/09/2019 Intramuscular   Manufacturer: [employer]   Lot: [id number]   [place]: [id number]

## 2019-05-07 ENCOUNTER — Encounter: Payer: Self-pay | Admitting: Family Medicine

## 2019-05-07 ENCOUNTER — Telehealth: Payer: Self-pay | Admitting: Family Medicine

## 2019-05-07 ENCOUNTER — Ambulatory Visit (INDEPENDENT_AMBULATORY_CARE_PROVIDER_SITE_OTHER)
Admission: RE | Admit: 2019-05-07 | Discharge: 2019-05-07 | Disposition: A | Discharge status: Home / Self Care | Payer: Medicare Other | Source: Ambulatory Visit | Attending: Family Medicine | Admitting: Family Medicine

## 2019-05-07 ENCOUNTER — Ambulatory Visit (INDEPENDENT_AMBULATORY_CARE_PROVIDER_SITE_OTHER): Payer: Medicare Other | Admitting: Family Medicine

## 2019-05-07 ENCOUNTER — Other Ambulatory Visit: Payer: Self-pay

## 2019-05-07 VITALS — BP 134/70 | HR 90 | Temp 97.4°F | Ht 65.0 in | Wt 150.5 lb

## 2019-05-07 DIAGNOSIS — R079 Chest pain, unspecified: Secondary | ICD-10-CM

## 2019-05-07 DIAGNOSIS — D6859 Other primary thrombophilia: Secondary | ICD-10-CM | POA: Diagnosis not present

## 2019-05-07 DIAGNOSIS — I2699 Other pulmonary embolism without acute cor pulmonale: Secondary | ICD-10-CM | POA: Diagnosis not present

## 2019-05-07 DIAGNOSIS — Z86711 Personal history of pulmonary embolism: Secondary | ICD-10-CM | POA: Diagnosis not present

## 2019-05-07 DIAGNOSIS — R0602 Shortness of breath: Secondary | ICD-10-CM

## 2019-05-07 DIAGNOSIS — R0789 Other chest pain: Secondary | ICD-10-CM | POA: Diagnosis not present

## 2019-05-07 DIAGNOSIS — I82402 Acute embolism and thrombosis of unspecified deep veins of left lower extremity: Secondary | ICD-10-CM | POA: Diagnosis not present

## 2019-05-07 MED ORDER — IOHEXOL 350 MG/ML SOLN
64.0000 mL | Freq: Once | INTRAVENOUS | Status: AC | PRN
  Administered 2019-05-07: 10:00:00 64 mL via INTRAVENOUS

## 2019-05-07 MED ORDER — RIVAROXABAN (XARELTO) VTE STARTER PACK (15 & 20 MG): ORAL_TABLET | ORAL | 0 refills | Status: DC

## 2019-05-07 NOTE — Assessment & Plan Note (Signed)
PE seen on CTA  tx with xarelto

## 2019-05-07 NOTE — Telephone Encounter (Signed)
Call from radiology-pt has a subacute PE without evidence of R heart strain on her CT angiogram   Spoke to pt-she feels about the same/no changes  Rev hx and she did have DVT in the 1980s   Sent in xarelto to her pharnacy If not affordable- asked to call so we will change  Will start with xarelto 15 mg bid with food for 21 d  Then change to 20 mg daily   Will schedule f/u in office- discuss poss of hypercoag w/u  Also ? If possible DVT (no symptoms) and follow closely   inst if inc in sob or any other symptoms like cp to go to ER-she voiced understanding      [place]-please call her to schedule appt in office in the next several days and also tell her to stop her aspirin when on the xarelto

## 2019-05-07 NOTE — Assessment & Plan Note (Addendum)
On CTA after episode of sob with chest wall discomfort (neg cardiac w/u)  No signs of R heart strain  Nl vitals and pulse ox  xarelto px- 15 mg bid for 21 d ten 20 mg daily  Of note-pt had DVT in the 1980s  Also taking tamoxifen for breast cancer (in 5th year)  Disc all these factors to consider when deciding on length of tx  hypercoag panel drawn today  Will order venous dopplers of LE  (? If mild R posterior thigh tenderness on exam today)  inst pt to hold her tamoxifen for now and will update oncology If suddenly worse sob - inst to go to ER /pt voiced understanding

## 2019-05-07 NOTE — Assessment & Plan Note (Signed)
Suspect due to subacute PE  tx with xarelto

## 2019-05-07 NOTE — Patient Instructions (Signed)
Take the generic xarelto as directed  If any problems let us know  You will bleed and bruise easier   Hold your tamoxifen for now until I run it by your oncologist   Labs today to look for clotting problem   We will work on scheduling an ultrasound (doppler) of your legs to look for a DVT (clot) as well   If you suddenly feel worse - call us/go to the ER

## 2019-05-07 NOTE — Progress Notes (Signed)
Subjective:    Patient ID: Sarah Young, female    DOB: 06-05-1940, 79 y.o.   MRN: [id number]  This visit occurred during the SARS-CoV-2 public health emergency.  Safety protocols were in place, including screening questions prior to the visit, additional usage of staff PPE, and extensive cleaning of exam room while observing appropriate contact time as indicated for disinfecting solutions.    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  She was treated with coumadin  She was on hormones at that time (had hysterectomy at 8)  She is taking tamoxifen - 5 years  Was planning to stop it this summer most likely  H/o breast cancer Sees Dr Jana Hakim in June   No symptoms in legs at all  Cramps are not unusual for her     Wt Readings from Last 3 Encounters:  05/07/19 150 lb 8 oz (68.3 kg)  05/03/19 149 lb 4 oz (67.7 kg)  04/20/19 150 lb 3.2 oz (68.1 kg)   25.04 kg/m   BP Readings from Last 3 Encounters:  05/07/19 (!) 144/68  05/03/19 (!) 104/58  04/20/19 (!) 149/72   Pulse Readings from Last 3 Encounters:  05/07/19 90  05/03/19 72  04/20/19 77    Pulse ox is 96% on RA today  Recent cardiac w/u is negative   CTA:  CT ANGIO CHEST PE W OR WO CONTRAST (Accession [id number]) (Order [id number]) Imaging Date: 05/07/2019 Department: [facility] [place] Released By: Minus Liberty Authorizing: Ashaz Robling, Wynelle Fanny, MD  Exam Status  Status  Final [99]  PACS Inteler[facility] Image Link  Show images for CT ANGIO CHEST PE W OR WO CONTRAST  Study Result  CLINICAL DATA:  Shortness of breath, orthopnea.  EXAM: CT ANGIOGRAPHY CHEST WITH CONTRAST  TECHNIQUE: Multidetector CT imaging of the chest was performed using the standard protocol during bolus administration of intravenous contrast. Multiplanar CT image reconstructions and MIPs  were obtained to evaluate the vascular anatomy.  CONTRAST:  36mL OMNIPAQUE IOHEXOL 350 MG/ML SOLN  COMPARISON:  None.  FINDINGS: Cardiovascular: There are filling defects in the pulmonary arteries bilaterally, which appear mostly peripheral and segmental/subsegmental in location. Heart size normal. No pericardial effusion.  Mediastinum/Nodes: No pathologically enlarged mediastinal, hilar or axillary lymph nodes. Esophagus is grossly unremarkable.  Lungs/Pleura: Probable subpleural radiation fibrosis in the anterior right lung in this patient with a history of right breast lumpectomy. Lungs are otherwise clear. Trace right pleural fluid. Airway is unremarkable.  Upper Abdomen: Low-attenuation lesion in the dome of the liver measures 1.7 cm and is likely a cyst. Visualized portions of the liver, adrenal glands and right kidney are otherwise unremarkable. 1.4 cm low-density lesion off the upper pole left kidney is likely a cyst. Visualized portions of the spleen, pancreas, stomach and bowel are otherwise grossly unremarkable. No upper abdominal adenopathy.  Musculoskeletal: No worrisome lytic or sclerotic lesions.  Review of the MIP images confirms the above findings.  IMPRESSION: 1. Positive for subacute bilateral pulmonary emboli, mainly peripheral and segmental/subsegmental in location. No evidence right heart strain. Critical Value/emergent results were called by telephone at the time of interpretation on 05/07/2019 at 10:46 am to provider [facility] , who verbally acknowledged these results. 2. Trace right pleural fluid.   Electronically Signed   By: Lorin Picket M.D.   On: 05/07/2019 10:48  Lab Results  Component Value Date   ALT 10 01/10/2019   AST 18 01/10/2019   ALKPHOS 45 01/10/2019   BILITOT 0.3 01/10/2019    Lab Results  Component Value Date   WBC 11.1 (H) 05/03/2019   HGB 14.0 05/03/2019   HCT 42.7 05/03/2019   MCV 90.3 05/03/2019    PLT 165.0 05/03/2019   Patient Active Problem List   Diagnosis Date Noted  . Subacute pulmonary embolism ([place]) 05/07/2019  . Anterior chest wall pain 04/06/2019  . Chest pain of unknown etiology -low risk for Cardiac Etiology 04/06/2019  . Shortness of breath on exertion 04/06/2019  . Osteopenia 05/02/2018  . Routine general medical examination at a health care facility 11/21/2016  . Estrogen deficiency 11/19/2016  . Renal insufficiency 11/19/2016  . Pedal edema 12/15/2015  . Family history of breast cancer in female 05/17/2014  . GERD (gastroesophageal reflux disease) 03/01/2014  . Lumbar degenerative disc disease 08/15/2013  . Chronic pain of scapula 08/15/2013  . Essential hypertension 10/02/2008  . Migraine 09/06/2008  . Vitamin D deficiency 06/27/2007  . HYPERLIPIDEMIA 05/11/2007  . Generalized anxiety disorder 05/11/2007  . Disorder of bone and cartilage 05/11/2007  . H/O poliomyelitis 05/08/2007  . OSTEOARTHRITIS, HANDS, BILATERAL 05/08/2007  . Fibromyalgia 05/08/2007  . URINARY INCONTINENCE, STRESS, MILD 05/08/2007   Past Medical History:  Diagnosis Date  . Anxiety   . Arthritis    OA of hands  . Breast cancer of upper-outer quadrant of right female breast ([place]) 05/08/2014  . DVT (deep venous thrombosis) ([place]) 1980s  . Fibromyalgia   . Hyperlipidemia   . Irritable bowel syndrome   . Migraine   . Osteopenia   . S/P radiation therapy 06/2014   Right breast 4250 cGy in 17 sessions, right breast boost 1200 cGy in 6 sessions= 07/11/2014 through 08/12/2014   . Vitamin D deficiency   . Wears glasses    Past Surgical History:  Procedure Laterality Date  . ABDOMINAL HYSTERECTOMY  1972  . APPENDECTOMY    . BREAST EXCISIONAL BIOPSY Left   . BREAST LUMPECTOMY Right 2016  . BREAST SURGERY  1995   lumpectomy fibrocystic breast-lt  . CATARACT EXTRACTION     both  . CHOLECYSTECTOMY  2005  . COLONOSCOPY    . ovarian cyst removed  1985  .  RADIOACTIVE SEED GUIDED PARTIAL MASTECTOMY WITH AXILLARY SENTINEL LYMPH NODE BIOPSY Right 05/30/2014   Procedure: RADIOACTIVE SEED GUIDED PARTIAL MASTECTOMY WITH AXILLARY SENTINEL LYMPH NODE BIOPSY;  Surgeon: Autumn Messing III, MD;  Location: [facility];  Service: General;  Laterality: Right;   Social History   Tobacco Use  . Smoking status: Never Smoker  . Smokeless tobacco: Never Used  . Tobacco comment: non smoker  Substance Use Topics  . Alcohol use: No    Alcohol/week: 0.0 standard drinks  . Drug use: No   Family History  Problem Relation Age of Onset  . Hypertension Father   . Cancer Father 22       esophageal - smoker  . Leukemia Mother   . Cancer Mother 6       leukemia  . Cancer Brother 80       breast CA  . Breast cancer Brother 52  . Cancer Maternal Aunt 80       leukemia  . Cancer Paternal Aunt 70       breast  . Breast cancer Paternal Aunt 71  . Breast cancer Maternal Grandmother 80   Allergies  Allergen Reactions  . Amoxicillin Itching    REACTION: reacts opposite effect made infection worse.  Marland Kitchen Amoxicillin-Pot Clavulanate     REACTION: Felt like body turned inside out  . Esomeprazole Magnesium     REACTION: Nausea  . Gabapentin   . Omeprazole Nausea And Vomiting    REACTION: not effective  . Ranitidine Hcl     REACTION: not effective   Current Outpatient Medications on File Prior to Visit  Medication Sig Dispense Refill  . albuterol (VENTOLIN HFA) 108 (90 Base) MCG/ACT inhaler Inhale 2 puffs into the lungs every 4 (four) hours as needed for wheezing or shortness of breath (cough, shortness of breath or wheezing.). 18 g 0  . amitriptyline (ELAVIL) 10 MG tablet Take 2 tablets (20 mg total) by mouth at bedtime. 60 tablet 11  . amLODipine (NORVASC) 5 MG tablet TAKE 1 TABLET (5 MG TOTAL) BY MOUTH DAILY. 30 tablet 0  . Ascorbic Acid (VITAMIN C) 100 MG tablet Take 100 mg by mouth daily.    . calcium carbonate 200 MG capsule Take 250 mg by mouth 2  (two) times daily.    . Cholecalciferol (VITAMIN D PO) Take 1 tablet by mouth 2 (two) times daily. Pt takes 2000 units daily    . fluticasone (FLONASE) 50 MCG/ACT nasal spray USE 1 SPRAY IN EACH NOSTRIL DAILY AS DIRECTED 16 g 11  . Rivaroxaban 15 & 20 MG TBPK Follow package directions: Take one 15mg  tablet by mouth twice a day. On day 22, switch to one 20mg  tablet once a day. Take with food. 51 each 0  . Spacer/Aero-Holding Chambers (AEROCHAMBER MINI CHAMBER) DEVI For use with albuterol inhaler 1 each 0  . SUMAtriptan (IMITREX) 100 MG tablet TAKE 1 TABLET BY MOUTH AS NEEDED FOR MIGRAINE, MAY REPEAT IN 2 HOURS IF NEEDED. MAX 2 TABS IN 24 HOURS. 9 tablet 3  . tamoxifen (NOLVADEX) 20 MG tablet TAKE ONE TABLET BY MOUTH ONE TIME DAILY 90 tablet 0   No current facility-administered medications on file prior to visit.     Review of Systems  Constitutional: Negative for activity change, appetite change, fatigue, fever and unexpected weight change.  HENT: Negative for congestion, ear pain, rhinorrhea, sinus pressure and sore throat.   Eyes: Negative for pain, redness and visual disturbance.  Respiratory: Positive for chest tightness and shortness of breath. Negative for cough, wheezing and stridor.   Cardiovascular: Positive for chest pain. Negative for palpitations and leg swelling.  Gastrointestinal: Negative for abdominal pain, blood in stool, constipation and diarrhea.  Endocrine: Negative for polydipsia and polyuria.  Genitourinary: Negative for dysuria, frequency and urgency.  Musculoskeletal: Negative for arthralgias, back pain and myalgias.  Skin: Negative for pallor and rash.  Allergic/Immunologic: Negative for environmental allergies.  Neurological: Negative for dizziness, syncope and headaches.  Hematological: Negative for adenopathy. Does not bruise/bleed easily.  Psychiatric/Behavioral: Negative for decreased concentration and dysphoric mood. The patient is not nervous/anxious.          Objective:   Physical Exam Constitutional:      General: She is not in acute distress.    Appearance: Normal appearance. She is normal weight. She is not ill-appearing or diaphoretic.  HENT:     Head: Normocephalic and atraumatic.     Mouth/Throat:     Mouth: Mucous membranes are moist.  Eyes:     General: No scleral icterus.    Pupils: Pupils are equal, round, and reactive to light.  Neck:     Vascular:  No carotid bruit.  Cardiovascular:     Rate and Rhythm: Normal rate and regular rhythm.     Pulses: Normal pulses.     Heart sounds: Normal heart sounds.  Pulmonary:     Effort: Pulmonary effort is normal. No respiratory distress.     Breath sounds: Normal breath sounds. No stridor. No wheezing, rhonchi or rales.  Musculoskeletal:     Cervical back: Normal range of motion and neck supple. No tenderness.     Right lower leg: No edema.     Left lower leg: No edema.     Comments: Very slight tenderness with palp of R posterior thigh No swelling/ palp cord/erythema or warmth  Neg Homan's sign bilat   Lymphadenopathy:     Cervical: No cervical adenopathy.  Skin:    Findings: No erythema or rash.  Neurological:     Mental Status: She is alert.     Coordination: Coordination normal.     Deep Tendon Reflexes: Reflexes normal.  Psychiatric:        Mood and Affect: Mood normal.           Assessment & Plan:   Problem List Items Addressed This Visit      Cardiovascular and Mediastinum   Subacute pulmonary embolism ([place]) - Primary    On CTA after episode of sob with chest wall discomfort (neg cardiac w/u)  No signs of R heart strain  Nl vitals and pulse ox  xarelto px- 15 mg bid for 21 d ten 20 mg daily  Of note-pt had DVT in the 1980s  Also taking tamoxifen for breast cancer (in 5th year)  Disc all these factors to consider when deciding on length of tx  hypercoag panel drawn today  Will order venous dopplers of LE  (? If mild R posterior thigh tenderness on exam  today)  inst pt to hold her tamoxifen for now and will update oncology If suddenly worse sob - inst to go to ER /pt voiced understanding       Relevant Orders   Recurrent Miscarriage Eval/Coag Pnl     Other   Chest pain of unknown etiology -low risk for Cardiac Etiology    PE seen on CTA  tx with xarelto      Shortness of breath on exertion    Suspect due to subacute PE  tx with xarelto

## 2019-05-08 ENCOUNTER — Ambulatory Visit: Payer: Medicare Other

## 2019-05-08 ENCOUNTER — Ambulatory Visit (HOSPITAL_COMMUNITY)
Admission: RE | Admit: 2019-05-08 | Discharge: 2019-05-08 | Disposition: A | Discharge status: Home / Self Care | Payer: Medicare Other | Source: Ambulatory Visit | Attending: Internal Medicine | Admitting: Internal Medicine

## 2019-05-08 ENCOUNTER — Encounter (HOSPITAL_COMMUNITY): Payer: Self-pay

## 2019-05-08 DIAGNOSIS — I2699 Other pulmonary embolism without acute cor pulmonale: Secondary | ICD-10-CM | POA: Insufficient documentation

## 2019-05-08 NOTE — Progress Notes (Signed)
Bilateral lower extremity venous duplex performed. Evidence of DVT in the left posterior tibial veins. Preliminary results verbally relayed to Dr. Glori Bickers at 2:45 pm. Patient to continue treatment with Xarelto.   Mariane Masters, RVT

## 2019-05-11 ENCOUNTER — Other Ambulatory Visit: Payer: Self-pay | Admitting: Oncology

## 2019-05-12 ENCOUNTER — Other Ambulatory Visit: Payer: Self-pay | Admitting: Oncology

## 2019-05-12 LAB — RECURRENT MISCARRIAGE EVAL/COAG PNL
AntiThromb III Func: 109 % normal (ref 80–135)
Anticardiolipin IgA: <11 [APL'U] (ref <=11)
Anticardiolipin IgG: <14 [GPL'U] (ref <=14)
Anticardiolipin IgM: <12 [MPL'U] (ref <=12)
Beta-2 Glyco 1 IgA: <9 SAU (ref <=20)
Beta-2 Glyco 1 IgM: <9 SMU (ref <=20)
Beta-2 Glyco I IgG: <9 SGU (ref <=20)
Homocysteine: 14 umol/L — ABNORMAL HIGH (ref <10.4)
PHOSPHATIDYLSERINE AB  (IGG): <10 U/mL (ref <10)
PHOSPHATIDYLSERINE AB  (IGM): <25 U/mL (ref <25)
PTT-LA Screen: 48 s — ABNORMAL HIGH (ref <=40)
Protein C Activity: 184 % — ABNORMAL HIGH (ref 70–180)
Protein S Ag, Free: 99 % normal (ref 50–147)
dRVVT: 96 s — ABNORMAL HIGH (ref <=45)

## 2019-05-12 LAB — RFLX DRVVT CONFRIM: DRVVT CONFIRM: NEGATIVE

## 2019-05-12 LAB — RFLX HEXAGONAL PHASE CONFIRM: Hexagonal Phase Conf: NEGATIVE

## 2019-05-14 ENCOUNTER — Telehealth: Payer: Self-pay | Admitting: Oncology

## 2019-05-14 NOTE — Telephone Encounter (Signed)
Scheduled per 2/11 sch msg. Called and spoke with pt, confirmed 2/26 appt

## 2019-05-22 ENCOUNTER — Encounter: Payer: Self-pay | Admitting: Family Medicine

## 2019-05-22 DIAGNOSIS — D6859 Other primary thrombophilia: Secondary | ICD-10-CM | POA: Insufficient documentation

## 2019-05-24 ENCOUNTER — Other Ambulatory Visit: Payer: Self-pay | Admitting: Family Medicine

## 2019-05-24 NOTE — Progress Notes (Signed)
[facility]  [phone] [phone]     ID: Sarah Young DOB: 1940/06/26  MR#: [id number]  IRJ#:[id number]  Patient Care Team: Abner Greenspan, MD as PCP - [place], [place], MD as Consulting Physician (General Surgery) Mehtaab Mayeda, Virgie Dad, MD as Consulting Physician (Oncology) Arloa Koh, MD (Inactive) as Consulting Physician (Radiation Oncology) Rockwell Germany, RN as Registered Nurse Mauro Kaufmann, RN as Registered Nurse Holley Bouche, NP (Inactive) as Nurse Practitioner (Nurse Practitioner) Sylvan Cheese, NP as Nurse Practitioner (Nurse Practitioner)OTHER MD: Nena Polio M.D.  CHIEF COMPLAINT: Estrogen receptor positive breast cancer  CURRENT TREATMENT: Completed 4-1/2 years of tamoxifen.  On rivaroxaban   INTERVAL HISTORY: Sarah Young returns today for follow-up of her estrogen receptor positive breast cancer.  She has been on tamoxifen for this and she is also under intensified screening, with a bilateral breast MRI 02/01/2019 which showed no evidence of disease, breast density category B and an incidental benign-appearing 1.8 cm upper left hepatic lesion..  She is scheduled for and mammography in May.  In the week before Christmas December 2020 she developed some respiratory symptoms which she brought to Dr. Alba Cory attention.  She was tested for Covid and this was negative.  She was treated with Tessalon and doxycycline for a likely sinus infection, and symptoms did improve although they did not completely clear.  On 01/05/2021she was seen at a respiratory clinic and had a chest x-ray for evaluation of her shortness of breath which showed no acute disease.  She was advised to see a cardiologist.  She saw Glenetta Hew on 04/06/2019 for her complaints of chest tightness and orthopnea.  He obtained an EKG which was remarkable only for low voltage and an echo 04/13/2019 which showed an ejection fraction in the 60% range without  wall motion abnormalities.  A Lexiscan Myoview was performed on 04/12/2019 and was read as low risk/normal.  Her shortness of breath was thought to be residual from her recent upper respiratory infection and a steroid taper was prescribed.  Again she experienced some initial improvement but not clearance of the symptoms  The patient then saw Dr. Glori Bickers again on 05/03/2019.  She found the exam reassuring but obtained a CT angio just to make sure.  This was performed 05/07/2019 and showed subacute bilateral pulmonary emboli, peripheral and segmental/subsegmental in distribution.  There was no right heart strain.  The patient was started on rivaroxaban the same day and underwent bilateral lower extremity Dopplers 05/08/2019, showing left posterior tibial DVT.  She was referred here for additional management.  Her tamoxifen was stopped at the time of PE diagnosis.  She had no significant symptoms from this medication so she has not noted a change since going off it.  REVIEW OF SYSTEMS: Sarah Young is having no bleeding complications from the rivaroxaban.  She tells me she is finally beginning to feel better and for the last couple of days she has "looked normal".  She still has a very minimal sense of pressure when she takes a deep breath.  She has occasional dry cough paroxysms.  She has had no change in taste, appetite, weight, or bowel habits.  There has been no unusual pain.  A detailed review of systems today was otherwise noncontributory   BREAST CANCER HISTORY: From the original intake note:  Sarah Young had routine bilateral screening mamm[facility]. This found the breast density to be category C. A possible mass  was noted in the right breast, and on 05/03/2014 she underwent right diagnostic mammography with ultrasonography. There was a spiculated mass in the upper outer quadrant of the right breast which was new since the prior mammogram. On physical exam there  was mild nodularity noted in the area in question. Ultrasound confirmed a hypoechoic irregular mass measuring 0.9 cm in the upper outer quadrant.  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  MRI of the breasts is scheduled for 05/20/2014. The patient's subsequent history is as detailed below   PAST MEDICAL HISTORY: Past Medical History:  Diagnosis Date   Anxiety    Arthritis    OA of hands   Breast cancer of upper-outer quadrant of right female breast ([facility]) 05/08/2014   DVT (deep venous thrombosis) (HCC) 1980s   Fibromyalgia    Hyperlipidemia    Irritable bowel syndrome    Migraine    Osteopenia    S/P radiation therapy 06/2014   Right breast 4250 cGy in 17 sessions, right breast boost 1200 cGy in 6 sessions= 07/11/2014 through 08/12/2014    Vitamin D deficiency    Wears glasses     PAST SURGICAL HISTORY: Past Surgical History:  Procedure Laterality Date   ABDOMINAL HYSTERECTOMY  1972   APPENDECTOMY     BREAST EXCISIONAL BIOPSY Left    BREAST LUMPECTOMY Right 2016   BREAST SURGERY  1995   lumpectomy fibrocystic breast-lt   CATARACT EXTRACTION     both   CHOLECYSTECTOMY  2005   COLONOSCOPY     ovarian cyst removed  [place] PARTIAL MASTECTOMY WITH AXILLARY SENTINEL LYMPH NODE BIOPSY Right 05/30/2014   Procedure: RADIOACTIVE SEED GUIDED PARTIAL MASTECTOMY WITH AXILLARY SENTINEL LYMPH NODE BIOPSY;  Surgeon: Autumn Messing III, MD;  Location: [facility];  Service: General;  Laterality: Right;    FAMILY HISTORY Family History  Problem Relation Age of Onset   Hypertension Father    Cancer Father 76       esophageal - smoker     Leukemia Mother    Cancer Mother 9       leukemia   Cancer Brother 27       breast CA   Breast cancer Brother 74   Cancer Maternal Aunt 18       leukemia   Cancer Paternal Aunt 78       breast   Breast cancer Paternal Aunt 20   Breast cancer Maternal Grandmother 80   the patient's father died at the age of 32 with gastric cancer. The patient's mother was diagnosed with chronic leukemia at age 67 and died from complications of dementia at age 37 the patient has one brother, diagnosed with breast cancer at the age of 75. She has 2 sisters. There is no other history of breast or ovarian cancer in the family.   GYNECOLOGIC HISTORY:  No LMP recorded. Patient has had a hysterectomy. Menarche age 7, first live birth age 59. The patient is GX P2. She underwent total abdominal hysterectomy with bilateral salpingo-oophorectomy 1972. She used hormone replacement for approximately 38 years, until 2011.   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 Her husband  Jori Moll is currently not active in the business. Daughter [name], lives in [place], is Pres. The other daughter, [name], lives in [place] where she works as a Landscape architect. The patient has 3 grandchildren. She attends a local [employer] DIRECTIVES: The patient has named both her husband Jori Moll and her daughter Suanne Marker as Equities trader powers of attorney.   HEALTH MAINTENANCE: Social History   Tobacco Use   Smoking status: Never Smoker   Smokeless tobacco: Never Used   Tobacco comment: non smoker  Substance Use Topics   Alcohol use: No    Alcohol/week: 0.0 standard drinks   Drug use: No     Colonoscopy:  PAP:  Bone density:  Lipid panel:  Allergies  Allergen Reactions   Amoxicillin Itching    REACTION: reacts opposite effect made infection worse.   Amoxicillin-Pot Clavulanate     REACTION:  Felt like body turned inside out   Esomeprazole Magnesium     REACTION: Nausea   Gabapentin    Omeprazole Nausea And Vomiting    REACTION: not effective   Ranitidine Hcl     REACTION: not effective    Current Outpatient Medications  Medication Sig Dispense Refill   albuterol (VENTOLIN HFA) 108 (90 Base) MCG/ACT inhaler Inhale 2 puffs into the lungs every 4 (four) hours as needed for wheezing or shortness of breath (cough, shortness of breath or wheezing.). 18 g 0   amitriptyline (ELAVIL) 10 MG tablet Take 2 tablets (20 mg total) by mouth at bedtime. 60 tablet 11   amLODipine (NORVASC) 5 MG tablet TAKE 1 TABLET BY MOUTH DAILY. 90 tablet 2   Ascorbic Acid (VITAMIN C) 100 MG tablet Take 100 mg by mouth daily.     calcium carbonate 200 MG capsule Take 250 mg by mouth 2 (two) times daily.     Cholecalciferol (VITAMIN D PO) Take 1 tablet by mouth 2 (two) times daily. Pt takes 2000 units daily     fluticasone (FLONASE) 50 MCG/ACT nasal spray USE 1 SPRAY IN EACH NOSTRIL DAILY AS DIRECTED 16 g 11   Rivaroxaban 15 & 20 MG TBPK Follow package directions: Take one 23m tablet by mouth twice a day. On day 22, switch to one 235mtablet once a day. Take with food. 51 each 0   Spacer/Aero-Holding Chambers (AEROCHAMBER MINI CHAMBER) DEVI For use with albuterol inhaler 1 each 0   SUMAtriptan (IMITREX) 100 MG tablet TAKE 1 TABLET BY MOUTH AS NEEDED FOR MIGRAINE, MAY REPEAT IN 2 HOURS IF NEEDED. MAX 2 TABS IN 24 HOURS. 9 tablet 3   tamoxifen (NOLVADEX) 20 MG tablet TAKE ONE TABLET BY MOUTH ONE TIME DAILY 90 tablet 0   No current facility-administered medications for this visit.    OBJECTIVE: Older white woman who appears younger than stated age  Vi25  05/25/19 0946  BP: (!) 154/61  Pulse: 86  Resp: 18  Temp: 98.2 F (36.8 C)  SpO2: 100%   Wt Readings from Last 3 Encounters:  05/25/19 151 lb 8 oz (68.7 kg)  05/07/19 150 lb 8 oz (68.3 kg)  05/03/19 149 lb 4 oz (67.7 kg)   Body  mass index is 25.21 kg/m.    ECOG FS:1 - Symptomatic but completely ambulatory  Sclerae unicteric, EOMs intact Wearing a mask No cervical or supraclavicular adenopathy Lungs no rales or rhonchi Heart regular rate and rhythm Abd soft, nontender, positive bowel sounds, no masses palpated MSK no focal spinal  tenderness, no upper extremity lymphedema Neuro: nonfocal, well oriented, appropriate affect Breasts: The right breast is status post lumpectomy and radiation with no evidence of local recurrence.  The left breast is benign.  Both axillae are benign.   LAB RESULTS:  CMP     Component Value Date/Time   NA 138 05/03/2019 1039   NA 141 02/03/2017 0932   K 3.8 05/03/2019 1039   K 4.8 02/03/2017 0932   CL 101 05/03/2019 1039   CO2 30 05/03/2019 1039   CO2 27 02/03/2017 0932   GLUCOSE 88 05/03/2019 1039   GLUCOSE 89 02/03/2017 0932   BUN 21 05/03/2019 1039   BUN 17.7 02/03/2017 0932   CREATININE 1.30 (H) 05/03/2019 1039   CREATININE 1.3 (H) 02/03/2017 0932   CALCIUM 9.4 05/03/2019 1039   CALCIUM 9.4 02/03/2017 0932   PROT 6.5 01/10/2019 1058   PROT 6.5 02/03/2017 0932   ALBUMIN 3.5 01/10/2019 1058   ALBUMIN 3.4 (L) 02/03/2017 0932   AST 18 01/10/2019 1058   AST 19 02/03/2017 0932   ALT 10 01/10/2019 1058   ALT 12 02/03/2017 0932   ALKPHOS 45 01/10/2019 1058   ALKPHOS 42 02/03/2017 0932   BILITOT 0.3 01/10/2019 1058   BILITOT 0.37 02/03/2017 0932   GFRNONAA 44 (L) 01/10/2019 1058   GFRAA 51 (L) 01/10/2019 1058    INo results found for: SPEP, UPEP  Lab Results  Component Value Date   WBC 11.1 (H) 05/03/2019   NEUTROABS 7.4 05/03/2019   HGB 14.0 05/03/2019   HCT 42.7 05/03/2019   MCV 90.3 05/03/2019   PLT 165.0 05/03/2019      Chemistry      Component Value Date/Time   NA 138 05/03/2019 1039   NA 141 02/03/2017 0932   K 3.8 05/03/2019 1039   K 4.8 02/03/2017 0932   CL 101 05/03/2019 1039   CO2 30 05/03/2019 1039   CO2 27 02/03/2017 0932   BUN 21  05/03/2019 1039   BUN 17.7 02/03/2017 0932   CREATININE 1.30 (H) 05/03/2019 1039   CREATININE 1.3 (H) 02/03/2017 0932      Component Value Date/Time   CALCIUM 9.4 05/03/2019 1039   CALCIUM 9.4 02/03/2017 0932   ALKPHOS 45 01/10/2019 1058   ALKPHOS 42 02/03/2017 0932   AST 18 01/10/2019 1058   AST 19 02/03/2017 0932   ALT 10 01/10/2019 1058   ALT 12 02/03/2017 0932   BILITOT 0.3 01/10/2019 1058   BILITOT 0.37 02/03/2017 0932      No results found for: LABCA2  No components found for: LABCA125  No results for input(s): INR in the last 168 hours.  Urinalysis    Component Value Date/Time   COLORURINE YELLOW 01/04/2017 [place] 01/04/2017 1755   LABSPEC <1.005 (L) 01/04/2017 1755   PHURINE 6.0 01/04/2017 1755   GLUCOSEU NEGATIVE 01/04/2017 1755   HGBUR NEGATIVE 01/04/2017 1755   BILIRUBINUR NEGATIVE 01/04/2017 1755   KETONESUR NEGATIVE 01/04/2017 1755   PROTEINUR NEGATIVE 01/04/2017 1755   NITRITE NEGATIVE 01/04/2017 1755   LEUKOCYTESUR TRACE (A) 01/04/2017 1755    STUDIES: CT ANGIO CHEST PE W OR WO CONTRAST  Result Date: 05/07/2019 CLINICAL DATA:  Shortness of breath, orthopnea. EXAM: CT ANGIOGRAPHY CHEST WITH CONTRAST TECHNIQUE: Multidetector CT imaging of the chest was performed using the standard protocol during bolus administration of intravenous contrast. Multiplanar CT image reconstructions and MIPs were obtained to evaluate the vascular anatomy. CONTRAST:  58m OMNIPAQUE IOHEXOL 350 MG/ML SOLN  COMPARISON:  None. FINDINGS: Cardiovascular: There are filling defects in the pulmonary arteries bilaterally, which appear mostly peripheral and segmental/subsegmental in location. Heart size normal. No pericardial effusion. Mediastinum/Nodes: No pathologically enlarged mediastinal, hilar or axillary lymph nodes. Esophagus is grossly unremarkable. Lungs/Pleura: Probable subpleural radiation fibrosis in the anterior right lung in this patient with a history of right  breast lumpectomy. Lungs are otherwise clear. Trace right pleural fluid. Airway is unremarkable. Upper Abdomen: Low-attenuation lesion in the dome of the liver measures 1.7 cm and is likely a cyst. Visualized portions of the liver, adrenal glands and right kidney are otherwise unremarkable. 1.4 cm low-density lesion off the upper pole left kidney is likely a cyst. Visualized portions of the spleen, pancreas, stomach and bowel are otherwise grossly unremarkable. No upper abdominal adenopathy. Musculoskeletal: No worrisome lytic or sclerotic lesions. Review of the MIP images confirms the above findings. IMPRESSION: 1. Positive for subacute bilateral pulmonary emboli, mainly peripheral and segmental/subsegmental in location. No evidence right heart strain. Critical Value/emergent results were called by telephone at the time of interpretation on 05/07/2019 at 10:46 am to provider [facility] , who verbally acknowledged these results. 2. Trace right pleural fluid. Electronically Signed   By: Lorin Picket M.D.   On: 05/07/2019 10:48   VAS [place] LOWER EXTREMITY VENOUS (DVT)  Result Date: 05/08/2019  Lower Venous DVTStudy Indications: Pulmonary embolism. Patient reports she had shortness of breath and chest pain since before Christmas, and a CT angio of the chest on 05/06/18 confirmed bilateral pulmonary emboli. Patient is currently being treated with Xarelto, evaluate for source of PE.  Risk Factors: Confirmed PE Cancer Hx breast cancer 2016. Performing Technologist: Mariane Masters RVT  Examination Guidelines: A complete evaluation includes B-mode imaging, spectral Doppler, color Doppler, and power Doppler as needed of all accessible portions of each vessel. Bilateral testing is considered an integral part of a complete examination. Limited examinations for reoccurring indications may be performed as noted. The reflux portion of the exam is performed with the patient in reverse Trendelenburg.   +---------+---------------+---------+-----------+----------+--------------+  RIGHT     Compressibility Phasicity Spontaneity Properties Thrombus Aging  +---------+---------------+---------+-----------+----------+--------------+  CFV       Full            Yes       Yes                                    +---------+---------------+---------+-----------+----------+--------------+  SFJ       Full            Yes       Yes                                    +---------+---------------+---------+-----------+----------+--------------+  FV Prox   Full            Yes       Yes                                    +---------+---------------+---------+-----------+----------+--------------+  FV Mid    Full            Yes       Yes                                    +---------+---------------+---------+-----------+----------+--------------+  FV Distal Full            Yes       Yes                                    +---------+---------------+---------+-----------+----------+--------------+  PFV       Full                                                             +---------+---------------+---------+-----------+----------+--------------+  POP       Full            Yes       Yes                                    +---------+---------------+---------+-----------+----------+--------------+  PTV       Full            Yes       Yes                                    +---------+---------------+---------+-----------+----------+--------------+  PERO      Full            Yes       Yes                                    +---------+---------------+---------+-----------+----------+--------------+  Gastroc   Full                                                             +---------+---------------+---------+-----------+----------+--------------+  GSV       Full            Yes       Yes                                    +---------+---------------+---------+-----------+----------+--------------+    +---------+---------------+---------+-----------+------------+-----------------+  LEFT      Compressibility Phasicity Spontaneity Properties   Thrombus Aging     +---------+---------------+---------+-----------+------------+-----------------+  CFV       Full            Yes       Yes                                         +---------+---------------+---------+-----------+------------+-----------------+  SFJ       Full            Yes       Yes                                         +---------+---------------+---------+-----------+------------+-----------------+  FV Prox   Full            Yes       Yes                                         +---------+---------------+---------+-----------+------------+-----------------+  FV Mid    Full            Yes       Yes                                         +---------+---------------+---------+-----------+------------+-----------------+  FV Distal Full            Yes       Yes                                         +---------+---------------+---------+-----------+------------+-----------------+  PFV       Full                                                                  +---------+---------------+---------+-----------+------------+-----------------+  POP       Full            Yes       Yes                                         +---------+---------------+---------+-----------+------------+-----------------+  PTV       None            No        No          brightly     Age Indeterminate                                                   echogenic                       +---------+---------------+---------+-----------+------------+-----------------+  PERO      Full            Yes       Yes                                         +---------+---------------+---------+-----------+------------+-----------------+  Gastroc   Full                                                                  +---------+---------------+---------+-----------+------------+-----------------+  GSV  Full            Yes       Yes                                         +---------+---------------+---------+-----------+------------+-----------------+    Findings reported to Dr. Glori Bickers at 2:45 pm.  Summary: RIGHT: - No evidence of deep vein thrombosis in the lower extremity. No indirect evidence of obstruction proximal to the inguinal ligament.  LEFT: - Findings consistent with age indeterminate deep vein thrombosis involving the left posterior tibial veins. - All other veins visualized appear fully compressible and demonstrate appropriate Doppler characteristics.  *See table(s) above for measurements and observations. Electronically signed by Jenkins Rouge MD on 05/08/2019 at 3:40:37 PM.    Final     ASSESSMENT: 79 y.o. BRCA negative [place] woman status post right breast upper outer quadrant biopsy 05/03/2014 for a clinical T1b N0, stage IA invasive ductal carcinoma, grade 1 or 2, estrogen receptor positive, progesterone receptor negative, with an MIB-1 of 12%, and HER-2/neu equivocal  (1) right lumpectomy and sentinel lymph node sampling 05/30/2014 showed a pT1c pN0, stage IA invasive ductal carcinoma, grade 1, repeat HER-2 again negative.  (2) genetics testing 05/17/2014 showed no deleterious mutations in ATM, BARD1, BRCA1, BRCA2, BRIP1, CDH1, CHEK2, MRE11A, MUTYH, NBN, NF1, PALB2, PTEN, RAD50, RAD51C, RAD51D, and TP53. Cephus [facility] genetics, BreastNext panel)  (3) Oncotype score of 25 predicts a risk of distant recurrence of 16% if the patient's only systemic treatment is tamoxifen for 5 years; it also predicts a benefit from chemotherapy in the 5% range. Given this marginal benefit the patient opted against adjuvant chemotherapy  (4) adjuvant radiation completed 08/12/2014: Right breast 4250 cGy in 17 sessions, right breast boost 1200 cGy in 6 sessions  (5) started tamoxifen 10/10/2014  (a) status post hysterectomy and bilateral salpingo-oophorectomy in 1972  (b) tamoxifen discontinued  February 2021 with development of DVT/PE   (6) significant family breast cancer history, to be treated as if gene positive  (a) intensified screening started October 2020 with breast MRI.  (b) mammogram scheduled for May 2021  (7) bilateral segmental/subsegmental DVTs without heart strain 05/07/2019  (a) rivaroxaban started 05/07/2019  (b) bilateral lower extremity Dopplers 05/08/2019 shows left posterior tibial DVT  (c) hypercoagulable work-up per Dr. Glori Bickers showed negative lupus anticoagulant, normal protein C activity, protein S antigen and Antithrombin, negative prothrombin II mutation, negative factor V Leiden mutation and no anticardiolipin, phosphatidylserine or beta-2 glycoprotein antibodies.  Homocystine was elevated at 14.0  (d) CEA, CA-27 pending  PLAN: Sarah Young had to the best of my ability to tell an unprovoked PE.  There was really no change at all in any of her activities in December 2020 which is when she developed the problem (just before Christmas).  Of course tamoxifen does increase the risk of clotting, but she has been on tamoxifen almost 5 years.  She developed a clot in the first year of tamoxifen I think it would more convinced that this was the cause of her problem.  This is important because she understands if we are dealing with an unprovoked clot then lifelong anticoagulation becomes more of a concern.  We reviewed the results of her hypercoagulable Perlov which was noncontributory.  Incidentally an elevated homocysteine is not a concern and I have requested that it be removed from this panel in the future.  We did review  the fact that she has taken just about 5 years of tamoxifen and she was scheduled to stop in June of this year anyway.  She remains anxious about the possibility of breast cancer recurrence given her family history.  Once the dust settles with the current clotting issue very likely she will start anastrozole and we will do that for an additional 2 years.   Anastrozole compared to placebo shows no increase in risk of clots.  Of course breast cancer itself, if active, can increase the risk of clotting.  The CT angio showed what is almost certainly going to be a cyst in her liver.  She had an ultrasound of the abdomen in 2012 which showed 2 small liver cysts.  The images really do not coincide and I think just to clear the air we are going to obtain a liver MRI next week.  I am also obtaining a CEA and CA 27-29 today.  Otherwise she is tolerating the rivaroxaban well and she already feels better.  She is not quite back to normal but close she says.  I would continue this for 6 months at a minimum at which time we would decide whether to continue lifelong anticoagulation most likely at a lower dose (10 mg daily instead of 20).  She will have her mammography in May and she will see me shortly thereafter  Total encounter time today 45 minutes.*   Sarah Young, Virgie Dad, MD  05/25/19 10:41 AM Medical Oncology and Hematology [facility] [place], [place] [id number] Tel. [phone]    Fax. [phone]   I, Wilburn Mylar, am acting as scribe for Dr. Virgie Dad. Sarah Young.  I, Lurline Del MD, have reviewed the above documentation for accuracy and completeness, and I agree with the above.    *Total Encounter Time as defined by the Centers for Medicare and Medicaid Services includes, in addition to the face-to-face time of a patient visit (documented in the note above) non-face-to-face time: obtaining and reviewing outside history, ordering and reviewing medications, tests or procedures, care coordination (communications with other health care professionals or caregivers) and documentation in the medical record.

## 2019-05-25 ENCOUNTER — Inpatient Hospital Stay: Payer: Medicare Other | Attending: Oncology | Admitting: Oncology

## 2019-05-25 ENCOUNTER — Other Ambulatory Visit: Payer: Self-pay

## 2019-05-25 ENCOUNTER — Inpatient Hospital Stay: Payer: Medicare Other

## 2019-05-25 ENCOUNTER — Ambulatory Visit: Payer: Medicare Other

## 2019-05-25 VITALS — BP 154/61 | HR 86 | Temp 98.2°F | Resp 18 | Ht 65.0 in | Wt 151.5 lb

## 2019-05-25 DIAGNOSIS — I2699 Other pulmonary embolism without acute cor pulmonale: Secondary | ICD-10-CM | POA: Insufficient documentation

## 2019-05-25 DIAGNOSIS — I82402 Acute embolism and thrombosis of unspecified deep veins of left lower extremity: Secondary | ICD-10-CM | POA: Diagnosis not present

## 2019-05-25 DIAGNOSIS — C50411 Malignant neoplasm of upper-outer quadrant of right female breast: Secondary | ICD-10-CM | POA: Insufficient documentation

## 2019-05-25 DIAGNOSIS — Z170 Estrogen receptor positive status [ER+]: Secondary | ICD-10-CM

## 2019-05-25 DIAGNOSIS — I82442 Acute embolism and thrombosis of left tibial vein: Secondary | ICD-10-CM | POA: Diagnosis not present

## 2019-05-25 LAB — COMPREHENSIVE METABOLIC PANEL
ALT: 9 U/L (ref 0–44)
AST: 21 U/L (ref 15–41)
Albumin: 3.5 g/dL (ref 3.5–5.0)
Alkaline Phosphatase: 41 U/L (ref 38–126)
Anion gap: 9 (ref 5–15)
BUN: 18 mg/dL (ref 8–23)
CO2: 25 mmol/L (ref 22–32)
Calcium: 8.8 mg/dL — ABNORMAL LOW (ref 8.9–10.3)
Chloride: 110 mmol/L (ref 98–111)
Creatinine, Ser: 1.18 mg/dL — ABNORMAL HIGH (ref 0.44–1.00)
GFR calc Af Amer: 51 mL/min — ABNORMAL LOW (ref >60)
GFR calc non Af Amer: 44 mL/min — ABNORMAL LOW (ref >60)
Glucose, Bld: 87 mg/dL (ref 70–99)
Potassium: 4.1 mmol/L (ref 3.5–5.1)
Sodium: 144 mmol/L (ref 135–145)
Total Bilirubin: 0.4 mg/dL (ref 0.3–1.2)
Total Protein: 6.5 g/dL (ref 6.5–8.1)

## 2019-05-25 LAB — CBC WITH DIFFERENTIAL/PLATELET
Abs Immature Granulocytes: 0.01 10*3/uL (ref 0.00–0.07)
Basophils Absolute: 0.1 10*3/uL (ref 0.0–0.1)
Basophils Relative: 2 %
Eosinophils Absolute: 0.1 10*3/uL (ref 0.0–0.5)
Eosinophils Relative: 1 %
HCT: 42 % (ref 36.0–46.0)
Hemoglobin: 13.4 g/dL (ref 12.0–15.0)
Immature Granulocytes: 0 %
Lymphocytes Relative: 26 %
Lymphs Abs: 1.5 10*3/uL (ref 0.7–4.0)
MCH: 29.7 pg (ref 26.0–34.0)
MCHC: 31.9 g/dL (ref 30.0–36.0)
MCV: 93.1 fL (ref 80.0–100.0)
Monocytes Absolute: 0.6 10*3/uL (ref 0.1–1.0)
Monocytes Relative: 11 %
Neutro Abs: 3.6 10*3/uL (ref 1.7–7.7)
Neutrophils Relative %: 60 %
Platelets: 272 10*3/uL (ref 150–400)
RBC: 4.51 MIL/uL (ref 3.87–5.11)
RDW: 14.5 % (ref 11.5–15.5)
WBC: 5.9 10*3/uL (ref 4.0–10.5)
nRBC: 0 % (ref 0.0–0.2)

## 2019-05-25 LAB — D-DIMER, QUANTITATIVE: D-Dimer, Quant: 0.27 ug/mL-FEU (ref 0.00–0.50)

## 2019-05-25 LAB — CEA (IN HOUSE-CHCC): CEA (CHCC-In House): <1 ng/mL (ref 0.00–5.00)

## 2019-05-25 NOTE — Addendum Note (Signed)
Addended by: Chauncey Cruel on: 05/25/2019 10:54 AM   Modules accepted: Orders

## 2019-05-26 LAB — CANCER ANTIGEN 27.29: CA 27.29: 10.1 U/mL (ref 0.0–38.6)

## 2019-05-28 ENCOUNTER — Telehealth: Payer: Self-pay | Admitting: Oncology

## 2019-05-28 NOTE — Telephone Encounter (Signed)
I talk with patient regarding schedule  

## 2019-05-30 ENCOUNTER — Ambulatory Visit: Payer: Medicare Other | Attending: Internal Medicine

## 2019-05-30 DIAGNOSIS — Z23 Encounter for immunization: Secondary | ICD-10-CM

## 2019-05-30 NOTE — Progress Notes (Signed)
   Covid-19 Vaccination Clinic  Name:  Sarah Young    MRN: [id number] DOB: 04-Dec-1940  05/30/2019  [name] was observed post Covid-19 immunization for 15 minutes without incident. She was provided with Vaccine Information Sheet and instruction to access the V-Safe system.   [name] was instructed to call 911 with any severe reactions post vaccine: Marland Kitchen Difficulty breathing  . Swelling of face and throat  . A fast heartbeat  . A bad rash all over body  . Dizziness and weakness   Immunizations Administered    Name Date Dose VIS Date Route   Pfizer COVID-19 Vaccine 05/30/2019 11:40 AM 0.3 mL 03/09/2019 Intramuscular   Manufacturer: [place]   Lot: [id number]   [place]: [id number]

## 2019-06-04 ENCOUNTER — Telehealth: Payer: Self-pay | Admitting: Family Medicine

## 2019-06-04 MED ORDER — RIVAROXABAN 20 MG PO TABS: 20.0000 mg | ORAL_TABLET | Freq: Every day | ORAL | 0 refills | Status: DC

## 2019-06-04 NOTE — Telephone Encounter (Signed)
1 month rx sent.  She'll need longer term rx from PCP.  Thanks.  Would continue with 20mg  a day.

## 2019-06-04 NOTE — Telephone Encounter (Signed)
Pt notified Rx sent to pharmacy

## 2019-06-04 NOTE — Telephone Encounter (Signed)
Pt called stating she will run out of her xarelto 20mg  take one tablet daily.  Tomorrow 3/9  She stated she was on 30mg  the first 21 days and for last 7 days taking 20mg  daily  [facility]

## 2019-06-04 NOTE — Telephone Encounter (Signed)
She was given the starter pack of xarelto, will route to PCP and a provider in the office to fill what dose she should be on going forward since it's a new Rx

## 2019-06-13 ENCOUNTER — Other Ambulatory Visit: Payer: Self-pay

## 2019-06-13 ENCOUNTER — Ambulatory Visit (HOSPITAL_COMMUNITY)
Admission: RE | Admit: 2019-06-13 | Discharge: 2019-06-13 | Disposition: A | Discharge status: Home / Self Care | Payer: Medicare Other | Source: Ambulatory Visit | Attending: Oncology | Admitting: Oncology

## 2019-06-13 DIAGNOSIS — I82402 Acute embolism and thrombosis of unspecified deep veins of left lower extremity: Secondary | ICD-10-CM | POA: Diagnosis not present

## 2019-06-13 DIAGNOSIS — Z170 Estrogen receptor positive status [ER+]: Secondary | ICD-10-CM | POA: Insufficient documentation

## 2019-06-13 DIAGNOSIS — C50411 Malignant neoplasm of upper-outer quadrant of right female breast: Secondary | ICD-10-CM | POA: Diagnosis not present

## 2019-06-13 DIAGNOSIS — C50919 Malignant neoplasm of unspecified site of unspecified female breast: Secondary | ICD-10-CM | POA: Diagnosis not present

## 2019-06-13 MED ORDER — GADOBUTROL 1 MMOL/ML IV SOLN
5.0000 mL | Freq: Once | INTRAVENOUS | Status: AC | PRN
  Administered 2019-06-13: 5 mL via INTRAVENOUS

## 2019-06-14 ENCOUNTER — Telehealth: Payer: Self-pay

## 2019-06-14 ENCOUNTER — Other Ambulatory Visit: Payer: Self-pay | Admitting: Oncology

## 2019-06-14 NOTE — Telephone Encounter (Signed)
RN notified patient of MRI results after MD reviewed.  Pt verbalized understanding.

## 2019-07-02 ENCOUNTER — Other Ambulatory Visit: Payer: Self-pay | Admitting: Family Medicine

## 2019-07-04 NOTE — Telephone Encounter (Signed)
See phone note from 06/04/19, Dr. Damita Dunnings refilled 1st Rx, will route to PCP for approval

## 2019-07-11 ENCOUNTER — Ambulatory Visit (INDEPENDENT_AMBULATORY_CARE_PROVIDER_SITE_OTHER): Payer: Medicare Other | Admitting: Family Medicine

## 2019-07-11 ENCOUNTER — Encounter: Payer: Self-pay | Admitting: Family Medicine

## 2019-07-11 ENCOUNTER — Other Ambulatory Visit: Payer: Self-pay

## 2019-07-11 ENCOUNTER — Ambulatory Visit (INDEPENDENT_AMBULATORY_CARE_PROVIDER_SITE_OTHER)
Admission: RE | Admit: 2019-07-11 | Discharge: 2019-07-11 | Disposition: A | Discharge status: Home / Self Care | Payer: Medicare Other | Source: Ambulatory Visit | Attending: Family Medicine | Admitting: Family Medicine

## 2019-07-11 VITALS — BP 146/82 | HR 81 | Temp 97.4°F | Ht 65.0 in | Wt 151.3 lb

## 2019-07-11 DIAGNOSIS — R42 Dizziness and giddiness: Secondary | ICD-10-CM | POA: Diagnosis not present

## 2019-07-11 DIAGNOSIS — Z86711 Personal history of pulmonary embolism: Secondary | ICD-10-CM | POA: Diagnosis not present

## 2019-07-11 DIAGNOSIS — I10 Essential (primary) hypertension: Secondary | ICD-10-CM | POA: Diagnosis not present

## 2019-07-11 DIAGNOSIS — R0602 Shortness of breath: Secondary | ICD-10-CM | POA: Diagnosis not present

## 2019-07-11 DIAGNOSIS — I82402 Acute embolism and thrombosis of unspecified deep veins of left lower extremity: Secondary | ICD-10-CM | POA: Diagnosis not present

## 2019-07-11 LAB — COMPREHENSIVE METABOLIC PANEL
ALT: 17 U/L (ref 0–35)
AST: 24 U/L (ref 0–37)
Albumin: 4 g/dL (ref 3.5–5.2)
Alkaline Phosphatase: 46 U/L (ref 39–117)
BUN: 22 mg/dL (ref 6–23)
CO2: 24 mEq/L (ref 19–32)
Calcium: 9.8 mg/dL (ref 8.4–10.5)
Chloride: 103 mEq/L (ref 96–112)
Creatinine, Ser: 1.27 mg/dL — ABNORMAL HIGH (ref 0.40–1.20)
GFR: 40.57 mL/min — ABNORMAL LOW (ref >60.00)
Glucose, Bld: 88 mg/dL (ref 70–99)
Potassium: 4.1 mEq/L (ref 3.5–5.1)
Sodium: 137 mEq/L (ref 135–145)
Total Bilirubin: 0.4 mg/dL (ref 0.2–1.2)
Total Protein: 6.7 g/dL (ref 6.0–8.3)

## 2019-07-11 LAB — CBC WITH DIFFERENTIAL/PLATELET
Basophils Absolute: 0.1 10*3/uL (ref 0.0–0.1)
Basophils Relative: 1.1 % (ref 0.0–3.0)
Eosinophils Absolute: 0.1 10*3/uL (ref 0.0–0.7)
Eosinophils Relative: 0.9 % (ref 0.0–5.0)
HCT: 41.4 % (ref 36.0–46.0)
Hemoglobin: 13.6 g/dL (ref 12.0–15.0)
Lymphocytes Relative: 26.4 % (ref 12.0–46.0)
Lymphs Abs: 1.8 10*3/uL (ref 0.7–4.0)
MCHC: 32.7 g/dL (ref 30.0–36.0)
MCV: 91.5 fl (ref 78.0–100.0)
Monocytes Absolute: 0.5 10*3/uL (ref 0.1–1.0)
Monocytes Relative: 7.3 % (ref 3.0–12.0)
Neutro Abs: 4.4 10*3/uL (ref 1.4–7.7)
Neutrophils Relative %: 64.3 % (ref 43.0–77.0)
Platelets: 216 10*3/uL (ref 150.0–400.0)
RBC: 4.53 Mil/uL (ref 3.87–5.11)
RDW: 14.6 % (ref 11.5–15.5)
WBC: 6.9 10*3/uL (ref 4.0–10.5)

## 2019-07-11 NOTE — Assessment & Plan Note (Addendum)
Labile bp  Was low and then high  Neg orthostatics today  BP: (!) 146/82    Taking amlodipine Also anxious

## 2019-07-11 NOTE — Assessment & Plan Note (Signed)
On xarelto Seeing heme onc (also for cancer f/u)  More sob lately/ labile bp and dizzy   Nl pulse ox  Not sob in office   cxr ordered

## 2019-07-11 NOTE — Patient Instructions (Signed)
Lab and chest xray now   If symptoms suddenly worsen in the meantime please go to the hospital

## 2019-07-11 NOTE — Assessment & Plan Note (Signed)
Since the weekend  Also intermittent dizziness and fatigue  Taking xarelto for PE diagnosed in feb with DVT Reassuring exam  cxr and labs today   May have to repeat CTA

## 2019-07-11 NOTE — Assessment & Plan Note (Signed)
With some malaise and sob on exertion  Is positional at times  Neg orthostatics today  Labile bp at home   H/o recent PE  Lab/cxr now

## 2019-07-11 NOTE — Assessment & Plan Note (Signed)
Unprovoked  On xarelto  Symptoms resolved

## 2019-07-11 NOTE — Progress Notes (Signed)
Subjective:    Patient ID: Sarah Young, female    DOB: 12-Oct-1940, 79 y.o.   MRN: [id number]  This visit occurred during the SARS-CoV-2 public health emergency.  Safety protocols were in place, including screening questions prior to the visit, additional usage of staff PPE, and extensive cleaning of exam room while observing appropriate contact time as indicated for disinfecting solutions.    HPI Pt with h/o PE presents with sob/ dizziness and elevated bp   Wt Readings from Last 3 Encounters:  07/11/19 151 lb 5 oz (68.6 kg)  05/25/19 151 lb 8 oz (68.7 kg)  05/07/19 150 lb 8 oz (68.3 kg)   25.18 kg/m    BP Readings from Last 3 Encounters:  07/11/19 (!) 146/82  05/25/19 (!) 154/61  05/07/19 134/70   Pulse Readings from Last 3 Encounters:  07/11/19 81  05/25/19 86  05/07/19 90   Taking xarelto for PE /DVT diagnosed in February Pulse ox 98% now on RA  Was feeling ok up until last Sunday/ still getting her strength back but making progress   Did not sleep well and got up Sunday am feeling bad  Her bp was low 102/50  Unusual for her  Laid low on Sunday  Felt a little better on Monday- but noted fatigue after exertion along with a shaky feeling after exertion also  Then next day she became dizzy when she stood up quickly in the yard  Had to go in and sit down   bp was 148/?   This am felt some better  Walked to the mailbox a little after 10  Went back into the house and felt dizzy again   bp was high 170s/81 Lay down and called husband  Then bp went down to 152/71   A little sob / still now  Feels like she needs to take a deep breath  No chest pain  No cough at all   Has had covid and also both pfizer covid vaccines   No missed doses of xarelto   Orthostatic bp are nl today 148/72 lying  149/70 sitting    Sees heme/onc in may for routine f/u  Has discussed anticoag for unprovoked PE and DVT  Last CT chest report: CT ANGIO CHEST PE W OR WO CONTRAST  (Accession [id number]) (Order [id number]) Imaging Date: 05/07/2019 Department: [facility] Released By: Minus Liberty Authorizing: Jama Krichbaum, Wynelle Fanny, MD  Exam Status  Status  Final [99]  PACS Intelerad Image Link  Show images for CT ANGIO CHEST PE W OR WO CONTRAST  Study Result  CLINICAL DATA:  Shortness of breath, orthopnea.  EXAM: CT ANGIOGRAPHY CHEST WITH CONTRAST  TECHNIQUE: Multidetector CT imaging of the chest was performed using the standard protocol during bolus administration of intravenous contrast. Multiplanar CT image reconstructions and MIPs were obtained to evaluate the vascular anatomy.  CONTRAST:  73mL OMNIPAQUE IOHEXOL 350 MG/ML SOLN  COMPARISON:  None.  FINDINGS: Cardiovascular: There are filling defects in the pulmonary arteries bilaterally, which appear mostly peripheral and segmental/subsegmental in location. Heart size normal. No pericardial effusion.  Mediastinum/Nodes: No pathologically enlarged mediastinal, hilar or axillary lymph nodes. Esophagus is grossly unremarkable.  Lungs/Pleura: Probable subpleural radiation fibrosis in the anterior right lung in this patient with a history of right breast lumpectomy. Lungs are otherwise clear. Trace right pleural fluid. Airway is unremarkable.  Upper Abdomen: Low-attenuation lesion in the dome of the liver measures 1.7 cm and is likely a cyst.  Visualized portions of the liver, adrenal glands and right kidney are otherwise unremarkable. 1.4 cm low-density lesion off the upper pole left kidney is likely a cyst. Visualized portions of the spleen, pancreas, stomach and bowel are otherwise grossly unremarkable. No upper abdominal adenopathy.  Musculoskeletal: No worrisome lytic or sclerotic lesions.  Review of the MIP images confirms the above findings.  IMPRESSION: 1. Positive for subacute bilateral pulmonary emboli, mainly peripheral and  segmental/subsegmental in location. No evidence right heart strain. Critical Value/emergent results were called by telephone at the time of interpretation on 05/07/2019 at 10:46 am to provider [facility] , who verbally acknowledged these results. 2. Trace right pleural fluid.   Electronically Signed   By: Lorin Picket M.D      Patient Active Problem List   Diagnosis Date Noted  . Dizziness 07/11/2019  . 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 Hypercoagulopathy ([place]) 05/22/2019  . History of pulmonary embolism 05/07/2019  . Anterior chest wall pain 04/06/2019  . Chest pain of unknown etiology -low risk for Cardiac Etiology 04/06/2019  . SOB (shortness of breath) on exertion 04/06/2019  . Osteopenia 05/02/2018  . Routine general medical examination at a health care facility 11/21/2016  . Estrogen deficiency 11/19/2016  . Renal insufficiency 11/19/2016  . Pedal edema 12/15/2015  . Family history of breast cancer in female 05/17/2014  . Malignant neoplasm of upper-outer quadrant of right breast in female, estrogen receptor positive ([place]) 05/08/2014  . GERD (gastroesophageal reflux disease) 03/01/2014  . Lumbar degenerative disc disease 08/15/2013  . Chronic pain of scapula 08/15/2013  . Essential hypertension 10/02/2008  . Migraine 09/06/2008  . Vitamin D deficiency 06/27/2007  . HYPERLIPIDEMIA 05/11/2007  . Generalized anxiety disorder 05/11/2007  . Disorder of bone and cartilage 05/11/2007  . H/O poliomyelitis 05/08/2007  . OSTEOARTHRITIS, HANDS, BILATERAL 05/08/2007  . Fibromyalgia 05/08/2007  . URINARY INCONTINENCE, STRESS, MILD 05/08/2007   Past Medical History:  Diagnosis Date  . Anxiety   . Arthritis    OA of hands  . Breast cancer of upper-outer quadrant of right female breast ([place]) 05/08/2014  . DVT (deep venous thrombosis) ([place]) 1980s  . Fibromyalgia   . Hyperlipidemia   . Irritable bowel syndrome   . Migraine   . Osteopenia     . S/P radiation therapy 06/2014   Right breast 4250 cGy in 17 sessions, right breast boost 1200 cGy in 6 sessions= 07/11/2014 through 08/12/2014   . Vitamin D deficiency   . Wears glasses    Past Surgical History:  Procedure Laterality Date  . ABDOMINAL HYSTERECTOMY  1972  . APPENDECTOMY    . BREAST EXCISIONAL BIOPSY Left   . BREAST LUMPECTOMY Right 2016  . BREAST SURGERY  1995   lumpectomy fibrocystic breast-lt  . CATARACT EXTRACTION     both  . CHOLECYSTECTOMY  2005  . COLONOSCOPY    . ovarian cyst removed  1985  . RADIOACTIVE SEED GUIDED PARTIAL MASTECTOMY WITH AXILLARY SENTINEL LYMPH NODE BIOPSY Right 05/30/2014   Procedure: RADIOACTIVE SEED GUIDED PARTIAL MASTECTOMY WITH AXILLARY SENTINEL LYMPH NODE BIOPSY;  Surgeon: Autumn Messing III, MD;  Location: [facility];  Service: General;  Laterality: Right;   Social History   Tobacco Use  . Smoking status: Never Smoker  . Smokeless tobacco: Never Used  . Tobacco comment: non smoker  Substance Use Topics  . Alcohol use: No    Alcohol/week: 0.0 standard drinks  . Drug use: No  Family History  Problem Relation Age of Onset  . Hypertension Father   . Cancer Father 35       esophageal - smoker  . Leukemia Mother   . Cancer Mother 37       leukemia  . Cancer Brother 81       breast CA  . Breast cancer Brother 13  . Cancer Maternal Aunt 80       leukemia  . Cancer Paternal Aunt 4       breast  . Breast cancer Paternal Aunt 65  . Breast cancer Maternal Grandmother 80   Allergies  Allergen Reactions  . Amoxicillin Itching    REACTION: reacts opposite effect made infection worse.  Marland Kitchen Amoxicillin-Pot Clavulanate     REACTION: Felt like body turned inside out  . Esomeprazole Magnesium     REACTION: Nausea  . Gabapentin   . Omeprazole Nausea And Vomiting    REACTION: not effective  . Ranitidine Hcl     REACTION: not effective   Current Outpatient Medications on File Prior to Visit   Medication Sig Dispense Refill  . amLODipine (NORVASC) 5 MG tablet TAKE 1 TABLET BY MOUTH DAILY. 90 tablet 2  . Ascorbic Acid (VITAMIN C) 100 MG tablet Take 100 mg by mouth daily.    . calcium carbonate 200 MG capsule Take 250 mg by mouth 2 (two) times daily.    . Cholecalciferol (VITAMIN D PO) Take 1 tablet by mouth 2 (two) times daily. Pt takes 2000 units daily    . Spacer/Aero-Holding Chambers (AEROCHAMBER MINI CHAMBER) DEVI For use with albuterol inhaler 1 each 0  . SUMAtriptan (IMITREX) 100 MG tablet TAKE 1 TABLET BY MOUTH AS NEEDED FOR MIGRAINE, MAY REPEAT IN 2 HOURS IF NEEDED. MAX 2 TABS IN 24 HOURS. 9 tablet 3  . XARELTO 20 MG TABS tablet TAKE 1 TABLET BY MOUTH DAILY WITH SUPPER. 30 tablet 5   No current facility-administered medications on file prior to visit.     Review of Systems  Constitutional: Positive for activity change and fatigue. Negative for appetite change, fever and unexpected weight change.  HENT: Negative for congestion, ear pain, rhinorrhea, sinus pressure and sore throat.   Eyes: Negative for pain, redness and visual disturbance.  Respiratory: Positive for shortness of breath. Negative for cough and wheezing.   Cardiovascular: Negative for chest pain, palpitations and leg swelling.  Gastrointestinal: Negative for abdominal pain, blood in stool, constipation and diarrhea.  Endocrine: Negative for polydipsia and polyuria.  Genitourinary: Negative for dysuria, frequency and urgency.  Musculoskeletal: Negative for arthralgias, back pain, gait problem and myalgias.  Skin: Negative for pallor and rash.  Allergic/Immunologic: Negative for environmental allergies.  Neurological: Negative for dizziness, syncope and headaches.       Generalized weakness  Hematological: Negative for adenopathy. Does not bruise/bleed easily.  Psychiatric/Behavioral: Negative for decreased concentration and dysphoric mood. The patient is nervous/anxious.        Objective:   Physical  Exam Constitutional:      General: She is not in acute distress.    Appearance: She is well-developed and normal weight. She is not ill-appearing or diaphoretic.     Comments: Seems fatigued   HENT:     Head: Normocephalic and atraumatic.  Eyes:     Conjunctiva/sclera: Conjunctivae normal.     Pupils: Pupils are equal, round, and reactive to light.  Neck:     Thyroid: No thyromegaly.     Vascular: No carotid bruit or  JVD.  Cardiovascular:     Rate and Rhythm: Normal rate and regular rhythm.     Heart sounds: Normal heart sounds. No gallop.   Pulmonary:     Effort: Pulmonary effort is normal. No respiratory distress.     Breath sounds: Normal breath sounds. No stridor. No wheezing, rhonchi or rales.  Chest:     Chest wall: No tenderness.  Abdominal:     General: Bowel sounds are normal. There is no distension or abdominal bruit.     Palpations: Abdomen is soft. There is no mass.     Tenderness: There is no abdominal tenderness.  Musculoskeletal:     Cervical back: Normal range of motion and neck supple.  Lymphadenopathy:     Cervical: No cervical adenopathy.  Skin:    General: Skin is warm and dry.     Coloration: Skin is not pale.     Findings: No erythema or rash.  Neurological:     Mental Status: She is alert.     Cranial Nerves: No cranial nerve deficit.     Sensory: No sensory deficit.     Coordination: Coordination normal.     Deep Tendon Reflexes: Reflexes are normal and symmetric.  Psychiatric:        Mood and Affect: Mood is anxious.     Comments: Mildly anxious Fatigued            Assessment & Plan:   Problem List Items Addressed This Visit      Cardiovascular and Mediastinum   Essential hypertension (Chronic)    Labile bp  Was low and then high  Neg orthostatics today  BP: (!) 146/82    Taking amlodipine Also anxious      Relevant Orders   CBC with Differential/Platelet   Comprehensive metabolic panel   Deep vein thrombosis (DVT) of left  lower extremity ([facility])    Unprovoked  On xarelto  Symptoms resolved         Other   SOB (shortness of breath) on exertion - Primary    Since the weekend  Also intermittent dizziness and fatigue  Taking xarelto for PE diagnosed in feb with DVT Reassuring exam  cxr and labs today   May have to repeat CTA      Relevant Orders   DG Chest 2 View (Completed)   CBC with Differential/Platelet   Comprehensive metabolic panel   History of pulmonary embolism    On xarelto Seeing heme onc (also for cancer f/u)  More sob lately/ labile bp and dizzy   Nl pulse ox  Not sob in office   cxr ordered       Dizziness    With some malaise and sob on exertion  Is positional at times  Neg orthostatics today  Labile bp at home   H/o recent PE  Lab/cxr now

## 2019-07-12 ENCOUNTER — Telehealth: Payer: Self-pay | Admitting: Family Medicine

## 2019-07-12 ENCOUNTER — Ambulatory Visit (INDEPENDENT_AMBULATORY_CARE_PROVIDER_SITE_OTHER)
Admission: RE | Admit: 2019-07-12 | Discharge: 2019-07-12 | Disposition: A | Discharge status: Home / Self Care | Payer: Medicare Other | Source: Ambulatory Visit | Attending: Family Medicine | Admitting: Family Medicine

## 2019-07-12 DIAGNOSIS — Z86711 Personal history of pulmonary embolism: Secondary | ICD-10-CM

## 2019-07-12 DIAGNOSIS — R0602 Shortness of breath: Secondary | ICD-10-CM

## 2019-07-12 MED ORDER — IOHEXOL 350 MG/ML SOLN
80.0000 mL | Freq: Once | INTRAVENOUS | Status: AC | PRN
  Administered 2019-07-12: 60 mL via INTRAVENOUS

## 2019-07-12 NOTE — Telephone Encounter (Signed)
-----   Message from Tammi Sou, [place] sent at 07/12/2019  4:20 PM EDT ----- Pt notified of CT results and Dr. Marliss Coots comments. Pt said she just got an Echo done in Jan but she said if that's what Dr. Glori Bickers thinks she needs then she will do it because she is still SOB. ER precautions given but pt wanted me to ask Dr. Glori Bickers if there is anything else she can recommend to help with her SOB

## 2019-07-12 NOTE — Telephone Encounter (Signed)
Order is done  Will send to [facility]

## 2019-07-12 NOTE — Telephone Encounter (Signed)
-----   Message from Sarah Young, [place] sent at 07/12/2019  9:19 AM EDT ----- Pt notified of lab results and Dr. Marliss Coots comments. Pt is still SOB, she took a shower this morning and just from taking a shower I could hear the SOB through the phone. Pt advised Dr. Glori Bickers will put order in and our [facility] will call to schedule CT

## 2019-07-12 NOTE — Telephone Encounter (Signed)
CTA scheduled at [facility] for today at 3pm , patient notified

## 2019-07-23 ENCOUNTER — Telehealth: Payer: Self-pay | Admitting: Family Medicine

## 2019-07-23 NOTE — Telephone Encounter (Signed)
I left a message for patient to call back and schedule a physical . Patient has her awv scheduled on 07/27/19 and she needs to follow up with Dr.Tower for a physical and lab work.

## 2019-07-27 ENCOUNTER — Other Ambulatory Visit: Payer: Self-pay

## 2019-07-27 ENCOUNTER — Ambulatory Visit (INDEPENDENT_AMBULATORY_CARE_PROVIDER_SITE_OTHER): Payer: Medicare Other

## 2019-07-27 VITALS — BP 119/63 | Ht 65.0 in | Wt 151.3 lb

## 2019-07-27 DIAGNOSIS — Z0000 Encounter for general adult medical examination without abnormal findings: Secondary | ICD-10-CM | POA: Diagnosis not present

## 2019-07-27 NOTE — Progress Notes (Signed)
Subjective:   Sarah Young is a 79 y.o. female who presents for an Initial Medicare Annual Wellness Visit.  Review of Systems: N/A      This visit is being conducted through telemedicine via telephone at the nurse health advisor's home address due to the COVID-19 pandemic. This patient has given me verbal consent via doximity to conduct this visit, patient states they are participating from their home address. Patient and myself are on the telephone call. There is no referral for this visit. Some vital signs may be absent or patient reported.    Patient identification: identified by name, DOB, and current address    Cardiac Risk Factors include: advanced age (>67men, >33 women);hypertension;dyslipidemia     Objective:    Today's Vitals   07/27/19 1013  BP: 119/63  Weight: 151 lb 5 oz (68.6 kg)  Height: 5\' 5"  (1.651 m)   Body mass index is 25.18 kg/m.  Advanced Directives 07/27/2019 01/04/2017 11/19/2016 02/05/2016 01/08/2015 09/18/2014 08/27/2014  Does Patient Have a Medical Advance Directive? Yes Yes Yes Yes Yes Yes Yes  Type of Paramedic of [place];Living will [place];Living will [place];Living will - - [place];Living will [place]  Does patient want to make changes to medical advance directive? - - No - Patient declined - - - -  Copy of [place] in Chart? No - copy requested No - copy requested No - copy requested - - No - copy requested -  Would patient like information on creating a medical advance directive? - No - Patient declined - - - - -    Current Medications (verified) Outpatient Encounter Medications as of 07/27/2019  Medication Sig  . amLODipine (NORVASC) 5 MG tablet TAKE 1 TABLET BY MOUTH DAILY.  Marland Kitchen Ascorbic Acid (VITAMIN C) 100 MG tablet Take 100 mg by mouth daily.  . calcium carbonate 200 MG capsule Take 250 mg by mouth 2 (two) times  daily.  . Cholecalciferol (VITAMIN D PO) Take 1 tablet by mouth 2 (two) times daily. Pt takes 2000 units daily  . Spacer/Aero-Holding Chambers (AEROCHAMBER MINI CHAMBER) DEVI For use with albuterol inhaler  . SUMAtriptan (IMITREX) 100 MG tablet TAKE 1 TABLET BY MOUTH AS NEEDED FOR MIGRAINE, MAY REPEAT IN 2 HOURS IF NEEDED. MAX 2 TABS IN 24 HOURS.  Marland Kitchen XARELTO 20 MG TABS tablet TAKE 1 TABLET BY MOUTH DAILY WITH SUPPER.   No facility-administered encounter medications on file as of 07/27/2019.    Allergies (verified) Amoxicillin, Amoxicillin-pot clavulanate, Esomeprazole magnesium, Gabapentin, Omeprazole, and Ranitidine hcl   History: Past Medical History:  Diagnosis Date  . Anxiety   . Arthritis    OA of hands  . Breast cancer of upper-outer quadrant of right female breast ([facility]) 05/08/2014  . DVT (deep venous thrombosis) ([facility]) 1980s  . Fibromyalgia   . Hyperlipidemia   . Irritable bowel syndrome   . Migraine   . Osteopenia   . S/P radiation therapy 06/2014   Right breast 4250 cGy in 17 sessions, right breast boost 1200 cGy in 6 sessions= 07/11/2014 through 08/12/2014   . Vitamin D deficiency   . Wears glasses    Past Surgical History:  Procedure Laterality Date  . ABDOMINAL HYSTERECTOMY  1972  . APPENDECTOMY    . BREAST EXCISIONAL BIOPSY Left   . BREAST LUMPECTOMY Right 2016  . BREAST SURGERY  1995   lumpectomy fibrocystic breast-lt  . CATARACT  EXTRACTION     both  . CHOLECYSTECTOMY  2005  . COLONOSCOPY    . ovarian cyst removed  1985  . RADIOACTIVE SEED GUIDED PARTIAL MASTECTOMY WITH AXILLARY SENTINEL LYMPH NODE BIOPSY Right 05/30/2014   Procedure: RADIOACTIVE SEED GUIDED PARTIAL MASTECTOMY WITH AXILLARY SENTINEL LYMPH NODE BIOPSY;  Surgeon: Autumn Messing III, MD;  Location: [facility];  Service: General;  Laterality: Right;   Family History  Problem Relation Age of Onset  . Hypertension Father   . Cancer Father 43       esophageal -  smoker  . Leukemia Mother   . Cancer Mother 1       leukemia  . Cancer Brother 79       breast CA  . Breast cancer Brother 46  . Cancer Maternal Aunt 80       leukemia  . Cancer Paternal Aunt 43       breast  . Breast cancer Paternal Aunt 78  . Breast cancer Maternal Grandmother 80   Social History   Socioeconomic History  . Marital status: Married    Spouse name: Not on file  . Number of children: Not on file  . Years of education: Not on file  . Highest education level: Not on file  Occupational History  . 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 Smoking status: Never Smoker  . Smokeless tobacco: Never Used  . Tobacco comment: non smoker  Substance and Sexual Activity  . Alcohol use: No    Alcohol/week: 0.0 standard drinks  . Drug use: No  . Sexual activity: Not Currently  Other Topics Concern  . Not on file  Social History Narrative   Usually very active.      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   Social Determinants of Health   Financial Resource Strain: Low Risk   . Difficulty of Paying Living Expenses: Not hard at all  Food Insecurity: No Food Insecurity  . Worried About Charity fundraiser in the Last Year: Never true  . Ran Out of Food in the Last Year: Never true  Transportation Needs: No Transportation Needs  . Lack of Transportation (Medical): No  . Lack of Transportation (Non-Medical): No  Physical Activity: Inactive  . Days of Exercise per Week: 0 days  . Minutes of Exercise per Session: 0 min  Stress: No Stress Concern Present  . Feeling of Stress : Not at all  Social Connections:   . Frequency of Communication with Friends and Family:   . Frequency of Social Gatherings with Friends and Family:   . Attends Religious Services:   . Active Member of Clubs or Organizations:   . Attends Archivist Meetings:   Marland Kitchen Marital Status:     Tobacco Counseling Counseling given: Not  Answered Comment: non smoker   Clinical Intake:  Pre-visit preparation completed: Yes  Pain : No/denies pain     Nutritional Risks: None Diabetes: No  How often do you need to have someone help you when you read instructions, pamphlets, or other written materials from your doctor or pharmacy?: 1 - Never What is the last grade level you completed in school?: some college  Interpreter Needed?: No  Information entered by :: CJohnson, LPN   Activities of Daily Living In your present state of health, do you have any difficulty performing the following activities: 07/27/2019  Hearing? N  Vision? N  Difficulty  concentrating or making decisions? N  Walking or climbing stairs? N  Dressing or bathing? N  Doing errands, shopping? N  Preparing Food and eating ? N  Using the Toilet? N  In the past six months, have you accidently leaked urine? Y  Comment wears panty liners  Do you have problems with loss of bowel control? N  Managing your Medications? N  Managing your Finances? N  Housekeeping or managing your Housekeeping? N  Some recent data might be hidden     Immunizations and Health Maintenance Immunization History  Administered Date(s) Administered  . Influenza Whole 12/02/2009  . Influenza, High Dose Seasonal PF 02/06/2018, 01/05/2019  . Influenza,inj,Quad PF,6+ Mos 04/20/2013, 03/04/2015, 12/15/2015  . Influenza-Unspecified 01/24/2017  . PFIZER SARS-COV-2 Vaccination 05/05/2019, 05/30/2019  . Pneumococcal Conjugate-13 03/04/2015  . Pneumococcal Polysaccharide-23 05/11/2007  . Td 10/23/2003   There are no preventive care reminders to display for this patient.  Patient Care Team: Tower, Wynelle Fanny, MD as PCP - [place], [place], MD as Consulting Physician (General Surgery) Magrinat, Virgie Dad, MD as Consulting Physician (Oncology) Arloa Koh, MD (Inactive) as Consulting Physician (Radiation Oncology) Rockwell [place], RN as Registered Nurse Mauro Kaufmann,  RN as Registered Nurse Holley Bouche, NP (Inactive) as Nurse Practitioner (Nurse Practitioner) Sylvan Cheese, NP as Nurse Practitioner (Nurse Practitioner)  Indicate any recent Medical Services you may have received from other than [facility] providers in the past year (date may be approximate).     Assessment:   This is a routine wellness examination for Monic.  Hearing/Vision screen  Hearing Screening   125Hz  250Hz  500Hz  1000Hz  2000Hz  3000Hz  4000Hz  6000Hz  8000Hz   Right ear:           Left ear:           Vision Screening Comments: Patient gets annual eye exams   Dietary issues and exercise activities discussed: Current Exercise Habits: The patient does not participate in regular exercise at present, Exercise limited by: None identified  Goals    . Patient Stated     07/27/2019, I will maintain and continue medications as prescribed.       Depression Screen PHQ 2/9 Scores 07/27/2019 05/02/2018 11/19/2016 09/18/2014 08/12/2014 08/05/2014 07/22/2014  PHQ - 2 Score 0 0 0 0 0 0 0  PHQ- 9 Score 0 1 4 - - - -    Fall Risk Fall Risk  07/27/2019 11/19/2016 09/18/2014 08/12/2014 08/05/2014  Falls in the past year? 0 No No No No  Number falls in past yr: 0 - - - -  Injury with Fall? 0 - - - -  Risk for fall due to : Medication side effect - - - -  Follow up Falls evaluation completed;Falls prevention discussed - - - -    Is the patient's home free of loose throw rugs in walkways, pet beds, electrical cords, etc?   yes      Grab bars in the bathroom? yes      Handrails on the stairs?   yes      Adequate lighting?   yes  Timed Get Up and Go Performed: N/A  Cognitive Function: MMSE - Mini Mental State Exam 07/27/2019  Orientation to time 5  Orientation to Place 5  Registration 3  Attention/ Calculation 5  Recall 3  Language- repeat 1       Mini Cog  Mini-Cog screen was completed. Maximum score is 22. A value of 0 denotes this part of the  MMSE was not completed or the patient  failed this part of the Mini-Cog screening.  Screening Tests Health Maintenance  Topic Date Due  . TETANUS/TDAP  05/02/2028 (Originally 10/22/2013)  . MAMMOGRAM  08/14/2019  . INFLUENZA VACCINE  10/28/2019  . DEXA SCAN  Completed  . COVID-19 Vaccine  Completed  . PNA vac Low Risk Adult  Completed    Qualifies for Shingles Vaccine: Yes  Cancer Screenings: Lung: Low Dose CT Chest recommended if Age 57-80 years, 30 pack-year currently smoking OR have quit w/in 15 years. Patient does not qualify. Breast: Up to date on Mammogram: Yes, completed 08/14/2018   Bone Density/Dexa: completed 12/27/2016 Colorectal: no longer required  Additional Screenings:  Hepatitis C Screening: N/A     Plan:   Patient will maintain and continue medications as prescribed.   I have personally reviewed and noted the following in the patient's chart:   . Medical and social history . Use of alcohol, tobacco or illicit drugs  . Current medications and supplements . Functional ability and status . Nutritional status . Physical activity . Advanced directives . List of other physicians . Hospitalizations, surgeries, and ER visits in previous 12 months . Vitals . Screenings to include cognitive, depression, and falls . Referrals and appointments  In addition, I have reviewed and discussed with patient certain preventive protocols, quality metrics, and best practice recommendations. A written personalized care plan for preventive services as well as general preventive health recommendations were provided to patient.     Andrez Grime, LPN   [id number]

## 2019-07-27 NOTE — Patient Instructions (Signed)
Sarah Young , Thank you for taking time to come for your Medicare Wellness Visit. I appreciate your ongoing commitment to your health goals. Please review the following plan we discussed and let me know if I can assist you in the future.   Screening recommendations/referrals: Colonoscopy: no longer required Mammogram: Up to date, completed 08/14/2018 Bone Density: completed 12/27/2016 Recommended yearly ophthalmology/optometry visit for glaucoma screening and checkup Recommended yearly dental visit for hygiene and checkup  Vaccinations: Influenza vaccine: Up to date, completed 01/05/2019 Pneumococcal vaccine: Completed series Tdap vaccine: decline Shingles vaccine: discussed    Advanced directives: Please bring a copy of your POA (Power of Attorney) and/or Living Will to your next appointment.   Conditions/risks identified: hypertension, hyperlipidemia  Next appointment: none   Preventive Care 44 Years and Older, Female Preventive care refers to lifestyle choices and visits with your health care provider that can promote health and wellness. What does preventive care include?  A yearly physical exam. This is also called an annual well check.  Dental exams once or twice a year.  Routine eye exams. Ask your health care provider how often you should have your eyes checked.  Personal lifestyle choices, including:  Daily care of your teeth and gums.  Regular physical activity.  Eating a healthy diet.  Avoiding tobacco and drug use.  Limiting alcohol use.  Practicing safe sex.  Taking low-dose aspirin every day.  Taking vitamin and mineral supplements as recommended by your health care provider. What happens during an annual well check? The services and screenings done by your health care provider during your annual well check will depend on your age, overall health, lifestyle risk factors, and family history of disease. Counseling  Your health care provider may ask you  questions about your:  Alcohol use.  Tobacco use.  Drug use.  Emotional well-being.  Home and relationship well-being.  Sexual activity.  Eating habits.  History of falls.  Memory and ability to understand (cognition).  Work and work Statistician.  Reproductive health. Screening  You may have the following tests or measurements:  Height, weight, and BMI.  Blood pressure.  Lipid and cholesterol levels. These may be checked every 5 years, or more frequently if you are over 45 years old.  Skin check.  Lung cancer screening. You may have this screening every year starting at age 79 if you have a 30-pack-year history of smoking and currently smoke or have quit within the past 15 years.  Fecal occult blood test (FOBT) of the stool. You may have this test every year starting at age 79.  Flexible sigmoidoscopy or colonoscopy. You may have a sigmoidoscopy every 5 years or a colonoscopy every 10 years starting at age 79.  Hepatitis C blood test.  Hepatitis B blood test.  Sexually transmitted disease (STD) testing.  Diabetes screening. This is done by checking your blood sugar (glucose) after you have not eaten for a while (fasting). You may have this done every 1-3 years.  Bone density scan. This is done to screen for osteoporosis. You may have this done starting at age 79.  Mammogram. This may be done every 1-2 years. Talk to your health care provider about how often you should have regular mammograms. Talk with your health care provider about your test results, treatment options, and if necessary, the need for more tests. Vaccines  Your health care provider may recommend certain vaccines, such as:  Influenza vaccine. This is recommended every year.  Tetanus, diphtheria, and acellular  pertussis (Tdap, Td) vaccine. You may need a Td booster every 10 years.  Zoster vaccine. You may need this after age 79.  Pneumococcal 13-valent conjugate (PCV13) vaccine. One dose is  recommended after age 79.  Pneumococcal polysaccharide (PPSV23) vaccine. One dose is recommended after age 79. Talk to your health care provider about which screenings and vaccines you need and how often you need them. This information is not intended to replace advice given to you by your health care provider. Make sure you discuss any questions you have with your health care provider. Document Released: 04/11/2015 Document Revised: 12/03/2015 Document Reviewed: 01/14/2015 Elsevier Interactive Patient Education  2017 [place] Prevention in the Home Falls can cause injuries. They can happen to people of all ages. There are many things you can do to make your home safe and to help prevent falls. What can I do on the outside of my home?  Regularly fix the edges of walkways and driveways and fix any cracks.  Remove anything that might make you trip as you walk through a door, such as a raised step or threshold.  Trim any bushes or trees on the path to your home.  Use bright outdoor lighting.  Clear any walking paths of anything that might make someone trip, such as rocks or tools.  Regularly check to see if handrails are loose or broken. Make sure that both sides of any steps have handrails.  Any raised decks and porches should have guardrails on the edges.  Have any leaves, snow, or ice cleared regularly.  Use sand or salt on walking paths during winter.  Clean up any spills in your garage right away. This includes oil or grease spills. What can I do in the bathroom?  Use night lights.  Install grab bars by the toilet and in the tub and shower. Do not use towel bars as grab bars.  Use non-skid mats or decals in the tub or shower.  If you need to sit down in the shower, use a plastic, non-slip stool.  Keep the floor dry. Clean up any water that spills on the floor as soon as it happens.  Remove soap buildup in the tub or shower regularly.  Attach bath mats  securely with double-sided non-slip rug tape.  Do not have throw rugs and other things on the floor that can make you trip. What can I do in the bedroom?  Use night lights.  Make sure that you have a light by your bed that is easy to reach.  Do not use any sheets or blankets that are too big for your bed. They should not hang down onto the floor.  Have a firm chair that has side arms. You can use this for support while you get dressed.  Do not have throw rugs and other things on the floor that can make you trip. What can I do in the kitchen?  Clean up any spills right away.  Avoid walking on wet floors.  Keep items that you use a lot in easy-to-reach places.  If you need to reach something above you, use a strong step stool that has a grab bar.  Keep electrical cords out of the way.  Do not use floor polish or wax that makes floors slippery. If you must use wax, use non-skid floor wax.  Do not have throw rugs and other things on the floor that can make you trip. What can I do with my stairs?  Do not leave any items on the stairs.  Make sure that there are handrails on both sides of the stairs and use them. Fix handrails that are broken or loose. Make sure that handrails are as long as the stairways.  Check any carpeting to make sure that it is firmly attached to the stairs. Fix any carpet that is loose or worn.  Avoid having throw rugs at the top or bottom of the stairs. If you do have throw rugs, attach them to the floor with carpet tape.  Make sure that you have a light switch at the top of the stairs and the bottom of the stairs. If you do not have them, ask someone to add them for you. What else can I do to help prevent falls?  Wear shoes that:  Do not have high heels.  Have rubber bottoms.  Are comfortable and fit you well.  Are closed at the toe. Do not wear sandals.  If you use a stepladder:  Make sure that it is fully opened. Do not climb a closed  stepladder.  Make sure that both sides of the stepladder are locked into place.  Ask someone to hold it for you, if possible.  Clearly mark and make sure that you can see:  Any grab bars or handrails.  First and last steps.  Where the edge of each step is.  Use tools that help you move around (mobility aids) if they are needed. These include:  Canes.  Walkers.  Scooters.  Crutches.  Turn on the lights when you go into a dark area. Replace any light bulbs as soon as they burn out.  Set up your furniture so you have a clear path. Avoid moving your furniture around.  If any of your floors are uneven, fix them.  If there are any pets around you, be aware of where they are.  Review your medicines with your doctor. Some medicines can make you feel dizzy. This can increase your chance of falling. Ask your doctor what other things that you can do to help prevent falls. This information is not intended to replace advice given to you by your health care provider. Make sure you discuss any questions you have with your health care provider. Document Released: 01/09/2009 Document Revised: 08/21/2015 Document Reviewed: 04/19/2014 Elsevier Interactive Patient Education  2017 [employer].

## 2019-07-27 NOTE — Progress Notes (Signed)
PCP notes:  Health Maintenance: No gaps noted    Abnormal Screenings: none   Patient concerns: none   Nurse concerns: none   Next PCP appt: none 

## 2019-07-30 ENCOUNTER — Ambulatory Visit (HOSPITAL_COMMUNITY): Payer: Medicare Other | Attending: Cardiology

## 2019-07-30 ENCOUNTER — Other Ambulatory Visit: Payer: Self-pay

## 2019-07-30 DIAGNOSIS — R0602 Shortness of breath: Secondary | ICD-10-CM

## 2019-07-30 DIAGNOSIS — Z86711 Personal history of pulmonary embolism: Secondary | ICD-10-CM | POA: Diagnosis not present

## 2019-07-31 ENCOUNTER — Telehealth: Payer: Self-pay | Admitting: *Deleted

## 2019-07-31 NOTE — Telephone Encounter (Signed)
Left VM requesting pt to call the office back regarding ECG results

## 2019-08-02 NOTE — Telephone Encounter (Signed)
Addressed through result notes  

## 2019-08-03 ENCOUNTER — Telehealth: Payer: Self-pay | Admitting: Family Medicine

## 2019-08-03 DIAGNOSIS — R0602 Shortness of breath: Secondary | ICD-10-CM

## 2019-08-03 NOTE — Telephone Encounter (Signed)
Sent to [facility].

## 2019-08-03 NOTE — Telephone Encounter (Signed)
-----   Message from Tammi Sou, [place] sent at 08/03/2019  9:37 AM EDT ----- Pt notified of Dr. Marliss Coots comments she will check in with oncology. Pt does agree with pulmo referral. She said she favors [place] over [place] but she will go wherever she can get an appt, I advised pt our [facility] will call to schedule appt

## 2019-08-03 NOTE — Telephone Encounter (Signed)
Pulmonary Appt scheduled with Dr Noemi Chapel on 08/07/19 at 9:45am, patient notified

## 2019-08-07 ENCOUNTER — Encounter: Payer: Self-pay | Admitting: Critical Care Medicine

## 2019-08-07 ENCOUNTER — Other Ambulatory Visit: Payer: Self-pay

## 2019-08-07 ENCOUNTER — Ambulatory Visit: Payer: Medicare Other | Admitting: Critical Care Medicine

## 2019-08-07 ENCOUNTER — Other Ambulatory Visit: Payer: Self-pay | Admitting: Critical Care Medicine

## 2019-08-07 VITALS — BP 130/60 | HR 78 | Temp 98.0°F | Ht 65.0 in | Wt 151.4 lb

## 2019-08-07 DIAGNOSIS — R0600 Dyspnea, unspecified: Secondary | ICD-10-CM

## 2019-08-07 DIAGNOSIS — Z86711 Personal history of pulmonary embolism: Secondary | ICD-10-CM | POA: Diagnosis not present

## 2019-08-07 DIAGNOSIS — R0609 Other forms of dyspnea: Secondary | ICD-10-CM

## 2019-08-07 NOTE — Progress Notes (Signed)
Synopsis: Referred in May 2021 for shortness of breath by Abner Greenspan, MD  Subjective:   PATIENT ID: Sarah Young GENDER: female DOB: [birth year], MRN: [id number]  Chief Complaint  Patient presents with  . Consult    SOB since Dec 2020, cough at night when laying down, SOB when talking     Sarah Young is a 79 year old woman with a history of breast cancer on the right, DVT in the 1980s who presents for evaluation of dyspnea on exertion.  She began having symptoms that she attributed to bronchitis in mid December with pain in her chest, cough, shortness of breath.  A trial of antibiotics did not help.  She was evaluated at the respiratory clinic and was found to be Covid negative.  Chest x-ray was normal.  She was referred for cardiology evaluation, with normal echocardiogram and nuclear medicine scan.  She was given a trial of steroids, which also did not help.  She followed back up with her primary care physician and eventually had a CT a which confirmed PE.  She had a left lower extremity DVT.  She has been on Xarelto since February with only mild improvement in her symptoms.  At no point have her symptoms worsened over this course, but they have been quite persistent.  She continues to have orthopnea requiring her to sleep on 3 pillows, mild dry cough, dyspnea with exertion and with talking.  No sputum production or wheezing.  She had an episode of dizziness associated with exertion about a month ago.  Overall she feels like her activity is significantly limited by her dyspnea.  All of the symptoms began in mid December.  Her tamoxifen which she had been on for 5 years was stopped by oncology after her diagnosis.  They are concerned for possible hypercoagulability given her previous DVT with only known risk factor of oral contraceptives.  She underwent a recent repeat CTA chest to evaluate for additional clots on anticoagulation given her persistent shortness of breath.  There were no acute  findings.  She is a never smoker.  There is no family history of lung disease.  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  Follows with [name] in oncology; most recent visit 12/2018 with no signs of recurrence.      Past Medical History:  Diagnosis Date  . Anxiety   . Arthritis    OA of hands  . Breast cancer of upper-outer quadrant of right female breast ([place]) 05/08/2014  . DVT (deep venous thrombosis) ([place]) 1980s  . Fibromyalgia   . Hyperlipidemia   . Hypertension   . Irritable bowel syndrome   . Migraine   . Osteopenia   . S/P radiation therapy 06/2014   Right breast 4250 cGy in 17 sessions, right breast boost 1200 cGy in 6 sessions= 07/11/2014 through 08/12/2014   . Vitamin D deficiency   . Wears glasses      Family History  Problem Relation Age of Onset  . Hypertension Father   . Cancer Father 49       esophageal - smoker  . Leukemia Mother   . Cancer Mother 34       leukemia  . Cancer Brother 63       breast CA  . Breast cancer Brother 93  . Cancer Maternal Aunt 80       leukemia  . Cancer  Paternal Aunt 77       breast  . Breast cancer Paternal Aunt 74  . Breast cancer Maternal Grandmother 80     Past Surgical History:  Procedure Laterality Date  . ABDOMINAL HYSTERECTOMY  1972  . APPENDECTOMY    . BREAST EXCISIONAL BIOPSY Left   . BREAST LUMPECTOMY Right 2016  . BREAST SURGERY  1995   lumpectomy fibrocystic breast-lt  . CATARACT EXTRACTION     both  . CHOLECYSTECTOMY  2005  . COLONOSCOPY    . ovarian cyst removed  1985  . RADIOACTIVE SEED GUIDED PARTIAL MASTECTOMY WITH AXILLARY SENTINEL LYMPH NODE BIOPSY Right 05/30/2014   Procedure: RADIOACTIVE SEED GUIDED PARTIAL MASTECTOMY WITH AXILLARY SENTINEL LYMPH NODE BIOPSY;  Surgeon: Autumn Messing III, MD;  Location: [facility];  Service: General;  Laterality:  Right;    Social History   Socioeconomic History  . Marital status: Married    Spouse name: Not on file  . Number of children: Not on file  . Years of education: Not on file  . Highest education level: Not on file  Occupational History  . 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 Smoking status: Never Smoker  . Smokeless tobacco: Never Used  . Tobacco comment: non smoker  Substance and Sexual Activity  . Alcohol use: No    Alcohol/week: 0.0 standard drinks  . Drug use: No  . Sexual activity: Not Currently  Other Topics Concern  . Not on file  Social History Narrative   Usually very active.      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   Social Determinants of Health   Financial Resource Strain: Low Risk   . Difficulty of Paying Living Expenses: Not hard at all  Food Insecurity: No Food Insecurity  . Worried About Charity fundraiser in the Last Year: Never true  . Ran Out of Food in the Last Year: Never true  Transportation Needs: No Transportation Needs  . Lack of Transportation (Medical): No  . Lack of Transportation (Non-Medical): No  Physical Activity: Inactive  . Days of Exercise per Week: 0 days  . Minutes of Exercise per Session: 0 min  Stress: No Stress Concern Present  . Feeling of Stress : Not at all  Social Connections:   . Frequency of Communication with Friends and Family:   . Frequency of Social Gatherings with Friends and Family:   . Attends Religious Services:   . Active Member of Clubs or Organizations:   . Attends Archivist Meetings:   Marland Kitchen Marital Status:   Intimate Partner Violence: Not At Risk  . Fear of Current or Ex-Partner: No  . Emotionally Abused: No  . Physically Abused: No  . Sexually Abused: No     Allergies  Allergen Reactions  . Amoxicillin Itching    REACTION: reacts opposite effect made infection worse.  Marland Kitchen Amoxicillin-Pot Clavulanate     REACTION: Felt like body  turned inside out  . Esomeprazole Magnesium     REACTION: Nausea  . Gabapentin   . Omeprazole Nausea And Vomiting    REACTION: not effective  . Ranitidine Hcl     REACTION: not effective     Immunization History  Administered Date(s) Administered  . Influenza Whole 12/02/2009  . Influenza, High Dose Seasonal PF 02/06/2018, 01/05/2019  . Influenza,inj,Quad PF,6+ Mos 04/20/2013, 03/04/2015, 12/15/2015  . Influenza-Unspecified 01/24/2017  . PFIZER SARS-COV-2 Vaccination 05/05/2019, 05/30/2019  .  Pneumococcal Conjugate-13 03/04/2015  . Pneumococcal Polysaccharide-23 05/11/2007  . Td 10/23/2003    Outpatient Medications Prior to Visit  Medication Sig Dispense Refill  . amitriptyline (ELAVIL) 10 MG tablet Take 10 mg by mouth at bedtime.    Marland Kitchen amLODipine (NORVASC) 5 MG tablet TAKE 1 TABLET BY MOUTH DAILY. 90 tablet 2  . Ascorbic Acid (VITAMIN C) 100 MG tablet Take 100 mg by mouth daily.    . Cholecalciferol (VITAMIN D PO) Take 1 tablet by mouth 2 (two) times daily. Pt takes 2000 units daily    . SUMAtriptan (IMITREX) 100 MG tablet TAKE 1 TABLET BY MOUTH AS NEEDED FOR MIGRAINE, MAY REPEAT IN 2 HOURS IF NEEDED. MAX 2 TABS IN 24 HOURS. 9 tablet 3  . XARELTO 20 MG TABS tablet TAKE 1 TABLET BY MOUTH DAILY WITH SUPPER. 30 tablet 5  . calcium carbonate 200 MG capsule Take 250 mg by mouth 2 (two) times daily.    Marland Kitchen Spacer/Aero-Holding Chambers (AEROCHAMBER MINI CHAMBER) [place] For use with albuterol inhaler 1 each 0   No facility-administered medications prior to visit.    Review of Systems  Constitutional: Negative for chills, fever and weight loss.  Respiratory: Positive for cough and shortness of breath. Negative for sputum production and wheezing.   Cardiovascular: Positive for orthopnea. Negative for chest pain and leg swelling.  Gastrointestinal: Negative.   Genitourinary: Negative.   Musculoskeletal: Positive for myalgias. Negative for joint pain.       Fibromyalgia  Neurological:  Positive for dizziness. Negative for focal weakness.     Objective:   Vitals:   08/07/19 0954  BP: 130/60  Pulse: 78  Temp: 98 F (36.7 C)  TempSrc: Temporal  SpO2: 98%  Weight: 151 lb 6.4 oz (68.7 kg)  Height: 5\' 5"  (1.651 m)   98% on   RA BMI Readings from Last 3 Encounters:  08/07/19 25.19 kg/m  07/27/19 25.18 kg/m  07/11/19 25.18 kg/m   Wt Readings from Last 3 Encounters:  08/07/19 151 lb 6.4 oz (68.7 kg)  07/27/19 151 lb 5 oz (68.6 kg)  07/11/19 151 lb 5 oz (68.6 kg)    Physical Exam Vitals reviewed.  Constitutional:      General: She is not in acute distress.    Appearance: She is not ill-appearing.  HENT:     Head: Normocephalic and atraumatic.  Eyes:     General: No scleral icterus. Cardiovascular:     Rate and Rhythm: Normal rate. Rhythm irregular.     Heart sounds: No murmur.  Abdominal:     General: There is no distension.     Palpations: Abdomen is soft.     Tenderness: There is no abdominal tenderness.  Musculoskeletal:        General: No swelling or deformity.     Cervical back: Neck supple.  Lymphadenopathy:     Cervical: No cervical adenopathy.  Skin:    General: Skin is warm and dry.     Findings: No rash.  Neurological:     General: No focal deficit present.     Mental Status: She is alert.     Coordination: Coordination normal.  Psychiatric:        Mood and Affect: Mood normal.        Behavior: Behavior normal.      CBC    Component Value Date/Time   WBC 6.9 07/11/2019 1305   RBC 4.53 07/11/2019 1305   HGB 13.6 07/11/2019 1305   HGB 13.2 02/03/2017  0932   HCT 41.4 07/11/2019 1305   HCT 40.1 02/03/2017 0932   PLT 216.0 07/11/2019 1305   PLT 198 02/03/2017 0932   MCV 91.5 07/11/2019 1305   MCV 92.0 02/03/2017 0932   MCH 29.7 05/25/2019 1026   MCHC 32.7 07/11/2019 1305   RDW 14.6 07/11/2019 1305   RDW 14.0 02/03/2017 0932   LYMPHSABS 1.8 07/11/2019 1305   LYMPHSABS 1.3 02/03/2017 0932   MONOABS 0.5 07/11/2019 1305     MONOABS 0.4 02/03/2017 0932   EOSABS 0.1 07/11/2019 1305   EOSABS 0.1 02/03/2017 0932   BASOSABS 0.1 07/11/2019 1305   BASOSABS 0.1 02/03/2017 0932    CHEMISTRY No results for input(s): NA, K, CL, CO2, GLUCOSE, BUN, CREATININE, CALCIUM, MG, PHOS in the last 168 hours. CrCl cannot be calculated (Patient's most recent lab result is older than the maximum 21 days allowed.).   Chest Imaging- films reviewed: CTA chest 07/12/2019-minimal subpleural fibrosis right upper lobe, faint basilar reticular changes dependent bilateral lower lobes.  Left lower lobe linear scars.  No PE, no mediastinal or hilar adenopathy.  February 2021 CT also demonstrating subpleural right upper lobe and bilateral dependent lower lobe areas of fibrosis.  Abdominal CT scan 2011 also demonstrating bilateral lower lobe dependent fibrotic changes-worse in the RLL, similar in the LLL.  Pulmonary Functions Testing Results: No flowsheet data found.   Echocardiogram 07/30/2019: LVEF 55 to [id number], grade 1 diastolic dysfunction.  Normal LA, RV, RA.  Normal PASP.  Trivial pericardial effusion.  Trivial MR and TR.  Mild aortic sclerosis without stenosis. Previous echocardiogram 04/13/2019 with mildly elevated PASP and RVSP.  NM SPECT 04/11/2019: Low risk study, LVEF greater than 65%.  No ischemic changes.    Assessment & Plan:     ICD-10-CM   1. DOE (dyspnea on exertion)  R06.00 Pulmonary function test    NM Pulmonary Per & Vent  2. History of pulmonary embolus (PE)  Z86.711 NM Pulmonary Per & Vent    Chronic DOE- unlikely due to radiation fibrosis due to chronicity and minimal radiographic involvement. Similarly basilar fibrosis appears unchanged if not better compared to 10 years ago. PE likely explains this, possibly worsened by deconditioning due to the duration of her symptoms. HFpEF could contribute, but is a diagnosis of exclusion. -PFTs -VQ scan to r/o CTEPH given history of previous DVT with only minimal risk factors. If  positive, may require RHC to evaluate PA pressures. -Discussed the importance of regular physical activity.  Cautions to stop should include chest pain, dizziness, lightheadedness, felt like she is going to pass out.  History of unprovoked PE, DVT in remote past with minor risk factor of OCPs -VQ scan -Consider indefinite anticoagulation.  Agree with ongoing hematologic hypercoagulability work-up.   RTC in 2 months.   Current Outpatient Medications:  .  amitriptyline (ELAVIL) 10 MG tablet, Take 10 mg by mouth at bedtime., Disp: , Rfl:  .  amLODipine (NORVASC) 5 MG tablet, TAKE 1 TABLET BY MOUTH DAILY., Disp: 90 tablet, Rfl: 2 .  Ascorbic Acid (VITAMIN C) 100 MG tablet, Take 100 mg by mouth daily., Disp: , Rfl:  .  Cholecalciferol (VITAMIN D PO), Take 1 tablet by mouth 2 (two) times daily. Pt takes 2000 units daily, Disp: , Rfl:  .  SUMAtriptan (IMITREX) 100 MG tablet, TAKE 1 TABLET BY MOUTH AS NEEDED FOR MIGRAINE, MAY REPEAT IN 2 HOURS IF NEEDED. MAX 2 TABS IN 24 HOURS., Disp: 9 tablet, Rfl: 3 .  XARELTO 20 MG  TABS tablet, TAKE 1 TABLET BY MOUTH DAILY WITH SUPPER., Disp: 30 tablet, Rfl: 5 .  calcium carbonate 200 MG capsule, Take 250 mg by mouth 2 (two) times daily., Disp: , Rfl:  .  Spacer/Aero-Holding Chambers (AEROCHAMBER MINI CHAMBER) [place], For use with albuterol inhaler, Disp: 1 each, Rfl: 0     Julian Hy, DO [facility] 08/07/2019 10:39 AM

## 2019-08-07 NOTE — Patient Instructions (Addendum)
Thank you for visiting Dr. Carlis Abbott at [facility]. We recommend the following: Orders Placed This Encounter  Procedures  . NM Pulmonary Per & Vent  . Pulmonary function test   Orders Placed This Encounter  Procedures  . NM Pulmonary Per & Vent    First available    Standing Status:   Future    Standing Expiration Date:   10/06/2020    Order Specific Question:   ** REASON FOR EXAM (FREE TEXT)    Answer:   r/o CTEPH, history of PE and remote history of unprovoked DVT    Order Specific Question:   If indicated for the ordered procedure, I authorize the administration of a radiopharmaceutical per Radiology protocol    Answer:   Yes    Order Specific Question:   Preferred imaging location?    Answer:   [facility]    Order Specific Question:   Radiology Contrast Protocol - do NOT remove file path    Answer:   [email or web address]  . Pulmonary function test    Standing Status:   Future    Standing Expiration Date:   08/06/2020    Order Specific Question:   Where should this test be performed?    Answer:    Pulmonary    Order Specific Question:   Full PFT: includes the following: basic spirometry, spirometry pre & post bronchodilator, diffusion capacity (DLCO), lung volumes    Answer:   Full PFT     Return in about 6 weeks (around 09/18/2019). after PFTs    Please do your part to reduce the spread of COVID-19.

## 2019-08-13 ENCOUNTER — Other Ambulatory Visit: Payer: Self-pay | Admitting: Family Medicine

## 2019-08-13 NOTE — Telephone Encounter (Signed)
Electronic refill request. Amitriptyline Last office visit:   07/11/2019 Last Filled:   By Julian Hy, DO Please advise.

## 2019-08-14 ENCOUNTER — Ambulatory Visit (HOSPITAL_COMMUNITY): Payer: Medicare Other

## 2019-08-16 ENCOUNTER — Other Ambulatory Visit: Payer: Self-pay

## 2019-08-16 ENCOUNTER — Inpatient Hospital Stay: Payer: Medicare Other | Attending: Oncology

## 2019-08-16 DIAGNOSIS — Z86711 Personal history of pulmonary embolism: Secondary | ICD-10-CM | POA: Insufficient documentation

## 2019-08-16 DIAGNOSIS — Z803 Family history of malignant neoplasm of breast: Secondary | ICD-10-CM | POA: Insufficient documentation

## 2019-08-16 DIAGNOSIS — Z7901 Long term (current) use of anticoagulants: Secondary | ICD-10-CM | POA: Insufficient documentation

## 2019-08-16 DIAGNOSIS — R0602 Shortness of breath: Secondary | ICD-10-CM | POA: Insufficient documentation

## 2019-08-16 DIAGNOSIS — Z170 Estrogen receptor positive status [ER+]: Secondary | ICD-10-CM | POA: Diagnosis not present

## 2019-08-16 DIAGNOSIS — I82442 Acute embolism and thrombosis of left tibial vein: Secondary | ICD-10-CM | POA: Diagnosis not present

## 2019-08-16 DIAGNOSIS — C50412 Malignant neoplasm of upper-outer quadrant of left female breast: Secondary | ICD-10-CM | POA: Diagnosis not present

## 2019-08-16 DIAGNOSIS — R0789 Other chest pain: Secondary | ICD-10-CM | POA: Insufficient documentation

## 2019-08-16 DIAGNOSIS — I82402 Acute embolism and thrombosis of unspecified deep veins of left lower extremity: Secondary | ICD-10-CM

## 2019-08-16 DIAGNOSIS — Z923 Personal history of irradiation: Secondary | ICD-10-CM | POA: Insufficient documentation

## 2019-08-16 DIAGNOSIS — C50411 Malignant neoplasm of upper-outer quadrant of right female breast: Secondary | ICD-10-CM

## 2019-08-16 LAB — D-DIMER, QUANTITATIVE: D-Dimer, Quant: 0.27 ug/mL-FEU (ref 0.00–0.50)

## 2019-08-16 LAB — CBC WITH DIFFERENTIAL/PLATELET
Abs Immature Granulocytes: 0.01 10*3/uL (ref 0.00–0.07)
Basophils Absolute: 0.1 10*3/uL (ref 0.0–0.1)
Basophils Relative: 1 %
Eosinophils Absolute: 0.1 10*3/uL (ref 0.0–0.5)
Eosinophils Relative: 2 %
HCT: 44.1 % (ref 36.0–46.0)
Hemoglobin: 14.2 g/dL (ref 12.0–15.0)
Immature Granulocytes: 0 %
Lymphocytes Relative: 33 %
Lymphs Abs: 1.9 10*3/uL (ref 0.7–4.0)
MCH: 29.6 pg (ref 26.0–34.0)
MCHC: 32.2 g/dL (ref 30.0–36.0)
MCV: 92.1 fL (ref 80.0–100.0)
Monocytes Absolute: 0.5 10*3/uL (ref 0.1–1.0)
Monocytes Relative: 9 %
Neutro Abs: 3.1 10*3/uL (ref 1.7–7.7)
Neutrophils Relative %: 55 %
Platelets: 241 10*3/uL (ref 150–400)
RBC: 4.79 MIL/uL (ref 3.87–5.11)
RDW: 13.7 % (ref 11.5–15.5)
WBC: 5.7 10*3/uL (ref 4.0–10.5)
nRBC: 0 % (ref 0.0–0.2)

## 2019-08-16 LAB — COMPREHENSIVE METABOLIC PANEL
ALT: 18 U/L (ref 0–44)
AST: 22 U/L (ref 15–41)
Albumin: 3.5 g/dL (ref 3.5–5.0)
Alkaline Phosphatase: 70 U/L (ref 38–126)
Anion gap: 10 (ref 5–15)
BUN: 19 mg/dL (ref 8–23)
CO2: 27 mmol/L (ref 22–32)
Calcium: 9.4 mg/dL (ref 8.9–10.3)
Chloride: 103 mmol/L (ref 98–111)
Creatinine, Ser: 1.22 mg/dL — ABNORMAL HIGH (ref 0.44–1.00)
GFR calc Af Amer: 49 mL/min — ABNORMAL LOW (ref >60)
GFR calc non Af Amer: 42 mL/min — ABNORMAL LOW (ref >60)
Glucose, Bld: 89 mg/dL (ref 70–99)
Potassium: 4.8 mmol/L (ref 3.5–5.1)
Sodium: 140 mmol/L (ref 135–145)
Total Bilirubin: 0.5 mg/dL (ref 0.3–1.2)
Total Protein: 6.7 g/dL (ref 6.5–8.1)

## 2019-08-17 ENCOUNTER — Telehealth: Payer: Self-pay

## 2019-08-17 ENCOUNTER — Telehealth: Payer: Self-pay | Admitting: Cardiology

## 2019-08-17 ENCOUNTER — Ambulatory Visit (HOSPITAL_COMMUNITY)
Admission: RE | Admit: 2019-08-17 | Discharge: 2019-08-17 | Disposition: A | Discharge status: Home / Self Care | Payer: Medicare Other | Source: Ambulatory Visit | Attending: Critical Care Medicine | Admitting: Critical Care Medicine

## 2019-08-17 ENCOUNTER — Other Ambulatory Visit: Payer: Self-pay | Admitting: Critical Care Medicine

## 2019-08-17 DIAGNOSIS — Z86711 Personal history of pulmonary embolism: Secondary | ICD-10-CM

## 2019-08-17 DIAGNOSIS — R0609 Other forms of dyspnea: Secondary | ICD-10-CM

## 2019-08-17 DIAGNOSIS — R0602 Shortness of breath: Secondary | ICD-10-CM

## 2019-08-17 DIAGNOSIS — R0600 Dyspnea, unspecified: Secondary | ICD-10-CM | POA: Diagnosis not present

## 2019-08-17 MED ORDER — TECHNETIUM TO 99M ALBUMIN AGGREGATED
1.6000 | Freq: Once | INTRAVENOUS | Status: AC | PRN
  Administered 2019-08-17: 1.6 via INTRAVENOUS

## 2019-08-17 NOTE — Telephone Encounter (Signed)
I spoke to her She only wants to see Dr Burt Knack Will send to pcc

## 2019-08-17 NOTE — Telephone Encounter (Signed)
Patient contacted the office and states that she had some scans done and she wants to speak with [place] or Dr. Glori Bickers, as she has some questions and concerns. I asked patient if there was anything I could help her with, but patient states she just wanted to speak with [place] or Dr. Glori Bickers.

## 2019-08-17 NOTE — Telephone Encounter (Signed)
New Message   Patient is requesting to switch from Dr. Ellyn Hack to Dr. Percival Spanish. Please advise.

## 2019-08-17 NOTE — Telephone Encounter (Signed)
**  PT WOULD LIKE DR. TOWER TO CALL HER BACK DIRECTLY [phone]**  I called pt back to see what I could help with. Pt said that she saw Dr. Carlis Abbott (pulmo) and during the visit pt was advise that they thought it could be her heart causing this severe SOB, and that Dr. Carlis Abbott would order scans/test. When pt went to appt today for scans/test, they were all lung test. Pt is upset because she thought given the conversation with Dr. Carlis Abbott that they were testing her heart. Pt said her SOB is still severe and she hasn't had any improvement in over 6 months with this issue. Pt because tearful on the phone and asked Dr. Glori Bickers to call her back to discuss a "new plan" pt is asking for a referral to Dr. Burt Knack (cardiologist) because he saved pt's husbands life twice and she has "all faith in his abilities" pt said when she went for the scan as soon as she laid down she was gasping for air. every time she lays down the SOB is worse. Pt said she doesn't have a f/u with pulmonary until 09/27/19 and feels like it's going to be a waste of time because no lung test has shown anything and also she questions the long time frame it's going to take to see Dr. Carlis Abbott. Pt not sure what type of condition she will be in by then. She said she was going to call Dr. Carlis Abbott with her issues at 1st but she said Dr. Glori Bickers knows her better and wants to start with PCP to again try and make a new plan on why she is breathing so badly  Please call pt back to discuss her issues, pt very upset

## 2019-08-17 NOTE — Telephone Encounter (Signed)
Looks like she did not need a return visit with a cardiologist.  Does she have a new problem?

## 2019-08-17 NOTE — Telephone Encounter (Signed)
I can certainly do the referral.  If Dr Burt Knack is not available in a timely manner would she be ok with seeing and extender first while waiting for him (I just do not want her to wait that long if there is a wait)  Let me know and I will place the referral urgently   Thanks for the update

## 2019-08-17 NOTE — Telephone Encounter (Signed)
Per pt she only wanted to see Dr. Burt Knack, discussed with PCP, will route back so Dr. Glori Bickers can call her back

## 2019-08-19 NOTE — Telephone Encounter (Signed)
That is what I thought to.  But I am peripherally happy to have her change.  Never a good idea to see a person who wants to change to a different doctor.Glenetta Hew, MD c

## 2019-08-20 NOTE — Telephone Encounter (Signed)
Dr. Glori Bickers referred her back to [place] for sob on exertion. Are you okay with the switch. Dr. Percival Spanish.

## 2019-08-20 NOTE — Telephone Encounter (Signed)
OK 

## 2019-08-22 NOTE — Progress Notes (Signed)
Sarah Young  [phone] [phone]     ID: Sarah Young DOB: [birth year]  MR#: [id number]  KJZ#:[id number]  Patient Care Team: Abner Greenspan, MD as PCP - Brilliant, [place], MD as Consulting Physician (General Surgery) Koa Palla, Virgie Dad, MD as Consulting Physician (Oncology) Arloa Koh, MD (Inactive) as Consulting Physician (Radiation Oncology) Rockwell Germany, RN as Registered Nurse Mauro Kaufmann, RN as Registered Nurse Holley Bouche, NP (Inactive) as Nurse Practitioner (Nurse Practitioner) Sylvan Cheese, NP as Nurse Practitioner (Nurse Practitioner)OTHER MD: Nena Polio M.D.  CHIEF COMPLAINT: Estrogen receptor positive breast cancer  CURRENT TREATMENT: rivaroxaban   INTERVAL HISTORY: Sarah Young returns today for follow-up of her estrogen receptor positive breast cancer.  She is now under observation alone as far as her breast cancer is concerned.  Since her last visit, she underwent liver MRI on 06/13/2019 showing no evidence of hepatic metastasis.  There was evidence of aortic atherosclerosis.  She was referred to pulmonology because of continuing breathing complaints and underwent angio chest CT on 07/12/2019 showing: resolution of previously-seen pulmonary emboli; no new pulmonary emboli identified.  Finally, she underwent pulmonary perfusion scan on 08/17/2019 showing no perfusion defects evident.  She is currently behind on her mammography.  REVIEW OF SYSTEMS: Sarah Young continues to have shortness of breath.  This is present even at rest.  Sitting there speaking with me she feels it is a little difficult to withdraw air in.  She tells me she cannot sleep flat at night.  She has to have her back propped up.  She denies cough phlegm production pleurisy or hemoptysis.  She is tolerating the rivaroxaban without any unusual bleeding although she has a small right scleral hemorrhage currently, likely from rubbing her eyes  secondary to allergies.  She had one episode of epistaxis in the last month, which was mild.  She is able to afford the rivaroxaban without any major problems but she would like me to put that prescription over to [facility] which I will be glad to do.  A detailed review of systems today was otherwise stable   BREAST CANCER HISTORY: From the original intake note:  [name] had routine bilateral screening mammography [facility] 17 2014. This found the breast density to be category C. A possible mass was noted in the right breast, and on 05/03/2014 she underwent right diagnostic mammography with ultrasonography. There was a spiculated mass in the upper outer quadrant of the right breast which was new since the prior mammogram. On physical exam there was mild nodularity noted in the area in question. Ultrasound confirmed a hypoechoic irregular mass measuring 0.9 cm in the upper outer quadrant.  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  MRI of the breasts is scheduled for 05/20/2014. The patient's subsequent history is as detailed below   PAST MEDICAL HISTORY: Past Medical History:  Diagnosis Date  . Anxiety   . Arthritis    OA of hands  . Breast cancer of upper-outer quadrant of right female breast ([place]) 05/08/2014  . DVT (deep venous thrombosis) ([place]) 1980s  . Fibromyalgia   . Hyperlipidemia   . Hypertension   . Irritable bowel syndrome   . Migraine   .  Osteopenia   . S/P radiation therapy 06/2014   Right breast 4250 cGy in 17 sessions, right breast boost 1200 cGy in 6 sessions= 07/11/2014 through 08/12/2014   . Vitamin D deficiency   . Wears glasses     PAST  SURGICAL HISTORY: Past Surgical History:  Procedure Laterality Date  . ABDOMINAL HYSTERECTOMY  1972  . APPENDECTOMY    . BREAST EXCISIONAL BIOPSY Left   . BREAST LUMPECTOMY Right 2016  . BREAST SURGERY  1995   lumpectomy fibrocystic breast-lt  . CATARACT EXTRACTION     both  . CHOLECYSTECTOMY  2005  . COLONOSCOPY    . ovarian cyst removed  1985  . RADIOACTIVE SEED GUIDED PARTIAL MASTECTOMY WITH AXILLARY SENTINEL LYMPH NODE BIOPSY Right 05/30/2014   Procedure: RADIOACTIVE SEED GUIDED PARTIAL MASTECTOMY WITH AXILLARY SENTINEL LYMPH NODE BIOPSY;  Surgeon: Autumn Messing III, MD;  Location: [facility];  Service: General;  Laterality: Right;    FAMILY HISTORY Family History  Problem Relation Age of Onset  . Hypertension Father   . Cancer Father 5       esophageal - smoker  . Leukemia Mother   . Cancer Mother 95       leukemia  . Cancer Brother 57       breast CA  . Breast cancer Brother 25  . Cancer Maternal Aunt 80       leukemia  . Cancer Paternal Aunt 48       breast  . Breast cancer Paternal Aunt 80  . Breast cancer Maternal Grandmother 42   the patient's father died at the age of 75 with gastric cancer. The patient's mother was diagnosed with chronic leukemia at age 82 and died from complications of dementia at age 73 the patient has one brother, diagnosed with breast cancer at the age of 4. She has 2 sisters. There is no other history of breast or ovarian cancer in the family.   GYNECOLOGIC HISTORY:  No LMP recorded. Patient has had a hysterectomy. Menarche age 76, first live birth age 78. The patient is GX P2. She underwent total abdominal hysterectomy with bilateral salpingo-oophorectomy 1972. She used hormone replacement for approximately 38 years, until 2011.   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 Her husband Sarah Young is currently not active in the business. Daughter Sarah Young, lives in  [place], is Pres. The other daughter, Sarah Young, lives in [place] where she works as a Landscape architect. The patient has 3 grandchildren. She attends a local [employer] DIRECTIVES: The patient has named both her husband Sarah Young and her daughter Sarah Young as Equities trader powers of attorney.   HEALTH MAINTENANCE: Social History   Tobacco Use  . Smoking status: Never Smoker  . Smokeless tobacco: Never Used  . Tobacco comment: non smoker  Substance Use Topics  . Alcohol use: No    Alcohol/week: 0.0 standard drinks  . Drug use: No     Colonoscopy:  PAP:  Bone density:  Lipid panel:  Allergies  Allergen Reactions  . Amoxicillin Itching    REACTION: reacts opposite effect made infection worse.  Marland Kitchen Amoxicillin-Pot Clavulanate     REACTION: Felt like body turned inside out  . Esomeprazole Magnesium     REACTION: Nausea  . Gabapentin   . Omeprazole Nausea And Vomiting    REACTION: not effective  . Ranitidine Hcl     REACTION:  not effective    Current Outpatient Medications  Medication Sig Dispense Refill  . amitriptyline (ELAVIL) 10 MG tablet TAKE 2 TABLETS (20 MG TOTAL) BY MOUTH AT BEDTIME. 60 tablet 11  . amLODipine (NORVASC) 5 MG tablet TAKE 1 TABLET BY MOUTH DAILY. 90 tablet 2  . Ascorbic Acid (VITAMIN C) 100 MG tablet Take 100 mg by mouth daily.    . calcium carbonate 200 MG capsule Take 250 mg by mouth 2 (two) times daily.    . Cholecalciferol (VITAMIN D PO) Take 1 tablet by mouth 2 (two) times daily. Pt takes 2000 units daily    . Spacer/Aero-Holding Chambers (AEROCHAMBER MINI CHAMBER) DEVI For use with albuterol inhaler 1 each 0  . SUMAtriptan (IMITREX) 100 MG tablet TAKE 1 TABLET BY MOUTH AS NEEDED FOR MIGRAINE, MAY REPEAT IN 2 HOURS IF NEEDED. MAX 2 TABS IN 24 HOURS. 9 tablet 3  . XARELTO 20 MG TABS tablet TAKE 1 TABLET BY MOUTH DAILY WITH SUPPER. 30 tablet 5   No current facility-administered medications for this visit.     OBJECTIVE: white woman in no acute distress  Vitals:   08/23/19 1022  BP: (!) 122/59  Pulse: 77  Resp: 18  Temp: 97.9 F (36.6 C)  SpO2: 100%   Wt Readings from Last 3 Encounters:  08/23/19 152 lb 6.4 oz (69.1 kg)  08/07/19 151 lb 6.4 oz (68.7 kg)  07/27/19 151 lb 5 oz (68.6 kg)   Body mass index is 25.36 kg/m.    ECOG FS:1 - Symptomatic but completely ambulatory  Small right scleral hemorrhage, pupils round and equal with normal EOMs Wearing a mask No cervical or supraclavicular adenopathy Lungs no rales or rhonchi Heart regular rate and rhythm Abd soft, nontender, positive bowel sounds MSK no focal spinal tenderness, no upper extremity lymphedema Neuro: nonfocal, well oriented, appropriate affect Breasts: The right breast has undergone lumpectomy and radiation.  There is no evidence of local recurrence.  The cosmetic result is good.  The left breast is benign.  Both axillae are benign.   LAB RESULTS:  CMP     Component Value Date/Time   NA 140 08/16/2019 1051   NA 141 02/03/2017 0932   K 4.8 08/16/2019 1051   K 4.8 02/03/2017 0932   CL 103 08/16/2019 1051   CO2 27 08/16/2019 1051   CO2 27 02/03/2017 0932   GLUCOSE 89 08/16/2019 1051   GLUCOSE 89 02/03/2017 0932   BUN 19 08/16/2019 1051   BUN 17.7 02/03/2017 0932   CREATININE 1.22 (H) 08/16/2019 1051   CREATININE 1.3 (H) 02/03/2017 0932   CALCIUM 9.4 08/16/2019 1051   CALCIUM 9.4 02/03/2017 0932   PROT 6.7 08/16/2019 1051   PROT 6.5 02/03/2017 0932   ALBUMIN 3.5 08/16/2019 1051   ALBUMIN 3.4 (L) 02/03/2017 0932   AST 22 08/16/2019 1051   AST 19 02/03/2017 0932   ALT 18 08/16/2019 1051   ALT 12 02/03/2017 0932   ALKPHOS 70 08/16/2019 1051   ALKPHOS 42 02/03/2017 0932   BILITOT 0.5 08/16/2019 1051   BILITOT 0.37 02/03/2017 0932   GFRNONAA 42 (L) 08/16/2019 1051   GFRAA 49 (L) 08/16/2019 1051    INo results found for: SPEP, UPEP  Lab Results  Component Value Date   WBC 5.7 08/16/2019    NEUTROABS 3.1 08/16/2019   HGB 14.2 08/16/2019   HCT 44.1 08/16/2019   MCV 92.1 08/16/2019   PLT 241 08/16/2019      Chemistry  Component Value Date/Time   NA 140 08/16/2019 1051   NA 141 02/03/2017 0932   K 4.8 08/16/2019 1051   K 4.8 02/03/2017 0932   CL 103 08/16/2019 1051   CO2 27 08/16/2019 1051   CO2 27 02/03/2017 0932   BUN 19 08/16/2019 1051   BUN 17.7 02/03/2017 0932   CREATININE 1.22 (H) 08/16/2019 1051   CREATININE 1.3 (H) 02/03/2017 0932      Component Value Date/Time   CALCIUM 9.4 08/16/2019 1051   CALCIUM 9.4 02/03/2017 0932   ALKPHOS 70 08/16/2019 1051   ALKPHOS 42 02/03/2017 0932   AST 22 08/16/2019 1051   AST 19 02/03/2017 0932   ALT 18 08/16/2019 1051   ALT 12 02/03/2017 0932   BILITOT 0.5 08/16/2019 1051   BILITOT 0.37 02/03/2017 0932      No results found for: LABCA2  No components found for: LABCA125  No results for input(s): INR in the last 168 hours.  Urinalysis    Component Value Date/Time   COLORURINE YELLOW 01/04/2017 [place] 01/04/2017 1755   LABSPEC <1.005 (L) 01/04/2017 1755   PHURINE 6.0 01/04/2017 1755   GLUCOSEU NEGATIVE 01/04/2017 1755   HGBUR NEGATIVE 01/04/2017 1755   BILIRUBINUR NEGATIVE 01/04/2017 1755   KETONESUR NEGATIVE 01/04/2017 1755   PROTEINUR NEGATIVE 01/04/2017 1755   NITRITE NEGATIVE 01/04/2017 1755   LEUKOCYTESUR TRACE (A) 01/04/2017 1755    STUDIES: DG Chest 2 View  Result Date: 08/17/2019 CLINICAL DATA:  Dyspnea on exertion. EXAM: CHEST - 2 VIEW COMPARISON:  July 11, 2019. FINDINGS: The heart size and mediastinal contours are within normal limits. Both lungs are clear. No pneumothorax or pleural effusion is noted. The visualized skeletal structures are unremarkable. IMPRESSION: No active cardiopulmonary disease. Electronically Signed   By: Marijo Conception M.D.   On: 08/17/2019 09:37   NM Pulmonary Perfusion  Result Date: 08/17/2019 CLINICAL DATA:  Shortness of breath.  History of  breast carcinoma EXAM: NUCLEAR MEDICINE PERFUSION LUNG SCAN TECHNIQUE: Perfusion images were obtained in multiple projections after intravenous injection of radiopharmaceutical. Ventilation scans intentionally deferred if perfusion scan and chest x-ray adequate for interpretation during COVID 19 epidemic. Views: Anterior, posterior, left lateral, right lateral, RPO, LPO, RAO, LAO RADIOPHARMACEUTICALS:  1.6 mCi Tc-34mMAA IV COMPARISON:  Chest radiograph Aug 17, 2019 FINDINGS: Radiotracer uptake is homogeneous and symmetric bilaterally. No perfusion defects evident. IMPRESSION: No perfusion defects evident. Very low probability of pulmonary embolus. Electronically Signed   By: WLowella GripIII M.D.   On: 08/17/2019 11:21   ECHOCARDIOGRAM COMPLETE  Result Date: 07/30/2019    ECHOCARDIOGRAM REPORT   Patient Name:   GLAUREL HARNDENDate of Exam: 07/30/2019 Medical Rec #:  [id number]     Height:       65.0 in Accession #:    [id number]    Weight:       151.3 lb Date of Birth:  221-Aug-1942      BSA:          1.757 m Patient Age:    89years       BP:           146/82 mmHg Patient Gender: F              HR:           79 bpm. Exam Location:  [address] Procedure: 2D Echo, 3D Echo, Cardiac Doppler, Color Doppler and Strain Analysis Indications:  R06.02 Shortness of breath  History:        Patient has prior history of Echocardiogram examinations, most                 recent 04/13/2019. DVT., Signs/Symptoms:Shortness of Breath; Risk                 Factors:Hypertension and Dyslipidemia. Breast cancer. Pulmonary                 embolus. Edema.  Sonographer:    Jessee Avers, RDCS Referring Phys: [place]  1. Left ventricular ejection fraction, by estimation, is 55 to 60%. The left ventricle has normal function. The left ventricle has no regional wall motion abnormalities. Left ventricular diastolic parameters are consistent with Grade I diastolic dysfunction (impaired relaxation).  2. Right  ventricular systolic function is normal. The right ventricular size is normal. There is normal pulmonary artery systolic pressure. The estimated right ventricular systolic pressure is 54.0 mmHg.  3. The mitral valve is normal in structure. Trivial mitral valve regurgitation. No evidence of mitral stenosis.  4. The aortic valve is tricuspid. Aortic valve regurgitation is not visualized. Mild aortic valve sclerosis is present, with no evidence of aortic valve stenosis.  5. The inferior vena cava is normal in size with greater than 50% respiratory variability, suggesting right atrial pressure of 3 mmHg. Comparison(s): 04/13/19 EF 60-65%. PA pressure 34mHg. FINDINGS  Left Ventricle: Left ventricular ejection fraction, by estimation, is 55 to 60%. The left ventricle has normal function. The left ventricle has no regional wall motion abnormalities. The left ventricular internal cavity size was normal in size. There is  no left ventricular hypertrophy. Left ventricular diastolic parameters are consistent with Grade I diastolic dysfunction (impaired relaxation). Right Ventricle: The right ventricular size is normal. No increase in right ventricular wall thickness. Right ventricular systolic function is normal. There is normal pulmonary artery systolic pressure. The tricuspid regurgitant velocity is 2.03 m/s, and  with an assumed right atrial pressure of 3 mmHg, the estimated right ventricular systolic pressure is 198.1mmHg. Left Atrium: Left atrial size was normal in size. Right Atrium: Right atrial size was normal in size. Pericardium: Trivial pericardial effusion is present. Mitral Valve: The mitral valve is normal in structure. Trivial mitral valve regurgitation. No evidence of mitral valve stenosis. Tricuspid Valve: The tricuspid valve is normal in structure. Tricuspid valve regurgitation is trivial. Aortic Valve: The aortic valve is tricuspid. Aortic valve regurgitation is not visualized. Mild aortic valve sclerosis is  present, with no evidence of aortic valve stenosis. Pulmonic Valve: The pulmonic valve was normal in structure. Pulmonic valve regurgitation is not visualized. Aorta: The aortic root is normal in size and structure. Venous: The inferior vena cava is normal in size with greater than 50% respiratory variability, suggesting right atrial pressure of 3 mmHg. IAS/Shunts: No atrial level shunt detected by color flow Doppler.  LEFT VENTRICLE PLAX 2D LVIDd:         3.80 cm  Diastology LVIDs:         2.50 cm  LV e' lateral:   6.83 cm/s LV PW:         0.80 cm  LV E/e' lateral: 14.8 LV IVS:        0.80 cm  LV e' medial:    6.08 cm/s LVOT diam:     2.00 cm  LV E/e' medial:  16.6 LV SV:         67 LV  SV Index:   38       2D Longitudinal Strain LVOT Area:     3.14 cm 2D Strain GLS (A2C):   -20.3 %                         2D Strain GLS (A3C):   -21.9 %                         2D Strain GLS (A4C):   -24.1 %                         2D Strain GLS Avg:     -22.1 %                          3D Volume EF:                         3D EF:        58 %                         LV EDV:       94 ml                         LV ESV:       39 ml                         LV SV:        55 ml RIGHT VENTRICLE RV Basal diam:  3.40 cm RV S prime:     10.80 cm/s TAPSE (M-mode): 2.3 cm RVSP:           19.5 mmHg LEFT ATRIUM           Index       RIGHT ATRIUM           Index LA diam:      2.80 cm 1.59 cm/m  RA Pressure: 3.00 mmHg LA Vol (A2C): 28.6 ml 16.28 ml/m RA Area:     8.80 cm LA Vol (A4C): 16.3 ml 9.28 ml/m  RA Volume:   18.10 ml  10.30 ml/m  AORTIC VALVE LVOT Vmax:   96.10 cm/s LVOT Vmean:  59.700 cm/s LVOT VTI:    0.212 m  AORTA Ao Root diam: 3.30 cm Ao Asc diam:  2.80 cm MITRAL VALVE                TRICUSPID VALVE                             TR Peak grad:   16.5 mmHg                             TR Vmax:        203.00 cm/s MV E velocity: 101.00 cm/s  Estimated RAP:  3.00 mmHg MV A velocity: 102.00 cm/s  RVSP:           19.5 mmHg MV E/A ratio:   0.99                             SHUNTS  Systemic VTI:  0.21 m                             Systemic Diam: 2.00 cm Sarah Champagne MD Electronically signed by Sarah Champagne MD Signature Date/Time: 07/30/2019/2:40:10 PM    Final     ASSESSMENT: 79 y.o. BRCA negative [place] woman status post right breast upper outer quadrant biopsy 05/03/2014 for a clinical T1b N0, stage IA invasive ductal carcinoma, grade 1 or 2, estrogen receptor positive, progesterone receptor negative, with an MIB-1 of 12%, and HER-2/neu equivocal  (1) right lumpectomy and sentinel lymph node sampling 05/30/2014 showed a pT1c pN0, stage IA invasive ductal carcinoma, grade 1, repeat HER-2 again negative.  (2) genetics testing 05/17/2014 showed no deleterious mutations in ATM, BARD1, BRCA1, BRCA2, BRIP1, CDH1, CHEK2, MRE11A, MUTYH, NBN, NF1, PALB2, PTEN, RAD50, RAD51C, RAD51D, and TP53. Cephus Shelling genetics, BreastNext panel)  (3) Oncotype score of 25 predicts a risk of distant recurrence of 16% if the patient's only systemic treatment is tamoxifen for 5 years; it also predicts a benefit from chemotherapy in the 5% range. Given this marginal benefit the patient opted against adjuvant chemotherapy  (4) adjuvant radiation completed 08/12/2014: Right breast 4250 cGy in 17 sessions, right breast boost 1200 cGy in 6 sessions  (5) started tamoxifen 10/10/2014  (a) status post hysterectomy and bilateral salpingo-oophorectomy in 1972  (b) tamoxifen discontinued February 2021 with development of DVT/PE   (6) significant family breast cancer history, to be treated as if gene positive  (a) intensified screening started October 2020 with breast MRI.  (b) mammogram scheduled for May 2021  (7) bilateral segmental/subsegmental DVTs without heart strain 05/07/2019  (a) rivaroxaban started 05/07/2019  (b) bilateral lower extremity Dopplers 05/08/2019 shows left posterior tibial DVT  (c) hypercoagulable work-up per  Dr. Glori Bickers showed negative lupus anticoagulant, normal protein C activity, protein S antigen and antithrombin, negative prothrombin II mutation, negative factor V Leiden mutation and no anticardiolipin, phosphatidylserine or beta-2 glycoprotein antibodies.  Homocystine was elevated at 14.0  (d) CEA, CA-27 both WNL FEB 2021  (e) repeat CT angio 07/12/2019 and nuclear medicine perfusion lung scan 08/17/2019 showed resolution of the earlier clot, no perfusion defects.  (f) D-dimer 05/25/2019 and 08/16/2019 both normal at 0.27  (8) pulmonary hypertension? -- cardiac evaluation pending  PLAN: Sarah Young is now a little over 5 years out from definitive surgery for her breast cancer with no evidence of disease recurrence.  This is very favorable.  She has a significant family history of breast cancer and despite the negative genetic work-up she should undergo intensified screening.  She is a little behind on her mammography.  I have put that order in.  She will have a repeat breast MRI in October of this year  We reviewed her clotting situation.  She has done a great job at cleaning out the clots from her lung.  Nevertheless she continues to have symptoms of tightness in her chest even at rest with deep breathing.  She is having some orthopnea as well.  She may have developed pulmonary hypertension or there may be primary cardiac issue.  She has already been referred to cardiology and has a visit pending with Dr. Percival Spanish.  As far as anticoagulation is concerned we are going to continue a minimum of 3 more months and consider lifetime at that point  She knows to call for any other issue that may develop before then  Total encounter time 35  minutes.*  Jaqwan Wieber, Virgie Dad, MD  08/23/19 10:31 AM Medical Oncology and Hematology [facility] [place], [place] [address] Tel. [phone]    Fax. [phone]   I, Wilburn Mylar, am acting as scribe for Dr. Virgie Dad.  Taegan Standage.  I, Lurline Del MD, have reviewed the above documentation for accuracy and completeness, and I agree with the above.   *Total Encounter Time as defined by the Centers for Medicare and Medicaid Services includes, in addition to the face-to-face time of a patient visit (documented in the note above) non-face-to-face time: obtaining and reviewing outside history, ordering and reviewing medications, tests or procedures, care coordination (communications with other health care professionals or caregivers) and documentation in the medical record.

## 2019-08-22 NOTE — Telephone Encounter (Signed)
FINE

## 2019-08-23 ENCOUNTER — Ambulatory Visit: Payer: Medicare Other | Admitting: Cardiology

## 2019-08-23 ENCOUNTER — Other Ambulatory Visit: Payer: Self-pay

## 2019-08-23 ENCOUNTER — Inpatient Hospital Stay: Payer: Medicare Other | Admitting: Oncology

## 2019-08-23 VITALS — BP 122/59 | HR 77 | Temp 97.9°F | Resp 18 | Ht 65.0 in | Wt 152.4 lb

## 2019-08-23 DIAGNOSIS — Z7901 Long term (current) use of anticoagulants: Secondary | ICD-10-CM | POA: Diagnosis not present

## 2019-08-23 DIAGNOSIS — R0789 Other chest pain: Secondary | ICD-10-CM | POA: Diagnosis not present

## 2019-08-23 DIAGNOSIS — R0602 Shortness of breath: Secondary | ICD-10-CM | POA: Diagnosis not present

## 2019-08-23 DIAGNOSIS — C50411 Malignant neoplasm of upper-outer quadrant of right female breast: Secondary | ICD-10-CM

## 2019-08-23 DIAGNOSIS — I700 Atherosclerosis of aorta: Secondary | ICD-10-CM | POA: Diagnosis not present

## 2019-08-23 DIAGNOSIS — M8580 Other specified disorders of bone density and structure, unspecified site: Secondary | ICD-10-CM | POA: Diagnosis not present

## 2019-08-23 DIAGNOSIS — Z86711 Personal history of pulmonary embolism: Secondary | ICD-10-CM | POA: Diagnosis not present

## 2019-08-23 DIAGNOSIS — Z803 Family history of malignant neoplasm of breast: Secondary | ICD-10-CM | POA: Diagnosis not present

## 2019-08-23 DIAGNOSIS — Z923 Personal history of irradiation: Secondary | ICD-10-CM | POA: Diagnosis not present

## 2019-08-23 DIAGNOSIS — Z170 Estrogen receptor positive status [ER+]: Secondary | ICD-10-CM | POA: Diagnosis not present

## 2019-08-23 DIAGNOSIS — I82442 Acute embolism and thrombosis of left tibial vein: Secondary | ICD-10-CM | POA: Diagnosis not present

## 2019-08-23 DIAGNOSIS — C50412 Malignant neoplasm of upper-outer quadrant of left female breast: Secondary | ICD-10-CM | POA: Diagnosis not present

## 2019-08-23 MED ORDER — RIVAROXABAN 20 MG PO TABS: 20.0000 mg | ORAL_TABLET | Freq: Every day | ORAL | 5 refills | Status: DC

## 2019-08-24 ENCOUNTER — Telehealth: Payer: Self-pay | Admitting: Oncology

## 2019-08-24 NOTE — Telephone Encounter (Signed)
Cancelled June appts and scheduled appts per 5/27 los. Left voicemail with appt date and time.

## 2019-08-29 DIAGNOSIS — H35373 Puckering of macula, bilateral: Secondary | ICD-10-CM | POA: Diagnosis not present

## 2019-08-29 DIAGNOSIS — D3131 Benign neoplasm of right choroid: Secondary | ICD-10-CM | POA: Diagnosis not present

## 2019-08-29 DIAGNOSIS — H35363 Drusen (degenerative) of macula, bilateral: Secondary | ICD-10-CM | POA: Diagnosis not present

## 2019-08-29 DIAGNOSIS — H43393 Other vitreous opacities, bilateral: Secondary | ICD-10-CM | POA: Diagnosis not present

## 2019-08-29 DIAGNOSIS — H35013 Changes in retinal vascular appearance, bilateral: Secondary | ICD-10-CM | POA: Diagnosis not present

## 2019-08-29 DIAGNOSIS — H524 Presbyopia: Secondary | ICD-10-CM | POA: Diagnosis not present

## 2019-09-02 DIAGNOSIS — E785 Hyperlipidemia, unspecified: Secondary | ICD-10-CM | POA: Insufficient documentation

## 2019-09-02 NOTE — Progress Notes (Signed)
Cardiology Office Note   Date:  09/03/2019   ID:  Sarah Young, DOB [birth year], MRN [id number]  PCP:  Abner Greenspan, MD  Cardiologist:   No primary care provider on file.   No chief complaint on file.     History of Present Illness: Sarah Young is a 79 y.o. female who presents for evaluation of chest pain and SOB   She saw Dr. Ellyn Hack earlier this year for evaluation of chest pain.    Lexiscan Myoview was negative.  She has had two echoes this year without significant findings.  She has had breast cancer and she has had radiation therapy. She has seen pulmonary and they do not feel that this is related to radiation fibrosis. PFTs are pending. She was found to have pulmonary emboli in Feb on CT.  She had resolution of these on CT in April and a follow up   There were no perfusion studies on perfusion study last month.   She says that she feels discomfort in the right upper chest.  Skin about having a sharp discomfort.  Its not a limiting factor in her activities.  What really limits her is just being fatigued.  She does not have exercise tolerance.  She gets short of breath doing moderate activities around her house.  She has problems sleeping.  She does not feel rested when she wakes up necessarily.  She does have to sit up on some pillows because of just feeling uncomfortable.  She is not describing palpitations, presyncope or syncope.  Has had no weight gain or edema.  She has not had any jaw or arm pain.   Past Medical History:  Diagnosis Date  . Anxiety   . Arthritis    OA of hands  . Breast cancer of upper-outer quadrant of right female breast ([place]) 05/08/2014  . DVT (deep venous thrombosis) ([place]) 1980s  . Fibromyalgia   . Hyperlipidemia   . Hypertension   . Irritable bowel syndrome   . Migraine   . Osteopenia   . S/P radiation therapy 06/2014   Right breast 4250 cGy in 17 sessions, right breast boost 1200 cGy in 6 sessions= 07/11/2014 through  08/12/2014   . Vitamin D deficiency   . Wears glasses     Past Surgical History:  Procedure Laterality Date  . ABDOMINAL HYSTERECTOMY  1972  . APPENDECTOMY    . BREAST EXCISIONAL BIOPSY Left   . BREAST LUMPECTOMY Right 2016  . BREAST SURGERY  1995   lumpectomy fibrocystic breast-lt  . CATARACT EXTRACTION     both  . CHOLECYSTECTOMY  2005  . COLONOSCOPY    . ovarian cyst removed  1985  . RADIOACTIVE SEED GUIDED PARTIAL MASTECTOMY WITH AXILLARY SENTINEL LYMPH NODE BIOPSY Right 05/30/2014   Procedure: RADIOACTIVE SEED GUIDED PARTIAL MASTECTOMY WITH AXILLARY SENTINEL LYMPH NODE BIOPSY;  Surgeon: Autumn Messing III, MD;  Location: [facility];  Service: General;  Laterality: Right;     Current Outpatient Medications  Medication Sig Dispense Refill  . amitriptyline (ELAVIL) 10 MG tablet TAKE 2 TABLETS (20 MG TOTAL) BY MOUTH AT BEDTIME. 60 tablet 11  . amLODipine (NORVASC) 5 MG tablet TAKE 1 TABLET BY MOUTH DAILY. 90 tablet 2  . Ascorbic Acid (VITAMIN C) 100 MG tablet Take 100 mg by mouth daily.    . calcium carbonate 200 MG capsule Take 250 mg by mouth 2 (two) times daily.    . Cholecalciferol (VITAMIN D PO) Take  1 tablet by mouth 2 (two) times daily. Pt takes 2000 units daily    . rivaroxaban (XARELTO) 20 MG TABS tablet Take 1 tablet (20 mg total) by mouth daily with supper. 90 tablet 5  . Spacer/Aero-Holding Chambers (AEROCHAMBER MINI CHAMBER) DEVI For use with albuterol inhaler 1 each 0  . SUMAtriptan (IMITREX) 100 MG tablet TAKE 1 TABLET BY MOUTH AS NEEDED FOR MIGRAINE, MAY REPEAT IN 2 HOURS IF NEEDED. MAX 2 TABS IN 24 HOURS. 9 tablet 3   No current facility-administered medications for this visit.    Allergies:   Amoxicillin, Amoxicillin-pot clavulanate, Esomeprazole magnesium, Gabapentin, Omeprazole, and Ranitidine hcl    ROS:  Please see the history of present illness.   Otherwise, review of systems are positive for none.   All other systems are  reviewed and negative.    PHYSICAL EXAM: VS:  BP 126/74   Pulse 83   Temp (!) 97.5 F (36.4 C)   Ht 5\' 4"  (1.626 m)   Wt 136 lb 6.4 oz (61.9 kg)   SpO2 93%   BMI 23.41 kg/m  , BMI Body mass index is 23.41 kg/m. GENERAL:  Well appearing HEENT:  Pupils equal round and reactive, fundi not visualized, oral mucosa unremarkable NECK:  No jugular venous distention, waveform within normal limits, carotid upstroke brisk and symmetric, no bruits, no thyromegaly LYMPHATICS:  No cervical, inguinal adenopathy LUNGS:  Clear to auscultation bilaterally BACK:  No CVA tenderness CHEST:  Unremarkable HEART:  PMI not displaced or sustained,S1 and S2 within normal limits, no S3, no S4, no clicks, no rubs, no murmurs ABD:  Flat, positive bowel sounds normal in frequency in pitch, no bruits, no rebound, no guarding, no midline pulsatile mass, no hepatomegaly, no splenomegaly EXT:  2 plus pulses throughout, no edema, no cyanosis no clubbing SKIN:  No rashes no nodules NEURO:  Cranial nerves II through XII grossly intact, motor grossly intact throughout PSYCH:  Cognitively intact, oriented to person place and time    EKG:  EKG is ordered today. The ekg ordered today demonstrates sinus rhythm, rate 83, low voltage on the limb leads, poor anterior R wave progression, no acute ST-T wave changes.   Recent Labs: 08/16/2019: ALT 18; BUN 19; Creatinine, Ser 1.22; Hemoglobin 14.2; Platelets 241; Potassium 4.8; Sodium 140    Lipid Panel    Component Value Date/Time   CHOL 200 05/02/2018 0949   TRIG 115.0 05/02/2018 0949   HDL 63.20 05/02/2018 0949   CHOLHDL 3 05/02/2018 0949   VLDL 23.0 05/02/2018 0949   LDLCALC 114 (H) 05/02/2018 0949   LDLDIRECT 143.9 04/20/2013 1159      Wt Readings from Last 3 Encounters:  09/03/19 136 lb 6.4 oz (61.9 kg)  08/23/19 152 lb 6.4 oz (69.1 kg)  08/07/19 151 lb 6.4 oz (68.7 kg)      Other studies Reviewed: Additional studies/ records that were reviewed today  include: Extensive review (greater than 40 minutes with this appointment with more than half the time with the patient) of multiple records including oncology and pulmonary notes.  Reviewed results of perfusion studies and echocardiograms not ordered by cardiology.. Review of the above records demonstrates:  Please see elsewhere in the note.     ASSESSMENT AND PLAN:  DYSPNEA:   She has had an extensive work up without clear etiology.  Right heart cath is indicated.  However, she is very tired of having test.  Think is reasonable to wait for her to have the pulmonary  function testing first.  I do think that given her symptoms we should perform a right heart cath if there is nothing revealing on this.  She might agree to this but I will see her back in July to discuss.  In the meantime I did discuss with her the need to start her on pulmonary rehab with 15 minutes twice a day of walking or pedaling a stationary bicycle.  DYSLIPIDEMIA: LDL was 114.  HDL 63.  In the absence of any obstructive coronary disease no change in therapy although I will discuss with her statins in the future given the fact that she has atherosclerosis noted in her aorta.  DVT:   She will likely be on lifelong anticoagulation.  This has been discussed with her with oncology as well.  HTN: Blood pressures well controlled.  She will continue the meds as listed.  COVID EDUCATION: She has had the vaccine.   Current medicines are reviewed at length with the patient today.  The patient does not have concerns regarding medicines.  The following changes have been made:  no change  Labs/ tests ordered today include: none No orders of the defined types were placed in this encounter.    Disposition:   FU with me in July after he pulmonary appt.     Signed, Minus Breeding, MD  09/03/2019 9:53 AM    [facility]

## 2019-09-03 ENCOUNTER — Ambulatory Visit: Payer: Medicare Other | Admitting: Cardiology

## 2019-09-03 ENCOUNTER — Encounter: Payer: Self-pay | Admitting: Cardiology

## 2019-09-03 ENCOUNTER — Other Ambulatory Visit: Payer: Self-pay

## 2019-09-03 VITALS — BP 126/74 | HR 83 | Temp 97.5°F | Ht 64.0 in | Wt 136.4 lb

## 2019-09-03 DIAGNOSIS — R0600 Dyspnea, unspecified: Secondary | ICD-10-CM

## 2019-09-03 DIAGNOSIS — E785 Hyperlipidemia, unspecified: Secondary | ICD-10-CM | POA: Diagnosis not present

## 2019-09-03 DIAGNOSIS — I10 Essential (primary) hypertension: Secondary | ICD-10-CM

## 2019-09-03 DIAGNOSIS — R0609 Other forms of dyspnea: Secondary | ICD-10-CM

## 2019-09-03 NOTE — Patient Instructions (Signed)
Medication Instructions:  NO CHANGES *If you need a refill on your cardiac medications before your next appointment, please call your pharmacy*  Lab Work: NONE ORDERED THIS VISIT  Testing/Procedures: NONE ORDERED THIS VISIT  Follow-Up: At [facility], you and your health needs are our priority.  As part of our continuing mission to provide you with exceptional heart care, we have created designated Provider Care Teams.  These Care Teams include your primary Cardiologist (physician) and Advanced Practice Providers (APPs -  Physician Assistants and Nurse Practitioners) who all work together to provide you with the care you need, when you need it.  Your next appointment:   Friday July 9th at 09:00am  The format for your next appointment:   In Person  Provider:   Minus Breeding, MD

## 2019-09-11 ENCOUNTER — Ambulatory Visit: Payer: Medicare Other | Admitting: Oncology

## 2019-09-11 ENCOUNTER — Other Ambulatory Visit: Payer: Medicare Other

## 2019-09-18 ENCOUNTER — Other Ambulatory Visit: Payer: Self-pay

## 2019-09-18 ENCOUNTER — Ambulatory Visit
Admission: RE | Admit: 2019-09-18 | Discharge: 2019-09-18 | Disposition: A | Discharge status: Home / Self Care | Payer: Medicare Other | Source: Ambulatory Visit | Attending: Oncology | Admitting: Oncology

## 2019-09-18 DIAGNOSIS — R928 Other abnormal and inconclusive findings on diagnostic imaging of breast: Secondary | ICD-10-CM | POA: Diagnosis not present

## 2019-09-18 DIAGNOSIS — M8580 Other specified disorders of bone density and structure, unspecified site: Secondary | ICD-10-CM

## 2019-09-18 DIAGNOSIS — Z170 Estrogen receptor positive status [ER+]: Secondary | ICD-10-CM

## 2019-09-18 DIAGNOSIS — I700 Atherosclerosis of aorta: Secondary | ICD-10-CM

## 2019-09-18 HISTORY — DX: Personal history of irradiation: Z92.3

## 2019-09-27 ENCOUNTER — Ambulatory Visit: Payer: Medicare Other | Admitting: Critical Care Medicine

## 2019-10-05 ENCOUNTER — Ambulatory Visit: Payer: Medicare Other | Admitting: Cardiology

## 2019-11-14 DIAGNOSIS — L570 Actinic keratosis: Secondary | ICD-10-CM | POA: Diagnosis not present

## 2019-11-14 DIAGNOSIS — L814 Other melanin hyperpigmentation: Secondary | ICD-10-CM | POA: Diagnosis not present

## 2019-11-14 DIAGNOSIS — D225 Melanocytic nevi of trunk: Secondary | ICD-10-CM | POA: Diagnosis not present

## 2019-11-14 DIAGNOSIS — L821 Other seborrheic keratosis: Secondary | ICD-10-CM | POA: Diagnosis not present

## 2019-11-14 DIAGNOSIS — D1801 Hemangioma of skin and subcutaneous tissue: Secondary | ICD-10-CM | POA: Diagnosis not present

## 2019-11-19 ENCOUNTER — Other Ambulatory Visit: Payer: Self-pay

## 2019-11-19 ENCOUNTER — Encounter: Payer: Self-pay | Admitting: Family Medicine

## 2019-11-19 ENCOUNTER — Ambulatory Visit (INDEPENDENT_AMBULATORY_CARE_PROVIDER_SITE_OTHER): Payer: Medicare Other | Admitting: Family Medicine

## 2019-11-19 VITALS — BP 128/68 | HR 78 | Temp 97.1°F | Ht 64.0 in | Wt 150.0 lb

## 2019-11-19 DIAGNOSIS — T63461A Toxic effect of venom of wasps, accidental (unintentional), initial encounter: Secondary | ICD-10-CM | POA: Diagnosis not present

## 2019-11-19 DIAGNOSIS — D6859 Other primary thrombophilia: Secondary | ICD-10-CM

## 2019-11-19 MED ORDER — TRIAMCINOLONE ACETONIDE 0.1 % EX CREA
1.0000 | TOPICAL_CREAM | Freq: Two times a day (BID) | CUTANEOUS | 0 refills | Status: DC
Start: 2019-11-19 — End: 2021-12-08

## 2019-11-19 MED ORDER — AZITHROMYCIN 250 MG PO TABS: ORAL_TABLET | ORAL | 0 refills | Status: DC

## 2019-11-19 NOTE — Assessment & Plan Note (Signed)
Pt continues xarelto

## 2019-11-19 NOTE — Progress Notes (Signed)
Subjective:    Patient ID: Sarah Young, female    DOB: [birth year], 79 y.o.   MRN: [id number]  This visit occurred during the SARS-CoV-2 public health emergency.  Safety protocols were in place, including screening questions prior to the visit, additional usage of staff PPE, and extensive cleaning of exam room while observing appropriate contact time as indicated for disinfecting solutions.    HPI Pt presents with a bee sting on L leg   Saturday afternoon (had a wasp nest she sprayed the week before on porch)  Took a wreath off door to clean it  Then she felt something hit her leg  Very red and swollen  Hurts and itches   She saw a striped wasp (she killed it) No stinger remained   No hives No swelling in mouth/throat  No shortness of breath   Does have someone to clear the area    Has a site near it that was recently frozen/treated -healing    Lost her SIL recent (unexpected) -cardiac  It has been very hard   Patient Active Problem List   Diagnosis Date Noted  . Wasp sting 11/19/2019  . Dyslipidemia 09/02/2019  . Aortic atherosclerosis ([place]) 08/23/2019  . Dizziness 07/11/2019  . Deep vein thrombosis (DVT) of left lower extremity ([place]) 05/25/2019  . Hypercoagulopathy ([place]) 05/22/2019  . History of pulmonary embolism 05/07/2019  . Anterior chest wall pain 04/06/2019  . Chest pain of unknown etiology -low risk for Cardiac Etiology 04/06/2019  . SOB (shortness of breath) on exertion 04/06/2019  . Osteopenia 05/02/2018  . Routine general medical examination at a health care facility 11/21/2016  . Estrogen deficiency 11/19/2016  . Renal insufficiency 11/19/2016  . Pedal edema 12/15/2015  . Family history of breast cancer in female 05/17/2014  . Malignant neoplasm of upper-outer quadrant of right breast in female, estrogen receptor positive ([place]) 05/08/2014  . GERD (gastroesophageal reflux disease) 03/01/2014  . Lumbar degenerative disc disease 08/15/2013  .  Chronic pain of scapula 08/15/2013  . Essential hypertension 10/02/2008  . Migraine 09/06/2008  . Vitamin D deficiency 06/27/2007  . HYPERLIPIDEMIA 05/11/2007  . Generalized anxiety disorder 05/11/2007  . Disorder of bone and cartilage 05/11/2007  . H/O poliomyelitis 05/08/2007  . OSTEOARTHRITIS, HANDS, BILATERAL 05/08/2007  . Fibromyalgia 05/08/2007  . URINARY INCONTINENCE, STRESS, MILD 05/08/2007   Past Medical History:  Diagnosis Date  . Anxiety   . Arthritis    OA of hands  . Breast cancer of upper-outer quadrant of right female breast ([place]) 05/08/2014  . DVT (deep venous thrombosis) ([place]) 1980s  . Fibromyalgia   . Hyperlipidemia   . Hypertension   . Irritable bowel syndrome   . Migraine   . Osteopenia   . Personal history of radiation therapy   . S/P radiation therapy 06/2014   Right breast 4250 cGy in 17 sessions, right breast boost 1200 cGy in 6 sessions= 07/11/2014 through 08/12/2014   . Vitamin D deficiency   . Wears glasses    Past Surgical History:  Procedure Laterality Date  . ABDOMINAL HYSTERECTOMY  1972  . APPENDECTOMY    . BREAST EXCISIONAL BIOPSY Left   . BREAST LUMPECTOMY Right 2016  . BREAST SURGERY  1995   lumpectomy fibrocystic breast-lt  . CATARACT EXTRACTION     both  . CHOLECYSTECTOMY  2005  . COLONOSCOPY    . ovarian cyst removed  1985  . RADIOACTIVE SEED GUIDED PARTIAL MASTECTOMY WITH AXILLARY SENTINEL LYMPH NODE BIOPSY Right 05/30/2014  Procedure: RADIOACTIVE SEED GUIDED PARTIAL MASTECTOMY WITH AXILLARY SENTINEL LYMPH NODE BIOPSY;  Surgeon: Autumn Messing III, MD;  Location: [facility];  Service: General;  Laterality: Right;   Social History   Tobacco Use  . Smoking status: Never Smoker  . Smokeless tobacco: Never Used  . Tobacco comment: non smoker  Vaping Use  . Vaping Use: Never used  Substance Use Topics  . Alcohol use: No    Alcohol/week: 0.0 standard drinks  . Drug use: No   Family History    Problem Relation Age of Onset  . Hypertension Father   . Cancer Father 39       esophageal - smoker  . Leukemia Mother   . Cancer Mother 52       leukemia  . Cancer Brother 88       breast CA  . Breast cancer Brother 104  . Cancer Maternal Aunt 80       leukemia  . Cancer Paternal Aunt 45       breast  . Breast cancer Paternal Aunt 55  . Breast cancer Maternal Grandmother 80  . Breast cancer Sister 58   Allergies  Allergen Reactions  . Amoxicillin Itching    REACTION: reacts opposite effect made infection worse.  Marland Kitchen Amoxicillin-Pot Clavulanate     REACTION: Felt like body turned inside out  . Esomeprazole Magnesium     REACTION: Nausea  . Gabapentin   . Omeprazole Nausea And Vomiting    REACTION: not effective  . Ranitidine Hcl     REACTION: not effective   Current Outpatient Medications on File Prior to Visit  Medication Sig Dispense Refill  . amitriptyline (ELAVIL) 10 MG tablet TAKE 2 TABLETS (20 MG TOTAL) BY MOUTH AT BEDTIME. 60 tablet 11  . amLODipine (NORVASC) 5 MG tablet TAKE 1 TABLET BY MOUTH DAILY. 90 tablet 2  . Ascorbic Acid (VITAMIN C) 100 MG tablet Take 100 mg by mouth daily.    . calcium carbonate 200 MG capsule Take 250 mg by mouth 2 (two) times daily.    . Cholecalciferol (VITAMIN D PO) Take 1 tablet by mouth 2 (two) times daily. Pt takes 2000 units daily    . rivaroxaban (XARELTO) 20 MG TABS tablet Take 1 tablet (20 mg total) by mouth daily with supper. 90 tablet 5  . SUMAtriptan (IMITREX) 100 MG tablet TAKE 1 TABLET BY MOUTH AS NEEDED FOR MIGRAINE, MAY REPEAT IN 2 HOURS IF NEEDED. MAX 2 TABS IN 24 HOURS. 9 tablet 3   No current facility-administered medications on file prior to visit.     Review of Systems  Constitutional: Negative for activity change, appetite change, fatigue, fever and unexpected weight change.  HENT: Negative for congestion, ear pain, facial swelling, rhinorrhea, sinus pressure, sore throat and trouble swallowing.   Eyes: Negative  for pain, redness and visual disturbance.  Respiratory: Negative for cough, shortness of breath and wheezing.   Cardiovascular: Negative for chest pain and palpitations.  Gastrointestinal: Negative for abdominal pain, blood in stool, constipation and diarrhea.  Endocrine: Negative for polydipsia and polyuria.  Genitourinary: Negative for dysuria, frequency and urgency.  Musculoskeletal: Negative for arthralgias, back pain and myalgias.  Skin: Positive for color change and wound. Negative for pallor and rash.  Allergic/Immunologic: Negative for environmental allergies.  Neurological: Negative for dizziness, syncope and headaches.  Hematological: Negative for adenopathy. Does not bruise/bleed easily.  Psychiatric/Behavioral: Negative for decreased concentration and dysphoric mood. The patient is not nervous/anxious.  Objective:   Physical Exam Constitutional:      General: She is not in acute distress.    Appearance: Normal appearance. She is normal weight. She is not ill-appearing.  HENT:     Head: Normocephalic and atraumatic.     Mouth/Throat:     Mouth: Mucous membranes are moist.     Pharynx: Oropharynx is clear.     Comments: No mouth or throat swelling  Eyes:     Conjunctiva/sclera: Conjunctivae normal.     Pupils: Pupils are equal, round, and reactive to light.  Cardiovascular:     Rate and Rhythm: Normal rate and regular rhythm.  Pulmonary:     Effort: Pulmonary effort is normal. No respiratory distress.     Breath sounds: Normal breath sounds. No stridor. No wheezing or rales.  Musculoskeletal:     Cervical back: Neck supple.     Right lower leg: No edema.     Left lower leg: Edema present.  Lymphadenopathy:     Cervical: No cervical adenopathy.  Skin:    General: Skin is warm and dry.     Findings: Erythema present.     Comments: Insect sting on lower LLE laterally  Bright erythema with a few petichae surrounding (3-4 cm) and some going down toward ankle    No stinger present No drainage /pustule or vesicle  No excoriation' Mildly tender  Leg down to ankle-diffuse swelling  No evidence of urticaria  Neurological:     Mental Status: She is alert.     Sensory: No sensory deficit.     Coordination: Coordination normal.  Psychiatric:        Mood and Affect: Mood normal.     Comments: Sad when discussing loss of SIL (not tearful)            Assessment & Plan:   Problem List Items Addressed This Visit      Other   Wasp sting    L lower leg with local reaction  Cannot r/o cellulitis due to intense redness and tenderness Px zpak For inflammation px triamcinolone cream bid prn  Elevate and ice  Clean with soap and water Update if not starting to improve in a week or if worsening

## 2019-11-19 NOTE — Patient Instructions (Signed)
Take the zpak as directed (to cover skin infection)  Use the triamcinolone cream twice daily until the site improves   Keep clean with soap and water  Elevate foot/leg whenever you can  Use a cold compress for itching/swelling and pain

## 2019-11-19 NOTE — Assessment & Plan Note (Signed)
L lower leg with local reaction  Cannot r/o cellulitis due to intense redness and tenderness Px zpak For inflammation px triamcinolone cream bid prn  Elevate and ice  Clean with soap and water Update if not starting to improve in a week or if worsening

## 2019-11-22 ENCOUNTER — Other Ambulatory Visit: Payer: Self-pay

## 2019-11-22 ENCOUNTER — Inpatient Hospital Stay: Payer: Medicare Other | Attending: Oncology

## 2019-11-22 DIAGNOSIS — Z86718 Personal history of other venous thrombosis and embolism: Secondary | ICD-10-CM | POA: Insufficient documentation

## 2019-11-22 DIAGNOSIS — Z170 Estrogen receptor positive status [ER+]: Secondary | ICD-10-CM

## 2019-11-22 DIAGNOSIS — I2699 Other pulmonary embolism without acute cor pulmonale: Secondary | ICD-10-CM | POA: Diagnosis not present

## 2019-11-22 DIAGNOSIS — Z853 Personal history of malignant neoplasm of breast: Secondary | ICD-10-CM | POA: Diagnosis not present

## 2019-11-22 DIAGNOSIS — Z923 Personal history of irradiation: Secondary | ICD-10-CM | POA: Diagnosis not present

## 2019-11-22 DIAGNOSIS — Z7901 Long term (current) use of anticoagulants: Secondary | ICD-10-CM | POA: Diagnosis not present

## 2019-11-22 LAB — CBC WITH DIFFERENTIAL/PLATELET
Abs Immature Granulocytes: 0.01 10*3/uL (ref 0.00–0.07)
Basophils Absolute: 0.1 10*3/uL (ref 0.0–0.1)
Basophils Relative: 1 %
Eosinophils Absolute: 0.2 10*3/uL (ref 0.0–0.5)
Eosinophils Relative: 3 %
HCT: 43.1 % (ref 36.0–46.0)
Hemoglobin: 13.8 g/dL (ref 12.0–15.0)
Immature Granulocytes: 0 %
Lymphocytes Relative: 28 %
Lymphs Abs: 1.8 10*3/uL (ref 0.7–4.0)
MCH: 29.1 pg (ref 26.0–34.0)
MCHC: 32 g/dL (ref 30.0–36.0)
MCV: 90.9 fL (ref 80.0–100.0)
Monocytes Absolute: 0.5 10*3/uL (ref 0.1–1.0)
Monocytes Relative: 8 %
Neutro Abs: 3.7 10*3/uL (ref 1.7–7.7)
Neutrophils Relative %: 60 %
Platelets: 255 10*3/uL (ref 150–400)
RBC: 4.74 MIL/uL (ref 3.87–5.11)
RDW: 14.8 % (ref 11.5–15.5)
WBC: 6.3 10*3/uL (ref 4.0–10.5)
nRBC: 0 % (ref 0.0–0.2)

## 2019-11-22 LAB — COMPREHENSIVE METABOLIC PANEL
ALT: 24 U/L (ref 0–44)
AST: 24 U/L (ref 15–41)
Albumin: 3.5 g/dL (ref 3.5–5.0)
Alkaline Phosphatase: 77 U/L (ref 38–126)
Anion gap: 7 (ref 5–15)
BUN: 20 mg/dL (ref 8–23)
CO2: 30 mmol/L (ref 22–32)
Calcium: 10.5 mg/dL — ABNORMAL HIGH (ref 8.9–10.3)
Chloride: 104 mmol/L (ref 98–111)
Creatinine, Ser: 1.28 mg/dL — ABNORMAL HIGH (ref 0.44–1.00)
GFR calc Af Amer: 46 mL/min — ABNORMAL LOW (ref >60)
GFR calc non Af Amer: 40 mL/min — ABNORMAL LOW (ref >60)
Glucose, Bld: 93 mg/dL (ref 70–99)
Potassium: 5.4 mmol/L — ABNORMAL HIGH (ref 3.5–5.1)
Sodium: 141 mmol/L (ref 135–145)
Total Bilirubin: 0.6 mg/dL (ref 0.3–1.2)
Total Protein: 6.7 g/dL (ref 6.5–8.1)

## 2019-11-25 NOTE — Progress Notes (Signed)
[name]  [phone] [phone]     ID: Sarah Young DOB: 1940/09/10  MR#: [id number]  NAT#:[id number]  Patient Care Team: Abner Greenspan, MD as PCP - General Jovita Kussmaul, MD as Consulting Physician (General Surgery) Waldemar Siegel, Virgie Dad, MD as Consulting Physician (Oncology) Julian Hy, DO as Consulting Physician (Pulmonary Disease) Minus Breeding, MD as Consulting Physician (Cardiology)OTHER MD: Nena Polio M.D.  CHIEF COMPLAINT: Estrogen receptor positive breast cancer  CURRENT TREATMENT: rivaroxaban   INTERVAL HISTORY: [name] returns today for follow-up of her estrogen receptor positive breast cancer.  She is now under observation alone as far as her breast cancer is concerned.  Since her last visit, she underwent bilateral diagnostic mammography with tomography at [facility] on 09/18/2019 showing: breast density category B; no evidence of malignancy in either breast.  She met with Dr. Percival Spanish and greatly appreciated his interventions.  He set her up for physical therapy and she feels that has helped.  She still has that strange chest pressure--she points to the middle of her chest--but no cough, no worsening shortness of breath, and no other symptoms suggestive of progressive problems.  She is tolerating the rivaroxaban well except for minimal epistaxis occasionally and increasing cost   REVIEW OF SYSTEMS: Lianette had a bee or wasp sting in her left lower leg which apparently got inflamed.  She was treated with antibiotics for that.  Her son-in-law administer priest a sermon gave communion walked home 8 took a nap and never woke up.  This is causing of course a great deal of family stress and blindness daughter is really now dependent on her.  [name] does feel this is one big reason God has kept her in this world.  Aside from these issues a detailed review of systems today was stable   BREAST CANCER HISTORY: From the  original intake note:  [name] had routine bilateral screening [facility]. This found the breast density to be category C. A possible mass was noted in the right breast, and on 05/03/2014 she underwent right diagnostic mammography with ultrasonography. There was a spiculated mass in the upper outer quadrant of the right breast which was new since the prior mammogram. On physical exam there was mild nodularity noted in the area in question. Ultrasound confirmed a hypoechoic irregular mass measuring 0.9 cm in the upper outer quadrant.  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  MRI of the breasts is scheduled for 05/20/2014. The patient's subsequent history is as detailed below   PAST MEDICAL HISTORY: Past Medical History:  Diagnosis Date  . Anxiety   . Arthritis    OA of hands  . Breast cancer of upper-outer quadrant of right female breast ([place]) 05/08/2014  . DVT (deep venous thrombosis) ([place]) 1980s  . Fibromyalgia   . Hyperlipidemia   . Hypertension   . Irritable bowel syndrome   . Migraine   . Osteopenia   . Personal history of radiation therapy   . S/P radiation therapy 06/2014   Right breast 4250 cGy in 17 sessions, right breast boost 1200 cGy in 6 sessions= 07/11/2014 through 08/12/2014   . Vitamin D deficiency   .  Wears glasses     PAST SURGICAL HISTORY: Past Surgical History:  Procedure Laterality Date  . ABDOMINAL HYSTERECTOMY  1972  . APPENDECTOMY    . BREAST EXCISIONAL BIOPSY Left   . BREAST LUMPECTOMY Right 2016  . BREAST SURGERY  1995   lumpectomy fibrocystic breast-lt  . CATARACT EXTRACTION     both  .  CHOLECYSTECTOMY  2005  . COLONOSCOPY    . ovarian cyst removed  1985  . RADIOACTIVE SEED GUIDED PARTIAL MASTECTOMY WITH AXILLARY SENTINEL LYMPH NODE BIOPSY Right 05/30/2014   Procedure: RADIOACTIVE SEED GUIDED PARTIAL MASTECTOMY WITH AXILLARY SENTINEL LYMPH NODE BIOPSY;  Surgeon: Autumn Messing III, MD;  Location: [facility];  Service: General;  Laterality: Right;    FAMILY HISTORY Family History  Problem Relation Age of Onset  . Hypertension Father   . Cancer Father 88       esophageal - smoker  . Leukemia Mother   . Cancer Mother 95       leukemia  . Cancer Brother 23       breast CA  . Breast cancer Brother 16  . Cancer Maternal Aunt 80       leukemia  . Cancer Paternal Aunt 3       breast  . Breast cancer Paternal Aunt 81  . Breast cancer Maternal Grandmother 80  . Breast cancer Sister 50   the patient's father died at the age of 73 with gastric cancer. The patient's mother was diagnosed with chronic leukemia at age 26 and died from complications of dementia at age 94 the patient has one brother, diagnosed with breast cancer at the age of 34. She has 2 sisters. There is no other history of breast or ovarian cancer in the family.   GYNECOLOGIC HISTORY:  No LMP recorded. Patient has had a hysterectomy. Menarche age 3, first live birth age 56. The patient is GX P2. She underwent total abdominal hysterectomy with bilateral salpingo-oophorectomy 1972. She used hormone replacement for approximately 38 years, until 2011.   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 Her husband Jori Moll is currently not active in the business. Daughter [name], lives in [place], is Pres. The other daughter, [name], lives in [place] where she works as a Landscape architect. The patient has 3 grandchildren. She attends a local [employer] DIRECTIVES: The patient has named both her husband  Jori Moll and her daughter Suanne Marker as Equities trader powers of attorney.   HEALTH MAINTENANCE: Social History   Tobacco Use  . Smoking status: Never Smoker  . Smokeless tobacco: Never Used  . Tobacco comment: non smoker  Vaping Use  . Vaping Use: Never used  Substance Use Topics  . Alcohol use: No    Alcohol/week: 0.0 standard drinks  . Drug use: No     Colonoscopy:  PAP:  Bone density:  Lipid panel:  Allergies  Allergen Reactions  . Amoxicillin Itching    REACTION: reacts opposite effect made infection worse.  Marland Kitchen Amoxicillin-Pot Clavulanate     REACTION: Felt like body turned inside out  . Esomeprazole Magnesium     REACTION: Nausea  . Gabapentin   . Omeprazole Nausea And Vomiting    REACTION: not effective  . Ranitidine Hcl     REACTION: not effective    Current Outpatient Medications  Medication Sig Dispense Refill  . amitriptyline (ELAVIL) 10 MG tablet TAKE 2  TABLETS (20 MG TOTAL) BY MOUTH AT BEDTIME. 60 tablet 11  . amLODipine (NORVASC) 5 MG tablet TAKE 1 TABLET BY MOUTH DAILY. 90 tablet 2  . Ascorbic Acid (VITAMIN C) 100 MG tablet Take 100 mg by mouth daily.    Marland Kitchen azithromycin (ZITHROMAX Z-PAK) 250 MG tablet Take 2 pills by mouth today and then 1 pill daily for 4 days 6 tablet 0  . calcium carbonate 200 MG capsule Take 250 mg by mouth 2 (two) times daily.    . Cholecalciferol (VITAMIN D PO) Take 1 tablet by mouth 2 (two) times daily. Pt takes 2000 units daily    . rivaroxaban (XARELTO) 20 MG TABS tablet Take 1 tablet (20 mg total) by mouth daily with supper. 90 tablet 5  . SUMAtriptan (IMITREX) 100 MG tablet TAKE 1 TABLET BY MOUTH AS NEEDED FOR MIGRAINE, MAY REPEAT IN 2 HOURS IF NEEDED. MAX 2 TABS IN 24 HOURS. 9 tablet 3  . triamcinolone cream (KENALOG) 0.1 % Apply 1 application topically 2 (two) times daily. To affected area (insect sting) 15 g 0   No current facility-administered medications for this visit.    OBJECTIVE: white woman who appears stated  age  65:   11/26/19 1057  BP: 129/67  Pulse: 75  Resp: 17  Temp: (!) 96.5 F (35.8 C)  SpO2: 100%   Wt Readings from Last 3 Encounters:  11/26/19 152 lb 8 oz (69.2 kg)  11/19/19 150 lb (68 kg)  09/03/19 136 lb 6.4 oz (61.9 kg)   Body mass index is 29.78 kg/m.    ECOG FS:1 - Symptomatic but completely ambulatory  Sclerae unicteric, EOMs intact Wearing a mask No cervical or supraclavicular adenopathy Lungs no rales or rhonchi Heart regular rate and rhythm Abd soft, nontender, positive bowel sounds MSK no focal spinal tenderness, no upper extremity lymphedema Neuro: nonfocal, well oriented, appropriate affect Breasts: The right breast is status post lumpectomy and radiation with no evidence of local recurrence.  The left breast is unremarkable.  Both axillae are benign.   LAB RESULTS:  CMP     Component Value Date/Time   NA 141 11/22/2019 1058   NA 141 02/03/2017 0932   K 5.4 (H) 11/22/2019 1058   K 4.8 02/03/2017 0932   CL 104 11/22/2019 1058   CO2 30 11/22/2019 1058   CO2 27 02/03/2017 0932   GLUCOSE 93 11/22/2019 1058   GLUCOSE 89 02/03/2017 0932   BUN 20 11/22/2019 1058   BUN 17.7 02/03/2017 0932   CREATININE 1.28 (H) 11/22/2019 1058   CREATININE 1.3 (H) 02/03/2017 0932   CALCIUM 10.5 (H) 11/22/2019 1058   CALCIUM 9.4 02/03/2017 0932   PROT 6.7 11/22/2019 1058   PROT 6.5 02/03/2017 0932   ALBUMIN 3.5 11/22/2019 1058   ALBUMIN 3.4 (L) 02/03/2017 0932   AST 24 11/22/2019 1058   AST 19 02/03/2017 0932   ALT 24 11/22/2019 1058   ALT 12 02/03/2017 0932   ALKPHOS 77 11/22/2019 1058   ALKPHOS 42 02/03/2017 0932   BILITOT 0.6 11/22/2019 1058   BILITOT 0.37 02/03/2017 0932   GFRNONAA 40 (L) 11/22/2019 1058   GFRAA 46 (L) 11/22/2019 1058    INo results found for: SPEP, UPEP  Lab Results  Component Value Date   WBC 6.3 11/22/2019   NEUTROABS 3.7 11/22/2019   HGB 13.8 11/22/2019   HCT 43.1 11/22/2019   MCV 90.9 11/22/2019   PLT 255 11/22/2019       Chemistry  Component Value Date/Time   NA 141 11/22/2019 1058   NA 141 02/03/2017 0932   K 5.4 (H) 11/22/2019 1058   K 4.8 02/03/2017 0932   CL 104 11/22/2019 1058   CO2 30 11/22/2019 1058   CO2 27 02/03/2017 0932   BUN 20 11/22/2019 1058   BUN 17.7 02/03/2017 0932   CREATININE 1.28 (H) 11/22/2019 1058   CREATININE 1.3 (H) 02/03/2017 0932      Component Value Date/Time   CALCIUM 10.5 (H) 11/22/2019 1058   CALCIUM 9.4 02/03/2017 0932   ALKPHOS 77 11/22/2019 1058   ALKPHOS 42 02/03/2017 0932   AST 24 11/22/2019 1058   AST 19 02/03/2017 0932   ALT 24 11/22/2019 1058   ALT 12 02/03/2017 0932   BILITOT 0.6 11/22/2019 1058   BILITOT 0.37 02/03/2017 0932      No results found for: LABCA2  No components found for: LABCA125  No results for input(s): INR in the last 168 hours.  Urinalysis    Component Value Date/Time   COLORURINE YELLOW 01/04/2017 [place] 01/04/2017 1755   LABSPEC <1.005 (L) 01/04/2017 1755   PHURINE 6.0 01/04/2017 1755   GLUCOSEU NEGATIVE 01/04/2017 1755   HGBUR NEGATIVE 01/04/2017 [place] 01/04/2017 1755   KETONESUR NEGATIVE 01/04/2017 1755   PROTEINUR NEGATIVE 01/04/2017 1755   NITRITE NEGATIVE 01/04/2017 1755   LEUKOCYTESUR TRACE (A) 01/04/2017 1755    STUDIES: No results found.    ASSESSMENT: 79 y.o. BRCA negative [place] woman status post right breast upper outer quadrant biopsy 05/03/2014 for a clinical T1b N0, stage IA invasive ductal carcinoma, grade 1 or 2, estrogen receptor positive, progesterone receptor negative, with an MIB-1 of 12%, and HER-2/neu equivocal  (1) right lumpectomy and sentinel lymph node sampling 05/30/2014 showed a pT1c pN0, stage IA invasive ductal carcinoma, grade 1, repeat HER-2 again negative.  (2) genetics testing 05/17/2014 showed no deleterious mutations in ATM, BARD1, BRCA1, BRCA2, BRIP1, CDH1, CHEK2, MRE11A, MUTYH, NBN, NF1, PALB2, PTEN, RAD50, RAD51C, RAD51D,  and TP53. Cephus Shelling genetics, BreastNext panel)  (3) Oncotype score of 25 predicts a risk of distant recurrence of 16% if the patient's only systemic treatment is tamoxifen for 5 years; it also predicts a benefit from chemotherapy in the 5% range. Given this marginal benefit the patient opted against adjuvant chemotherapy  (4) adjuvant radiation completed 08/12/2014: Right breast 4250 cGy in 17 sessions, right breast boost 1200 cGy in 6 sessions  (5) started tamoxifen 10/10/2014  (a) status post hysterectomy and bilateral salpingo-oophorectomy in 1972  (b) tamoxifen discontinued February 2021 with development of DVT/PE   (6) significant family breast cancer history, to be treated as if gene positive  (a) intensified screening started October 2020 with breast MRI.  (b) mammogram scheduled for May 2021  (7) bilateral segmental/subsegmental DVTs without heart strain 05/07/2019  (a) rivaroxaban started 05/07/2019  (b) bilateral lower extremity Dopplers 05/08/2019 shows left posterior tibial DVT  (c) hypercoagulable work-up per Dr. Glori Bickers showed negative lupus anticoagulant, normal protein C activity, protein S antigen and antithrombin, negative prothrombin II mutation, negative factor V Leiden mutation and no anticardiolipin, phosphatidylserine or beta-2 glycoprotein antibodies.  Homocystine was elevated at 14.0  (d) CEA, CA-27 both WNL FEB 2021  (e) repeat CT angio 07/12/2019 and nuclear medicine perfusion lung scan 08/17/2019 showed resolution of the earlier clot, no perfusion defects.  (f) D-dimer 05/25/2019 and 08/16/2019 both normal at 0.27  (8) pulmonary hypertension? -- cardiac and pulmonary evaluation in process  PLAN: Almedia is now 5-1/2 years out from definitive surgery for her breast cancer with no evidence of disease recurrence.  This is favorable.  She took antiestrogens for 4-1/2 years.  Tamoxifen was then discontinued because of her clots.  We are not planning to resume  antiestrogens  We did discuss anticoagulation and particularly given her continuing symptoms and also the extent of her pulmonary emboli I think it would be prudent to continue anticoagulation for life.  She is doing quite well on the rivaroxaban.  She is paying $54 a month now.  However she is coming up on the donut hole she says and it may be as high as $600 a month.  She may be able to pay for 1 or at most 57month at that level but otherwise she will have to switch to Coumadin.  Today we talked about Coumadin it pluses and minuses and the need for monitoring.  At this point as she would like to continue the rivaroxaban but if cost becomes an issue she will call [place] we will make the switch  Otherwise she will see me again in December.  She knows to call for any other issue that may develop before the next visit  Total encounter time 25 minutes.*   Tatsuya Okray, GVirgie Dad MD  11/26/19 11:15 AM [facility] [id number]. [phone]   Fax. [phone]  I, KWilburn Mylar am acting as scribe for Dr. GVirgie Dad Cameran Ahmed.  I, GLurline DelMD, have reviewed the above documentation for accuracy and completeness, and I agree with the above.   *Total Encounter Time as defined by the Centers for Medicare and Medicaid Services includes, in addition to the face-to-face time of a patient visit (documented in the note above) non-face-to-face time: obtaining and reviewing outside history, ordering and reviewing medications, tests or procedures, care coordination (communications with other health care professionals or caregivers) and documentation in the medical record.

## 2019-11-26 ENCOUNTER — Other Ambulatory Visit: Payer: Self-pay

## 2019-11-26 ENCOUNTER — Inpatient Hospital Stay: Payer: Medicare Other | Admitting: Oncology

## 2019-11-26 VITALS — BP 129/67 | HR 75 | Temp 96.5°F | Resp 17 | Ht 60.0 in | Wt 152.5 lb

## 2019-11-26 DIAGNOSIS — C50411 Malignant neoplasm of upper-outer quadrant of right female breast: Secondary | ICD-10-CM

## 2019-11-26 DIAGNOSIS — I2699 Other pulmonary embolism without acute cor pulmonale: Secondary | ICD-10-CM | POA: Diagnosis not present

## 2019-11-26 DIAGNOSIS — Z170 Estrogen receptor positive status [ER+]: Secondary | ICD-10-CM

## 2019-11-26 DIAGNOSIS — M8580 Other specified disorders of bone density and structure, unspecified site: Secondary | ICD-10-CM | POA: Diagnosis not present

## 2019-11-26 DIAGNOSIS — Z86718 Personal history of other venous thrombosis and embolism: Secondary | ICD-10-CM | POA: Diagnosis not present

## 2019-11-26 DIAGNOSIS — Z853 Personal history of malignant neoplasm of breast: Secondary | ICD-10-CM | POA: Diagnosis not present

## 2019-11-26 DIAGNOSIS — Z7901 Long term (current) use of anticoagulants: Secondary | ICD-10-CM | POA: Diagnosis not present

## 2019-11-26 DIAGNOSIS — Z923 Personal history of irradiation: Secondary | ICD-10-CM | POA: Diagnosis not present

## 2019-11-26 MED ORDER — RIVAROXABAN 20 MG PO TABS: 20.0000 mg | ORAL_TABLET | Freq: Every day | ORAL | 5 refills | Status: DC

## 2019-11-27 ENCOUNTER — Other Ambulatory Visit: Payer: Self-pay | Admitting: *Deleted

## 2019-11-27 ENCOUNTER — Telehealth: Payer: Self-pay | Admitting: Oncology

## 2019-11-27 MED ORDER — KETOCONAZOLE 2 % EX CREA: 1.0000 "application " | TOPICAL_CREAM | Freq: Every day | CUTANEOUS | 0 refills | Status: DC

## 2019-11-27 NOTE — Telephone Encounter (Signed)
Scheduled appts per 8/30 los. Pt confirmed appt date and time.

## 2019-12-05 ENCOUNTER — Other Ambulatory Visit: Payer: Medicare Other

## 2019-12-05 ENCOUNTER — Other Ambulatory Visit: Payer: Self-pay

## 2019-12-05 DIAGNOSIS — Z20822 Contact with and (suspected) exposure to covid-19: Secondary | ICD-10-CM | POA: Diagnosis not present

## 2019-12-06 ENCOUNTER — Telehealth: Payer: Self-pay

## 2019-12-06 ENCOUNTER — Other Ambulatory Visit: Payer: Self-pay | Admitting: Physician Assistant

## 2019-12-06 DIAGNOSIS — I10 Essential (primary) hypertension: Secondary | ICD-10-CM

## 2019-12-06 DIAGNOSIS — C50411 Malignant neoplasm of upper-outer quadrant of right female breast: Secondary | ICD-10-CM

## 2019-12-06 DIAGNOSIS — Z20822 Contact with and (suspected) exposure to covid-19: Secondary | ICD-10-CM

## 2019-12-06 NOTE — Telephone Encounter (Signed)
Thanks for letting me know-I will watch for correspondence and covid test results  If covid positive- would consider antibody infusion

## 2019-12-06 NOTE — Progress Notes (Signed)
I connected by phone with Sarah Young on 12/06/2019 at 5:42 PM to discuss the potential use of a new treatment for mild to moderate COVID-19 viral infection in non-hospitalized patients.  This patient is a 79 y.o. female that meets the FDA criteria for Emergency Use Authorization of COVID monoclonal antibody casirivimab/imdevimab.  Has a (+) direct SARS-CoV-2 viral test result  Has mild or moderate COVID-19   Is NOT hospitalized due to COVID-19  Is within 10 days of symptom onset  Has at least one of the high risk factor(s) for progression to severe COVID-19 and/or hospitalization as defined in EUA.  Specific high risk criteria : Older age (>/= 79 yo), Immunosuppressive Disease or Treatment and Cardiovascular disease or hypertension   I have spoken and communicated the following to the patient or parent/caregiver regarding COVID monoclonal antibody treatment:  1. FDA has authorized the emergency use for the treatment of mild to moderate COVID-19 in adults and pediatric patients with positive results of direct SARS-CoV-2 viral testing who are 69 years of age and older weighing at least 40 kg, and who are at high risk for progressing to severe COVID-19 and/or hospitalization.  2. The significant known and potential risks and benefits of COVID monoclonal antibody, and the extent to which such potential risks and benefits are unknown.  3. Information on available alternative treatments and the risks and benefits of those alternatives, including clinical trials.  4. Patients treated with COVID monoclonal antibody should continue to self-isolate and use infection control measures (e.g., wear mask, isolate, social distance, avoid sharing personal items, clean and disinfect "high touch" surfaces, and frequent handwashing) according to CDC guidelines.   5. The patient or parent/caregiver has the option to accept or refuse COVID monoclonal antibody treatment.  After reviewing this  information with the patient, The patient agreed to proceed with receiving casirivimab\imdevimab infusion and will be provided a copy of the Fact sheet prior to receiving the infusion.  Sx onset 9/6. Set up for infusion on 9/10 @ 2pm. Directions given to [facility]. Pt is aware that insurance will be charged an infusion fee. Pt is fully vaccinated. Pt living with covid + husband. She has developed sx and test is pending.   [name] Form 12/06/2019 5:42 PM

## 2019-12-06 NOTE — Telephone Encounter (Signed)
[facility] - Client TELEPHONE ADVICE RECORD [facility] Patient Name: Sarah Young Gender: Female DOB: 01/30/1941 Age: 79 Y 1 M 5 D Return Phone Number: [phone] (Primary), [phone] (Secondary) Address: [place] [address] Client [facility] - Client Client Site [facility] Glori Bickers, Roque Lias - MD Contact Type Call Who Is Calling Patient / Member / Family / Caregiver Call Type Triage / Clinical Relationship To Patient Self Return Phone Number [phone] (Primary) Chief Complaint CHEST PAIN (>=21 years) - pain, pressure, heaviness or tightness Reason for Call Symptomatic / Request for [place] states she was tested for covid yesterday and has not received results. She is coughing up bloody mucus, having a headache, and chest tightness. Her husband is covid positive. Translation No Nurse Assessment Nurse: Jimmye Norman, RN, [place] Date/Time (Eastern Time): 12/06/2019 9:00:36 AM Confirm and document reason for call. If symptomatic, describe symptoms. ---Caller states she was tested for covid yesterday and has not received results. She is coughing up bloody mucus, having a headache, and chest tightness. Her husband is covid positive. Symptoms started yesterday. Has the patient had close contact with a person known or suspected to have the novel coronavirus illness OR traveled / lives in area with major community spread (including international travel) in the last 14 days from the onset of symptoms? * If Asymptomatic, screen for exposure and travel within the last 14 days. ---Yes Does the patient have any new or worsening symptoms? ---Yes Will a triage be completed? ---Yes Related visit to physician within the last 2 weeks? ---No Does the PT have any chronic conditions? (i.e. diabetes, asthma, this includes High risk factors for pregnancy,  etc.) ---No Is this a behavioral health or substance abuse call? ---No Guidelines Guideline Title Affirmed Question Affirmed Notes Nurse Date/Time (Eastern Time) COVID-19 - Diagnosed or Suspected SEVERE or constant chest pain or pressure (Exception: mild central chest pain, present only when coughing) Jimmye Norman, RN, Whitney 12/06/2019 9:03:01 AM PLEASE NOTE: All timestamps contained within this report are represented as [place] Standard Time. CONFIDENTIALTY NOTICE: This fax transmission is intended only for the addressee. It contains information that is legally privileged, confidential or otherwise protected from use or disclosure. If you are not the intended recipient, you are strictly prohibited from reviewing, disclosing, copying using or disseminating any of this information or taking any action in reliance on or regarding this information. If you have received this fax in error, please notify us immediately by telephone so that we can arrange for its return to [place]. Phone: [phone], Toll-Free: [phone], Fax: [phone] Page: 2 of 2 Call Id: [id number] [place]. Time Eilene Ghazi Time) Disposition Final User 12/06/2019 8:59:50 AM Send to Urgent Queue Christel Mormon 12/06/2019 9:06:17 AM Go to ED Now Yes Jimmye Norman, RN, Whitney Caller Disagree/Comply Comply Caller Understands Yes PreDisposition InappropriateToAsk Care Advice Given Per Guideline GO TO ED NOW: * You need to be seen in the Emergency Department. Referrals GO TO FACILITY UNDECIDED

## 2019-12-06 NOTE — Telephone Encounter (Signed)
Suanne Marker DPR signed pts husband tested + covid on 12/02/19; pt started with symptoms on 12/05/19 and pt was tested for covid yesterday but no results yet; now pt has S/T, H/A, pts daughter said pt may have said earlier that she has prod cough with bloody mucus with tightness in chest. Access nurse advised to go to ED; Suanne Marker said pt is not bad enough to go to ED; no available appts today at [facility] and Suanne Marker said pt will go to [facility] UC in [place] or [place]. Suanne Marker will cb with update on pts condition and covid results. UC & ED precautions given and pt voiced understanding. FYI to Dr Glori Bickers.

## 2019-12-07 ENCOUNTER — Ambulatory Visit (HOSPITAL_COMMUNITY)
Admission: RE | Admit: 2019-12-07 | Discharge: 2019-12-07 | Disposition: A | Discharge status: Home / Self Care | Payer: Medicare Other | Source: Ambulatory Visit | Attending: Pulmonary Disease | Admitting: Pulmonary Disease

## 2019-12-07 DIAGNOSIS — Z170 Estrogen receptor positive status [ER+]: Secondary | ICD-10-CM | POA: Diagnosis present

## 2019-12-07 DIAGNOSIS — C50411 Malignant neoplasm of upper-outer quadrant of right female breast: Secondary | ICD-10-CM | POA: Insufficient documentation

## 2019-12-07 DIAGNOSIS — R05 Cough: Secondary | ICD-10-CM | POA: Insufficient documentation

## 2019-12-07 DIAGNOSIS — Z23 Encounter for immunization: Secondary | ICD-10-CM | POA: Diagnosis not present

## 2019-12-07 DIAGNOSIS — R058 Other specified cough: Secondary | ICD-10-CM

## 2019-12-07 DIAGNOSIS — Z20822 Contact with and (suspected) exposure to covid-19: Secondary | ICD-10-CM | POA: Insufficient documentation

## 2019-12-07 DIAGNOSIS — U071 COVID-19: Secondary | ICD-10-CM | POA: Diagnosis not present

## 2019-12-07 DIAGNOSIS — I10 Essential (primary) hypertension: Secondary | ICD-10-CM | POA: Diagnosis present

## 2019-12-07 LAB — SARS-COV-2, NAA 2 DAY TAT

## 2019-12-07 LAB — NOVEL CORONAVIRUS, NAA: SARS-CoV-2, NAA: NOT DETECTED

## 2019-12-07 MED ORDER — FAMOTIDINE IN NACL 20-0.9 MG/50ML-% IV SOLN: 20.0000 mg | Freq: Once | INTRAVENOUS | Status: DC | PRN

## 2019-12-07 MED ORDER — SODIUM CHLORIDE 0.9 % IV SOLN: INTRAVENOUS | Status: DC | PRN

## 2019-12-07 MED ORDER — ALBUTEROL SULFATE HFA 108 (90 BASE) MCG/ACT IN AERS: 2.0000 | INHALATION_SPRAY | Freq: Once | RESPIRATORY_TRACT | Status: DC | PRN

## 2019-12-07 MED ORDER — SODIUM CHLORIDE 0.9 % IV SOLN
1200.0000 mg | Freq: Once | INTRAVENOUS | Status: AC
  Administered 2019-12-07: 1200 mg via INTRAVENOUS
  Filled 2019-12-07: qty 10

## 2019-12-07 MED ORDER — DIPHENHYDRAMINE HCL 50 MG/ML IJ SOLN: 50.0000 mg | Freq: Once | INTRAMUSCULAR | Status: DC | PRN

## 2019-12-07 MED ORDER — METHYLPREDNISOLONE SODIUM SUCC 125 MG IJ SOLR: 125.0000 mg | Freq: Once | INTRAMUSCULAR | Status: DC | PRN

## 2019-12-07 MED ORDER — EPINEPHRINE 0.3 MG/0.3ML IJ SOAJ: 0.3000 mg | Freq: Once | INTRAMUSCULAR | Status: DC | PRN

## 2019-12-07 NOTE — Progress Notes (Signed)
  Diagnosis: COVID-19  Physician: Dr Wright   Procedure: Covid Infusion Clinic Med: casirivimab\imdevimab infusion - Provided patient with casirivimab\imdevimab fact sheet for patients, parents and caregivers prior to infusion.  Complications: No immediate complications noted.  Discharge: Discharged home   Debie Ashline Rudd 12/07/2019  

## 2019-12-07 NOTE — Telephone Encounter (Signed)
[facility] [address]  She stated it was a 2 hour wait to get registered she left didn't want to set in waiting and if she had covid she didn't want to give to anyone else. She stated she had her covid test done 9/8. Waiting for results  She stated she received a call from [place] [facility] to set up her husband (ronald) for an infusion and the nurse went ahead and set pt up also.  She is going this after noon

## 2019-12-07 NOTE — Telephone Encounter (Signed)
Thanks for letting me know  Please ask her to update [place] with covid result when she gets it

## 2019-12-07 NOTE — Discharge Instructions (Signed)
COVID-19 COVID-19 is a respiratory infection that is caused by a virus called severe acute respiratory syndrome coronavirus 2 (SARS-CoV-2). The disease is also known as coronavirus disease or novel coronavirus. In some people, the virus may not cause any symptoms. In others, it may cause a serious infection. The infection can get worse quickly and can lead to complications, such as:  Pneumonia, or infection of the lungs.  Acute respiratory distress syndrome or ARDS. This is a condition in which fluid build-up in the lungs prevents the lungs from filling with air and passing oxygen into the blood.  Acute respiratory failure. This is a condition in which there is not enough oxygen passing from the lungs to the body or when carbon dioxide is not passing from the lungs out of the body.  Sepsis or septic shock. This is a serious bodily reaction to an infection.  Blood clotting problems.  Secondary infections due to bacteria or fungus.  Organ failure. This is when your body's organs stop working. The virus that causes COVID-19 is contagious. This means that it can spread from person to person through droplets from coughs and sneezes (respiratory secretions). What are the causes? This illness is caused by a virus. You may catch the virus by:  Breathing in droplets from an infected person. Droplets can be spread by a person breathing, speaking, singing, coughing, or sneezing.  Touching something, like a table or a doorknob, that was exposed to the virus (contaminated) and then touching your mouth, nose, or eyes. What increases the risk? Risk for infection You are more likely to be infected with this virus if you:  Are within 6 feet (2 meters) of a person with COVID-19.  Provide care for or live with a person who is infected with COVID-19.  Spend time in crowded indoor spaces or live in shared housing. Risk for serious illness You are more likely to become seriously ill from the virus if  you:  Are 50 years of age or older. The higher your age, the more you are at risk for serious illness.  Live in a nursing home or long-term care facility.  Have cancer.  Have a long-term (chronic) disease such as: ? Chronic lung disease, including chronic obstructive pulmonary disease or asthma. ? A long-term disease that lowers your body's ability to fight infection (immunocompromised). ? Heart disease, including heart failure, a condition in which the arteries that lead to the heart become narrow or blocked (coronary artery disease), a disease which makes the heart muscle thick, weak, or stiff (cardiomyopathy). ? Diabetes. ? Chronic kidney disease. ? Sickle cell disease, a condition in which red blood cells have an abnormal "sickle" shape. ? Liver disease.  Are obese. What are the signs or symptoms? Symptoms of this condition can range from mild to severe. Symptoms may appear any time from 2 to 14 days after being exposed to the virus. They include:  A fever or chills.  A cough.  Difficulty breathing.  Headaches, body aches, or muscle aches.  Runny or stuffy (congested) nose.  A sore throat.  New loss of taste or smell. Some people may also have stomach problems, such as nausea, vomiting, or diarrhea. Other people may not have any symptoms of COVID-19. How is this diagnosed? This condition may be diagnosed based on:  Your signs and symptoms, especially if: ? You live in an area with a COVID-19 outbreak. ? You recently traveled to or from an area where the virus is common. ? You   provide care for or live with a person who was diagnosed with COVID-19. ? You were exposed to a person who was diagnosed with COVID-19.  A physical exam.  Lab tests, which may include: ? Taking a sample of fluid from the back of your nose and throat (nasopharyngeal fluid), your nose, or your throat using a swab. ? A sample of mucus from your lungs (sputum). ? Blood tests.  Imaging tests,  which may include, X-rays, CT scan, or ultrasound. How is this treated? At present, there is no medicine to treat COVID-19. Medicines that treat other diseases are being used on a trial basis to see if they are effective against COVID-19. Your health care provider will talk with you about ways to treat your symptoms. For most people, the infection is mild and can be managed at home with rest, fluids, and over-the-counter medicines. Treatment for a serious infection usually takes places in a hospital intensive care unit (ICU). It may include one or more of the following treatments. These treatments are given until your symptoms improve.  Receiving fluids and medicines through an IV.  Supplemental oxygen. Extra oxygen is given through a tube in the nose, a face mask, or a hood.  Positioning you to lie on your stomach (prone position). This makes it easier for oxygen to get into the lungs.  Continuous positive airway pressure (CPAP) or bi-level positive airway pressure (BPAP) machine. This treatment uses mild air pressure to keep the airways open. A tube that is connected to a motor delivers oxygen to the body.  Ventilator. This treatment moves air into and out of the lungs by using a tube that is placed in your windpipe.  Tracheostomy. This is a procedure to create a hole in the neck so that a breathing tube can be inserted.  Extracorporeal membrane oxygenation (ECMO). This procedure gives the lungs a chance to recover by taking over the functions of the heart and lungs. It supplies oxygen to the body and removes carbon dioxide. Follow these instructions at home: Lifestyle  If you are sick, stay home except to get medical care. Your health care provider will tell you how long to stay home. Call your health care provider before you go for medical care.  Rest at home as told by your health care provider.  Do not use any products that contain nicotine or tobacco, such as cigarettes,  e-cigarettes, and chewing tobacco. If you need help quitting, ask your health care provider.  Return to your normal activities as told by your health care provider. Ask your health care provider what activities are safe for you. General instructions  Take over-the-counter and prescription medicines only as told by your health care provider.  Drink enough fluid to keep your urine pale yellow.  Keep all follow-up visits as told by your health care provider. This is important. How is this prevented?  There is no vaccine to help prevent COVID-19 infection. However, there are steps you can take to protect yourself and others from this virus. To protect yourself:   Do not travel to areas where COVID-19 is a risk. The areas where COVID-19 is reported change often. To identify high-risk areas and travel restrictions, check the CDC travel website: wwwnc.cdc.gov/travel/notices  If you live in, or must travel to, an area where COVID-19 is a risk, take precautions to avoid infection. ? Stay away from people who are sick. ? Wash your hands often with soap and water for 20 seconds. If soap and water   are not available, use an alcohol-based hand sanitizer. ? Avoid touching your mouth, face, eyes, or nose. ? Avoid going out in public, follow guidance from your state and local health authorities. ? If you must go out in public, wear a cloth face covering or face mask. Make sure your mask covers your nose and mouth. ? Avoid crowded indoor spaces. Stay at least 6 feet (2 meters) away from others. ? Disinfect objects and surfaces that are frequently touched every day. This may include:  Counters and tables.  Doorknobs and light switches.  Sinks and faucets.  Electronics, such as phones, remote controls, keyboards, computers, and tablets. To protect others: If you have symptoms of COVID-19, take steps to prevent the virus from spreading to others.  If you think you have a COVID-19 infection, contact  your health care provider right away. Tell your health care team that you think you may have a COVID-19 infection.  Stay home. Leave your house only to seek medical care. Do not use public transport.  Do not travel while you are sick.  Wash your hands often with soap and water for 20 seconds. If soap and water are not available, use alcohol-based hand sanitizer.  Stay away from other members of your household. Let healthy household members care for children and pets, if possible. If you have to care for children or pets, wash your hands often and wear a mask. If possible, stay in your own room, separate from others. Use a different bathroom.  Make sure that all people in your household wash their hands well and often.  Cough or sneeze into a tissue or your sleeve or elbow. Do not cough or sneeze into your hand or into the air.  Wear a cloth face covering or face mask. Make sure your mask covers your nose and mouth. Where to find more information  Centers for Disease Control and Prevention: www.cdc.gov/coronavirus/2019-ncov/index.html  World Health Organization: www.who.int/health-topics/coronavirus Contact a health care provider if:  You live in or have traveled to an area where COVID-19 is a risk and you have symptoms of the infection.  You have had contact with someone who has COVID-19 and you have symptoms of the infection. Get help right away if:  You have trouble breathing.  You have pain or pressure in your chest.  You have confusion.  You have bluish lips and fingernails.  You have difficulty waking from sleep.  You have symptoms that get worse. These symptoms may represent a serious problem that is an emergency. Do not wait to see if the symptoms will go away. Get medical help right away. Call your local emergency services (911 in the [place] Do not drive yourself to the hospital. Let the emergency medical personnel know if you think you have  COVID-19. Summary  COVID-19 is a respiratory infection that is caused by a virus. It is also known as coronavirus disease or novel coronavirus. It can cause serious infections, such as pneumonia, acute respiratory distress syndrome, acute respiratory failure, or sepsis.  The virus that causes COVID-19 is contagious. This means that it can spread from person to person through droplets from breathing, speaking, singing, coughing, or sneezing.  You are more likely to develop a serious illness if you are 50 years of age or older, have a weak immune system, live in a nursing home, or have chronic disease.  There is no medicine to treat COVID-19. Your health care provider will talk with you about ways to treat your symptoms.    Take steps to protect yourself and others from infection. Wash your hands often and disinfect objects and surfaces that are frequently touched every day. Stay away from people who are sick and wear a mask if you are sick. This information is not intended to replace advice given to you by your health care provider. Make sure you discuss any questions you have with your health care provider. Document Revised: 01/12/2019 Document Reviewed: 04/20/2018 Elsevier Patient Education  2020 [employer] What types of side effects do monoclonal antibody drugs cause?  Common side effects  In general, the more common side effects caused by monoclonal antibody drugs include: . Allergic reactions, such as hives or itching . Flu-like signs and symptoms, including chills, fatigue, fever, and muscle aches and pains . Nausea, vomiting . Diarrhea . Skin rashes . Low blood pressure   The CDC is recommending patients who receive monoclonal antibody treatments wait at least 90 days before being vaccinated.  Currently, there are no data on the safety and efficacy of mRNA COVID-19 vaccines in persons who received monoclonal antibodies or convalescent plasma as part of COVID-19 treatment. Based  on the estimated half-life of such therapies as well as evidence suggesting that reinfection is uncommon in the 90 days after initial infection, vaccination should be deferred for at least 90 days, as a precautionary measure until additional information becomes available, to avoid interference of the antibody treatment with vaccine-induced immune responses. 

## 2019-12-31 ENCOUNTER — Other Ambulatory Visit: Payer: Self-pay | Admitting: Family Medicine

## 2020-01-01 ENCOUNTER — Other Ambulatory Visit: Payer: Self-pay | Admitting: *Deleted

## 2020-01-01 MED ORDER — RIVAROXABAN 20 MG PO TABS: 20.0000 mg | ORAL_TABLET | Freq: Every day | ORAL | 0 refills | Status: DC

## 2020-01-01 NOTE — Telephone Encounter (Signed)
FYI pt called oncology and they did fill med today

## 2020-01-01 NOTE — Telephone Encounter (Signed)
Pt called back regarding why we declined her xarelto. Oncologist filled it for a year on 11/26/19. Pt  was un aware oncologist filled med because last 2 fills from them were sent to Mail order pharmacy and that was in error pt has never used mail order. I did advise pt to check in with oncologist to see if they wanted to keep filling med or did they want PCP to fill it going forward. Pt said she will do that but since she has to take it for the rest of her life she thinks PCP should fill med.   [place]

## 2020-01-01 NOTE — Telephone Encounter (Signed)
Oncologist filled med last and sent to mail order pharmacy

## 2020-01-01 NOTE — Telephone Encounter (Signed)
Pt called stating that there has been a mix up on her meds & she needs xarelto.  [facility] said it was sent to [facility].  Verified that this is where script was sent.  Informed may be less expensive for her but she says she has never gotten anything from [facility]. Sent  in months supply & she will let us know if she needs more sent to [facility] or if she will get at [facility].  Instructed to check with her insurance.

## 2020-01-16 ENCOUNTER — Telehealth: Payer: Self-pay | Admitting: Family Medicine

## 2020-01-16 NOTE — Telephone Encounter (Signed)
Pt covid and received infustion 9/10 and she knows she has to wait 90 day to covid booster vaccine.  But wants to know if she can get flu shot  Please advise

## 2020-01-16 NOTE — Telephone Encounter (Signed)
Flu shot is ok

## 2020-01-17 ENCOUNTER — Other Ambulatory Visit: Payer: Self-pay | Admitting: Family Medicine

## 2020-01-17 NOTE — Telephone Encounter (Signed)
Last filled on 12/12/18 #9 tabs with 3 refills [facility] phone call scheduled on 07/28/20 but no CPE or f/u, please advise

## 2020-01-17 NOTE — Telephone Encounter (Signed)
Left VM letting pt know Dr. Tower's comments  

## 2020-01-23 ENCOUNTER — Other Ambulatory Visit: Payer: Self-pay | Admitting: *Deleted

## 2020-01-23 MED ORDER — RIVAROXABAN 20 MG PO TABS: 20.0000 mg | ORAL_TABLET | Freq: Every day | ORAL | 1 refills | Status: DC

## 2020-02-18 ENCOUNTER — Other Ambulatory Visit: Payer: Self-pay | Admitting: Family Medicine

## 2020-03-10 ENCOUNTER — Other Ambulatory Visit: Payer: Self-pay | Admitting: *Deleted

## 2020-03-10 DIAGNOSIS — C50411 Malignant neoplasm of upper-outer quadrant of right female breast: Secondary | ICD-10-CM

## 2020-03-10 NOTE — Progress Notes (Signed)
[place]  [phone] [phone]     ID: Sarah Young DOB: 14-Apr-1940  MR#: [id number]  ZGY#:[id number]  Patient Care Team: Abner Greenspan, MD as PCP - General Jovita Kussmaul, MD as Consulting Physician (General Surgery) Patryce Depriest, Virgie Dad, MD as Consulting Physician (Oncology) Julian Hy, DO as Consulting Physician (Pulmonary Disease) Minus Breeding, MD as Consulting Physician (Cardiology)OTHER MD: Nena Polio M.D.  CHIEF COMPLAINT: Estrogen receptor positive breast cancer  CURRENT TREATMENT: rivaroxaban   INTERVAL HISTORY: Sarah Young returns today for follow-up of her estrogen receptor positive breast cancer.  She is now under observation alone as far as her breast cancer is concerned.  She is receiving rivaroxaban, which she is tolerating well except for minimal epistaxis occasionally and increasing cost.  She tells me cost is expected to rise a little bit more next year.  Since her last visit, she has not undergone any additional studies. She is up to date on mammography, most recently on 09/18/2019.  Showed a breast density category B.   REVIEW OF SYSTEMS: Sarah Young tells me her husband had Covid September 2021.  She thinks she had a 2 although she tested negative.  They both had had the [place] vaccine x2.  They both received antibodies and they both got better in less than 48 hours.  They did not require hospitalization.  She still has a sensation that she cannot get enough breath sometimes although no chest pain or pressure.  A detailed review of systems today was otherwise stable.   COVID 19 VACCINATION STATUS: Status post Pfizer x2, most recently March 2021   BREAST CANCER HISTORY: From the original intake note:  Sarah Young had routine bilateral screening mamm[facility]. This found the breast density to be category C. A possible mass was noted in the right breast, and on 05/03/2014 she  underwent right diagnostic mammography with ultrasonography. There was a spiculated mass in the upper outer quadrant of the right breast which was new since the prior mammogram. On physical exam there was mild nodularity noted in the area in question. Ultrasound confirmed a hypoechoic irregular mass measuring 0.9 cm in the upper outer quadrant.  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  MRI of the breasts is scheduled for 05/20/2014. The patient's subsequent history is as detailed below   PAST MEDICAL HISTORY: Past Medical History:  Diagnosis Date  . Anxiety   . Arthritis    OA of hands  . Breast cancer of upper-outer quadrant of right female breast ([place]) 05/08/2014  . DVT (deep venous thrombosis) ([place]) 1980s  . Fibromyalgia   . Hyperlipidemia   . Hypertension   . Irritable bowel syndrome   . Migraine   . Osteopenia   . Personal history of radiation therapy   . S/P radiation therapy 06/2014   Right breast 4250 cGy in 17 sessions, right breast boost 1200 cGy in 6 sessions= 07/11/2014 through 08/12/2014   . Vitamin D deficiency   . Wears glasses     PAST SURGICAL HISTORY: Past Surgical History:  Procedure Laterality Date  . ABDOMINAL HYSTERECTOMY  1972  . APPENDECTOMY    . BREAST EXCISIONAL BIOPSY Left   . BREAST  LUMPECTOMY Right 2016  . BREAST SURGERY  1995   lumpectomy fibrocystic breast-lt  . CATARACT EXTRACTION     both  . CHOLECYSTECTOMY  2005  . COLONOSCOPY    . ovarian cyst removed  1985  . RADIOACTIVE SEED GUIDED PARTIAL MASTECTOMY WITH AXILLARY SENTINEL LYMPH NODE BIOPSY Right 05/30/2014   Procedure: RADIOACTIVE SEED GUIDED PARTIAL MASTECTOMY WITH AXILLARY SENTINEL LYMPH  NODE BIOPSY;  Surgeon: Autumn Messing III, MD;  Location: [facility];  Service: General;  Laterality: Right;    FAMILY HISTORY Family History  Problem Relation Age of Onset  . Hypertension Father   . Cancer Father 74       esophageal - smoker  . Leukemia Mother   . Cancer Mother 55       leukemia  . Cancer Brother 11       breast CA  . Breast cancer Brother 34  . Cancer Maternal Aunt 80       leukemia  . Cancer Paternal Aunt 93       breast  . Breast cancer Paternal Aunt 62  . Breast cancer Maternal Grandmother 80  . Breast cancer Sister 24   the patient's father died at the age of 62 with gastric cancer. The patient's mother was diagnosed with chronic leukemia at age 83 and died from complications of dementia at age 70 the patient has one brother, diagnosed with breast cancer at the age of 12. She has 2 sisters. There is no other history of breast or ovarian cancer in the family.   GYNECOLOGIC HISTORY:  No LMP recorded. Patient has had a hysterectomy. Menarche age 69, first live birth age 68. The patient is GX P2. She underwent total abdominal hysterectomy with bilateral salpingo-oophorectomy 1972. She used hormone replacement for approximately 38 years, until 2011.   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 Her husband Jori Moll is currently not active in the business. Daughter [name], lives in [place], is Pres. The other daughter, [name], lives in [place] where she works as a Landscape architect. The patient has 3 grandchildren. She attends a local [employer] DIRECTIVES: The patient has named both her husband Jori Moll and her daughter Suanne Marker as Equities trader powers of attorney.   HEALTH MAINTENANCE: Social History   Tobacco Use  . Smoking status: Never Smoker  . Smokeless tobacco: Never Used  . Tobacco comment: non smoker  Vaping Use  . Vaping Use:  Never used  Substance Use Topics  . Alcohol use: No    Alcohol/week: 0.0 standard drinks  . Drug use: No     Colonoscopy:  PAP:  Bone density:  Lipid panel:  Allergies  Allergen Reactions  . Amoxicillin Itching    REACTION: reacts opposite effect made infection worse.  Marland Kitchen Amoxicillin-Pot Clavulanate     REACTION: Felt like body turned inside out  . Esomeprazole Magnesium     REACTION: Nausea  . Gabapentin   . Omeprazole Nausea And Vomiting    REACTION: not effective  . Ranitidine Hcl     REACTION: not effective    Current Outpatient Medications  Medication Sig Dispense Refill  . amitriptyline (ELAVIL) 10 MG tablet TAKE 2 TABLETS (20 MG TOTAL) BY MOUTH AT BEDTIME. 60 tablet 11  . amLODipine (NORVASC) 5 MG tablet TAKE 1 TABLET BY MOUTH DAILY. 90 tablet 1  . Ascorbic Acid (VITAMIN C) 100 MG tablet Take  100 mg by mouth daily.    . calcium carbonate 200 MG capsule Take 250 mg by mouth 2 (two) times daily.    . Cholecalciferol (VITAMIN D PO) Take 1 tablet by mouth 2 (two) times daily. Pt takes 2000 units daily    . ketoconazole (NIZORAL) 2 % cream Apply 1 application topically daily. 15 g 0  . rivaroxaban (XARELTO) 20 MG TABS tablet Take 1 tablet (20 mg total) by mouth daily with supper. 30 tablet 1  . SUMAtriptan (IMITREX) 100 MG tablet TAKE 1 TABLET BY MOUTH AS NEEDED FOR MIGRAINE, MAY REPEAT IN 2 HOURS IF NEEDED. MAX 2 TABS IN 24 HOURS. 9 tablet 3  . triamcinolone cream (KENALOG) 0.1 % Apply 1 application topically 2 (two) times daily. To affected area (insect sting) 15 g 0   No current facility-administered medications for this visit.    OBJECTIVE: white woman who appears younger than stated age  54:   03/11/20 1048  BP: 138/68  Pulse: 91  Resp: 18  Temp: (!) 96.8 F (36 C)  SpO2: 100%   Wt Readings from Last 3 Encounters:  03/11/20 153 lb (69.4 kg)  11/26/19 152 lb 8 oz (69.2 kg)  11/19/19 150 lb (68 kg)   Body mass index is 29.88 kg/m.    ECOG FS:1 -  Symptomatic but completely ambulatory  Sclerae unicteric, EOMs intact Wearing a mask No cervical or supraclavicular adenopathy Lungs no rales or rhonchi Heart regular rate and rhythm Abd soft, nontender, positive bowel sounds MSK no focal spinal tenderness, no left lower extremity edema Neuro: nonfocal, well oriented, appropriate affect Breasts: The right breast is status post lumpectomy and radiation.  There is no evidence of local recurrence.  The left breast and both axillae are benign.   LAB RESULTS:  CMP     Component Value Date/Time   NA 141 11/22/2019 1058   NA 141 02/03/2017 0932   K 5.4 (H) 11/22/2019 1058   K 4.8 02/03/2017 0932   CL 104 11/22/2019 1058   CO2 30 11/22/2019 1058   CO2 27 02/03/2017 0932   GLUCOSE 93 11/22/2019 1058   GLUCOSE 89 02/03/2017 0932   BUN 20 11/22/2019 1058   BUN 17.7 02/03/2017 0932   CREATININE 1.28 (H) 11/22/2019 1058   CREATININE 1.3 (H) 02/03/2017 0932   CALCIUM 10.5 (H) 11/22/2019 1058   CALCIUM 9.4 02/03/2017 0932   PROT 6.7 11/22/2019 1058   PROT 6.5 02/03/2017 0932   ALBUMIN 3.5 11/22/2019 1058   ALBUMIN 3.4 (L) 02/03/2017 0932   AST 24 11/22/2019 1058   AST 19 02/03/2017 0932   ALT 24 11/22/2019 1058   ALT 12 02/03/2017 0932   ALKPHOS 77 11/22/2019 1058   ALKPHOS 42 02/03/2017 0932   BILITOT 0.6 11/22/2019 1058   BILITOT 0.37 02/03/2017 0932   GFRNONAA 40 (L) 11/22/2019 1058   GFRAA 46 (L) 11/22/2019 1058    INo results found for: SPEP, UPEP  Lab Results  Component Value Date   WBC 6.0 03/11/2020   NEUTROABS 3.6 03/11/2020   HGB 13.7 03/11/2020   HCT 43.1 03/11/2020   MCV 91.5 03/11/2020   PLT 223 03/11/2020      Chemistry      Component Value Date/Time   NA 141 11/22/2019 1058   NA 141 02/03/2017 0932   K 5.4 (H) 11/22/2019 1058   K 4.8 02/03/2017 0932   CL 104 11/22/2019 1058   CO2 30 11/22/2019 1058   CO2 27 02/03/2017  0932   BUN 20 11/22/2019 1058   BUN 17.7 02/03/2017 0932   CREATININE 1.28  (H) 11/22/2019 1058   CREATININE 1.3 (H) 02/03/2017 0932      Component Value Date/Time   CALCIUM 10.5 (H) 11/22/2019 1058   CALCIUM 9.4 02/03/2017 0932   ALKPHOS 77 11/22/2019 1058   ALKPHOS 42 02/03/2017 0932   AST 24 11/22/2019 1058   AST 19 02/03/2017 0932   ALT 24 11/22/2019 1058   ALT 12 02/03/2017 0932   BILITOT 0.6 11/22/2019 1058   BILITOT 0.37 02/03/2017 0932      No results found for: LABCA2  No components found for: LABCA125  No results for input(s): INR in the last 168 hours.  Urinalysis    Component Value Date/Time   COLORURINE YELLOW 01/04/2017 [place] 01/04/2017 1755   LABSPEC <1.005 (L) 01/04/2017 1755   PHURINE 6.0 01/04/2017 1755   GLUCOSEU NEGATIVE 01/04/2017 1755   HGBUR NEGATIVE 01/04/2017 [place] 01/04/2017 1755   KETONESUR NEGATIVE 01/04/2017 1755   PROTEINUR NEGATIVE 01/04/2017 1755   NITRITE NEGATIVE 01/04/2017 1755   LEUKOCYTESUR TRACE (A) 01/04/2017 1755    STUDIES: No results found.    ASSESSMENT: 79 y.o. BRCA negative [place] woman status post right breast upper outer quadrant biopsy 05/03/2014 for a clinical T1b N0, stage IA invasive ductal carcinoma, grade 1 or 2, estrogen receptor positive, progesterone receptor negative, with an MIB-1 of 12%, and HER-2/neu equivocal  (1) right lumpectomy and sentinel lymph node sampling 05/30/2014 showed a pT1c pN0, stage IA invasive ductal carcinoma, grade 1, repeat HER-2 again negative.  (2) genetics testing 05/17/2014 showed no deleterious mutations in ATM, BARD1, BRCA1, BRCA2, BRIP1, CDH1, CHEK2, MRE11A, MUTYH, NBN, NF1, PALB2, PTEN, RAD50, RAD51C, RAD51D, and TP53. [facility] genetics, [facility] panel)  (3) Oncotype score of 25 predicts a risk of distant recurrence of 16% if the patient's only systemic treatment is tamoxifen for 5 years; it also predicts a benefit from chemotherapy in the 5% range. Given this marginal benefit the patient opted against adjuvant  chemotherapy  (4) adjuvant radiation completed 08/12/2014: Right breast 4250 cGy in 17 sessions, right breast boost 1200 cGy in 6 sessions  (5) started tamoxifen 10/10/2014  (a) status post hysterectomy and bilateral salpingo-oophorectomy in 1972  (b) tamoxifen discontinued February 2021 with development of DVT/PE   (6) significant family breast cancer history, to be treated as if gene positive  (a) intensified screening started October 2020 with breast MRI.  (b) mammogram scheduled for May 2021  (7) bilateral segmental/subsegmental DVTs without heart strain 05/07/2019  (a) rivaroxaban started 05/07/2019  (b) bilateral lower extremity Dopplers 05/08/2019 shows left posterior tibial DVT  (c) hypercoagulable work-up per Dr. Glori Bickers showed negative lupus anticoagulant, normal protein C activity, protein S antigen and antithrombin, negative prothrombin II mutation, negative factor V Leiden mutation and no anticardiolipin, phosphatidylserine or beta-2 glycoprotein antibodies.  Homocystine was elevated at 14.0  (d) CEA, CA-27 both WNL FEB 2021  (e) repeat CT angio 07/12/2019 and nuclear medicine perfusion lung scan 08/17/2019 showed resolution of the earlier clot, no perfusion defects.  (f) D-dimer 05/25/2019 and 08/16/2019 both normal at 0.27  (8) pulmonary hypertension? -- cardiac and pulmonary evaluation in process   PLAN: Chanette is now nearly 6 years out from definitive surgery for her breast cancer with no evidence of disease recurrence.  This is very favorable.  She is close to a year into her rivaroxaban.  The question of course is  whether her left lower extremity DVT and PE were provoked.  She had been on tamoxifen for 5 years prior to that with no complications.  She thinks she may have had Covid at the time but she was tested and was negative.  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  Actually that would be  her preference as well since she tells me she is scared of going off anticoagulation  Cost is an issue.  I asked her to call [facility] and see how much she would have to pay for 90 tablets there versus 3x30 tablets in her current drugstore.  As far as breast cancer is concerned I think she can "graduate" today.  She has follow-up with her primary care doctor Tower and also with cardiology and pulmonology.  I do not think we have much to offer in addition to that.  All she will need in terms of breast cancer follow-up is a yearly mammography, and a yearly physician breast exam.    I will be glad to see her again at any point in the future if and when the need arises but as of now are making no further routine appointments for her here  Total encounter time 25 minutes.*  Delayla Hoffmaster, Virgie Dad, MD  03/11/20 11:31 AM Medical Oncology and Hematology [facility] [place], [place] [address] Tel. [phone]    Fax. [phone]   I, Wilburn Mylar, am acting as scribe for Dr. Virgie Dad. Kyisha Fowle.  I, Lurline Del MD, have reviewed the above documentation for accuracy and completeness, and I agree with the above.   *Total Encounter Time as defined by the Centers for Medicare and Medicaid Services includes, in addition to the face-to-face time of a patient visit (documented in the note above) non-face-to-face time: obtaining and reviewing outside history, ordering and reviewing medications, tests or procedures, care coordination (communications with other health care professionals or caregivers) and documentation in the medical record.

## 2020-03-11 ENCOUNTER — Inpatient Hospital Stay: Payer: Medicare Other

## 2020-03-11 ENCOUNTER — Other Ambulatory Visit: Payer: Self-pay

## 2020-03-11 ENCOUNTER — Inpatient Hospital Stay: Payer: Medicare Other | Attending: Oncology | Admitting: Oncology

## 2020-03-11 VITALS — BP 138/68 | HR 91 | Temp 96.8°F | Resp 18 | Ht 60.0 in | Wt 153.0 lb

## 2020-03-11 DIAGNOSIS — Z86711 Personal history of pulmonary embolism: Secondary | ICD-10-CM | POA: Insufficient documentation

## 2020-03-11 DIAGNOSIS — Z86718 Personal history of other venous thrombosis and embolism: Secondary | ICD-10-CM | POA: Diagnosis not present

## 2020-03-11 DIAGNOSIS — Z923 Personal history of irradiation: Secondary | ICD-10-CM | POA: Diagnosis not present

## 2020-03-11 DIAGNOSIS — Z853 Personal history of malignant neoplasm of breast: Secondary | ICD-10-CM | POA: Diagnosis not present

## 2020-03-11 DIAGNOSIS — Z170 Estrogen receptor positive status [ER+]: Secondary | ICD-10-CM

## 2020-03-11 DIAGNOSIS — C50411 Malignant neoplasm of upper-outer quadrant of right female breast: Secondary | ICD-10-CM | POA: Diagnosis not present

## 2020-03-11 DIAGNOSIS — M8580 Other specified disorders of bone density and structure, unspecified site: Secondary | ICD-10-CM | POA: Diagnosis not present

## 2020-03-11 DIAGNOSIS — Z7901 Long term (current) use of anticoagulants: Secondary | ICD-10-CM | POA: Insufficient documentation

## 2020-03-11 LAB — CBC WITH DIFFERENTIAL (CANCER CENTER ONLY)
Abs Immature Granulocytes: 0.01 10*3/uL (ref 0.00–0.07)
Basophils Absolute: 0.1 10*3/uL (ref 0.0–0.1)
Basophils Relative: 1 %
Eosinophils Absolute: 0.1 10*3/uL (ref 0.0–0.5)
Eosinophils Relative: 1 %
HCT: 43.1 % (ref 36.0–46.0)
Hemoglobin: 13.7 g/dL (ref 12.0–15.0)
Immature Granulocytes: 0 %
Lymphocytes Relative: 29 %
Lymphs Abs: 1.7 10*3/uL (ref 0.7–4.0)
MCH: 29.1 pg (ref 26.0–34.0)
MCHC: 31.8 g/dL (ref 30.0–36.0)
MCV: 91.5 fL (ref 80.0–100.0)
Monocytes Absolute: 0.6 10*3/uL (ref 0.1–1.0)
Monocytes Relative: 10 %
Neutro Abs: 3.6 10*3/uL (ref 1.7–7.7)
Neutrophils Relative %: 59 %
Platelet Count: 223 10*3/uL (ref 150–400)
RBC: 4.71 MIL/uL (ref 3.87–5.11)
RDW: 14.6 % (ref 11.5–15.5)
WBC Count: 6 10*3/uL (ref 4.0–10.5)
nRBC: 0 % (ref 0.0–0.2)

## 2020-03-11 LAB — CMP (CANCER CENTER ONLY)
ALT: 33 U/L (ref 0–44)
AST: 37 U/L (ref 15–41)
Albumin: 3.6 g/dL (ref 3.5–5.0)
Alkaline Phosphatase: 76 U/L (ref 38–126)
Anion gap: 6 (ref 5–15)
BUN: 19 mg/dL (ref 8–23)
CO2: 28 mmol/L (ref 22–32)
Calcium: 9.9 mg/dL (ref 8.9–10.3)
Chloride: 105 mmol/L (ref 98–111)
Creatinine: 1.29 mg/dL — ABNORMAL HIGH (ref 0.44–1.00)
GFR, Estimated: 42 mL/min — ABNORMAL LOW (ref >60)
Glucose, Bld: 83 mg/dL (ref 70–99)
Potassium: 4.1 mmol/L (ref 3.5–5.1)
Sodium: 139 mmol/L (ref 135–145)
Total Bilirubin: 0.7 mg/dL (ref 0.3–1.2)
Total Protein: 7 g/dL (ref 6.5–8.1)

## 2020-03-12 ENCOUNTER — Telehealth: Payer: Self-pay | Admitting: Oncology

## 2020-03-12 NOTE — Telephone Encounter (Signed)
No 12/14 los. No changes made to pt's schedule.  

## 2020-03-26 ENCOUNTER — Telehealth: Payer: Self-pay | Admitting: Family Medicine

## 2020-03-26 MED ORDER — RIVAROXABAN 20 MG PO TABS: 20.0000 mg | ORAL_TABLET | Freq: Every day | ORAL | 3 refills | Status: DC

## 2020-03-26 NOTE — Telephone Encounter (Signed)
No problem  I pended to send to pharmacy of choice Change to 90 days if she pref this  Thanks

## 2020-03-26 NOTE — Telephone Encounter (Signed)
Spouse notified of Dr. Royden Purl comments and Rx sent to pharmacy

## 2020-03-26 NOTE — Telephone Encounter (Signed)
Pt called in wanted to know about switching her Xarelto to Dr.Tower from Dr. Darnelle Catalan due to he discharged her due to she graduated from him. And wanted to know about what to do.  She has enough for a week and a day.

## 2020-06-07 ENCOUNTER — Encounter (HOSPITAL_COMMUNITY): Payer: Self-pay

## 2020-06-07 ENCOUNTER — Emergency Department (HOSPITAL_COMMUNITY): Payer: Medicare Other

## 2020-06-07 ENCOUNTER — Other Ambulatory Visit: Payer: Self-pay

## 2020-06-07 ENCOUNTER — Emergency Department (HOSPITAL_COMMUNITY)
Admission: EM | Admit: 2020-06-07 | Discharge: 2020-06-07 | Disposition: A | Discharge status: Home / Self Care | Payer: Medicare Other | Attending: Emergency Medicine | Admitting: Emergency Medicine

## 2020-06-07 DIAGNOSIS — R112 Nausea with vomiting, unspecified: Secondary | ICD-10-CM | POA: Insufficient documentation

## 2020-06-07 DIAGNOSIS — I499 Cardiac arrhythmia, unspecified: Secondary | ICD-10-CM | POA: Diagnosis not present

## 2020-06-07 DIAGNOSIS — R079 Chest pain, unspecified: Secondary | ICD-10-CM | POA: Diagnosis not present

## 2020-06-07 DIAGNOSIS — Z79899 Other long term (current) drug therapy: Secondary | ICD-10-CM | POA: Diagnosis not present

## 2020-06-07 DIAGNOSIS — Z7901 Long term (current) use of anticoagulants: Secondary | ICD-10-CM | POA: Insufficient documentation

## 2020-06-07 DIAGNOSIS — K449 Diaphragmatic hernia without obstruction or gangrene: Secondary | ICD-10-CM | POA: Diagnosis not present

## 2020-06-07 DIAGNOSIS — R000 Tachycardia, unspecified: Secondary | ICD-10-CM | POA: Diagnosis not present

## 2020-06-07 DIAGNOSIS — R0602 Shortness of breath: Secondary | ICD-10-CM | POA: Diagnosis not present

## 2020-06-07 DIAGNOSIS — R0789 Other chest pain: Secondary | ICD-10-CM

## 2020-06-07 DIAGNOSIS — J9811 Atelectasis: Secondary | ICD-10-CM | POA: Diagnosis not present

## 2020-06-07 DIAGNOSIS — I10 Essential (primary) hypertension: Secondary | ICD-10-CM | POA: Diagnosis not present

## 2020-06-07 DIAGNOSIS — Z743 Need for continuous supervision: Secondary | ICD-10-CM | POA: Diagnosis not present

## 2020-06-07 DIAGNOSIS — R509 Fever, unspecified: Secondary | ICD-10-CM | POA: Insufficient documentation

## 2020-06-07 DIAGNOSIS — Z853 Personal history of malignant neoplasm of breast: Secondary | ICD-10-CM | POA: Diagnosis not present

## 2020-06-07 DIAGNOSIS — Z20822 Contact with and (suspected) exposure to covid-19: Secondary | ICD-10-CM | POA: Diagnosis not present

## 2020-06-07 DIAGNOSIS — R7989 Other specified abnormal findings of blood chemistry: Secondary | ICD-10-CM | POA: Diagnosis not present

## 2020-06-07 DIAGNOSIS — R059 Cough, unspecified: Secondary | ICD-10-CM | POA: Diagnosis not present

## 2020-06-07 DIAGNOSIS — R6889 Other general symptoms and signs: Secondary | ICD-10-CM | POA: Diagnosis not present

## 2020-06-07 LAB — LIPASE, BLOOD: Lipase: 37 U/L (ref 11–51)

## 2020-06-07 LAB — TROPONIN I (HIGH SENSITIVITY)
Troponin I (High Sensitivity): 5 ng/L (ref <18)
Troponin I (High Sensitivity): 6 ng/L (ref <18)

## 2020-06-07 LAB — URINALYSIS, ROUTINE W REFLEX MICROSCOPIC
Bacteria, UA: NONE SEEN
Bilirubin Urine: NEGATIVE
Glucose, UA: NEGATIVE mg/dL
Ketones, ur: NEGATIVE mg/dL
Leukocytes,Ua: NEGATIVE
Nitrite: NEGATIVE
Protein, ur: NEGATIVE mg/dL
Specific Gravity, Urine: >1.046 — ABNORMAL HIGH (ref 1.005–1.030)
pH: 7 (ref 5.0–8.0)

## 2020-06-07 LAB — CBC WITH DIFFERENTIAL/PLATELET
Abs Immature Granulocytes: 0.03 10*3/uL (ref 0.00–0.07)
Basophils Absolute: 0 10*3/uL (ref 0.0–0.1)
Basophils Relative: 0 %
Eosinophils Absolute: 0.1 10*3/uL (ref 0.0–0.5)
Eosinophils Relative: 1 %
HCT: 42 % (ref 36.0–46.0)
Hemoglobin: 13.8 g/dL (ref 12.0–15.0)
Immature Granulocytes: 0 %
Lymphocytes Relative: 4 %
Lymphs Abs: 0.5 10*3/uL — ABNORMAL LOW (ref 0.7–4.0)
MCH: 29.4 pg (ref 26.0–34.0)
MCHC: 32.9 g/dL (ref 30.0–36.0)
MCV: 89.6 fL (ref 80.0–100.0)
Monocytes Absolute: 0.4 10*3/uL (ref 0.1–1.0)
Monocytes Relative: 3 %
Neutro Abs: 11.3 10*3/uL — ABNORMAL HIGH (ref 1.7–7.7)
Neutrophils Relative %: 92 %
Platelets: 224 10*3/uL (ref 150–400)
RBC: 4.69 MIL/uL (ref 3.87–5.11)
RDW: 14.6 % (ref 11.5–15.5)
WBC: 12.3 10*3/uL — ABNORMAL HIGH (ref 4.0–10.5)
nRBC: 0 % (ref 0.0–0.2)

## 2020-06-07 LAB — COMPREHENSIVE METABOLIC PANEL
ALT: 19 U/L (ref 0–44)
AST: 27 U/L (ref 15–41)
Albumin: 3.1 g/dL — ABNORMAL LOW (ref 3.5–5.0)
Alkaline Phosphatase: 60 U/L (ref 38–126)
Anion gap: 8 (ref 5–15)
BUN: 17 mg/dL (ref 8–23)
CO2: 25 mmol/L (ref 22–32)
Calcium: 8.7 mg/dL — ABNORMAL LOW (ref 8.9–10.3)
Chloride: 103 mmol/L (ref 98–111)
Creatinine, Ser: 1.12 mg/dL — ABNORMAL HIGH (ref 0.44–1.00)
GFR, Estimated: 50 mL/min — ABNORMAL LOW (ref >60)
Glucose, Bld: 112 mg/dL — ABNORMAL HIGH (ref 70–99)
Potassium: 4.3 mmol/L (ref 3.5–5.1)
Sodium: 136 mmol/L (ref 135–145)
Total Bilirubin: 0.9 mg/dL (ref 0.3–1.2)
Total Protein: 5.6 g/dL — ABNORMAL LOW (ref 6.5–8.1)

## 2020-06-07 LAB — D-DIMER, QUANTITATIVE: D-Dimer, Quant: 0.94 ug/mL-FEU — ABNORMAL HIGH (ref 0.00–0.50)

## 2020-06-07 LAB — RESP PANEL BY RT-PCR (FLU A&B, COVID) ARPGX2
Influenza A by PCR: NEGATIVE
Influenza B by PCR: NEGATIVE
SARS Coronavirus 2 by RT PCR: NEGATIVE

## 2020-06-07 LAB — POC SARS CORONAVIRUS 2 AG -  ED: SARS Coronavirus 2 Ag: NEGATIVE

## 2020-06-07 MED ORDER — IOHEXOL 350 MG/ML SOLN
75.0000 mL | Freq: Once | INTRAVENOUS | Status: AC | PRN
  Administered 2020-06-07: 75 mL via INTRAVENOUS

## 2020-06-07 MED ORDER — ACETAMINOPHEN 325 MG PO TABS
650.0000 mg | ORAL_TABLET | Freq: Once | ORAL | Status: AC
  Administered 2020-06-07: 650 mg via ORAL
  Filled 2020-06-07: qty 2

## 2020-06-07 MED ORDER — SODIUM CHLORIDE 0.9 % IV BOLUS
500.0000 mL | Freq: Once | INTRAVENOUS | Status: AC
  Administered 2020-06-07: 500 mL via INTRAVENOUS

## 2020-06-07 NOTE — Discharge Instructions (Addendum)
You are seen in the ER because you are feeling unwell.  We have completed a comprehensive work-up that has ruled out heart attack, blood clot, pneumonia, bladder infection, COVID-19 while you were in the ER.  You have developed a low-grade fever, and it is not clear why you have it. We have all agreed upon conservative management with as drawing the blood cultures and sending you home.  You will return to the ER if you start having severe nausea and vomiting, confusion, severe weakness, lightheadedness or near fainting.  The hospital will contact you if your blood cultures grew any bacteria -in which case you will be asked to return to the ER for admission.  If you do not get worse, see your primary care doctor in 3 to 5 days.  Take Tylenol for fever control. Ensure that you are hydrating yourself really well.

## 2020-06-07 NOTE — ED Triage Notes (Signed)
Chest pressure woke her up at 0300 with vomiting, no sweating, pain was across her chest with radiation to upper back, 911 was called at 0928 via daughter, pt states that she did not call 911 because she thought it would go away, cp now a 5 on 10 scale

## 2020-06-07 NOTE — ED Notes (Signed)
Patient verbalizes understanding of discharge instructions. Opportunity for questioning and answers were provided. Armband removed by staff, pt discharged from ED via wheelchair with daughter.

## 2020-06-07 NOTE — ED Provider Notes (Signed)
[facility] Provider Note   CSN: [id number] Arrival date & time: 06/07/20  1027     History Chief Complaint  Patient presents with   Chest Pain    Started at [place] is a 80 y.o. 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  History was obtained by EMS reports that she was given aspirin and 2 sublingual nitroglycerin.  On my examination, patient states that yesterday she felt weak all day.  She has had a mild, nonproductive cough all week.  However, yesterday she just felt completely fatigued and could not even enjoy the [employer] game at her friend [employer].  This morning around 3 AM she developed chest tightness sensation and pain that radiated to her back and right shoulder.  She also felt associated shortness of breath, nausea, and had an episode of nonbloody emesis.    She states that she has continued to take her Xarelto as her cardiologist suspect that she will need to be on it for the rest of her life.  She does feel as though her left leg has started to hurt again, but no significant swelling.  She states that the last time that she felt this way was when she had COVID-19.  I reviewed patient's medical record.  She had a normal D-dimer approximately 6 months ago.  Recent CTA from 2021 was without aneurysmal dilatation or dissection.  HPI     Past Medical History:  Diagnosis Date   Anxiety    Arthritis    OA of hands   Breast cancer of upper-outer quadrant of right female breast ([place]) 05/08/2014   DVT (deep venous thrombosis) ([facility]) 1980s   Fibromyalgia    Hyperlipidemia    Hypertension     Irritable bowel syndrome    Migraine    Osteopenia    Personal history of radiation therapy    Pulmonary embolus ([place]) 2021   S/P radiation therapy 06/2014   Right breast 4250 cGy in 17 sessions, right breast boost 1200 cGy in 6 sessions= 07/11/2014 through 08/12/2014    Vitamin D deficiency    Wears glasses     Patient Active Problem List   Diagnosis Date Noted   Wasp sting 11/19/2019   Dyslipidemia 09/02/2019   Aortic atherosclerosis ([place]) 08/23/2019   Dizziness 07/11/2019   Deep vein thrombosis (DVT) of left lower extremity ([place]) 05/25/2019   Hypercoagulopathy ([place]) 05/22/2019   History of pulmonary embolism 05/07/2019   Anterior chest wall pain 04/06/2019   Chest pain of unknown etiology -low risk for Cardiac Etiology 04/06/2019   SOB (shortness of breath) on exertion 04/06/2019   Osteopenia 05/02/2018   Routine general medical examination at a health care facility 11/21/2016   Estrogen deficiency 11/19/2016   Renal insufficiency 11/19/2016   Pedal edema 12/15/2015   Family history of breast cancer in female 05/17/2014   Malignant neoplasm of upper-outer quadrant of right breast in female, estrogen receptor positive ([place]) 05/08/2014   GERD (gastroesophageal reflux disease) 03/01/2014   Lumbar degenerative disc disease 08/15/2013   Chronic pain of scapula 08/15/2013   Essential hypertension 10/02/2008   Migraine 09/06/2008   Vitamin D deficiency 06/27/2007   HYPERLIPIDEMIA 05/11/2007  Generalized anxiety disorder 05/11/2007   Disorder of bone and cartilage 05/11/2007   H/O poliomyelitis 05/08/2007   OSTEOARTHRITIS, HANDS, BILATERAL 05/08/2007   Fibromyalgia 05/08/2007   URINARY INCONTINENCE, STRESS, MILD 05/08/2007    Past Surgical History:  Procedure Laterality Date   ABDOMINAL HYSTERECTOMY  1972   APPENDECTOMY     BREAST EXCISIONAL BIOPSY Left    BREAST LUMPECTOMY Right 2016   BREAST SURGERY  1995    lumpectomy fibrocystic breast-lt   CATARACT EXTRACTION     both   CHOLECYSTECTOMY  2005   COLONOSCOPY     ovarian cyst removed  [facility] SENTINEL LYMPH NODE BIOPSY Right 05/30/2014   Procedure: RADIOACTIVE SEED GUIDED PARTIAL MASTECTOMY WITH AXILLARY SENTINEL LYMPH NODE BIOPSY;  Surgeon: Autumn Messing III, MD;  Location: [facility];  Service: General;  Laterality: Right;     OB History   No obstetric history on file.     Family History  Problem Relation Age of Onset   Hypertension Father    Cancer Father 90       esophageal - smoker   Leukemia Mother    Cancer Mother 47       leukemia   Cancer Brother 87       breast CA   Breast cancer Brother 25   Cancer Maternal Aunt 54       leukemia   Cancer Paternal Aunt 78       breast   Breast cancer Paternal Aunt 72   Breast cancer Maternal Grandmother 40   Breast cancer Sister 14    Social History   Tobacco Use   Smoking status: Never Smoker   Smokeless tobacco: Never Used   Tobacco comment: non smoker  Vaping Use   Vaping Use: Never used  Substance Use Topics   Alcohol use: No    Alcohol/week: 0.0 standard drinks   Drug use: No    Home Medications Prior to Admission medications   Medication Sig Start Date End Date Taking? Authorizing Provider  amitriptyline (ELAVIL) 10 MG tablet TAKE 2 TABLETS (20 MG TOTAL) BY MOUTH AT BEDTIME. 08/13/19   Tower, Wynelle Fanny, MD  amLODipine (NORVASC) 5 MG tablet TAKE 1 TABLET BY MOUTH DAILY. 02/18/20   Tower, Wynelle Fanny, MD  Ascorbic Acid (VITAMIN C) 100 MG tablet Take 100 mg by mouth daily.    [provider]  calcium carbonate 200 MG capsule Take 250 mg by mouth 2 (two) times daily.    [provider]  Cholecalciferol (VITAMIN D PO) Take 1 tablet by mouth 2 (two) times daily. Pt takes 2000 units daily    [provider]  ketoconazole (NIZORAL) 2 % cream Apply 1 application  topically daily. 11/27/19   Magrinat, Virgie Dad, MD  rivaroxaban (XARELTO) 20 MG TABS tablet Take 1 tablet (20 mg total) by mouth daily with supper. 03/26/20   Tower, Wynelle Fanny, MD  SUMAtriptan (IMITREX) 100 MG tablet TAKE 1 TABLET BY MOUTH AS NEEDED FOR MIGRAINE, MAY REPEAT IN 2 HOURS IF NEEDED. MAX 2 TABS IN 24 HOURS. 01/17/20   Tower, Wynelle Fanny, MD  triamcinolone cream (KENALOG) 0.1 % Apply 1 application topically 2 (two) times daily. To affected area (insect sting) 11/19/19   Tower, Wynelle Fanny, MD    Allergies    Amoxicillin, Amoxicillin-pot clavulanate, Esomeprazole magnesium, Gabapentin, Omeprazole, and Ranitidine hcl  Review of Systems   Review of Systems  Physical Exam Updated Vital  Signs BP (!) 133/53    Pulse (!) 103    Temp 100.3 F (37.9 C) (Oral)    Resp 16    Ht 5\' 4"  (1.626 m)    Wt 68 kg    SpO2 95%    BMI 25.75 kg/m   Physical Exam  ED Results / Procedures / Treatments   Labs (all labs ordered are listed, but only abnormal results are displayed) Labs Reviewed  CBC WITH DIFFERENTIAL/PLATELET - Abnormal; Notable for the following components:      Result Value   WBC 12.3 (*)    Neutro Abs 11.3 (*)    Lymphs Abs 0.5 (*)    All other components within normal limits  COMPREHENSIVE METABOLIC PANEL - Abnormal; Notable for the following components:   Glucose, Bld 112 (*)    Creatinine, Ser 1.12 (*)    Calcium 8.7 (*)    Total Protein 5.6 (*)    Albumin 3.1 (*)    GFR, Estimated 50 (*)    All other components within normal limits  D-DIMER, QUANTITATIVE - Abnormal; Notable for the following components:   D-Dimer, Quant 0.94 (*)    All other components within normal limits  URINALYSIS, ROUTINE W REFLEX MICROSCOPIC - Abnormal; Notable for the following components:   Specific Gravity, Urine >1.046 (*)    Hgb urine dipstick SMALL (*)    All other components within normal limits  RESP PANEL BY RT-PCR (FLU A&B, COVID) ARPGX2  LIPASE, BLOOD  POC SARS CORONAVIRUS 2 AG -  ED   TROPONIN I (HIGH SENSITIVITY)  TROPONIN I (HIGH SENSITIVITY)    EKG EKG Interpretation  Date/Time:  Saturday June 07 2020 13:46:37 EST Ventricular Rate:  98 PR Interval:    QRS Duration: 90 QT Interval:  323 QTC Calculation: 413 R Axis:   10 Text Interpretation: Sinus rhythm Prolonged PR interval Low voltage, precordial leads Borderline T abnormalities, anterior leads No acute changes Confirmed by Varney Biles [phone]) on 06/07/2020 2:34:36 PM   Radiology CT Angio Chest PE W/Cm &/Or Wo Cm  Result Date: 06/07/2020 CLINICAL DATA:  Shortness of breath with positive D-dimer EXAM: CT ANGIOGRAPHY CHEST WITH CONTRAST TECHNIQUE: Multidetector CT imaging of the chest was performed using the standard protocol during bolus administration of intravenous contrast. Multiplanar CT image reconstructions and MIPs were obtained to evaluate the vascular anatomy. CONTRAST:  57mL OMNIPAQUE IOHEXOL 350 MG/ML SOLN COMPARISON:  Chest radiograph June 07, 2020; CT chest July 12, 2019. FINDINGS: Cardiovascular: There is no demonstrable pulmonary embolus. There is no evident thoracic aortic aneurysm or dissection. Visualized great vessels appear unremarkable. There is aortic atherosclerosis. There is no appreciable pericardial effusion or pericardial thickening. Mediastinum/Nodes: Thyroid appears normal. No evident thoracic adenopathy. There is a small hiatal hernia. Lungs/Pleura: There is stable slight fibrosis in the periphery of the right upper lobe. There is mild bibasilar atelectasis. There is no appreciable edema or airspace opacity. No pneumothorax. No pleural effusions. Trachea and major bronchial structures appear patent. Upper Abdomen: Gallbladder absent. There is a cyst arising from the upper pole of the left kidney measuring 1.4 x 1.3 cm. Visualized upper abdominal structures otherwise appear normal. Musculoskeletal: No blastic or lytic bone lesions. Old healed rib fractures noted on the right.  Postoperative change right breast. Review of the MIP images confirms the above findings. IMPRESSION: 1. No evident pulmonary embolus. No thoracic aortic aneurysm or dissection. There is aortic atherosclerosis. 2. Mild fibrosis in the periphery of the right upper lobe, stable. Mild bibasilar  atelectasis. No edema or airspace opacity. No pleural effusions. 3.  No adenopathy evident. 4.  Small hiatal hernia. 5.  Gallbladder absent. Aortic Atherosclerosis (ICD10-I70.0). Electronically Signed   By: Lowella Grip III M.D.   On: 06/07/2020 13:22   DG Chest Portable 1 View  Result Date: 06/07/2020 CLINICAL DATA:  Cough and shortness of breath.  Chest pain. EXAM: PORTABLE CHEST 1 VIEW COMPARISON:  Aug 17, 2019 FINDINGS: Lungs are clear. Heart size and pulmonary vascularity are normal. No adenopathy. No pneumothorax. Surgical clips noted over right breast. IMPRESSION: No edema or airspace opacity. Cardiac silhouette within normal limits. Electronically Signed   By: Lowella Grip III M.D.   On: 06/07/2020 11:03    Procedures Procedures   Medications Ordered in ED Medications  acetaminophen (TYLENOL) tablet 650 mg (has no administration in time range)  sodium chloride 0.9 % bolus 500 mL (has no administration in time range)  iohexol (OMNIPAQUE) 350 MG/ML injection 75 mL (75 mLs Intravenous Contrast Given 06/07/20 1310)    ED Course  I have reviewed the triage vital signs and the nursing notes.  Pertinent labs & imaging results that were available during my care of the patient were reviewed by me and considered in my medical decision making (see chart for details).    MDM Rules/Calculators/A&P                          [facility] was evaluated in Emergency Department on 06/07/2020 for the symptoms described in the history of present illness. She was evaluated in the context of the global COVID-19 pandemic, which necessitated consideration that the patient might be at risk for infection  with the SARS-CoV-2 virus that causes COVID-19. Institutional protocols and algorithms that pertain to the evaluation of patients at risk for COVID-19 are in a state of rapid change based on information released by regulatory bodies including the CDC and federal and state organizations. These policies and algorithms were followed during the patient's care in the ED.  I personally reviewed patient's medical chart and all notes from triage and staff during today's encounter. I have also ordered and reviewed all labs and imaging that I felt to be medically necessary in the evaluation of this patient's complaints and with consideration of their physical exam. If needed, translation services were available and utilized.   Patient with history of DVT and pulmonary embolism currently on Xarelto presented to the ED with sudden onset chest tightness that radiated to her back that woke her up from sleep at 3 AM this morning.  She is tachycardic and her D-dimer is elevated 0.94, up from 0.27 in labs obtained 6 months ago.  Her troponin is trended x2 and flat.  Her x-ray is without pulmonary edema or focal opacities.  No acute cardiopulmonary disease.  We will proceed with CTA for further evaluation, specifically to assess for pulmonary embolism.  CTA PE study is without any evident pulmonary embolus or thoracic aortic aneurysm or dissection.  No edema or airspace opacities noted.  On subsequent evaluation, patient feels as though her chest tightness has improved, however she is still feeling weak and is tachycardic.  Antigen COVID-19 test is negative.  She spiked a low-grade fever of 100.3 F and is continuing to be tachycardic in low 100s.  Will provide a dose of Tylenol and 500 cc bolus.  Will obtain 2-hour PCR COVID-19 and influenza test.  If on subsequent evaluation and COVID-19/influenza test is  negative, if she is still feeling weak and is persistently tachycardic, will need to consider initiating sepsis  work-up including blood cultures and admission to the hospital for unknown source.  UA still pending at time of handoff.  At shift change care was transferred to Margarita Mail, PA-C who will follow pending studies, re-evaluate, and determine disposition.     Final Clinical Impression(s) / ED Diagnoses Final diagnoses:  Chest tightness    Rx / DC Orders ED Discharge Orders    None       Corena Herter, PA-C 06/07/20 Bynum, Ankit, MD 06/07/20 1921

## 2020-06-09 ENCOUNTER — Emergency Department (HOSPITAL_COMMUNITY): Payer: Medicare Other

## 2020-06-09 ENCOUNTER — Encounter (HOSPITAL_COMMUNITY): Payer: Self-pay | Admitting: Emergency Medicine

## 2020-06-09 ENCOUNTER — Telehealth: Payer: Self-pay | Admitting: *Deleted

## 2020-06-09 ENCOUNTER — Other Ambulatory Visit: Payer: Self-pay

## 2020-06-09 ENCOUNTER — Emergency Department (HOSPITAL_COMMUNITY)
Admission: EM | Admit: 2020-06-09 | Discharge: 2020-06-09 | Disposition: A | Discharge status: Home / Self Care | Payer: Medicare Other | Attending: Emergency Medicine | Admitting: Emergency Medicine

## 2020-06-09 DIAGNOSIS — Z853 Personal history of malignant neoplasm of breast: Secondary | ICD-10-CM | POA: Insufficient documentation

## 2020-06-09 DIAGNOSIS — Z7901 Long term (current) use of anticoagulants: Secondary | ICD-10-CM | POA: Diagnosis not present

## 2020-06-09 DIAGNOSIS — Z20822 Contact with and (suspected) exposure to covid-19: Secondary | ICD-10-CM | POA: Insufficient documentation

## 2020-06-09 DIAGNOSIS — N390 Urinary tract infection, site not specified: Secondary | ICD-10-CM | POA: Diagnosis not present

## 2020-06-09 DIAGNOSIS — R509 Fever, unspecified: Secondary | ICD-10-CM

## 2020-06-09 DIAGNOSIS — R319 Hematuria, unspecified: Secondary | ICD-10-CM

## 2020-06-09 DIAGNOSIS — R531 Weakness: Secondary | ICD-10-CM | POA: Diagnosis not present

## 2020-06-09 DIAGNOSIS — I10 Essential (primary) hypertension: Secondary | ICD-10-CM | POA: Diagnosis not present

## 2020-06-09 DIAGNOSIS — R079 Chest pain, unspecified: Secondary | ICD-10-CM | POA: Diagnosis not present

## 2020-06-09 DIAGNOSIS — Z79899 Other long term (current) drug therapy: Secondary | ICD-10-CM | POA: Diagnosis not present

## 2020-06-09 DIAGNOSIS — R9431 Abnormal electrocardiogram [ECG] [EKG]: Secondary | ICD-10-CM | POA: Diagnosis not present

## 2020-06-09 LAB — URINALYSIS, ROUTINE W REFLEX MICROSCOPIC
Bilirubin Urine: NEGATIVE
Glucose, UA: NEGATIVE mg/dL
Ketones, ur: 20 mg/dL — AB
Nitrite: NEGATIVE
Protein, ur: NEGATIVE mg/dL
Specific Gravity, Urine: 1.004 — ABNORMAL LOW (ref 1.005–1.030)
pH: 6 (ref 5.0–8.0)

## 2020-06-09 LAB — HEPATIC FUNCTION PANEL
ALT: 20 U/L (ref 0–44)
AST: 32 U/L (ref 15–41)
Albumin: 2.9 g/dL — ABNORMAL LOW (ref 3.5–5.0)
Alkaline Phosphatase: 53 U/L (ref 38–126)
Bilirubin, Direct: 0.1 mg/dL (ref 0.0–0.2)
Indirect Bilirubin: 0.8 mg/dL (ref 0.3–0.9)
Total Bilirubin: 0.9 mg/dL (ref 0.3–1.2)
Total Protein: 5.5 g/dL — ABNORMAL LOW (ref 6.5–8.1)

## 2020-06-09 LAB — BASIC METABOLIC PANEL
Anion gap: 12 (ref 5–15)
BUN: 17 mg/dL (ref 8–23)
CO2: 23 mmol/L (ref 22–32)
Calcium: 9 mg/dL (ref 8.9–10.3)
Chloride: 102 mmol/L (ref 98–111)
Creatinine, Ser: 1.27 mg/dL — ABNORMAL HIGH (ref 0.44–1.00)
GFR, Estimated: 43 mL/min — ABNORMAL LOW (ref >60)
Glucose, Bld: 88 mg/dL (ref 70–99)
Potassium: 3.5 mmol/L (ref 3.5–5.1)
Sodium: 137 mmol/L (ref 135–145)

## 2020-06-09 LAB — CBC
HCT: 42.2 % (ref 36.0–46.0)
Hemoglobin: 13.6 g/dL (ref 12.0–15.0)
MCH: 29.4 pg (ref 26.0–34.0)
MCHC: 32.2 g/dL (ref 30.0–36.0)
MCV: 91.1 fL (ref 80.0–100.0)
Platelets: 210 10*3/uL (ref 150–400)
RBC: 4.63 MIL/uL (ref 3.87–5.11)
RDW: 14.6 % (ref 11.5–15.5)
WBC: 6.7 10*3/uL (ref 4.0–10.5)
nRBC: 0 % (ref 0.0–0.2)

## 2020-06-09 LAB — RESP PANEL BY RT-PCR (FLU A&B, COVID) ARPGX2
Influenza A by PCR: NEGATIVE
Influenza B by PCR: NEGATIVE
SARS Coronavirus 2 by RT PCR: NEGATIVE

## 2020-06-09 LAB — TROPONIN I (HIGH SENSITIVITY)
Troponin I (High Sensitivity): 6 ng/L (ref <18)
Troponin I (High Sensitivity): 7 ng/L (ref <18)

## 2020-06-09 LAB — LIPASE, BLOOD: Lipase: 32 U/L (ref 11–51)

## 2020-06-09 MED ORDER — SODIUM CHLORIDE 0.9 % IV BOLUS
1000.0000 mL | Freq: Once | INTRAVENOUS | Status: AC
  Administered 2020-06-09: 1000 mL via INTRAVENOUS

## 2020-06-09 MED ORDER — CEPHALEXIN 500 MG PO CAPS: 500.0000 mg | ORAL_CAPSULE | Freq: Four times a day (QID) | ORAL | 0 refills | Status: DC

## 2020-06-09 MED ORDER — CEPHALEXIN 250 MG PO CAPS: 500.0000 mg | ORAL_CAPSULE | Freq: Once | ORAL | Status: DC

## 2020-06-09 NOTE — ED Triage Notes (Signed)
Patient sent from Dr. Patient was seen here this weekend for cardiac workup. Patient still not feeling well, generally weak and chest tightness. VSS.

## 2020-06-09 NOTE — Telephone Encounter (Signed)
Aware, thanks  I will watch for correspondence 

## 2020-06-09 NOTE — ED Provider Notes (Signed)
[facility] EMERGENCY DEPARTMENT Provider Note   CSN: [id number] Arrival date & time: 06/09/20  1002     History Chief Complaint  Patient presents with  . Chest Pain  . Weakness    Sarah Young is a 80 y.o. female.  HPI    80 year old female history of DVT, history of breast cancer, presents today with generalized weakness.  She states that she symptoms began on Saturday morning.  She felt generally weak.  She became nauseated and vomited one time.  She has had ongoing poor appetite since then.  She has had some nausea but no repeat vomiting.  She was seen here in the emergency department had evaluation with labs, CTA, urinalysis, and blood cultures.  She reports that at that time she was offered admission but declined.  She is continue to feel very weak at home.  She had fever here in the emergency department on her last visit that went up to 100.3.  She has had ongoing fever and chills but has not taken her temperature at home.  She has had Covid twice and has had vaccines but has not had her booster.  She has had some mild cough.  She feels slight chest tightness that she reports is having been present since her Covid infection in September.  This has not worsened but in fact has improved slightly.  She has had nauseated but denies having specific abdominal pain. Past Medical History:  Diagnosis Date  . Anxiety   . Arthritis    OA of hands  . Breast cancer of upper-outer quadrant of right female breast ([place]) 05/08/2014  . DVT (deep venous thrombosis) ([place]) 1980s  . Fibromyalgia   . Hyperlipidemia   . Hypertension   . Irritable bowel syndrome   . Migraine   . Osteopenia   . Personal history of radiation therapy   . Pulmonary embolus ([place]) 2021  . S/P radiation therapy 06/2014   Right breast 4250 cGy in 17 sessions, right breast boost 1200 cGy in 6 sessions= 07/11/2014 through 08/12/2014   . Vitamin D deficiency   . Wears  glasses     Patient Active Problem List   Diagnosis Date Noted  . Wasp sting 11/19/2019  . Dyslipidemia 09/02/2019  . Aortic atherosclerosis ([place]) 08/23/2019  . Dizziness 07/11/2019  . Deep vein thrombosis (DVT) of left lower extremity ([place]) 05/25/2019  . Hypercoagulopathy ([place]) 05/22/2019  . History of pulmonary embolism 05/07/2019  . Anterior chest wall pain 04/06/2019  . Chest pain of unknown etiology -low risk for Cardiac Etiology 04/06/2019  . SOB (shortness of breath) on exertion 04/06/2019  . Osteopenia 05/02/2018  . Routine general medical examination at a health care facility 11/21/2016  . Estrogen deficiency 11/19/2016  . Renal insufficiency 11/19/2016  . Pedal edema 12/15/2015  . Family history of breast cancer in female 05/17/2014  . Malignant neoplasm of upper-outer quadrant of right breast in female, estrogen receptor positive ([place]) 05/08/2014  . GERD (gastroesophageal reflux disease) 03/01/2014  . Lumbar degenerative disc disease 08/15/2013  . Chronic pain of scapula 08/15/2013  . Essential hypertension 10/02/2008  . Migraine 09/06/2008  . Vitamin D deficiency 06/27/2007  . HYPERLIPIDEMIA 05/11/2007  . Generalized anxiety disorder 05/11/2007  . Disorder of bone and cartilage 05/11/2007  . H/O poliomyelitis 05/08/2007  . OSTEOARTHRITIS, HANDS, BILATERAL 05/08/2007  . Fibromyalgia 05/08/2007  . URINARY INCONTINENCE, STRESS, MILD 05/08/2007    Past Surgical History:  Procedure Laterality Date  . ABDOMINAL HYSTERECTOMY  [place]    . BREAST EXCISIONAL BIOPSY Left   . BREAST LUMPECTOMY Right 2016  . BREAST SURGERY  1995   lumpectomy fibrocystic breast-lt  . CATARACT EXTRACTION     both  . CHOLECYSTECTOMY  2005  . COLONOSCOPY    . ovarian cyst removed  1985  . RADIOACTIVE SEED GUIDED PARTIAL MASTECTOMY WITH AXILLARY SENTINEL LYMPH NODE BIOPSY Right 05/30/2014   Procedure: RADIOACTIVE SEED GUIDED PARTIAL MASTECTOMY WITH AXILLARY SENTINEL LYMPH NODE  BIOPSY;  Surgeon: Autumn Messing III, MD;  Location: [facility];  Service: General;  Laterality: Right;     OB History   No obstetric history on file.     Family History  Problem Relation Age of Onset  . Hypertension Father   . Cancer Father 28       esophageal - smoker  . Leukemia Mother   . Cancer Mother 38       leukemia  . Cancer Brother 11       breast CA  . Breast cancer Brother 72  . Cancer Maternal Aunt 80       leukemia  . Cancer Paternal Aunt 75       breast  . Breast cancer Paternal Aunt 44  . Breast cancer Maternal Grandmother 80  . Breast cancer Sister 90    Social History   Tobacco Use  . Smoking status: Never Smoker  . Smokeless tobacco: Never Used  . Tobacco comment: non smoker  Vaping Use  . Vaping Use: Never used  Substance Use Topics  . Alcohol use: No    Alcohol/week: 0.0 standard drinks  . Drug use: No    Home Medications Prior to Admission medications   Medication Sig Start Date End Date Taking? Authorizing Provider  acetaminophen (TYLENOL) 500 MG tablet Take 1,000 mg by mouth every 6 (six) hours as needed for headache (pain).    [provider]  amitriptyline (ELAVIL) 10 MG tablet TAKE 2 TABLETS (20 MG TOTAL) BY MOUTH AT BEDTIME. Patient taking differently: Take 20 mg by mouth at bedtime. 08/13/19   Tower, Wynelle Fanny, MD  amLODipine (NORVASC) 5 MG tablet TAKE 1 TABLET BY MOUTH DAILY. Patient taking differently: Take 5 mg by mouth every morning. 02/18/20   Tower, Wynelle Fanny, MD  Ascorbic Acid (VITAMIN C PO) Take 1 tablet by mouth every morning.    [provider]  Calcium Carbonate-Vitamin D (CALCIUM-D PO) Take 1 tablet by mouth 2 (two) times daily.    [provider]  Cholecalciferol (VITAMIN D PO) Take 1 tablet by mouth every morning.    [provider]  Cyanocobalamin (VITAMIN B-12 PO) Take 1 tablet by mouth every morning.    [provider]  ketoconazole (NIZORAL) 2 % cream Apply 1  application topically daily. Patient not taking: No sig reported 11/27/19   Magrinat, Virgie Dad, MD  Multiple Vitamin (MULTIVITAMIN WITH MINERALS) TABS tablet Take 1 tablet by mouth every morning.    [provider]  Multiple Vitamins-Minerals (ZINC PO) Take 1 tablet by mouth every morning.    [provider]  Polyethyl Glycol-Propyl Glycol (SYSTANE) 0.4-0.3 % SOLN Place 1 drop into both eyes 2 (two) times daily.    [provider]  rivaroxaban (XARELTO) 20 MG TABS tablet Take 1 tablet (20 mg total) by mouth daily with supper. 03/26/20   Tower, Wynelle Fanny, MD  SUMAtriptan (IMITREX) 100 MG tablet TAKE 1 TABLET BY MOUTH AS NEEDED FOR MIGRAINE, MAY  REPEAT IN 2 HOURS IF NEEDED. MAX 2 TABS IN 24 HOURS. Patient taking differently: Take 100 mg by mouth See admin instructions. Take one tablet (100 mg) by mouth at onset of migraine headache, may repeat in 2 hours if still needed. Max 2 tablets in 24 hours 01/17/20   Tower, Roque Lias A, MD  triamcinolone cream (KENALOG) 0.1 % Apply 1 application topically 2 (two) times daily. To affected area (insect sting) Patient not taking: No sig reported 11/19/19   Tower, Wynelle Fanny, MD    Allergies    Amoxicillin, Amoxicillin-pot clavulanate, Esomeprazole magnesium, Gabapentin, Omeprazole, and Ranitidine hcl  Review of Systems   Review of Systems  Constitutional: Positive for activity change, appetite change, chills and fever.  HENT: Negative.   Eyes: Negative.   Respiratory: Positive for cough.   Cardiovascular: Negative.   Gastrointestinal: Positive for nausea and vomiting.  Endocrine: Negative.   Genitourinary: Negative.  Negative for dysuria and frequency.  Musculoskeletal: Negative.   Skin: Negative.   Allergic/Immunologic: Negative.   Neurological: Negative.   Hematological: Negative.   Psychiatric/Behavioral: Negative.   All other systems reviewed and are negative.   Physical Exam Updated Vital Signs BP 135/71 (BP Location: Left  Arm)   Pulse 90   Temp 97.9 F (36.6 C)   Resp 16   SpO2 100%   Physical Exam Vitals and nursing note reviewed.  Constitutional:      Appearance: She is well-developed.  HENT:     Head: Normocephalic.  Eyes:     Pupils: Pupils are equal, round, and reactive to light.  Cardiovascular:     Rate and Rhythm: Normal rate.     Heart sounds: Normal heart sounds.  Pulmonary:     Effort: Pulmonary effort is normal.     Breath sounds: Normal breath sounds.  Abdominal:     General: Bowel sounds are normal.     Palpations: Abdomen is soft.  Musculoskeletal:        General: Normal range of motion.     Cervical back: Normal range of motion.  Skin:    Capillary Refill: Capillary refill takes less than 2 seconds.  Neurological:     Mental Status: She is alert.     ED Results / Procedures / Treatments   Labs (all labs ordered are listed, but only abnormal results are displayed) Labs Reviewed  CBC  BASIC METABOLIC PANEL  HEPATIC FUNCTION PANEL  LIPASE, BLOOD  TROPONIN I (HIGH SENSITIVITY)    EKG EKG Interpretation  Date/Time:  Monday June 09 2020 10:13:08 EDT Ventricular Rate:  82 PR Interval:  178 QRS Duration: 76 QT Interval:  362 QTC Calculation: 422 R Axis:   13 Text Interpretation: Normal sinus rhythm Low voltage QRS Cannot rule out Anterior infarct , age undetermined Abnormal ECG Confirmed by Pattricia Boss [phone]) on 06/09/2020 11:06:03 AM   Radiology DG Chest 2 View  Result Date: 06/09/2020 CLINICAL DATA:  Chest pain. EXAM: CHEST - 2 VIEW COMPARISON:  June 07, 2020. FINDINGS: The heart size and mediastinal contours are within normal limits. Both lungs are clear. No pneumothorax or pleural effusion is noted. The visualized skeletal structures are unremarkable. IMPRESSION: No active cardiopulmonary disease. Electronically Signed   By: Marijo Conception M.D.   On: 06/09/2020 10:56   CT Angio Chest PE W/Cm &/Or Wo Cm  Result Date: 06/07/2020 CLINICAL DATA:  Shortness  of breath with positive D-dimer EXAM: CT ANGIOGRAPHY CHEST WITH CONTRAST TECHNIQUE: Multidetector CT imaging of the chest was  performed using the standard protocol during bolus administration of intravenous contrast. Multiplanar CT image reconstructions and MIPs were obtained to evaluate the vascular anatomy. CONTRAST:  84mL OMNIPAQUE IOHEXOL 350 MG/ML SOLN COMPARISON:  Chest radiograph June 07, 2020; CT chest July 12, 2019. FINDINGS: Cardiovascular: There is no demonstrable pulmonary embolus. There is no evident thoracic aortic aneurysm or dissection. Visualized great vessels appear unremarkable. There is aortic atherosclerosis. There is no appreciable pericardial effusion or pericardial thickening. Mediastinum/Nodes: Thyroid appears normal. No evident thoracic adenopathy. There is a small hiatal hernia. Lungs/Pleura: There is stable slight fibrosis in the periphery of the right upper lobe. There is mild bibasilar atelectasis. There is no appreciable edema or airspace opacity. No pneumothorax. No pleural effusions. Trachea and major bronchial structures appear patent. Upper Abdomen: Gallbladder absent. There is a cyst arising from the upper pole of the left kidney measuring 1.4 x 1.3 cm. Visualized upper abdominal structures otherwise appear normal. Musculoskeletal: No blastic or lytic bone lesions. Old healed rib fractures noted on the right. Postoperative change right breast. Review of the MIP images confirms the above findings. IMPRESSION: 1. No evident pulmonary embolus. No thoracic aortic aneurysm or dissection. There is aortic atherosclerosis. 2. Mild fibrosis in the periphery of the right upper lobe, stable. Mild bibasilar atelectasis. No edema or airspace opacity. No pleural effusions. 3.  No adenopathy evident. 4.  Small hiatal hernia. 5.  Gallbladder absent. Aortic Atherosclerosis (ICD10-I70.0). Electronically Signed   By: Lowella Grip III M.D.   On: 06/07/2020 13:22    Procedures Procedures    Medications Ordered in ED Medications  sodium chloride 0.9 % bolus 1,000 mL (has no administration in time range)    ED Course  I have reviewed the triage vital signs and the nursing notes.  Pertinent labs & imaging results that were available during my care of the patient were reviewed by me and considered in my medical decision making (see chart for details).    MDM Rules/Calculators/A&P                          80 year old female presents today with generalized malaise and fevers.  She was seen here 2 days ago.  At that time temperature 100.3.  She has felt like she has had ongoing fever at home but has not been taking her temperature.  Here today she is afebrile.  Her previous visit she complained of some chest discomfort and dyspnea and CT angiogram EKG were obtained.  Today chest pain dyspnea nausea limited by none of her complaints.  However, repeat EKG and chest x-Levon Penning were obtained showing evidence of acute abnormality.  Labs obtained within normal limits including electrolytes and CBC.  Urinalysis obtained significant for 6-10 red blood cells, 1-23 red cells, 21-50 white blood cells,   It is leukocyte positive but nitrite negative.  She is not having specific urinary symptoms.  Plan culture urine.  She will be started on Keflex.  Patient had blood cultures obtained on previous visit notes reviewed and are not completed but have not become positive.  Respiratory panel obtained is negative for flu a flu B and Covid.  Patient is taking p.o. here without difficulty.  We discussed the need for her to continue to push oral fluids.  She is to call her primary care doctor in follow-up.  She is given return precautions and voices understanding. Final Clinical Impression(s) / ED Diagnoses Final diagnoses:  Weakness  Febrile illness  Urinary tract  infection with hematuria, site unspecified    Rx / DC Orders ED Discharge Orders    None       Pattricia Boss, MD 06/09/20 1555

## 2020-06-09 NOTE — ED Notes (Signed)
Lab called and reports insufficient sample for UA. Will recollect

## 2020-06-09 NOTE — Discharge Instructions (Addendum)
Your labs were mostly within normal limits.  There is no definite source of your infection found.  However, your urine has some white blood cells and red blood cells in it.  It will be cultured.  You are being placed on antibiotics for this.  Please follow-up with your doctor. Please return to the emergency department if you are having worsening symptoms especially high fevers increased pain, or inability to tolerate fluids.

## 2020-06-09 NOTE — Telephone Encounter (Signed)
Patient's daughter Suanne Marker called stating that her mom was taken to the ER Saturday.Suanne Marker stated that her mom has a history of blood clots. Patient's daughter stated that most of her test came back negative but they are still waiting on some blood culture test results. Patient's daughter stated that her mom seemed to be worse yesterday and not any better today.Suanne Marker stated that her mom is still real nauseated, weak, having chills, body aches and dry mouth. Suanne Marker stated that her mom hasn't been able to eat but is able to drink fluids. Suanne Marker  denies that her mom is having any chest pain at this time.  Suanne Marker stated that they offered to keep her mom at the hospital but they decided not to have her admitted   Marylou Mccoy that she should take her mom back to the ER. Called and advised Ryan triage nurse at [facility]  that the patient is on her way back to the ER and he verbalized understanding.

## 2020-06-09 NOTE — ED Notes (Signed)
Called lab to have hepatic function and lipase added

## 2020-06-12 ENCOUNTER — Ambulatory Visit (INDEPENDENT_AMBULATORY_CARE_PROVIDER_SITE_OTHER): Payer: Medicare Other | Admitting: Family Medicine

## 2020-06-12 ENCOUNTER — Other Ambulatory Visit: Payer: Self-pay

## 2020-06-12 ENCOUNTER — Encounter: Payer: Self-pay | Admitting: Family Medicine

## 2020-06-12 DIAGNOSIS — N390 Urinary tract infection, site not specified: Secondary | ICD-10-CM

## 2020-06-12 DIAGNOSIS — R319 Hematuria, unspecified: Secondary | ICD-10-CM

## 2020-06-12 LAB — CULTURE, BLOOD (ROUTINE X 2)
Culture: NO GROWTH
Culture: NO GROWTH
Special Requests: ADEQUATE

## 2020-06-12 NOTE — Patient Instructions (Addendum)
Finish the antibiotics.  Keep drinking plenty of water.   If you are lightheaded, then skip amlodipine for that day.  Restart when feeling better and if BP is above 130/90.   If you get worse, then call the on call line at [phone].  Take care.  Glad to see you.

## 2020-06-12 NOTE — Progress Notes (Signed)
This visit occurred during the SARS-CoV-2 public health emergency.  Safety protocols were in place, including screening questions prior to the visit, additional usage of staff PPE, and extensive cleaning of exam room while observing appropriate contact time as indicated for disinfecting solutions.  ER f/u.  Presented with malaise.  Had been feeling poorly since 06/07/20.  Vomiting initially, with chest tightness, to ER that day via EMS.    CT with: 1. No evident pulmonary embolus. No thoracic aortic aneurysm or dissection. There is aortic atherosclerosis. 2. Mild fibrosis in the periphery of the right upper lobe, stable. Mild bibasilar atelectasis. No edema or airspace opacity. No pleural effusions. 3.  No adenopathy evident. 4.  Small hiatal hernia. 5.  Gallbladder absent. Aortic Atherosclerosis (ICD10-I70.0).  She opted for discharge.  Then 06/09/20, still felt weak.  Abnormal U/a noted. Started on keflex for presumed UTI.    She feels better compared to Monday, "not 100% but much better, still a little weak but nothing like I was on Monday."  No recent h/o UTI other than this episode.  Dysuria with frequency and burning started last week, improved in the meantime.  She is taking fluids well.  Still a little lightheaded on standing.  See avs re: amlodipine.  No CP.  Not SOB acutely but she noted chronic changes after covid.  No fevers.     Lives with husband.  Has family support.    Meds, vitals, and allergies reviewed.   ROS: Per HPI unless specifically indicated in ROS section   GEN: nad, alert and oriented HEENT:  ncat NECK: supple w/o LA CV: rrr PULM: ctab, no inc wob ABD: soft, +bs, abd minimally ttp, improved per patient report. No rebound.   EXT: no edema SKIN: Well-perfused.  30 minutes were devoted to patient care in this encounter (this includes time spent reviewing the patient's file/history, interviewing and examining the patient, counseling/reviewing plan with  patient).

## 2020-06-15 DIAGNOSIS — N390 Urinary tract infection, site not specified: Secondary | ICD-10-CM | POA: Insufficient documentation

## 2020-06-15 NOTE — Assessment & Plan Note (Signed)
Discussed options.  Clearly improved Finish Keflex.Marland Kitchen  Keep drinking plenty of water.  Update [place] as needed.  Routine cautions given to patient. If lightheaded, then skip amlodipine for that day.  Restart when feeling better and if BP is above 130/90.   She agrees to plan.  ER course and symptoms in the interval discussed.  Plan discussed.

## 2020-06-17 ENCOUNTER — Telehealth: Payer: Self-pay | Admitting: Family Medicine

## 2020-06-17 ENCOUNTER — Other Ambulatory Visit: Payer: Medicare Other

## 2020-06-17 ENCOUNTER — Other Ambulatory Visit: Payer: Self-pay

## 2020-06-17 DIAGNOSIS — R319 Hematuria, unspecified: Secondary | ICD-10-CM

## 2020-06-17 DIAGNOSIS — N390 Urinary tract infection, site not specified: Secondary | ICD-10-CM | POA: Diagnosis not present

## 2020-06-17 MED ORDER — CEPHALEXIN 500 MG PO CAPS: 500.0000 mg | ORAL_CAPSULE | Freq: Four times a day (QID) | ORAL | 0 refills | Status: DC

## 2020-06-17 NOTE — Telephone Encounter (Signed)
Patient finished antibiotic Saturday and was feeling better until yesterday. Patient states she has been feeling off all day, having burning with she goes to the bathroom and frequent urination. Patient has also been very fatigued. She wants to see if she can get another round of medication or if she needs to be seen again?

## 2020-06-17 NOTE — Telephone Encounter (Signed)
Have her come by and give a urine sample for culture.  Make sure she does that first then restart keflex.  rx sent.  If not getting better soon, then needs recheck in clinic.  Thanks.

## 2020-06-17 NOTE — Telephone Encounter (Signed)
Notified patient that rx for keflex has been sent to pharmacy to restart. Patient is coming in this afternoon for urine culture.

## 2020-06-17 NOTE — Telephone Encounter (Signed)
Pt called in wanted to let Dr. Damita Dunnings know she was feeling a little better this past weekend and Monday but today  she is still feeling bad from the UTI and wanted to know if she can get something else.

## 2020-06-18 LAB — URINE CULTURE
MICRO NUMBER:: 11677233
SPECIMEN QUALITY:: ADEQUATE

## 2020-06-25 ENCOUNTER — Ambulatory Visit (INDEPENDENT_AMBULATORY_CARE_PROVIDER_SITE_OTHER): Payer: Medicare Other | Admitting: Family Medicine

## 2020-06-25 ENCOUNTER — Encounter: Payer: Self-pay | Admitting: Family Medicine

## 2020-06-25 ENCOUNTER — Other Ambulatory Visit: Payer: Self-pay

## 2020-06-25 VITALS — BP 134/70 | HR 69 | Temp 96.9°F | Ht 64.0 in | Wt 151.1 lb

## 2020-06-25 DIAGNOSIS — N390 Urinary tract infection, site not specified: Secondary | ICD-10-CM | POA: Diagnosis not present

## 2020-06-25 DIAGNOSIS — R350 Frequency of micturition: Secondary | ICD-10-CM

## 2020-06-25 DIAGNOSIS — R300 Dysuria: Secondary | ICD-10-CM | POA: Insufficient documentation

## 2020-06-25 DIAGNOSIS — R319 Hematuria, unspecified: Secondary | ICD-10-CM

## 2020-06-25 LAB — POCT UA - MICROSCOPIC ONLY
Bacteria, U Microscopic: 0
Casts, Ur, LPF, POC: 0
Crystals, Ur, HPF, POC: 0
RBC, Urine, Miroscopic: 0 (ref 0–2)
WBC, Ur, HPF, POC: 0 (ref 0–5)
Yeast, UA: 0

## 2020-06-25 LAB — POC URINALSYSI DIPSTICK (AUTOMATED)
Bilirubin, UA: NEGATIVE
Blood, UA: NEGATIVE
Glucose, UA: NEGATIVE
Ketones, UA: NEGATIVE
Leukocytes, UA: NEGATIVE
Nitrite, UA: NEGATIVE
Protein, UA: NEGATIVE
Spec Grav, UA: 1.015 (ref 1.010–1.025)
Urobilinogen, UA: 0.2 E.U./dL
pH, UA: 6 (ref 5.0–8.0)

## 2020-06-25 NOTE — Assessment & Plan Note (Addendum)
Pt still has some bladder pressure and slt dysuria (no vaginal symptoms) after uti  Reviewed notes from ER earlier this month and also from Dr Craige Cotta is clear- sent for cx  Enc her to avoid all beverages but water  Call if symptoms worsen Azo may help otc  Disc bladder irritation s/p uti   Consider urology visit if not improved

## 2020-06-25 NOTE — Progress Notes (Signed)
Subjective:    Patient ID: Sarah Young, female    DOB: Feb 07, 1941, 80 y.o.   MRN: [id number]  This visit occurred during the SARS-CoV-2 public health emergency.  Safety protocols were in place, including screening questions prior to the visit, additional usage of staff PPE, and extensive cleaning of exam room while observing appropriate contact time as indicated for disinfecting solutions.    HPI Pt presents for malaise since uti   Wt Readings from Last 3 Encounters:  06/25/20 151 lb 1 oz (68.5 kg)  06/12/20 149 lb (67.6 kg)  06/09/20 150 lb (68 kg)   25.93 kg/m  Just not feeling great since uti  Pressure (bladder)  Urinating frequently- drinking lots of water  Has had one cup of tea last week, otherwise avoids all other beverages No fever or nausea  No visible blood in urine   She has taken a few cranberry pills- just 3   Fibromyalgia is her baseline  Sleeps ok overall (the bladder symptoms do not keep her up)    She was seen on 3/17 for uti with Dr Damita Dunnings after ER visit on 3/12  She presented to ER with malaise - and vomiting and chest tightness, at that time had pos ua and was started on keflex  CT chest noted no PE Pt noted symptoms were starting to improve on 3/17 and enc to drink water and finish keflex   Urine culture on 3/22 showed minimal bacteria and blood cx from 3/12 was normal  Lab Results  Component Value Date   CREATININE 1.27 (H) 06/09/2020   BUN 17 06/09/2020   NA 137 06/09/2020   K 3.5 06/09/2020   CL 102 06/09/2020   CO2 23 06/09/2020   known renal insufficiency At that time her GFR was down from 50 to 43  (likely due to uti)   Lab Results  Component Value Date   WBC 6.7 06/09/2020   HGB 13.6 06/09/2020   HCT 42.2 06/09/2020   MCV 91.1 06/09/2020   PLT 210 06/09/2020     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)  Today Results for orders placed or performed in visit on 06/25/20  POCT Urinalysis Dipstick  (Automated)  Result Value Ref Range   Color, UA Yellow    Clarity, UA Clear    Glucose, UA Negative Negative   Bilirubin, UA Negative    Ketones, UA Negative    Spec Grav, UA 1.015 1.010 - 1.025   Blood, UA Negative    pH, UA 6.0 5.0 - 8.0   Protein, UA Negative Negative   Urobilinogen, UA 0.2 0.2 or 1.0 E.U./dL   Nitrite, UA Negative    Leukocytes, UA Negative Negative  POCT UA - Microscopic Only  Result Value Ref Range   WBC, Ur, HPF, POC 0 0 - 5   RBC, Urine, Miroscopic 0 0 - 2   Bacteria, U Microscopic 0 None - Trace   Mucus, UA few    Epithelial cells, urine per micros few    Crystals, Ur, HPF, POC 0    Casts, Ur, LPF, POC 0    Yeast, UA 0       Review of Systems  Constitutional: Positive for fatigue. Negative for activity change, appetite change and fever.  HENT: Negative for congestion and sore throat.   Eyes: Negative for itching and visual disturbance.  Respiratory: Negative for cough and shortness of breath.   Cardiovascular: Negative for leg swelling.  Gastrointestinal: Negative for abdominal distention, abdominal pain, constipation, diarrhea and nausea.  Endocrine: Negative for cold intolerance and polydipsia.  Genitourinary: Positive for dysuria, frequency and urgency. Negative for decreased urine volume, difficulty urinating, flank pain, hematuria, vaginal discharge and vaginal pain.       Very slt dysuria Bladder pressure  Musculoskeletal: Negative for myalgias.  Skin: Negative for rash.  Allergic/Immunologic: Negative for immunocompromised state.  Neurological: Negative for dizziness and weakness.  Hematological: Negative for adenopathy.       Objective:   Physical Exam Constitutional:      General: She is not in acute distress.    Appearance: She is well-developed.  HENT:     Head: Normocephalic and atraumatic.  Eyes:     Conjunctiva/sclera: Conjunctivae normal.     Pupils: Pupils are equal, round, and reactive to light.  Neck:     Thyroid:  No thyromegaly.     Vascular: No carotid bruit or JVD.  Cardiovascular:     Rate and Rhythm: Normal rate and regular rhythm.     Heart sounds: Normal heart sounds. No gallop.   Pulmonary:     Effort: Pulmonary effort is normal. No respiratory distress.     Breath sounds: Normal breath sounds. No wheezing or rales.  Abdominal:     General: Bowel sounds are normal. There is no distension or abdominal bruit.     Palpations: Abdomen is soft. There is no mass.     Tenderness: There is abdominal tenderness. There is no right CVA tenderness, left CVA tenderness, guarding or rebound.     Comments: Some mild suprapubic tenderness  Musculoskeletal:     Cervical back: Normal range of motion and neck supple.  Lymphadenopathy:     Cervical: No cervical adenopathy.  Skin:    General: Skin is warm and dry.     Findings: No rash.  Neurological:     Mental Status: She is alert.     Deep Tendon Reflexes: Reflexes are normal and symmetric.  Psychiatric:        Mood and Affect: Mood normal.           Assessment & Plan:   Problem List Items Addressed This Visit      Genitourinary   UTI (urinary tract infection)    Urine is clear today but some bladder symptoms persist (and fatigue)  cx sent -pend result       Relevant Orders   POCT UA - Microscopic Only (Completed)     Other   Dysuria - Primary    Pt still has some bladder pressure and slt dysuria (no vaginal symptoms) after uti  Reviewed notes from ER earlier this month and also from Dr Craige Cotta is clear- sent for cx  Enc her to avoid all beverages but water  Call if symptoms worsen Azo may help otc  Disc bladder irritation s/p uti   Consider urology visit if not improved      Relevant Orders   Urine Culture   POCT UA - Microscopic Only (Completed)    Other Visit Diagnoses    Urinary frequency       Relevant Orders   POCT Urinalysis Dipstick (Automated) (Completed)

## 2020-06-25 NOTE — Patient Instructions (Signed)
Stick with water only (avoid other beverages) Avoid cranberry pills for now  Urine culture is pending -we will contact you when that returns) Urine looks clear today   AZO over the counter is ok to take to soothe bladder irritation   If you develop vaginal itching or discharge please let us know

## 2020-06-25 NOTE — Assessment & Plan Note (Signed)
Urine is clear today but some bladder symptoms persist (and fatigue)  cx sent -pend result

## 2020-06-26 LAB — URINE CULTURE
MICRO NUMBER:: 11711268
Result:: NO GROWTH
SPECIMEN QUALITY:: ADEQUATE

## 2020-07-08 ENCOUNTER — Telehealth: Payer: Self-pay | Admitting: Family Medicine

## 2020-07-08 NOTE — Telephone Encounter (Signed)
LVM for pt to rtn my call to r/s appt with nha on 07/21/20.

## 2020-07-09 ENCOUNTER — Telehealth: Payer: Self-pay | Admitting: Family Medicine

## 2020-07-09 NOTE — Telephone Encounter (Signed)
LVM for pt to rtn my call to r/s appt with nha on 07/28/20

## 2020-07-28 ENCOUNTER — Ambulatory Visit: Payer: Medicare Other

## 2020-08-07 ENCOUNTER — Other Ambulatory Visit: Payer: Self-pay

## 2020-08-07 ENCOUNTER — Other Ambulatory Visit (INDEPENDENT_AMBULATORY_CARE_PROVIDER_SITE_OTHER): Payer: Medicare Other

## 2020-08-07 ENCOUNTER — Telehealth (INDEPENDENT_AMBULATORY_CARE_PROVIDER_SITE_OTHER): Payer: Medicare Other | Admitting: *Deleted

## 2020-08-07 DIAGNOSIS — E782 Mixed hyperlipidemia: Secondary | ICD-10-CM | POA: Diagnosis not present

## 2020-08-07 DIAGNOSIS — I10 Essential (primary) hypertension: Secondary | ICD-10-CM

## 2020-08-07 DIAGNOSIS — N289 Disorder of kidney and ureter, unspecified: Secondary | ICD-10-CM

## 2020-08-07 DIAGNOSIS — E559 Vitamin D deficiency, unspecified: Secondary | ICD-10-CM

## 2020-08-07 DIAGNOSIS — E785 Hyperlipidemia, unspecified: Secondary | ICD-10-CM

## 2020-08-07 LAB — CBC WITH DIFFERENTIAL/PLATELET
Basophils Absolute: 0.1 10*3/uL (ref 0.0–0.1)
Basophils Relative: 1.2 % (ref 0.0–3.0)
Eosinophils Absolute: 0.1 10*3/uL (ref 0.0–0.7)
Eosinophils Relative: 2.6 % (ref 0.0–5.0)
HCT: 41.9 % (ref 36.0–46.0)
Hemoglobin: 14 g/dL (ref 12.0–15.0)
Lymphocytes Relative: 30.3 % (ref 12.0–46.0)
Lymphs Abs: 1.6 10*3/uL (ref 0.7–4.0)
MCHC: 33.4 g/dL (ref 30.0–36.0)
MCV: 88.2 fl (ref 78.0–100.0)
Monocytes Absolute: 0.5 10*3/uL (ref 0.1–1.0)
Monocytes Relative: 9.6 % (ref 3.0–12.0)
Neutro Abs: 2.9 10*3/uL (ref 1.4–7.7)
Neutrophils Relative %: 56.3 % (ref 43.0–77.0)
Platelets: 239 10*3/uL (ref 150.0–400.0)
RBC: 4.75 Mil/uL (ref 3.87–5.11)
RDW: 15.3 % (ref 11.5–15.5)
WBC: 5.1 10*3/uL (ref 4.0–10.5)

## 2020-08-07 LAB — COMPREHENSIVE METABOLIC PANEL
ALT: 13 U/L (ref 0–35)
AST: 19 U/L (ref 0–37)
Albumin: 3.9 g/dL (ref 3.5–5.2)
Alkaline Phosphatase: 66 U/L (ref 39–117)
BUN: 19 mg/dL (ref 6–23)
CO2: 30 mEq/L (ref 19–32)
Calcium: 9.4 mg/dL (ref 8.4–10.5)
Chloride: 105 mEq/L (ref 96–112)
Creatinine, Ser: 1.17 mg/dL (ref 0.40–1.20)
GFR: 44.15 mL/min — ABNORMAL LOW (ref >60.00)
Glucose, Bld: 90 mg/dL (ref 70–99)
Potassium: 4.4 mEq/L (ref 3.5–5.1)
Sodium: 141 mEq/L (ref 135–145)
Total Bilirubin: 0.6 mg/dL (ref 0.2–1.2)
Total Protein: 6.4 g/dL (ref 6.0–8.3)

## 2020-08-07 LAB — TSH: TSH: 3.14 u[IU]/mL (ref 0.35–4.50)

## 2020-08-07 LAB — LIPID PANEL
Cholesterol: 256 mg/dL — ABNORMAL HIGH (ref 0–200)
HDL: 91.8 mg/dL (ref >39.00)
LDL Cholesterol: 150 mg/dL — ABNORMAL HIGH (ref 0–99)
NonHDL: 164.02
Total CHOL/HDL Ratio: 3
Triglycerides: 68 mg/dL (ref 0.0–149.0)
VLDL: 13.6 mg/dL (ref 0.0–40.0)

## 2020-08-07 LAB — VITAMIN D 25 HYDROXY (VIT D DEFICIENCY, FRACTURES): VITD: 66.78 ng/mL (ref 30.00–100.00)

## 2020-08-07 NOTE — Telephone Encounter (Signed)
Pt came in for labs and no orders are in, please put orders in, blood has been drawn

## 2020-08-14 ENCOUNTER — Encounter: Payer: Self-pay | Admitting: Family Medicine

## 2020-08-14 ENCOUNTER — Other Ambulatory Visit: Payer: Self-pay

## 2020-08-14 ENCOUNTER — Ambulatory Visit (INDEPENDENT_AMBULATORY_CARE_PROVIDER_SITE_OTHER): Payer: Medicare Other | Admitting: Family Medicine

## 2020-08-14 VITALS — BP 132/66 | HR 87 | Temp 97.6°F | Ht 64.25 in | Wt 151.1 lb

## 2020-08-14 DIAGNOSIS — E782 Mixed hyperlipidemia: Secondary | ICD-10-CM

## 2020-08-14 DIAGNOSIS — N289 Disorder of kidney and ureter, unspecified: Secondary | ICD-10-CM

## 2020-08-14 DIAGNOSIS — Z0000 Encounter for general adult medical examination without abnormal findings: Secondary | ICD-10-CM | POA: Diagnosis not present

## 2020-08-14 DIAGNOSIS — M8580 Other specified disorders of bone density and structure, unspecified site: Secondary | ICD-10-CM

## 2020-08-14 DIAGNOSIS — I10 Essential (primary) hypertension: Secondary | ICD-10-CM | POA: Diagnosis not present

## 2020-08-14 DIAGNOSIS — D6859 Other primary thrombophilia: Secondary | ICD-10-CM

## 2020-08-14 DIAGNOSIS — E2839 Other primary ovarian failure: Secondary | ICD-10-CM

## 2020-08-14 DIAGNOSIS — E559 Vitamin D deficiency, unspecified: Secondary | ICD-10-CM

## 2020-08-14 MED ORDER — AMITRIPTYLINE HCL 10 MG PO TABS: ORAL_TABLET | ORAL | 3 refills | Status: DC

## 2020-08-14 MED ORDER — AMLODIPINE BESYLATE 5 MG PO TABS: 1.0000 | ORAL_TABLET | Freq: Every day | ORAL | 3 refills | Status: DC

## 2020-08-14 NOTE — Assessment & Plan Note (Signed)
Reviewed health habits including diet and exercise and skin cancer prevention Reviewed appropriate screening tests for age  Also reviewed health mt list, fam hx and immunization status , as well as social and family history   See HPI Labs reviewed  Declines further colon cancer screening  Mammogram due next mo -pt aware (personal h/o breast cancer now signed  Off from oncology) Td def for insurance Considering shingrix if covered  dexa ordered   No falls or fractures  Advance directive is up to date  No cognitive concerns Hearing screen- worse in L ear/pt denies problems  Nl vision screen   

## 2020-08-14 NOTE — Assessment & Plan Note (Signed)
Disc goals for lipids and reasons to control them Rev last labs with pt Rev low sat fat diet in detail LDL up at 150  Pt is eating poorly and plans t get back on track  Consider statin if no improvement

## 2020-08-14 NOTE — Assessment & Plan Note (Signed)
Reviewed health habits including diet and exercise and skin cancer prevention Reviewed appropriate screening tests for age  Also reviewed health mt list, fam hx and immunization status , as well as social and family history   See HPI Labs reviewed  Declines further colon cancer screening  Mammogram due next mo -pt aware (personal h/o breast cancer now signed  Off from oncology) Td def for insurance Considering shingrix if covered  dexa ordered   No falls or fractures  Advance directive is up to date  No cognitive concerns Hearing screen- worse in L ear/pt denies problems  Nl vision screen

## 2020-08-14 NOTE — Assessment & Plan Note (Signed)
Vitamin D level is therapeutic with current supplementation Disc importance of this to bone and overall health Level of 66.7

## 2020-08-14 NOTE — Assessment & Plan Note (Signed)
dexa was 10/18  Ordered one today  Past tamoxifen  No falls or fractures  D level is therapeutic  Walking daily

## 2020-08-14 NOTE — Progress Notes (Signed)
Subjective:    Patient ID: Sarah Young, female    DOB: 1941/01/04, 80 y.o.   MRN: [id number]  This visit occurred during the SARS-CoV-2 public health emergency.  Safety protocols were in place, including screening questions prior to the visit, additional usage of staff PPE, and extensive cleaning of exam room while observing appropriate contact time as indicated for disinfecting solutions.    HPI Pt presents for amw and health mt visit with review of chronic medical problems   I have personally reviewed the Medicare Annual Wellness questionnaire and have noted 1. The patient's medical and social history 2. Their use of alcohol, tobacco or illicit drugs 3. Their current medications and supplements 4. The patient's functional ability including ADL's, fall risks, home safety risks and hearing or visual             impairment. 5. Diet and physical activities 6. Evidence for depression or mood disorders  The patients weight, height, BMI have been recorded in the chart and visual acuity is per eye clinic.  I have made referrals, counseling and provided education to the patient based review of the above and I have provided the pt with a written personalized care plan for preventive services. Reviewed and updated provider list, see scanned forms.  See scanned forms.  Routine anticipatory guidance given to patient.  See health maintenance. Colon cancer screening  Colonoscopy 09  , declines further screening  Breast cancer screening  Mammogram 6/21 Personal h/o breast cancer - graduated from oncology  Very strong family h/o breast cancer in sibs  Leukemia in the family Self breast exam- no lumps or changes  Flu vaccine 10/21 Tetanus vaccine 05 Td def for ins Pneumovax completed Zoster vaccine -had one vaccine/ had shingles   Never got another one  Open to shingrix covid status-immunized Dexa 10/18   Osteopenia  Tamoxifen in the past  Falls- none Fractures-none Supplements  D  level is 66.7  Exercise - walking daily   Advance directive-up to date  Cognitive function addressed- see scanned forms- and if abnormal then additional documentation follows.   She still works and does very well  Mentally sharp  occ forgets a name - comes back to her a few minutes later No confusion Does not get lost   PMH and SH reviewed  Meds, vitals, and allergies reviewed.   ROS: See HPI.  Otherwise negative.    Weight : Wt Readings from Last 3 Encounters:  08/14/20 151 lb 2 oz (68.5 kg)  06/25/20 151 lb 1 oz (68.5 kg)  06/12/20 149 lb (67.6 kg)   25.74 kg/m  Doing ok  Still working part time  Taking care of herself   Hearing/vision:  Hearing Screening   125Hz  250Hz  500Hz  1000Hz  2000Hz  3000Hz  4000Hz  6000Hz  8000Hz   Right ear:   40 40 25  40    Left ear:   20 20 20  20       Visual Acuity Screening   Right eye Left eye Both eyes  Without correction:     With correction: 20/15 20/15 20/20    No hearing issues that pt has noted  Wears glasses   Care team  Yahye Siebert- pcp [name]- gen surg [facility]-pulmonary [facility]-cardiology  HTN bp is stable today  No cp or palpitations or headaches or edema  No side effects to medicines  BP Readings from Last 3 Encounters:  08/14/20 132/66  06/25/20 134/70  06/12/20 110/72     Amlodipine 5 mg daily  Pulse Readings from Last 3 Encounters:  08/14/20 87  06/25/20 69  06/12/20 85     Renal insuff Lab Results  Component Value Date   CREATININE 1.17 08/07/2020   BUN 19 08/07/2020   NA 141 08/07/2020   K 4.4 08/07/2020   CL 105 08/07/2020   CO2 30 08/07/2020    Hyperlipidemia Lab Results  Component Value Date   CHOL 256 (H) 08/07/2020   CHOL 200 05/02/2018   CHOL 193 11/12/2016   Lab Results  Component Value Date   HDL 91.80 08/07/2020   HDL 63.20 05/02/2018   HDL 68.80 11/12/2016   Lab Results  Component Value Date   LDLCALC 150 (H) 08/07/2020   LDLCALC 114 (H) 05/02/2018   LDLCALC 102  (H) 11/12/2016   Lab Results  Component Value Date   TRIG 68.0 08/07/2020   TRIG 115.0 05/02/2018   TRIG 108.0 11/12/2016   Lab Results  Component Value Date   CHOLHDL 3 08/07/2020   CHOLHDL 3 05/02/2018   CHOLHDL 3 11/12/2016   Lab Results  Component Value Date   LDLDIRECT 143.9 04/20/2013   LDLDIRECT 125.7 10/07/2009   LDLDIRECT 133.9 04/04/2009   LDL is up significantly at 150 Excellent HDL  Eating a lot of cheese  High cholesterol runs in the family   Other labs  Glucose 90 Lab Results  Component Value Date   ALT 13 08/07/2020   AST 19 08/07/2020   ALKPHOS 66 08/07/2020   BILITOT 0.6 08/07/2020   Lab Results  Component Value Date   TSH 3.14 08/07/2020    Lab Results  Component Value Date   WBC 5.1 08/07/2020   HGB 14.0 08/07/2020   HCT 41.9 08/07/2020   MCV 88.2 08/07/2020   PLT 239.0 08/07/2020    Patient Active Problem List   Diagnosis Date Noted  . Medicare annual wellness visit, subsequent 08/14/2020  . Aortic atherosclerosis ([place]) 08/23/2019  . Dizziness 07/11/2019  . Deep vein thrombosis (DVT) of left lower extremity ([place]) 05/25/2019  . Hypercoagulopathy ([place]) 05/22/2019  . History of pulmonary embolism 05/07/2019  . Chest pain of unknown etiology -low risk for Cardiac Etiology 04/06/2019  . SOB (shortness of breath) on exertion 04/06/2019  . Osteopenia 05/02/2018  . Routine general medical examination at a health care facility 11/21/2016  . Estrogen deficiency 11/19/2016  . Renal insufficiency 11/19/2016  . Pedal edema 12/15/2015  . Family history of breast cancer in female 05/17/2014  . Malignant neoplasm of upper-outer quadrant of right breast in female, estrogen receptor positive ([place]) 05/08/2014  . GERD (gastroesophageal reflux disease) 03/01/2014  . Lumbar degenerative disc disease 08/15/2013  . Chronic pain of scapula 08/15/2013  . Essential hypertension 10/02/2008  . Migraine 09/06/2008  . Vitamin D deficiency 06/27/2007  .  HYPERLIPIDEMIA 05/11/2007  . Generalized anxiety disorder 05/11/2007  . Disorder of bone and cartilage 05/11/2007  . H/O poliomyelitis 05/08/2007  . OSTEOARTHRITIS, HANDS, BILATERAL 05/08/2007  . Fibromyalgia 05/08/2007  . URINARY INCONTINENCE, STRESS, MILD 05/08/2007   Past Medical History:  Diagnosis Date  . Anxiety   . Arthritis    OA of hands  . Breast cancer of upper-outer quadrant of right female breast ([place]) 05/08/2014  . DVT (deep venous thrombosis) ([place]) 1980s  . Fibromyalgia   . Hyperlipidemia   . Hypertension   . Irritable bowel syndrome   . Migraine   . Osteopenia   . Personal history of radiation therapy   . Pulmonary embolus ([place]) 2021  .  S/P radiation therapy 06/2014   Right breast 4250 cGy in 17 sessions, right breast boost 1200 cGy in 6 sessions= 07/11/2014 through 08/12/2014   . Vitamin D deficiency   . Wears glasses    Past Surgical History:  Procedure Laterality Date  . ABDOMINAL HYSTERECTOMY  1972  . APPENDECTOMY    . BREAST EXCISIONAL BIOPSY Left   . BREAST LUMPECTOMY Right 2016  . BREAST SURGERY  1995   lumpectomy fibrocystic breast-lt  . CATARACT EXTRACTION     both  . CHOLECYSTECTOMY  2005  . COLONOSCOPY    . ovarian cyst removed  1985  . RADIOACTIVE SEED GUIDED PARTIAL MASTECTOMY WITH AXILLARY SENTINEL LYMPH NODE BIOPSY Right 05/30/2014   Procedure: RADIOACTIVE SEED GUIDED PARTIAL MASTECTOMY WITH AXILLARY SENTINEL LYMPH NODE BIOPSY;  Surgeon: Autumn Messing III, MD;  Location: [facility];  Service: General;  Laterality: Right;   Social History   Tobacco Use  . Smoking status: Never Smoker  . Smokeless tobacco: Never Used  . Tobacco comment: non smoker  Vaping Use  . Vaping Use: Never used  Substance Use Topics  . Alcohol use: No    Alcohol/week: 0.0 standard drinks  . Drug use: No   Family History  Problem Relation Age of Onset  . Hypertension Father   . Cancer Father 21       esophageal - smoker  .  Leukemia Mother   . Cancer Mother 88       leukemia  . Cancer Brother 88       breast CA  . Breast cancer Brother 31  . Cancer Maternal Aunt 80       leukemia  . Cancer Paternal Aunt 8       breast  . Breast cancer Paternal Aunt 48  . Breast cancer Maternal Grandmother 80  . Breast cancer Sister 52   Allergies  Allergen Reactions  . Amoxicillin Itching    REACTION: reacts opposite effect made infection worse.  Marland Kitchen Amoxicillin-Pot Clavulanate Other (See Comments)    REACTION: Felt like body turned inside out  . Esomeprazole Magnesium Nausea And Vomiting  . Gabapentin Other (See Comments)    Caused nightmares  . Omeprazole Nausea And Vomiting  . Ranitidine Hcl Other (See Comments)    REACTION: not effective   Current Outpatient Medications on File Prior to Visit  Medication Sig Dispense Refill  . acetaminophen (TYLENOL) 500 MG tablet Take 1,000 mg by mouth every 6 (six) hours as needed for headache (pain).    . Ascorbic Acid (VITAMIN C PO) Take 1 tablet by mouth every morning.    . Calcium Carbonate-Vitamin D (CALCIUM-D PO) Take 1 tablet by mouth 2 (two) times daily.    . Cholecalciferol (VITAMIN D PO) Take 1 tablet by mouth every morning.    . Cyanocobalamin (VITAMIN B-12 PO) Take 1 tablet by mouth every morning.    Marland Kitchen ketoconazole (NIZORAL) 2 % cream Apply 1 application topically daily. 15 g 0  . Multiple Vitamins-Minerals (ZINC PO) Take 1 tablet by mouth every morning.    Vladimir Faster Glycol-Propyl Glycol (SYSTANE) 0.4-0.3 % SOLN Place 1 drop into both eyes 2 (two) times daily.    . rivaroxaban (XARELTO) 20 MG TABS tablet Take 1 tablet (20 mg total) by mouth daily with supper. 90 tablet 3  . SUMAtriptan (IMITREX) 100 MG tablet TAKE 1 TABLET BY MOUTH AS NEEDED FOR MIGRAINE, MAY REPEAT IN 2 HOURS IF NEEDED. MAX 2 TABS IN 24 HOURS. 9  tablet 3  . triamcinolone cream (KENALOG) 0.1 % Apply 1 application topically 2 (two) times daily. To affected area (insect sting) 15 g 0   No  current facility-administered medications on file prior to visit.    Review of Systems  Constitutional: Negative for activity change, appetite change, fatigue, fever and unexpected weight change.  HENT: Negative for congestion, ear pain, rhinorrhea, sinus pressure and sore throat.   Eyes: Negative for pain, redness and visual disturbance.  Respiratory: Negative for cough, shortness of breath and wheezing.   Cardiovascular: Negative for chest pain and palpitations.  Gastrointestinal: Negative for abdominal pain, blood in stool, constipation and diarrhea.  Endocrine: Negative for polydipsia and polyuria.  Genitourinary: Negative for dysuria, frequency and urgency.  Musculoskeletal: Positive for arthralgias. Negative for back pain and myalgias.  Skin: Negative for pallor and rash.  Allergic/Immunologic: Negative for environmental allergies.  Neurological: Negative for dizziness, syncope and headaches.  Hematological: Negative for adenopathy. Does not bruise/bleed easily.  Psychiatric/Behavioral: Negative for decreased concentration and dysphoric mood. The patient is not nervous/anxious.        Objective:   Physical Exam Constitutional:      General: She is not in acute distress.    Appearance: Normal appearance. She is well-developed and normal weight. She is not ill-appearing or diaphoretic.  HENT:     Head: Normocephalic and atraumatic.     Right Ear: Tympanic membrane, ear canal and external ear normal.     Left Ear: Tympanic membrane, ear canal and external ear normal.     Nose: Nose normal. No congestion.     Mouth/Throat:     Mouth: Mucous membranes are moist.     Pharynx: Oropharynx is clear. No posterior oropharyngeal erythema.  Eyes:     General: No scleral icterus.    Extraocular Movements: Extraocular movements intact.     Conjunctiva/sclera: Conjunctivae normal.     Pupils: Pupils are equal, round, and reactive to light.  Neck:     Thyroid: No thyromegaly.      Vascular: No carotid bruit or JVD.  Cardiovascular:     Rate and Rhythm: Normal rate and regular rhythm.     Pulses: Normal pulses.     Heart sounds: Normal heart sounds. No gallop.   Pulmonary:     Effort: Pulmonary effort is normal. No respiratory distress.     Breath sounds: Normal breath sounds. No wheezing.     Comments: Good air exch Chest:     Chest wall: No tenderness.  Abdominal:     General: Bowel sounds are normal. There is no distension or abdominal bruit.     Palpations: Abdomen is soft. There is no mass.     Tenderness: There is no abdominal tenderness.     Hernia: No hernia is present.  Genitourinary:    Comments: Breast exam: No mass, nodules, thickening, tenderness, bulging, retraction, inflamation, nipple discharge or skin changes noted.  No axillary or clavicular LA.     Baseline scarring on R breast from prior surgery  Musculoskeletal:        General: No tenderness. Normal range of motion.     Cervical back: Normal range of motion and neck supple. No rigidity. No muscular tenderness.     Right lower leg: No edema.     Left lower leg: No edema.     Comments: No kyphosis   Lymphadenopathy:     Cervical: No cervical adenopathy.  Skin:    General: Skin is warm and dry.  Coloration: Skin is not pale.     Findings: No erythema or rash.  Neurological:     Mental Status: She is alert. Mental status is at baseline.     Cranial Nerves: No cranial nerve deficit.     Motor: No abnormal muscle tone.     Coordination: Coordination normal.     Gait: Gait normal.     Deep Tendon Reflexes: Reflexes are normal and symmetric. Reflexes normal.  Psychiatric:        Mood and Affect: Mood normal.        Cognition and Memory: Cognition and memory normal.           Assessment & Plan:   Problem List Items Addressed This Visit      Cardiovascular and Mediastinum   Essential hypertension (Chronic)    bp in fair control at this time  BP Readings from Last 1  Encounters:  08/14/20 132/66   No changes needed Most recent labs reviewed  Disc lifstyle change with low sodium diet and exercise  Plan to continue amlodipine 5 mg daily       Relevant Medications   amLODipine (NORVASC) 5 MG tablet     Musculoskeletal and Integument   Osteopenia    dexa was 10/18  Ordered one today  Past tamoxifen  No falls or fractures  D level is therapeutic  Walking daily        Genitourinary   Renal insufficiency    Stable cr at 1.17 Good fluid intake         Other   HYPERLIPIDEMIA (Chronic)    Disc goals for lipids and reasons to control them Rev last labs with pt Rev low sat fat diet in detail LDL up at 150  Pt is eating poorly and plans t get back on track  Consider statin if no improvement        Relevant Medications   amLODipine (NORVASC) 5 MG tablet   Vitamin D deficiency    Vitamin D level is therapeutic with current supplementation Disc importance of this to bone and overall health Level of 66.7      Estrogen deficiency   Relevant Orders   DG Bone Density   Routine general medical examination at a health care facility    Reviewed health habits including diet and exercise and skin cancer prevention Reviewed appropriate screening tests for age  Also reviewed health mt list, fam hx and immunization status , as well as social and family history   See HPI Labs reviewed  Declines further colon cancer screening  Mammogram due next mo -pt aware (personal h/o breast cancer now signed  Off from oncology) Td def for insurance Considering shingrix if covered  dexa ordered   No falls or fractures  Advance directive is up to date  No cognitive concerns Hearing screen- worse in L ear/pt denies problems  Nl vision screen        Hypercoagulopathy ([place])    Continues to take xarelto 20 mg daily      Medicare annual wellness visit, subsequent - Primary    Reviewed health habits including diet and exercise and skin cancer  prevention Reviewed appropriate screening tests for age  Also reviewed health mt list, fam hx and immunization status , as well as social and family history   See HPI Labs reviewed  Declines further colon cancer screening  Mammogram due next mo -pt aware (personal h/o breast cancer now signed  Off from oncology) Td def  for insurance Considering shingrix if covered  dexa ordered   No falls or fractures  Advance directive is up to date  No cognitive concerns Hearing screen- worse in L ear/pt denies problems  Nl vision screen

## 2020-08-14 NOTE — Assessment & Plan Note (Signed)
Stable cr at 1.17 Good fluid intake

## 2020-08-14 NOTE — Assessment & Plan Note (Signed)
Continues to take xarelto 20 mg daily

## 2020-08-14 NOTE — Assessment & Plan Note (Signed)
bp in fair control at this time  BP Readings from Last 1 Encounters:  08/14/20 132/66   No changes needed Most recent labs reviewed  Disc lifstyle change with low sodium diet and exercise  Plan to continue amlodipine 5 mg daily

## 2020-08-14 NOTE — Patient Instructions (Addendum)
If you are interested in the new shingles vaccine (Shingrix) - call your local pharmacy to check on coverage and availability  If affordable, get on a wait list at your pharmacy to get the vaccine.  I placed an order for bone density test -you can call to schedule that at the breast center   We need to watch your cholesterol  Avoid red meat/ fried foods/ egg yolks/ fatty breakfast meats/ butter, cheese and high fat dairy/ and shellfish

## 2020-08-19 DIAGNOSIS — D485 Neoplasm of uncertain behavior of skin: Secondary | ICD-10-CM | POA: Diagnosis not present

## 2020-08-19 DIAGNOSIS — L570 Actinic keratosis: Secondary | ICD-10-CM | POA: Diagnosis not present

## 2020-08-21 ENCOUNTER — Other Ambulatory Visit: Payer: Self-pay | Admitting: Oncology

## 2020-08-21 DIAGNOSIS — Z1231 Encounter for screening mammogram for malignant neoplasm of breast: Secondary | ICD-10-CM

## 2020-08-26 ENCOUNTER — Other Ambulatory Visit: Payer: Self-pay | Admitting: Family Medicine

## 2020-08-27 NOTE — Telephone Encounter (Signed)
CPE was on 08/14/20, last filled on 01/17/20 #9 tabs with 3 refills, please advise

## 2020-08-29 ENCOUNTER — Other Ambulatory Visit: Payer: Self-pay

## 2020-08-29 ENCOUNTER — Ambulatory Visit
Admission: RE | Admit: 2020-08-29 | Discharge: 2020-08-29 | Disposition: A | Discharge status: Home / Self Care | Payer: Medicare Other | Source: Ambulatory Visit | Attending: Family Medicine | Admitting: Family Medicine

## 2020-08-29 DIAGNOSIS — D3131 Benign neoplasm of right choroid: Secondary | ICD-10-CM | POA: Diagnosis not present

## 2020-08-29 DIAGNOSIS — H35013 Changes in retinal vascular appearance, bilateral: Secondary | ICD-10-CM | POA: Diagnosis not present

## 2020-08-29 DIAGNOSIS — M810 Age-related osteoporosis without current pathological fracture: Secondary | ICD-10-CM | POA: Diagnosis not present

## 2020-08-29 DIAGNOSIS — Z780 Asymptomatic menopausal state: Secondary | ICD-10-CM | POA: Diagnosis not present

## 2020-08-29 DIAGNOSIS — H35363 Drusen (degenerative) of macula, bilateral: Secondary | ICD-10-CM | POA: Diagnosis not present

## 2020-08-29 DIAGNOSIS — M85852 Other specified disorders of bone density and structure, left thigh: Secondary | ICD-10-CM | POA: Diagnosis not present

## 2020-08-29 DIAGNOSIS — H35373 Puckering of macula, bilateral: Secondary | ICD-10-CM | POA: Diagnosis not present

## 2020-08-29 DIAGNOSIS — E2839 Other primary ovarian failure: Secondary | ICD-10-CM

## 2020-09-10 ENCOUNTER — Ambulatory Visit: Payer: Medicare Other | Admitting: Internal Medicine

## 2020-09-25 ENCOUNTER — Other Ambulatory Visit: Payer: Self-pay | Admitting: Family Medicine

## 2020-10-10 ENCOUNTER — Ambulatory Visit
Admission: EM | Admit: 2020-10-10 | Discharge: 2020-10-10 | Disposition: A | Discharge status: Home / Self Care | Payer: Medicare Other | Attending: Emergency Medicine | Admitting: Emergency Medicine

## 2020-10-10 ENCOUNTER — Other Ambulatory Visit: Payer: Self-pay

## 2020-10-10 ENCOUNTER — Encounter: Payer: Self-pay | Admitting: Emergency Medicine

## 2020-10-10 ENCOUNTER — Telehealth: Payer: Self-pay

## 2020-10-10 DIAGNOSIS — N3001 Acute cystitis with hematuria: Secondary | ICD-10-CM | POA: Insufficient documentation

## 2020-10-10 LAB — POCT URINALYSIS DIP (MANUAL ENTRY)
Bilirubin, UA: NEGATIVE
Glucose, UA: NEGATIVE mg/dL
Ketones, POC UA: NEGATIVE mg/dL
Nitrite, UA: NEGATIVE
Protein Ur, POC: NEGATIVE mg/dL
Spec Grav, UA: 1.015 (ref 1.010–1.025)
Urobilinogen, UA: 0.2 E.U./dL
pH, UA: 6 (ref 5.0–8.0)

## 2020-10-10 MED ORDER — CEPHALEXIN 500 MG PO CAPS: 500.0000 mg | ORAL_CAPSULE | Freq: Three times a day (TID) | ORAL | 0 refills | Status: AC

## 2020-10-10 NOTE — Telephone Encounter (Signed)
[facility] - Client TELEPHONE ADVICE RECORD [facility] Patient Name: Sarah Young Gender: Female DOB: 07/28/1935 Age: 80 Y 2 M 14 D Return Phone Number: [phone] (Primary), [phone] (Secondary) Address[place] [address] Client [facility] - Client Client Site [facility] - Day Physician Ria Bush - MD Contact Type Call Who Is Calling Patient / Member / Family / Caregiver Call Type Triage / Clinical Relationship To Patient Self Return Phone Number [phone] (Primary) Chief Complaint Poison [place], [place] Reason for Call Symptomatic / Request for [place] states he is experiencing poison ivy on ankle/feet with itching. Translation No Nurse Assessment Nurse: Alveta Heimlich, RN, Rise Paganini Date/Time (Eastern Time): 10/10/2020 10:01:26 AM Confirm and document reason for call. If symptomatic, describe symptoms. ---Caller states he has poison ivy, poison oak on his feet and ankles. It started 3 days ago. He went to [facility] and they gave him a cream that is not helping. The rash is spreading to knees. Does the patient have any new or worsening symptoms? ---Yes Will a triage be completed? ---Yes Related visit to physician within the last 2 weeks? ---Yes Does the PT have any chronic conditions? (i.e. diabetes, asthma, this includes High risk factors for pregnancy, etc.) ---Yes List chronic conditions. ---HTN, open heart surgery Is this a behavioral health or substance abuse call? ---No Guidelines Guideline Title Affirmed Question Affirmed Notes Nurse Date/Time (Eastern Time) [place] to SEVERE itching (e.g., interferes with work, school, sleep, or other activities) Alveta Heimlich, Therapist, sports, [facility] 10/10/2020 10:04:36 AM Disp. Time Eilene Ghazi Time) Disposition Final User 10/10/2020 10:06:59 AM See PCP within 24  Hours Yes Alveta Heimlich, RN, Rise Paganini PLEASE NOTE: All timestamps contained within this report are represented as [place] Standard Time. CONFIDENTIALTY NOTICE: This fax transmission is intended only for the addressee. It contains information that is legally privileged, confidential or otherwise protected from use or disclosure. If you are not the intended recipient, you are strictly prohibited from reviewing, disclosing, copying using or disseminating any of this information or taking any action in reliance on or regarding this information. If you have received this fax in error, please notify us immediately by telephone so that we can arrange for its return to [place]. Phone: [phone], Toll-Free: [phone], Fax: [phone] Page: 2 of 2 Call Id: [id number] [place] Disagree/Comply Comply Caller Understands Yes PreDisposition Call Doctor Care Advice Given Per Guideline SEE PCP WITHIN 24 HOURS: CALL BACK IF: * You become worse CARE ADVICE given per [place] (Adult) guideline. Referrals REFERRED TO PCP OFFICE

## 2020-10-10 NOTE — Telephone Encounter (Signed)
I spoke with pt and [facility] had a cancellation but pt has already gone to [facility] in [place] and pt has already got med for UTI. Pt appreciated the call for the cancellation appt and pt has already scheduled FU 10/17/20 at 10:40 with Gentry Fitz NP. Sending note to Dr Glori Bickers as PCP, and Gentry Fitz NP as Juluis Rainier.

## 2020-10-10 NOTE — ED Provider Notes (Signed)
[facility]    CSN: [id number] Arrival date & time: 10/10/20  0930      History   Chief Complaint Chief Complaint  Patient presents with   Urinary Frequency   Abdominal Pain     HPI Sarah Young is a 80 y.o. female.  Patient presents with 4-day history of dysuria, frequency, suprapubic pressure.  She denies fever, chills, hematuria, vomiting, diarrhea, or other symptoms.  Treatment attempted at home with Azo.  Her medical history includes hypertension, pulmonary embolism, DVT, breast cancer, poliomyelitis, migraine headaches, fibromyalgia.  Patient was seen at [facility] ED in March 2022 for UTI.     The history is provided by the patient and medical records.   Past Medical History:  Diagnosis Date   Anxiety    Arthritis    OA of hands   Breast cancer of upper-outer quadrant of right female breast ([place]) 05/08/2014   DVT (deep venous thrombosis) (HCC) 1980s   Fibromyalgia    Hyperlipidemia    Hypertension    Irritable bowel syndrome    Migraine    Osteopenia    Personal history of radiation therapy    Pulmonary embolus ([place]) 2021   S/P radiation therapy 06/2014   Right breast 4250 cGy in 17 sessions, right breast boost 1200 cGy in 6 sessions  = 07/11/2014 through 08/12/2014                         Vitamin D deficiency    Wears glasses     Patient Active Problem List   Diagnosis Date Noted   Medicare annual wellness visit, subsequent 08/14/2020   Aortic atherosclerosis ([place]) 08/23/2019   Dizziness 07/11/2019   Deep vein thrombosis (DVT) of left lower extremity ([place]) 05/25/2019   Hypercoagulopathy ([place]) 05/22/2019   History of pulmonary embolism 05/07/2019   Chest pain of unknown etiology -low risk for Cardiac Etiology 04/06/2019   SOB (shortness of breath) on exertion 04/06/2019   Osteopenia 05/02/2018   Routine general medical examination at a health care facility 11/21/2016   Estrogen deficiency 11/19/2016   Renal insufficiency 11/19/2016   Pedal  edema 12/15/2015   Family history of breast cancer in female 05/17/2014   Malignant neoplasm of upper-outer quadrant of right breast in female, estrogen receptor positive ([place]) 05/08/2014   GERD (gastroesophageal reflux disease) 03/01/2014   Lumbar degenerative disc disease 08/15/2013   Chronic pain of scapula 08/15/2013   Essential hypertension 10/02/2008   Migraine 09/06/2008   Vitamin D deficiency 06/27/2007   HYPERLIPIDEMIA 05/11/2007   Generalized anxiety disorder 05/11/2007   Disorder of bone and cartilage 05/11/2007   H/O poliomyelitis 05/08/2007   OSTEOARTHRITIS, HANDS, BILATERAL 05/08/2007   Fibromyalgia 05/08/2007   URINARY INCONTINENCE, STRESS, MILD 05/08/2007    Past Surgical History:  Procedure Laterality Date   ABDOMINAL HYSTERECTOMY  1972   APPENDECTOMY     BREAST EXCISIONAL BIOPSY Left    BREAST LUMPECTOMY Right 2016   BREAST SURGERY  1995   lumpectomy fibrocystic breast-lt   CATARACT EXTRACTION     both   CHOLECYSTECTOMY  2005   COLONOSCOPY     ovarian cyst removed  [place] SENTINEL LYMPH NODE BIOPSY Right 05/30/2014   Procedure: RADIOACTIVE SEED GUIDED PARTIAL MASTECTOMY WITH AXILLARY SENTINEL LYMPH NODE BIOPSY;  Surgeon: Autumn Messing III, MD;  Location: [facility];  Service: General;  Laterality: Right;    OB History  No obstetric history on file.      Home Medications    Prior to Admission medications   Medication Sig Start Date End Date Taking? Authorizing Provider  amitriptyline (ELAVIL) 10 MG tablet TAKE 2 TABLETS (20 MG TOTAL) BY MOUTH AT BEDTIME. 08/14/20  Yes Tower, Wynelle Fanny, MD  amLODipine (NORVASC) 5 MG tablet Take 1 tablet (5 mg total) by mouth daily. 08/14/20  Yes Tower, Wynelle Fanny, MD  cephALEXin (KEFLEX) 500 MG capsule Take 1 capsule (500 mg total) by mouth 3 (three) times daily for 7 days. 10/10/20 10/17/20 Yes Sharion Balloon, NP  acetaminophen (TYLENOL) 500 MG tablet Take 1,000 mg  by mouth every 6 (six) hours as needed for headache (pain).    [provider]  Ascorbic Acid (VITAMIN C PO) Take 1 tablet by mouth every morning.    [provider]  Calcium Carbonate-Vitamin D (CALCIUM-D PO) Take 1 tablet by mouth 2 (two) times daily.    [provider]  Cholecalciferol (VITAMIN D PO) Take 1 tablet by mouth every morning.    [provider]  Cyanocobalamin (VITAMIN B-12 PO) Take 1 tablet by mouth every morning.    [provider]  ketoconazole (NIZORAL) 2 % cream Apply 1 application topically daily. 11/27/19   Magrinat, Virgie Dad, MD  Multiple Vitamins-Minerals (ZINC PO) Take 1 tablet by mouth every morning.    [provider]  Polyethyl Glycol-Propyl Glycol (SYSTANE) 0.4-0.3 % SOLN Place 1 drop into both eyes 2 (two) times daily.    [provider]  rivaroxaban (XARELTO) 20 MG TABS tablet Take 1 tablet (20 mg total) by mouth daily with supper. 03/26/20   Tower, Wynelle Fanny, MD  SUMAtriptan (IMITREX) 100 MG tablet TAKE 1 TABLET BY MOUTH AS NEEDED FOR MIGRAINE, MAY REPEAT IN 2 HOURS IF NEEDED. MAX 2 TABS IN 24 HOURS. 08/27/20   Tower, Wynelle Fanny, MD  triamcinolone cream (KENALOG) 0.1 % Apply 1 application topically 2 (two) times daily. To affected area (insect sting) 11/19/19   Tower, Wynelle Fanny, MD    Family History Family History  Problem Relation Age of Onset   Hypertension Father    Cancer Father 25       esophageal - smoker   Leukemia Mother    Cancer Mother 68       leukemia   Cancer Brother 7       breast CA   Breast cancer Brother 32   Cancer Maternal Aunt 80       leukemia   Cancer Paternal Aunt 78       breast   Breast cancer Paternal Aunt 9   Breast cancer Maternal Grandmother 73   Breast cancer Sister 8    Social History Social History   Tobacco Use   Smoking status: Never   Smokeless tobacco: Never   Tobacco comments:    non smoker  Vaping Use   Vaping Use: Never used  Substance Use Topics    Alcohol use: No    Alcohol/week: 0.0 standard drinks   Drug use: No     Allergies   Amoxicillin, Amoxicillin-pot clavulanate, Esomeprazole magnesium, Gabapentin, Omeprazole, and Ranitidine hcl   Review of Systems Review of Systems  Constitutional:  Negative for chills and fever.  Respiratory:  Negative for cough and shortness of breath.   Cardiovascular:  Negative for chest pain and palpitations.  Gastrointestinal:  Positive for abdominal pain. Negative for vomiting.  Genitourinary:  Positive for dysuria and frequency. Negative for  flank pain, hematuria, pelvic pain and vaginal discharge.  Skin:  Negative for color change and rash.  All other systems reviewed and are negative.   Physical Exam Triage Vital Signs ED Triage Vitals  Enc Vitals Group     BP      Pulse      Resp      Temp      Temp src      SpO2      Weight      Height      Head Circumference      Peak Flow      Pain Score      Pain Loc      Pain Edu?      Excl. in [place]?    No data found.  Updated Vital Signs BP 135/79 (BP Location: Left Arm)   Pulse 85   Temp 98.1 F (36.7 C) (Oral)   Resp 14   SpO2 98%   Visual Acuity Right Eye Distance:   Left Eye Distance:   Bilateral Distance:    Right Eye Near:   Left Eye Near:    Bilateral Near:     Physical Exam Vitals and nursing note reviewed.  Constitutional:      General: She is not in acute distress.    Appearance: She is well-developed. She is not ill-appearing.  HENT:     Head: Normocephalic and atraumatic.     Mouth/Throat:     Mouth: Mucous membranes are moist.  Eyes:     Conjunctiva/sclera: Conjunctivae normal.  Cardiovascular:     Rate and Rhythm: Normal rate and regular rhythm.     Heart sounds: Normal heart sounds.  Pulmonary:     Effort: Pulmonary effort is normal. No respiratory distress.     Breath sounds: Normal breath sounds.  Abdominal:     Palpations: Abdomen is soft.     Tenderness: There is no abdominal tenderness.  There is no right CVA tenderness, left CVA tenderness, guarding or rebound.  Musculoskeletal:     Cervical back: Neck supple.  Skin:    General: Skin is warm and dry.  Neurological:     General: No focal deficit present.     Mental Status: She is alert and oriented to person, place, and time.     Gait: Gait normal.  Psychiatric:        Mood and Affect: Mood normal.        Behavior: Behavior normal.     UC Treatments / Results  Labs (all labs ordered are listed, but only abnormal results are displayed) Labs Reviewed  POCT URINALYSIS DIP (MANUAL ENTRY) - Abnormal; Notable for the following components:      Result Value   Blood, UA trace-intact (*)    Leukocytes, UA Small (1+) (*)    All other components within normal limits  URINE CULTURE    EKG   Radiology No results found.  Procedures Procedures (including critical care time)  Medications Ordered in UC Medications - No data to display  Initial Impression / Assessment and Plan / UC Course  I have reviewed the triage vital signs and the nursing notes.  Pertinent labs & imaging results that were available during my care of the patient were reviewed by me and considered in my medical decision making (see chart for details).  Acute cystitis with hematuria.  Treating with Keflex.  Urine culture pending.  Instructed patient to schedule a follow-up with her PCP for recheck in  1 week.  She agrees to plan of care.   Final Clinical Impressions(s) / UC Diagnoses   Final diagnoses:  Acute cystitis with hematuria     Discharge Instructions      Take the antibiotic as directed.  The urine culture is pending.  We will call you if it shows the need to change or discontinue your antibiotic.    Follow up with your primary care provider in 1 week.         ED Prescriptions     Medication Sig Dispense Auth. Provider   cephALEXin (KEFLEX) 500 MG capsule Take 1 capsule (500 mg total) by mouth 3 (three) times daily for 7  days. 21 capsule Sharion Balloon, NP      I have reviewed the PDMP during this encounter.   Sharion Balloon, NP 10/10/20 1005

## 2020-10-10 NOTE — ED Triage Notes (Signed)
Patient c/o ABD pressure and urinary frequency x 4 days.   Patient denies fever.   Patient endorses " I'm having pressure in my lower ABD".   Patient endorses a "strong odor". Patient endorses "burning"w/ urination.   Patient denies hematuria, N/V, and and abnormal vaginal discharge.   Patient endorses a past hospitalization this year due to UTI.   Patient has taken AZO with no change to symptoms.

## 2020-10-10 NOTE — Discharge Instructions (Addendum)
Take the antibiotic as directed.  The urine culture is pending.  We will call you if it shows the need to change or discontinue your antibiotic.    Follow up with your primary care provider in 1 week.

## 2020-10-11 LAB — URINE CULTURE: Culture: NO GROWTH

## 2020-10-13 NOTE — Telephone Encounter (Signed)
Noted glad pt got appropriate care

## 2020-10-15 ENCOUNTER — Ambulatory Visit
Admission: RE | Admit: 2020-10-15 | Discharge: 2020-10-15 | Disposition: A | Discharge status: Home / Self Care | Payer: Medicare Other | Source: Ambulatory Visit | Attending: Oncology | Admitting: Oncology

## 2020-10-15 ENCOUNTER — Other Ambulatory Visit: Payer: Self-pay

## 2020-10-15 DIAGNOSIS — Z1231 Encounter for screening mammogram for malignant neoplasm of breast: Secondary | ICD-10-CM | POA: Diagnosis not present

## 2020-10-17 ENCOUNTER — Ambulatory Visit: Payer: Medicare Other | Admitting: Primary Care

## 2020-11-24 DIAGNOSIS — D485 Neoplasm of uncertain behavior of skin: Secondary | ICD-10-CM | POA: Diagnosis not present

## 2020-11-24 DIAGNOSIS — L570 Actinic keratosis: Secondary | ICD-10-CM | POA: Diagnosis not present

## 2020-11-24 DIAGNOSIS — D1801 Hemangioma of skin and subcutaneous tissue: Secondary | ICD-10-CM | POA: Diagnosis not present

## 2020-11-24 DIAGNOSIS — D229 Melanocytic nevi, unspecified: Secondary | ICD-10-CM | POA: Diagnosis not present

## 2020-11-24 DIAGNOSIS — L814 Other melanin hyperpigmentation: Secondary | ICD-10-CM | POA: Diagnosis not present

## 2020-11-24 DIAGNOSIS — L821 Other seborrheic keratosis: Secondary | ICD-10-CM | POA: Diagnosis not present

## 2020-12-25 ENCOUNTER — Other Ambulatory Visit: Payer: Self-pay | Admitting: Family Medicine

## 2020-12-25 NOTE — Telephone Encounter (Signed)
CPE was 08/14/20 last filled on 08/27/20 #9 tabs with 3 refill

## 2021-01-19 ENCOUNTER — Ambulatory Visit: Admission: EM | Admit: 2021-01-19 | Discharge: 2021-01-19 | Payer: Medicare Other

## 2021-01-19 ENCOUNTER — Encounter: Payer: Self-pay | Admitting: Emergency Medicine

## 2021-01-19 DIAGNOSIS — R1011 Right upper quadrant pain: Secondary | ICD-10-CM | POA: Diagnosis not present

## 2021-01-19 NOTE — Discharge Instructions (Addendum)
Please go to emergency department at [facility] for further care and management.

## 2021-01-19 NOTE — ED Triage Notes (Signed)
Pt was using her massager 5 days ago to relieve some shoulder pain. After using the massager she noticed some right side rib pain.

## 2021-01-19 NOTE — ED Provider Notes (Signed)
[facility]  ____________________________________________  Time seen: Approximately 1:40 PM  I have reviewed the triage vital signs and the nursing notes.   HISTORY  Chief Complaint Rib Pain   Historian Patient     HPI Sarah Young is a 80 y.o. female with a history of malignancy, hypertension, DVT and PE complains of mild shortness of breath and right upper quadrant abdominal pain for the past 2 days.  Patient states that she was using her massager along her upper back and noticed right upper quadrant abdominal pain afterwards.  Patient states that she would like her pancreas "checked out".  She has a friend that was recently diagnosed with pancreatic cancer.  She denies current chest pain or nausea.  No other alleviating measures have been attempted.   Past Medical History:  Diagnosis Date   Anxiety    Arthritis    OA of hands   Breast cancer of upper-outer quadrant of right female breast ([place]) 05/08/2014   DVT (deep venous thrombosis) (HCC) 1980s   Fibromyalgia    Hyperlipidemia    Hypertension    Irritable bowel syndrome    Migraine    Osteopenia    Personal history of radiation therapy    Pulmonary embolus ([place]) 2021   S/P radiation therapy 06/2014   Right breast 4250 cGy in 17 sessions, right breast boost 1200 cGy in 6 sessions  = 07/11/2014 through 08/12/2014                         Vitamin D deficiency    Wears glasses      Immunizations up to date:  Yes.     Past Medical History:  Diagnosis Date   Anxiety    Arthritis    OA of hands   Breast cancer of upper-outer quadrant of right female breast ([place]) 05/08/2014   DVT (deep venous thrombosis) (HCC) 1980s   Fibromyalgia    Hyperlipidemia    Hypertension    Irritable bowel syndrome    Migraine    Osteopenia    Personal history of radiation therapy    Pulmonary embolus ([place]) 2021   S/P radiation therapy 06/2014   Right breast 4250 cGy in 17 sessions, right breast boost 1200 cGy  in 6 sessions  = 07/11/2014 through 08/12/2014                         Vitamin D deficiency    Wears glasses     Patient Active Problem List   Diagnosis Date Noted   Medicare annual wellness visit, subsequent 08/14/2020   Aortic atherosclerosis ([place]) 08/23/2019   Dizziness 07/11/2019   Deep vein thrombosis (DVT) of left lower extremity ([place]) 05/25/2019   Hypercoagulopathy ([place]) 05/22/2019   History of pulmonary embolism 05/07/2019   Chest pain of unknown etiology -low risk for Cardiac Etiology 04/06/2019   SOB (shortness of breath) on exertion 04/06/2019   Osteopenia 05/02/2018   Routine general medical examination at a health care facility 11/21/2016   Estrogen deficiency 11/19/2016   Renal insufficiency 11/19/2016   Pedal edema 12/15/2015   Family history of breast cancer in female 05/17/2014   Malignant neoplasm of upper-outer quadrant of right breast in female, estrogen receptor positive ([place]) 05/08/2014   GERD (gastroesophageal reflux disease) 03/01/2014   Lumbar degenerative disc disease 08/15/2013   Chronic pain of scapula 08/15/2013   Essential hypertension 10/02/2008   Migraine 09/06/2008   Vitamin  D deficiency 06/27/2007   HYPERLIPIDEMIA 05/11/2007   Generalized anxiety disorder 05/11/2007   Disorder of bone and cartilage 05/11/2007   H/O poliomyelitis 05/08/2007   OSTEOARTHRITIS, HANDS, BILATERAL 05/08/2007   Fibromyalgia 05/08/2007   URINARY INCONTINENCE, STRESS, MILD 05/08/2007    Past Surgical History:  Procedure Laterality Date   ABDOMINAL HYSTERECTOMY  1972   APPENDECTOMY     BREAST EXCISIONAL BIOPSY Left    BREAST LUMPECTOMY Right 2016   BREAST SURGERY  1995   lumpectomy fibrocystic breast-lt   CATARACT EXTRACTION     both   CHOLECYSTECTOMY  2005   COLONOSCOPY     ovarian cyst removed  [place] SENTINEL LYMPH NODE BIOPSY Right 05/30/2014   Procedure: RADIOACTIVE SEED GUIDED PARTIAL MASTECTOMY WITH  AXILLARY SENTINEL LYMPH NODE BIOPSY;  Surgeon: Autumn Messing III, MD;  Location: [facility];  Service: General;  Laterality: Right;    Prior to Admission medications   Medication Sig Start Date End Date Taking? Authorizing Provider  acetaminophen (TYLENOL) 500 MG tablet Take 1,000 mg by mouth every 6 (six) hours as needed for headache (pain).   Yes [provider]  amitriptyline (ELAVIL) 10 MG tablet TAKE 2 TABLETS (20 MG TOTAL) BY MOUTH AT BEDTIME. 08/14/20  Yes Tower, Wynelle Fanny, MD  amLODipine (NORVASC) 5 MG tablet Take 1 tablet (5 mg total) by mouth daily. 08/14/20  Yes Tower, Wynelle Fanny, MD  Ascorbic Acid (VITAMIN C PO) Take 1 tablet by mouth every morning.   Yes [provider]  Calcium Carbonate-Vitamin D (CALCIUM-D PO) Take 1 tablet by mouth 2 (two) times daily.   Yes [provider]  Cholecalciferol (VITAMIN D PO) Take 1 tablet by mouth every morning.   Yes [provider]  Cyanocobalamin (VITAMIN B-12 PO) Take 1 tablet by mouth every morning.   Yes [provider]  ketoconazole (NIZORAL) 2 % cream Apply 1 application topically daily. 11/27/19  Yes Magrinat, Virgie Dad, MD  Multiple Vitamins-Minerals (ZINC PO) Take 1 tablet by mouth every morning.   Yes [provider]  Polyethyl Glycol-Propyl Glycol (SYSTANE) 0.4-0.3 % SOLN Place 1 drop into both eyes 2 (two) times daily.   Yes [provider]  rivaroxaban (XARELTO) 20 MG TABS tablet Take 1 tablet (20 mg total) by mouth daily with supper. 03/26/20  Yes Tower, Wynelle Fanny, MD  SUMAtriptan (IMITREX) 100 MG tablet TAKE 1 TABLET BY MOUTH AS NEEDED FOR MIGRAINE, MAY REPEAT IN 2 HOURS IF NEEDED. MAX 2 TABS IN 24 HOURS. 12/28/20  Yes Tower, Wynelle Fanny, MD  triamcinolone cream (KENALOG) 0.1 % Apply 1 application topically 2 (two) times daily. To affected area (insect sting) 11/19/19  Yes Tower, Wynelle Fanny, MD    Allergies Amoxicillin, Amoxicillin-pot clavulanate, Esomeprazole magnesium,  Gabapentin, Omeprazole, and Ranitidine hcl  Family History  Problem Relation Age of Onset   Hypertension Father    Cancer Father 52       esophageal - smoker   Leukemia Mother    Cancer Mother 62       leukemia   Cancer Brother 34       breast CA   Breast cancer Brother 85   Cancer Maternal Aunt 80       leukemia   Cancer Paternal Aunt 78       breast   Breast cancer Paternal Aunt 26   Breast cancer Maternal Grandmother 41   Breast cancer Sister 106  Social History Social History   Tobacco Use   Smoking status: Never   Smokeless tobacco: Never   Tobacco comments:    non smoker  Vaping Use   Vaping Use: Never used  Substance Use Topics   Alcohol use: No    Alcohol/week: 0.0 standard drinks   Drug use: No     Review of Systems  Constitutional: No fever/chills Eyes:  No discharge ENT: No upper respiratory complaints. Respiratory: no cough. No SOB/ use of accessory muscles to breath Gastrointestinal:   No nausea, no vomiting.  No diarrhea.  No constipation. Musculoskeletal: Negative for musculoskeletal pain. Skin: Negative for rash, abrasions, lacerations, ecchymosis.    ____________________________________________   PHYSICAL EXAM:  VITAL SIGNS: ED Triage Vitals  Enc Vitals Group     BP 01/19/21 1230 (!) 171/85     Pulse Rate 01/19/21 1230 79     Resp 01/19/21 1230 16     Temp 01/19/21 1230 98.7 F (37.1 C)     Temp Source 01/19/21 1230 Oral     SpO2 01/19/21 1230 97 %     Weight --      Height --      Head Circumference --      Peak Flow --      Pain Score 01/19/21 1232 7     Pain Loc --      Pain Edu? --      Excl. in [place]? --      Constitutional: Alert and oriented. Well appearing and in no acute distress. Eyes: Conjunctivae are normal. PERRL. EOMI. Head: Atraumatic. ENT      Nose: No congestion/rhinnorhea.      Mouth/Throat: Mucous membranes are moist.  Neck: No stridor.  No cervical spine tenderness to palpation. Cardiovascular:  Normal rate, regular rhythm. Normal S1 and S2.  Good peripheral circulation. Respiratory: Normal respiratory effort without tachypnea or retractions. Lungs CTAB. Good air entry to the bases with no decreased or absent breath sounds Gastrointestinal: Bowel sounds x 4 quadrants.  Patient has right upper quadrant abdominal tenderness with guarding. Musculoskeletal: Full range of motion to all extremities. No obvious deformities noted Neurologic:  Normal for age. No gross focal neurologic deficits are appreciated.  Skin:  Skin is warm, dry and intact. No rash noted. Psychiatric: Mood and affect are normal for age. Speech and behavior are normal.   ____________________________________________   LABS (all labs ordered are listed, but only abnormal results are displayed)  Labs Reviewed - No data to display ____________________________________________  EKG   ____________________________________________  RADIOLOGY   No results found.  ____________________________________________    PROCEDURES  Procedure(s) performed:     Procedures     Medications - No data to display   ____________________________________________   INITIAL IMPRESSION / ASSESSMENT AND PLAN / ED COURSE  Pertinent labs & imaging results that were available during my care of the patient were reviewed by me and considered in my medical decision making (see chart for details).      Assessment and plan Right upper quadrant abdominal pain 80 year old female presents to the urgent care with right upper quadrant abdominal pain for the past 5 days.  Given complex past medical history and limited work-up capacity in the urgent care, patient was referred to the emergency department for labs and imaging.     ____________________________________________  FINAL CLINICAL IMPRESSION(S) / ED DIAGNOSES  Final diagnoses:  Right upper quadrant abdominal pain      NEW MEDICATIONS STARTED DURING THIS  VISIT:  ED Discharge Orders     None           This chart was dictated using voice recognition software/Dragon. Despite best efforts to proofread, errors can occur which can change the meaning. Any change was purely unintentional.     Lannie Fields, PA-C 01/19/21 1343

## 2021-01-28 ENCOUNTER — Emergency Department (HOSPITAL_COMMUNITY): Payer: Medicare Other

## 2021-01-28 ENCOUNTER — Emergency Department (HOSPITAL_COMMUNITY)
Admission: EM | Admit: 2021-01-28 | Discharge: 2021-01-28 | Disposition: A | Discharge status: Home / Self Care | Payer: Medicare Other | Attending: Emergency Medicine | Admitting: Emergency Medicine

## 2021-01-28 ENCOUNTER — Encounter (HOSPITAL_COMMUNITY): Payer: Self-pay

## 2021-01-28 DIAGNOSIS — R0789 Other chest pain: Secondary | ICD-10-CM | POA: Insufficient documentation

## 2021-01-28 DIAGNOSIS — Z86711 Personal history of pulmonary embolism: Secondary | ICD-10-CM | POA: Diagnosis not present

## 2021-01-28 DIAGNOSIS — R079 Chest pain, unspecified: Secondary | ICD-10-CM | POA: Diagnosis not present

## 2021-01-28 DIAGNOSIS — Z853 Personal history of malignant neoplasm of breast: Secondary | ICD-10-CM | POA: Insufficient documentation

## 2021-01-28 DIAGNOSIS — Z79899 Other long term (current) drug therapy: Secondary | ICD-10-CM | POA: Diagnosis not present

## 2021-01-28 DIAGNOSIS — I10 Essential (primary) hypertension: Secondary | ICD-10-CM | POA: Insufficient documentation

## 2021-01-28 DIAGNOSIS — Z7901 Long term (current) use of anticoagulants: Secondary | ICD-10-CM | POA: Diagnosis not present

## 2021-01-28 DIAGNOSIS — Z0389 Encounter for observation for other suspected diseases and conditions ruled out: Secondary | ICD-10-CM | POA: Diagnosis not present

## 2021-01-28 LAB — CBC WITH DIFFERENTIAL/PLATELET
Abs Immature Granulocytes: 0.01 10*3/uL (ref 0.00–0.07)
Basophils Absolute: 0.1 10*3/uL (ref 0.0–0.1)
Basophils Relative: 1 %
Eosinophils Absolute: 0 10*3/uL (ref 0.0–0.5)
Eosinophils Relative: 1 %
HCT: 44.5 % (ref 36.0–46.0)
Hemoglobin: 14.4 g/dL (ref 12.0–15.0)
Immature Granulocytes: 0 %
Lymphocytes Relative: 26 %
Lymphs Abs: 1.3 10*3/uL (ref 0.7–4.0)
MCH: 29.4 pg (ref 26.0–34.0)
MCHC: 32.4 g/dL (ref 30.0–36.0)
MCV: 91 fL (ref 80.0–100.0)
Monocytes Absolute: 0.5 10*3/uL (ref 0.1–1.0)
Monocytes Relative: 9 %
Neutro Abs: 3.2 10*3/uL (ref 1.7–7.7)
Neutrophils Relative %: 63 %
Platelets: 224 10*3/uL (ref 150–400)
RBC: 4.89 MIL/uL (ref 3.87–5.11)
RDW: 14.8 % (ref 11.5–15.5)
WBC: 5.1 10*3/uL (ref 4.0–10.5)
nRBC: 0 % (ref 0.0–0.2)

## 2021-01-28 LAB — COMPREHENSIVE METABOLIC PANEL
ALT: 19 U/L (ref 0–44)
AST: 27 U/L (ref 15–41)
Albumin: 4 g/dL (ref 3.5–5.0)
Alkaline Phosphatase: 71 U/L (ref 38–126)
Anion gap: 7 (ref 5–15)
BUN: 18 mg/dL (ref 8–23)
CO2: 26 mmol/L (ref 22–32)
Calcium: 9.4 mg/dL (ref 8.9–10.3)
Chloride: 107 mmol/L (ref 98–111)
Creatinine, Ser: 1.03 mg/dL — ABNORMAL HIGH (ref 0.44–1.00)
GFR, Estimated: 55 mL/min — ABNORMAL LOW (ref >60)
Glucose, Bld: 92 mg/dL (ref 70–99)
Potassium: 3.8 mmol/L (ref 3.5–5.1)
Sodium: 140 mmol/L (ref 135–145)
Total Bilirubin: 0.7 mg/dL (ref 0.3–1.2)
Total Protein: 7.1 g/dL (ref 6.5–8.1)

## 2021-01-28 LAB — TROPONIN I (HIGH SENSITIVITY)
Troponin I (High Sensitivity): 3 ng/L (ref <18)
Troponin I (High Sensitivity): 4 ng/L (ref <18)

## 2021-01-28 LAB — MAGNESIUM: Magnesium: 2.5 mg/dL — ABNORMAL HIGH (ref 1.7–2.4)

## 2021-01-28 MED ORDER — IOHEXOL 350 MG/ML SOLN
80.0000 mL | Freq: Once | INTRAVENOUS | Status: AC | PRN
  Administered 2021-01-28: 80 mL via INTRAVENOUS

## 2021-01-28 MED ORDER — HYDROCODONE-ACETAMINOPHEN 5-325 MG PO TABS
1.0000 | ORAL_TABLET | Freq: Once | ORAL | Status: AC
Start: 2021-01-28 — End: 2021-01-28
  Administered 2021-01-28: 1 via ORAL
  Filled 2021-01-28: qty 1

## 2021-01-28 MED ORDER — METHOCARBAMOL 500 MG PO TABS: 500.0000 mg | ORAL_TABLET | Freq: Two times a day (BID) | ORAL | 0 refills | Status: AC

## 2021-01-28 MED ORDER — LACTATED RINGERS IV BOLUS
500.0000 mL | Freq: Once | INTRAVENOUS | Status: AC
  Administered 2021-01-28: 500 mL via INTRAVENOUS

## 2021-01-28 MED ORDER — KETOROLAC TROMETHAMINE 15 MG/ML IJ SOLN
15.0000 mg | Freq: Once | INTRAMUSCULAR | Status: AC
  Administered 2021-01-28: 15 mg via INTRAVENOUS
  Filled 2021-01-28: qty 1

## 2021-01-28 MED ORDER — METHOCARBAMOL 500 MG PO TABS
500.0000 mg | ORAL_TABLET | Freq: Once | ORAL | Status: AC
  Administered 2021-01-28: 500 mg via ORAL
  Filled 2021-01-28: qty 1

## 2021-01-28 MED ORDER — OXYCODONE HCL 5 MG PO TABS: 5.0000 mg | ORAL_TABLET | Freq: Four times a day (QID) | ORAL | 0 refills | Status: AC | PRN

## 2021-01-28 MED ORDER — HYDROCODONE-ACETAMINOPHEN 5-325 MG PO TABS
1.0000 | ORAL_TABLET | Freq: Once | ORAL | Status: AC
  Administered 2021-01-28: 1 via ORAL
  Filled 2021-01-28: qty 1

## 2021-01-28 NOTE — Discharge Instructions (Addendum)
Continue to take ibuprofen and Tylenol for management of your pain.  Additionally, there were prescriptions sent to your pharmacy [facility] drug on [facility]).  Robaxin is a muscle relaxer.  Oxycodone is a narcotic.  Either 1 of these can put you at increased risk of falls.  Please take only as needed and with caution, especially when taking together.   CT chest without any acute findings.  Heart markers x2 were normal.

## 2021-01-28 NOTE — ED Provider Notes (Signed)
[facility] DEPT Provider Note   CSN: [id number] Arrival date & time: 01/28/21  8937     History Chief Complaint  Patient presents with   Rib cage pain    Sarah Young is a 80 y.o. female.  HPI Patient presents for 1 week of right-sided chest wall pain.  She has a history of breast cancer (in remission), HTN, PE (currently on Xarelto).  She denies any recent traumas or straining events.  Onset of pain was gradual and unexpected.  She describes it as radiating along the line of her lower right-sided rib.  Pain is worsened with bending, rotational movements, and palpation.  She has not had any associated shortness of breath, fevers, chills, nausea.  She has treated her pain with Tylenol and does experience some relief from this.  She was seen in urgent care 5 days ago.  At that time, she was advised to go to the ED for further testing.  She decided to go home instead and see if pain would resolve on its own.  Today she reports that pain has been persistent and she does feel that it is worsening.    Past Medical History:  Diagnosis Date   Anxiety    Arthritis    OA of hands   Breast cancer of upper-outer quadrant of right female breast ([place]) 05/08/2014   DVT (deep venous thrombosis) (HCC) 1980s   Fibromyalgia    Hyperlipidemia    Hypertension    Irritable bowel syndrome    Migraine    Osteopenia    Personal history of radiation therapy    Pulmonary embolus ([place]) 2021   S/P radiation therapy 06/2014   Right breast 4250 cGy in 17 sessions, right breast boost 1200 cGy in 6 sessions  = 07/11/2014 through 08/12/2014                         Vitamin D deficiency    Wears glasses     Patient Active Problem List   Diagnosis Date Noted   Medicare annual wellness visit, subsequent 08/14/2020   Aortic atherosclerosis ([place]) 08/23/2019   Dizziness 07/11/2019   Deep vein thrombosis (DVT) of left lower extremity ([place]) 05/25/2019   Hypercoagulopathy  ([place]) 05/22/2019   History of pulmonary embolism 05/07/2019   Chest pain of unknown etiology -low risk for Cardiac Etiology 04/06/2019   SOB (shortness of breath) on exertion 04/06/2019   Osteopenia 05/02/2018   Routine general medical examination at a health care facility 11/21/2016   Estrogen deficiency 11/19/2016   Renal insufficiency 11/19/2016   Pedal edema 12/15/2015   Family history of breast cancer in female 05/17/2014   Malignant neoplasm of upper-outer quadrant of right breast in female, estrogen receptor positive ([place]) 05/08/2014   GERD (gastroesophageal reflux disease) 03/01/2014   Lumbar degenerative disc disease 08/15/2013   Chronic pain of scapula 08/15/2013   Essential hypertension 10/02/2008   Migraine 09/06/2008   Vitamin D deficiency 06/27/2007   HYPERLIPIDEMIA 05/11/2007   Generalized anxiety disorder 05/11/2007   Disorder of bone and cartilage 05/11/2007   H/O poliomyelitis 05/08/2007   OSTEOARTHRITIS, HANDS, BILATERAL 05/08/2007   Fibromyalgia 05/08/2007   URINARY INCONTINENCE, STRESS, MILD 05/08/2007    Past Surgical History:  Procedure Laterality Date   ABDOMINAL HYSTERECTOMY  1972   APPENDECTOMY     BREAST EXCISIONAL BIOPSY Left    BREAST LUMPECTOMY Right 2016   BREAST SURGERY  1995   lumpectomy fibrocystic breast-lt  CATARACT EXTRACTION     both   CHOLECYSTECTOMY  2005   COLONOSCOPY     ovarian cyst removed  1985   RADIOACTIVE SEED GUIDED PARTIAL MASTECTOMY WITH AXILLARY SENTINEL LYMPH NODE BIOPSY Right 05/30/2014   Procedure: RADIOACTIVE SEED GUIDED PARTIAL MASTECTOMY WITH AXILLARY SENTINEL LYMPH NODE BIOPSY;  Surgeon: Autumn Messing III, MD;  Location: [facility];  Service: General;  Laterality: Right;     OB History   No obstetric history on file.     Family History  Problem Relation Age of Onset   Hypertension Father    Cancer Father 65       esophageal - smoker   Leukemia Mother    Cancer Mother 75       leukemia   Cancer  Brother 20       breast CA   Breast cancer Brother 40   Cancer Maternal Aunt 36       leukemia   Cancer Paternal Aunt 78       breast   Breast cancer Paternal Aunt 8   Breast cancer Maternal Grandmother 81   Breast cancer Sister 53    Social History   Tobacco Use   Smoking status: Never   Smokeless tobacco: Never   Tobacco comments:    non smoker  Vaping Use   Vaping Use: Never used  Substance Use Topics   Alcohol use: No    Alcohol/week: 0.0 standard drinks   Drug use: No    Home Medications Prior to Admission medications   Medication Sig Start Date End Date Taking? Authorizing Provider  acetaminophen (TYLENOL) 500 MG tablet Take 1,000 mg by mouth every 6 (six) hours as needed for mild pain (or headaches).   Yes [provider]  amitriptyline (ELAVIL) 10 MG tablet TAKE 2 TABLETS (20 MG TOTAL) BY MOUTH AT BEDTIME. Patient taking differently: Take 10-20 mg by mouth at bedtime as needed for sleep. 08/14/20  Yes Tower, Wynelle Fanny, MD  amLODipine (NORVASC) 5 MG tablet Take 1 tablet (5 mg total) by mouth daily. 08/14/20  Yes Tower, Wynelle Fanny, MD  Ascorbic Acid (VITAMIN C PO) Take 1 tablet by mouth every morning.   Yes [provider]  Calcium Carb-Cholecalciferol (CALCIUM + D3 PO) Take 1 tablet by mouth in the morning.   Yes [provider]  Cholecalciferol (VITAMIN D-3) 25 MCG (1000 UT) CAPS Take 1,000 Units by mouth daily.   Yes [provider]  Cyanocobalamin (VITAMIN B-12 PO) Take 1 tablet by mouth every morning.   Yes [provider]  methocarbamol (ROBAXIN) 500 MG tablet Take 1 tablet (500 mg total) by mouth 2 (two) times daily for 5 days. 01/28/21 02/02/21 Yes Godfrey Pick, MD  Multiple Vitamins-Minerals (ZINC PO) Take 1 tablet by mouth 2 (two) times a week.   Yes [provider]  oxyCODONE (ROXICODONE) 5 MG immediate release tablet Take 1 tablet (5 mg total) by mouth every 6 (six) hours as needed for up to 4 days for severe pain.  01/28/21 02/01/21 Yes Godfrey Pick, MD  Polyethyl Glycol-Propyl Glycol (SYSTANE) 0.4-0.3 % SOLN Place 1 drop into both eyes 2 (two) times daily.   Yes [provider]  rivaroxaban (XARELTO) 20 MG TABS tablet Take 1 tablet (20 mg total) by mouth daily with supper. 03/26/20  Yes Tower, Wynelle Fanny, MD  SUMAtriptan (IMITREX) 100 MG tablet TAKE 1 TABLET BY MOUTH AS NEEDED FOR MIGRAINE, MAY REPEAT IN 2 HOURS IF NEEDED. MAX 2  TABS IN 24 HOURS. Patient taking differently: Take 100 mg by mouth See admin instructions. Take 100 mg by mouth as needed for migraines- may repeat once in 2 hours, as needed (max of 2 tablets/200 mg in 24 hours) 12/28/20  Yes Tower, Wynelle Fanny, MD  ketoconazole (NIZORAL) 2 % cream Apply 1 application topically daily. Patient not taking: Reported on 01/28/2021 11/27/19   Magrinat, Virgie Dad, MD  triamcinolone cream (KENALOG) 0.1 % Apply 1 application topically 2 (two) times daily. To affected area (insect sting) Patient not taking: Reported on 01/28/2021 11/19/19   Tower, Wynelle Fanny, MD    Allergies    Amoxicillin, Amoxicillin-pot clavulanate, Esomeprazole magnesium, Gabapentin, Omeprazole, Other, Ranitidine hcl, and Wasp venom  Review of Systems   Review of Systems  Constitutional:  Negative for chills and fever.  HENT:  Negative for congestion, ear pain and sore throat.   Eyes:  Negative for pain and visual disturbance.  Respiratory:  Negative for cough, chest tightness, shortness of breath and wheezing.   Cardiovascular:  Positive for chest pain (Right-sided chest wall pain). Negative for palpitations and leg swelling.  Gastrointestinal:  Negative for abdominal pain, diarrhea, nausea and vomiting.  Genitourinary:  Negative for dysuria, flank pain, hematuria and pelvic pain.  Musculoskeletal:  Negative for arthralgias, back pain, gait problem, joint swelling and neck pain.  Skin:  Negative for color change and rash.  Neurological:  Negative for dizziness, seizures, syncope, weakness,  light-headedness, numbness and headaches.  Hematological:  Bruises/bleeds easily (On Xarelto).  Psychiatric/Behavioral:  Negative for confusion and decreased concentration.   All other systems reviewed and are negative.  Physical Exam Updated Vital Signs BP (!) 130/55   Pulse 78   Temp 97.9 F (36.6 C) (Oral)   Resp 15   Ht 5\' 5"  (1.651 m)   Wt 68.5 kg   SpO2 95%   BMI 25.13 kg/m   Physical Exam Vitals and nursing note reviewed.  Constitutional:      General: She is not in acute distress.    Appearance: Normal appearance. She is well-developed and normal weight. She is not ill-appearing, toxic-appearing or diaphoretic.  HENT:     Head: Normocephalic and atraumatic.     Right Ear: External ear normal.     Left Ear: External ear normal.     Nose: Nose normal.  Eyes:     Conjunctiva/sclera: Conjunctivae normal.  Cardiovascular:     Rate and Rhythm: Normal rate and regular rhythm.     Heart sounds: No murmur heard. Pulmonary:     Effort: Pulmonary effort is normal. No respiratory distress.     Breath sounds: Normal breath sounds. No wheezing or rales.  Chest:     Chest wall: Tenderness (Lower right side chest wall) present.  Abdominal:     Palpations: Abdomen is soft.     Tenderness: There is no abdominal tenderness.  Musculoskeletal:     Cervical back: Normal range of motion and neck supple. No rigidity.     Right lower leg: No edema.     Left lower leg: No edema.  Skin:    General: Skin is warm and dry.     Coloration: Skin is not jaundiced or pale.  Neurological:     General: No focal deficit present.     Mental Status: She is alert and oriented to person, place, and time.     Cranial Nerves: No cranial nerve deficit.     Sensory: No sensory deficit.  Motor: No weakness.  Psychiatric:        Mood and Affect: Mood normal.        Behavior: Behavior normal.        Thought Content: Thought content normal.        Judgment: Judgment normal.    ED Results /  Procedures / Treatments   Labs (all labs ordered are listed, but only abnormal results are displayed) Labs Reviewed  COMPREHENSIVE METABOLIC PANEL - Abnormal; Notable for the following components:      Result Value   Creatinine, Ser 1.03 (*)    GFR, Estimated 55 (*)    All other components within normal limits  MAGNESIUM - Abnormal; Notable for the following components:   Magnesium 2.5 (*)    All other components within normal limits  CBC WITH DIFFERENTIAL/PLATELET  TROPONIN I (HIGH SENSITIVITY)  TROPONIN I (HIGH SENSITIVITY)    EKG EKG Interpretation  Date/Time:  Wednesday January 28 2021 13:58:20 EDT Ventricular Rate:  75 PR Interval:  210 QRS Duration: 98 QT Interval:  388 QTC Calculation: 434 R Axis:   -14 Text Interpretation: Sinus rhythm Low voltage, precordial leads Confirmed by Godfrey Pick ([id number]) on 01/28/2021 2:01:44 PM  Radiology CT Angio Chest PE W and/or Wo Contrast  Result Date: 01/28/2021 CLINICAL DATA:  PE suspected, high probability EXAM: CT ANGIOGRAPHY CHEST WITH CONTRAST TECHNIQUE: Multidetector CT imaging of the chest was performed using the standard protocol during bolus administration of intravenous contrast. Multiplanar CT image reconstructions and MIPs were obtained to evaluate the vascular anatomy. CONTRAST:  65mL OMNIPAQUE IOHEXOL 350 MG/ML SOLN COMPARISON:  None. FINDINGS: Cardiovascular: No filling defects within the pulmonary arteries to suggest acute pulmonary embolism. Mediastinum/Nodes: No axillary or supraclavicular adenopathy. No mediastinal or hilar adenopathy. No pericardial fluid. Esophagus normal. Lungs/Pleura: No pulmonary infarction. No pneumonia. No suspicious nodular airways normal Upper Abdomen: Limited view of the liver, kidneys, pancreas are unremarkable. Normal adrenal glands. Musculoskeletal: No aggressive osseous lesion. Review of the MIP images confirms the above findings. IMPRESSION: 1. No evidence acute pulmonary embolism. 2. No acute  pulmonary parenchymal findings. Electronically Signed   By: Suzy Bouchard M.D.   On: 01/28/2021 16:28   DG Chest Portable 1 View  Result Date: 01/28/2021 CLINICAL DATA:  Right sided chest pain for 1 week. EXAM: PORTABLE CHEST 1 VIEW COMPARISON:  06/09/2020 FINDINGS: The heart size and mediastinal contours are within normal limits. Both lungs are clear. Surgical clips again noted in right breast. IMPRESSION: No active disease. Electronically Signed   By: Marlaine Hind M.D.   On: 01/28/2021 13:40    Procedures Procedures   Medications Ordered in ED Medications  lactated ringers bolus 500 mL (0 mLs Intravenous Stopped 01/28/21 1823)  ketorolac (TORADOL) 15 MG/ML injection 15 mg (15 mg Intravenous Given 01/28/21 1433)  HYDROcodone-acetaminophen (NORCO/VICODIN) 5-325 MG per tablet 1 tablet (1 tablet Oral Given 01/28/21 1424)  methocarbamol (ROBAXIN) tablet 500 mg (500 mg Oral Given 01/28/21 1424)  iohexol (OMNIPAQUE) 350 MG/ML injection 80 mL (80 mLs Intravenous Contrast Given 01/28/21 1608)    ED Course  I have reviewed the triage vital signs and the nursing notes.  Pertinent labs & imaging results that were available during my care of the patient were reviewed by me and considered in my medical decision making (see chart for details).    MDM Rules/Calculators/A&P  Patient presents for 1 week of persistent right-sided chest wall pain.  She has experienced some relief with Tylenol at home but pain recurs.  She has not tried any other medications for analgesia.  On arrival in the ED, she is well-appearing.  Vital signs are normal.  On exam, she does have tenderness to her lower right-sided chest wall.  This does suggest a musculoskeletal etiology.  Given her age and history of breast cancer, laboratory work-up was initiated.  Patient was given medications for multimodal pain control.  Lab work was reassuring.  CTA of chest was ordered.  On reassessment, patient reported  significant improvement in her pain.  At time of signout, results of CTA were pending.  Care of patient was signed out to oncoming ED provider.  Final Clinical Impression(s) / ED Diagnoses Final diagnoses:  Chest wall pain    Rx / DC Orders ED Discharge Orders          Ordered    methocarbamol (ROBAXIN) 500 MG tablet  2 times daily        01/28/21 1606    oxyCODONE (ROXICODONE) 5 MG immediate release tablet  Every 6 hours PRN        01/28/21 1606             Godfrey Pick, MD 01/28/21 1836

## 2021-01-28 NOTE — ED Provider Notes (Signed)
Patient's troponins x2 without any significant changes or elevation.  And CT angio chest without evidence of pulmonary embolus or any other acute findings.  Patient stable for discharge home.   Fredia Sorrow, MD 01/28/21 1902

## 2021-01-28 NOTE — ED Triage Notes (Signed)
Pt presents with c/o right rib cage pain that wraps around her right breast and right shoulder. Pain has been present for approx one week. Pt is in remission from breast cancer approx 6 months. Pt went to her PCP last week and was told to come to the ER for blood work but declined at the time because of local hospital wait times.

## 2021-01-31 ENCOUNTER — Encounter: Payer: Self-pay | Admitting: Emergency Medicine

## 2021-01-31 ENCOUNTER — Other Ambulatory Visit: Payer: Self-pay

## 2021-01-31 ENCOUNTER — Ambulatory Visit
Admission: EM | Admit: 2021-01-31 | Discharge: 2021-01-31 | Disposition: A | Discharge status: Home / Self Care | Payer: Medicare Other | Attending: Emergency Medicine | Admitting: Emergency Medicine

## 2021-01-31 DIAGNOSIS — B029 Zoster without complications: Secondary | ICD-10-CM

## 2021-01-31 MED ORDER — VALACYCLOVIR HCL 1 G PO TABS: 1000.0000 mg | ORAL_TABLET | Freq: Three times a day (TID) | ORAL | 0 refills | Status: DC

## 2021-01-31 NOTE — ED Provider Notes (Signed)
Sarah Young    CSN: [id number] Arrival date & time: 01/31/21  1253      History   Chief Complaint Chief Complaint  Patient presents with   Rash    HPI Sarah Young is a 80 y.o. female.  Patient presents with 2-day history of painful pruritic blisterlike rash on right flank.  She denies fever, cough, shortness of breath, or other symptoms.  Treatment at home with Tylenol.  She states this rash is similar to previous episode of shingles.  She has had the shingles vaccine.  The history is provided by the patient and medical records.   Past Medical History:  Diagnosis Date   Anxiety    Arthritis    OA of hands   Breast cancer of upper-outer quadrant of right female breast ([place]) 05/08/2014   DVT (deep venous thrombosis) (HCC) 1980s   Fibromyalgia    Hyperlipidemia    Hypertension    Irritable bowel syndrome    Migraine    Osteopenia    Personal history of radiation therapy    Pulmonary embolus ([place]) 2021   S/P radiation therapy 06/2014   Right breast 4250 cGy in 17 sessions, right breast boost 1200 cGy in 6 sessions  = 07/11/2014 through 08/12/2014                         Vitamin D deficiency    Wears glasses     Patient Active Problem List   Diagnosis Date Noted   Medicare annual wellness visit, subsequent 08/14/2020   Aortic atherosclerosis ([place]) 08/23/2019   Dizziness 07/11/2019   Deep vein thrombosis (DVT) of left lower extremity ([place]) 05/25/2019   Hypercoagulopathy ([place]) 05/22/2019   History of pulmonary embolism 05/07/2019   Chest pain of unknown etiology -low risk for Cardiac Etiology 04/06/2019   SOB (shortness of breath) on exertion 04/06/2019   Osteopenia 05/02/2018   Routine general medical examination at a health care facility 11/21/2016   Estrogen deficiency 11/19/2016   Renal insufficiency 11/19/2016   Pedal edema 12/15/2015   Family history of breast cancer in female 05/17/2014   Malignant neoplasm of upper-outer quadrant of right  breast in female, estrogen receptor positive ([place]) 05/08/2014   GERD (gastroesophageal reflux disease) 03/01/2014   Lumbar degenerative disc disease 08/15/2013   Chronic pain of scapula 08/15/2013   Essential hypertension 10/02/2008   Migraine 09/06/2008   Vitamin D deficiency 06/27/2007   HYPERLIPIDEMIA 05/11/2007   Generalized anxiety disorder 05/11/2007   Disorder of bone and cartilage 05/11/2007   H/O poliomyelitis 05/08/2007   OSTEOARTHRITIS, HANDS, BILATERAL 05/08/2007   Fibromyalgia 05/08/2007   URINARY INCONTINENCE, STRESS, MILD 05/08/2007    Past Surgical History:  Procedure Laterality Date   ABDOMINAL HYSTERECTOMY  1972   APPENDECTOMY     BREAST EXCISIONAL BIOPSY Left    BREAST LUMPECTOMY Right 2016   BREAST SURGERY  1995   lumpectomy fibrocystic breast-lt   CATARACT EXTRACTION     both   CHOLECYSTECTOMY  2005   COLONOSCOPY     ovarian cyst removed  [place] SENTINEL LYMPH NODE BIOPSY Right 05/30/2014   Procedure: RADIOACTIVE SEED GUIDED PARTIAL MASTECTOMY WITH AXILLARY SENTINEL LYMPH NODE BIOPSY;  Surgeon: Autumn Messing III, MD;  Location: [facility];  Service: General;  Laterality: Right;    OB History   No obstetric history on file.      Home Medications  Prior to Admission medications   Medication Sig Start Date End Date Taking? Authorizing Provider  valACYclovir (VALTREX) 1000 MG tablet Take 1 tablet (1,000 mg total) by mouth 3 (three) times daily. 01/31/21  Yes Sharion Balloon, NP  acetaminophen (TYLENOL) 500 MG tablet Take 1,000 mg by mouth every 6 (six) hours as needed for mild pain (or headaches).    [provider]  amitriptyline (ELAVIL) 10 MG tablet TAKE 2 TABLETS (20 MG TOTAL) BY MOUTH AT BEDTIME. Patient taking differently: Take 10-20 mg by mouth at bedtime as needed for sleep. 08/14/20   Tower, Wynelle Fanny, MD  amLODipine (NORVASC) 5 MG tablet Take 1 tablet (5 mg total) by mouth  daily. 08/14/20   Tower, Wynelle Fanny, MD  Ascorbic Acid (VITAMIN C PO) Take 1 tablet by mouth every morning.    [provider]  Calcium Carb-Cholecalciferol (CALCIUM + D3 PO) Take 1 tablet by mouth in the morning.    [provider]  Cholecalciferol (VITAMIN D-3) 25 MCG (1000 UT) CAPS Take 1,000 Units by mouth daily.    [provider]  Cyanocobalamin (VITAMIN B-12 PO) Take 1 tablet by mouth every morning.    [provider]  ketoconazole (NIZORAL) 2 % cream Apply 1 application topically daily. Patient not taking: Reported on 01/28/2021 11/27/19   Magrinat, Virgie Dad, MD  methocarbamol (ROBAXIN) 500 MG tablet Take 1 tablet (500 mg total) by mouth 2 (two) times daily for 5 days. 01/28/21 02/02/21  Godfrey Pick, MD  Multiple Vitamins-Minerals (ZINC PO) Take 1 tablet by mouth 2 (two) times a week.    [provider]  oxyCODONE (ROXICODONE) 5 MG immediate release tablet Take 1 tablet (5 mg total) by mouth every 6 (six) hours as needed for up to 4 days for severe pain. 01/28/21 02/01/21  Godfrey Pick, MD  Polyethyl Glycol-Propyl Glycol (SYSTANE) 0.4-0.3 % SOLN Place 1 drop into both eyes 2 (two) times daily.    [provider]  rivaroxaban (XARELTO) 20 MG TABS tablet Take 1 tablet (20 mg total) by mouth daily with supper. 03/26/20   Tower, Wynelle Fanny, MD  SUMAtriptan (IMITREX) 100 MG tablet TAKE 1 TABLET BY MOUTH AS NEEDED FOR MIGRAINE, MAY REPEAT IN 2 HOURS IF NEEDED. MAX 2 TABS IN 24 HOURS. Patient taking differently: Take 100 mg by mouth See admin instructions. Take 100 mg by mouth as needed for migraines- may repeat once in 2 hours, as needed (max of 2 tablets/200 mg in 24 hours) 12/28/20   Tower, Wynelle Fanny, MD  triamcinolone cream (KENALOG) 0.1 % Apply 1 application topically 2 (two) times daily. To affected area (insect sting) Patient not taking: Reported on 01/28/2021 11/19/19   Tower, Wynelle Fanny, MD    Family History Family History  Problem Relation Age of Onset    Hypertension Father    Cancer Father 69       esophageal - smoker   Leukemia Mother    Cancer Mother 39       leukemia   Cancer Brother 60       breast CA   Breast cancer Brother 8   Cancer Maternal Aunt 80       leukemia   Cancer Paternal Aunt 78       breast   Breast cancer Paternal Aunt 81   Breast cancer Maternal Grandmother 80   Breast cancer Sister 74    Social History Social History   Tobacco Use   Smoking status: Never  Smokeless tobacco: Never   Tobacco comments:    non smoker  Vaping Use   Vaping Use: Never used  Substance Use Topics   Alcohol use: No    Alcohol/week: 0.0 standard drinks   Drug use: No     Allergies   Amoxicillin, Amoxicillin-pot clavulanate, Esomeprazole magnesium, Gabapentin, Omeprazole, Other, Ranitidine hcl, and Wasp venom   Review of Systems Review of Systems  Constitutional:  Negative for chills and fever.  Respiratory:  Negative for cough and shortness of breath.   Cardiovascular:  Negative for chest pain and palpitations.  Skin:  Positive for rash. Negative for color change.  All other systems reviewed and are negative.   Physical Exam Triage Vital Signs ED Triage Vitals  Enc Vitals Group     BP      Pulse      Resp      Temp      Temp src      SpO2      Weight      Height      Head Circumference      Peak Flow      Pain Score      Pain Loc      Pain Edu?      Excl. in [place]?    No data found.  Updated Vital Signs BP (!) 149/77 (BP Location: Left Arm)   Pulse 80   Temp 98.3 F (36.8 C) (Oral)   Resp 18   SpO2 97%   Visual Acuity Right Eye Distance:   Left Eye Distance:   Bilateral Distance:    Right Eye Near:   Left Eye Near:    Bilateral Near:     Physical Exam Vitals and nursing note reviewed.  Constitutional:      General: She is not in acute distress.    Appearance: She is well-developed.  HENT:     Head: Normocephalic and atraumatic.     Mouth/Throat:     Mouth: Mucous membranes are  moist.  Eyes:     Conjunctiva/sclera: Conjunctivae normal.  Cardiovascular:     Rate and Rhythm: Normal rate and regular rhythm.     Heart sounds: No murmur heard. Pulmonary:     Effort: Pulmonary effort is normal. No respiratory distress.     Breath sounds: Normal breath sounds.  Abdominal:     Palpations: Abdomen is soft.     Tenderness: There is no abdominal tenderness.  Musculoskeletal:     Cervical back: Neck supple.  Skin:    General: Skin is warm and dry.     Findings: Rash present.     Comments: Erythematous patchy vesicular rash on right flank in dermatomal line.  Neurological:     Mental Status: She is alert.  Psychiatric:        Mood and Affect: Mood normal.        Behavior: Behavior normal.     UC Treatments / Results  Labs (all labs ordered are listed, but only abnormal results are displayed) Labs Reviewed - No data to display  EKG   Radiology No results found.  Procedures Procedures (including critical care time)  Medications Ordered in UC Medications - No data to display  Initial Impression / Assessment and Plan / UC Course  I have reviewed the triage vital signs and the nursing notes.  Pertinent labs & imaging results that were available during my care of the patient were reviewed by me and considered in my medical decision  making (see chart for details).  Shingles.  Treating with Valtrex.  Education on shingles provided.  Continue Tylenol as needed for discomfort.  Instructed patient to follow-up with her PCP on Monday.  She agrees to plan of care.   Final Clinical Impressions(s) / UC Diagnoses   Final diagnoses:  Herpes zoster without complication     Discharge Instructions      Take the Valtrex as directed.  Follow-up with your primary care provider on Monday.     ED Prescriptions     Medication Sig Dispense Auth. Provider   valACYclovir (VALTREX) 1000 MG tablet Take 1 tablet (1,000 mg total) by mouth 3 (three) times daily. 21  tablet Sharion Balloon, NP      PDMP not reviewed this encounter.   Sharion Balloon, NP 01/31/21 1415

## 2021-01-31 NOTE — Discharge Instructions (Addendum)
Take the Valtrex as directed.  Follow-up with your primary care provider on Monday.

## 2021-01-31 NOTE — ED Triage Notes (Signed)
Pt here with blistering rash under right breast spreading to right back x 2 days. Very painful and itchy

## 2021-02-02 ENCOUNTER — Other Ambulatory Visit: Payer: Self-pay

## 2021-02-02 ENCOUNTER — Ambulatory Visit: Payer: Medicare Other | Admitting: Family Medicine

## 2021-02-02 ENCOUNTER — Encounter: Payer: Self-pay | Admitting: Family Medicine

## 2021-02-02 ENCOUNTER — Telehealth: Payer: Self-pay

## 2021-02-02 DIAGNOSIS — B029 Zoster without complications: Secondary | ICD-10-CM | POA: Diagnosis not present

## 2021-02-02 NOTE — Telephone Encounter (Addendum)
Please see note below this access nurse note; pt already has appt to see Dr Glori Bickers 02/02/21 at [facility] - Client Nonclinical Telephone Record  AccessNurse Client [facility] Night - Client Client Site [facility] [place] Contact Type Call Who Is Calling Patient / Member / Family / Caregiver Caller Name [place] Phone Number [phone] Patient Name Sarah Young Patient DOB 02/26/1941 Call Type Message Only Information Provided Reason for Call Request to Schedule Office Appointment Initial Comment Has shingles, needs to make appt, pls call back, office is not answering past opening Patient request to speak to RN No Disp. Time Disposition Final User 02/02/2021 8:22:41 AM General Information Provided Yes Donato Heinz Call Closed By: Donato Heinz Transaction Date/Time: 02/02/2021 8:20:10 AM (ET

## 2021-02-02 NOTE — Telephone Encounter (Signed)
I spoke with pt; pt was seen at [facility] on 01/31/21 dx with shingles and pt was to FU with PCP on 02/02/21; pt said has rash on rt side under breast that goes down rt side to the rt back area. Pt scheduled in office appt with Dr Glori Bickers on 02/02/21 at 4 PM. UC & ED precautions given and pt voiced understanding. No covid per pt. Sending note to Dr Glori Bickers and [place] CMA.

## 2021-02-02 NOTE — Assessment & Plan Note (Addendum)
Right side T5-T6 dermatomes  Taking 7 day course of valcyclovir and tolerating  Reviewed her UC notes in detail Pain is moderate Discussed use of numbing/antiseptic spray prn Tylenol  oxycodone with caution of sedation and constipation  Keep rash clean  She does not tolerate gabapentin  May consider tramadol later Enc her to take 20 mg of amitriptyline at bedtime Update if worse or no improvement in 1-2 wk

## 2021-02-02 NOTE — Progress Notes (Signed)
Subjective:    Patient ID: Sarah Young, female    DOB: Aug 16, 1940, 80 y.o.   MRN: [id number]  This visit occurred during the SARS-CoV-2 public health emergency.  Safety protocols were in place, including screening questions prior to the visit, additional usage of staff PPE, and extensive cleaning of exam room while observing appropriate contact time as indicated for disinfecting solutions.   HPI Pt presents with shingles  Wt Readings from Last 3 Encounters:  02/02/21 151 lb (68.5 kg)  01/28/21 151 lb (68.5 kg)  08/14/20 151 lb 2 oz (68.5 kg)   25.13 kg/m  Symptoms started 5 days ago  Was seen at an urgent care and lesions were drying up  Seen on 01/31/21 with painful lesions on R flank  Treated with valtrex 1000 mg tid   Rash has spread just a little more towards mid chest  Started on her back  Very tender and painful  Has to hold her breast   She got some dermoplast spray for pain   Takes extra strength tylenol as needed  They gave her a small # of narcotics (16 pills of oxycodone 5 mg )   Cannot take gabapentin - nightmares in the past   Has never taken tramadol   Takes amitriptyline as needed when she cannot sleep No side effects   She had a shingles shot- one in the past  Shingles several times   Patient Active Problem List   Diagnosis Date Noted   Medicare annual wellness visit, subsequent 08/14/2020   Aortic atherosclerosis ([place]) 08/23/2019   Dizziness 07/11/2019   Deep vein thrombosis (DVT) of left lower extremity ([place]) 05/25/2019   Hypercoagulopathy ([place]) 05/22/2019   History of pulmonary embolism 05/07/2019   Chest pain of unknown etiology -low risk for Cardiac Etiology 04/06/2019   SOB (shortness of breath) on exertion 04/06/2019   Osteopenia 05/02/2018   Routine general medical examination at a health care facility 11/21/2016   Estrogen deficiency 11/19/2016   Renal insufficiency 11/19/2016   Pedal edema 12/15/2015   Family history of  breast cancer in female 05/17/2014   Malignant neoplasm of upper-outer quadrant of right breast in female, estrogen receptor positive ([place]) 05/08/2014   GERD (gastroesophageal reflux disease) 03/01/2014   Lumbar degenerative disc disease 08/15/2013   Chronic pain of scapula 08/15/2013   Essential hypertension 10/02/2008   Migraine 09/06/2008   Vitamin D deficiency 06/27/2007   HYPERLIPIDEMIA 05/11/2007   Generalized anxiety disorder 05/11/2007   Disorder of bone and cartilage 05/11/2007   H/O poliomyelitis 05/08/2007   OSTEOARTHRITIS, HANDS, BILATERAL 05/08/2007   Fibromyalgia 05/08/2007   URINARY INCONTINENCE, STRESS, MILD 05/08/2007   Past Medical History:  Diagnosis Date   Anxiety    Arthritis    OA of hands   Breast cancer of upper-outer quadrant of right female breast ([place]) 05/08/2014   DVT (deep venous thrombosis) ([place]) 1980s   Fibromyalgia    Hyperlipidemia    Hypertension    Irritable bowel syndrome    Migraine    Osteopenia    Personal history of radiation therapy    Pulmonary embolus ([place]) 2021   S/P radiation therapy 06/2014   Right breast 4250 cGy in 17 sessions, right breast boost 1200 cGy in 6 sessions  = 07/11/2014 through 08/12/2014                         Vitamin D deficiency    Wears glasses    Past  Surgical History:  Procedure Laterality Date   ABDOMINAL HYSTERECTOMY  1972   APPENDECTOMY     BREAST EXCISIONAL BIOPSY Left    BREAST LUMPECTOMY Right 2016   BREAST SURGERY  1995   lumpectomy fibrocystic breast-lt   CATARACT EXTRACTION     both   CHOLECYSTECTOMY  2005   COLONOSCOPY     ovarian cyst removed  1985   RADIOACTIVE SEED GUIDED PARTIAL MASTECTOMY WITH AXILLARY SENTINEL LYMPH NODE BIOPSY Right 05/30/2014   Procedure: RADIOACTIVE SEED GUIDED PARTIAL MASTECTOMY WITH AXILLARY SENTINEL LYMPH NODE BIOPSY;  Surgeon: Autumn Messing III, MD;  Location: [facility];  Service: General;  Laterality: Right;   Social History   Tobacco Use   Smoking  status: Never   Smokeless tobacco: Never   Tobacco comments:    non smoker  Vaping Use   Vaping Use: Never used  Substance Use Topics   Alcohol use: No    Alcohol/week: 0.0 standard drinks   Drug use: No   Family History  Problem Relation Age of Onset   Hypertension Father    Cancer Father 36       esophageal - smoker   Leukemia Mother    Cancer Mother 14       leukemia   Cancer Brother 68       breast CA   Breast cancer Brother 42   Cancer Maternal Aunt 80       leukemia   Cancer Paternal Aunt 78       breast   Breast cancer Paternal Aunt 51   Breast cancer Maternal Grandmother 7   Breast cancer Sister 76   Allergies  Allergen Reactions   Amoxicillin Itching and Other (See Comments)     Paradoxical Reaction- made infection worse    Amoxicillin-Pot Clavulanate Other (See Comments)    Felt like body turned "inside out"   Esomeprazole Magnesium Nausea And Vomiting   Gabapentin Other (See Comments)    Caused nightmares   Omeprazole Nausea And Vomiting   Other Itching, Swelling and Other (See Comments)    Ant venom = Made the site (where bitten) swell   Ranitidine Hcl Other (See Comments)    "Not effective"   Wasp Venom Swelling and Other (See Comments)    Made leg swell where stung   Current Outpatient Medications on File Prior to Visit  Medication Sig Dispense Refill   acetaminophen (TYLENOL) 500 MG tablet Take 1,000 mg by mouth every 6 (six) hours as needed for mild pain (or headaches).     amitriptyline (ELAVIL) 10 MG tablet TAKE 2 TABLETS (20 MG TOTAL) BY MOUTH AT BEDTIME. (Patient taking differently: Take 10-20 mg by mouth at bedtime as needed for sleep.) 180 tablet 3   amLODipine (NORVASC) 5 MG tablet Take 1 tablet (5 mg total) by mouth daily. 90 tablet 3   Ascorbic Acid (VITAMIN C PO) Take 1 tablet by mouth every morning.     Calcium Carb-Cholecalciferol (CALCIUM + D3 PO) Take 1 tablet by mouth in the morning.     Cholecalciferol (VITAMIN D-3) 25 MCG (1000  UT) CAPS Take 1,000 Units by mouth daily.     Cyanocobalamin (VITAMIN B-12 PO) Take 1 tablet by mouth every morning.     methocarbamol (ROBAXIN) 500 MG tablet Take 1 tablet (500 mg total) by mouth 2 (two) times daily for 5 days. 10 tablet 0   Multiple Vitamins-Minerals (ZINC PO) Take 1 tablet by mouth 2 (two) times a week.  Polyethyl Glycol-Propyl Glycol (SYSTANE) 0.4-0.3 % SOLN Place 1 drop into both eyes 2 (two) times daily.     rivaroxaban (XARELTO) 20 MG TABS tablet Take 1 tablet (20 mg total) by mouth daily with supper. 90 tablet 3   SUMAtriptan (IMITREX) 100 MG tablet TAKE 1 TABLET BY MOUTH AS NEEDED FOR MIGRAINE, MAY REPEAT IN 2 HOURS IF NEEDED. MAX 2 TABS IN 24 HOURS. (Patient taking differently: Take 100 mg by mouth See admin instructions. Take 100 mg by mouth as needed for migraines- may repeat once in 2 hours, as needed (max of 2 tablets/200 mg in 24 hours)) 9 tablet 9   valACYclovir (VALTREX) 1000 MG tablet Take 1 tablet (1,000 mg total) by mouth 3 (three) times daily. 21 tablet 0   ketoconazole (NIZORAL) 2 % cream Apply 1 application topically daily. (Patient not taking: Reported on 02/02/2021) 15 g 0   triamcinolone cream (KENALOG) 0.1 % Apply 1 application topically 2 (two) times daily. To affected area (insect sting) (Patient not taking: Reported on 02/02/2021) 15 g 0   No current facility-administered medications on file prior to visit.    Review of Systems  Constitutional:  Negative for activity change, appetite change, fatigue, fever and unexpected weight change.  HENT:  Negative for congestion, ear pain, rhinorrhea, sinus pressure and sore throat.   Eyes:  Negative for pain, redness and visual disturbance.  Respiratory:  Negative for cough, shortness of breath and wheezing.   Cardiovascular:  Negative for chest pain and palpitations.  Gastrointestinal:  Negative for abdominal pain, blood in stool, constipation and diarrhea.  Endocrine: Negative for polydipsia and polyuria.   Genitourinary:  Negative for dysuria, frequency and urgency.  Musculoskeletal:  Negative for arthralgias, back pain and myalgias.  Skin:  Positive for rash. Negative for pallor.  Allergic/Immunologic: Negative for environmental allergies.  Neurological:  Negative for dizziness, syncope and headaches.  Hematological:  Negative for adenopathy. Does not bruise/bleed easily.  Psychiatric/Behavioral:  Negative for decreased concentration and dysphoric mood. The patient is not nervous/anxious.       Objective:   Physical Exam Constitutional:      General: She is not in acute distress.    Appearance: Normal appearance. She is normal weight. She is not ill-appearing.  HENT:     Head: Normocephalic and atraumatic.  Eyes:     General:        Right eye: No discharge.        Left eye: No discharge.     Conjunctiva/sclera: Conjunctivae normal.     Pupils: Pupils are equal, round, and reactive to light.  Cardiovascular:     Rate and Rhythm: Normal rate and regular rhythm.     Heart sounds: Normal heart sounds.  Musculoskeletal:     Cervical back: Normal range of motion and neck supple.  Lymphadenopathy:     Cervical: No cervical adenopathy.  Skin:    Findings: Rash present. No bruising.     Comments: Vesicular rash in patches/some areas drying up , T5-T6 dermatomes on the R side (under breast) Tender  No signs of bacterial infection  No weeping  Neurological:     Mental Status: She is alert.     Cranial Nerves: No cranial nerve deficit.     Sensory: No sensory deficit.  Psychiatric:        Mood and Affect: Mood normal.          Assessment & Plan:   Problem List Items Addressed This Visit  Other   Herpes zoster    Right side T5-T6 dermatomes  Taking 7 day course of valcyclovir and tolerating  Reviewed her UC notes in detail Pain is moderate Discussed use of numbing/antiseptic spray prn Tylenol  oxycodone with caution of sedation and constipation  Keep rash clean   She does not tolerate gabapentin  May consider tramadol later Enc her to take 20 mg of amitriptyline at bedtime Update if worse or no improvement in 1-2 wk

## 2021-02-02 NOTE — Telephone Encounter (Signed)
[facility] Night - Client Nonclinical Telephone Record  AccessNurse Client [facility] Night - Client Client Site [facility] Loura Pardon - MD Contact Type Call Who Is Calling Patient / Member / Family / Caregiver Caller Name [place] Phone Number [phone] Patient Name Sarah Young Patient DOB [birth year] Call Type Message Only Information Provided Reason for Call Request to Schedule Office Appointment Initial Comment Caller states she has shingles, and needs an appointment to be seen today. Patient request to speak to RN No Additional Comment Provided office hours. Disp. Time Disposition Final User 02/02/2021 7:40:26 AM General Information Provided Yes Atha Starks Michelene Gardener Call Closed By: Baker Janus Transaction Date/Time: 02/02/2021 7:38:23 AM (ET

## 2021-02-02 NOTE — Telephone Encounter (Signed)
[facility] Night - Client TELEPHONE ADVICE RECORD AccessNurse Patient Name: Sarah Young Gender: Unknown DOB: [birth year] Age: 80 Y 9 M 1 D Return Phone Number: [phone] (Primary), [phone] (Secondary) Address: City/ State/ Zip: [place] [address] Client [facility] Night - Client Client Site [facility], Roque Lias - MD Contact Type Call Who Is Calling Patient / Member / Family / Caregiver Call Type Triage / Clinical Relationship To Patient Self Return Phone Number [phone] (Primary) Chief Complaint Pain - Generalized Reason for Call Symptomatic / Request for [name] states she has shingles, symptoms started yesterday and is having pain on her side and under her breast. Translation No Nurse Assessment Nurse: Glean Salvo, RN, Sarah Young Date/Time (Eastern Time): 01/31/2021 9:56:20 AM Confirm and document reason for call. If symptomatic, describe symptoms. ---Caller states she has shingles, symptoms started yesterday and is having pain redness on side up to her breast. Very painful. Caller states she had them a few years back on her face. Does the patient have any new or worsening symptoms? ---Yes Will a triage be completed? ---Yes Related visit to physician within the last 2 weeks? ---No Does the PT have any chronic conditions? (i.e. diabetes, asthma, this includes High risk factors for pregnancy, etc.) ---No Is this a behavioral health or substance abuse call? ---No Guidelines Guideline Title Affirmed Question Affirmed Notes Nurse Date/Time (Eastern Time) Shingles (Zoster) [1] Shingles rash (matches SYMPTOMS) AND [2] onset < 72 hours ago (3 days) Glean Salvo, Therapist, sports, Sarah Young 01/31/2021 9:58:14 AM Disp. Time Eilene Ghazi Time) Disposition Final User 01/31/2021 10:02:21 AM See PCP within 24 Hours Yes Walker-Foster, RN, Sarah Young PLEASE NOTE:  All timestamps contained within this report are represented as [place] Standard Time. CONFIDENTIALTY NOTICE: This fax transmission is intended only for the addressee. It contains information that is legally privileged, confidential or otherwise protected from use or disclosure. If you are not the intended recipient, you are strictly prohibited from reviewing, disclosing, copying using or disseminating any of this information or taking any action in reliance on or regarding this information. If you have received this fax in error, please notify us immediately by telephone so that we can arrange for its return to [place]. Phone: [phone], Toll-Free: [phone], Fax: [phone] Page: 2 of 2 Call Id: [id number] [place] Disagree/Comply Comply Caller Understands Yes PreDisposition Did not know what to do Care Advice Given Per Guideline SEE PCP WITHIN 24 HOURS: * For pain relief, you can take either acetaminophen, ibuprofen, or naproxen. PAIN MEDICINES: DON'T SCRATCH: * Try not to scratch. REDUCING THE ITCH - CALAMINE LOTION: * LORATADINE (ALAVERT, CLARITIN): The adult dose is 10 mg. You take it once a day. Loratadine is available in the [place] as Engineer, maintenance (IT); it is available in [place] as Claritin. CALL BACK IF: * Fever over 100.4 F (38.0 C) * You become worse CARE ADVICE given per Shingles (Adult) guideline. Referrals REFERRED TO PCP OFFICE GO TO FACILITY UNDECIDE

## 2021-02-02 NOTE — Patient Instructions (Signed)
Please take your amitriptyline every night, this can help pain  Use the oxycodone for severe pain as needed  Caution of sedation and constipation  (stool softener as needed)  Update me when/if you need refill or if you would rather try tramadol   Keep rash clean  Use something soft  The spray is ok if helpful   Stay cool  Use a cool compress as needed   Finish the valcyclovir

## 2021-02-02 NOTE — Telephone Encounter (Signed)
Aware, will see her today as planned

## 2021-02-06 ENCOUNTER — Other Ambulatory Visit: Payer: Self-pay | Admitting: Family Medicine

## 2021-02-06 MED ORDER — TRAMADOL HCL 50 MG PO TABS: 25.0000 mg | ORAL_TABLET | Freq: Three times a day (TID) | ORAL | 0 refills | Status: AC | PRN

## 2021-02-06 NOTE — Telephone Encounter (Signed)
Pt notified of Dr. Marliss Coots comments. Pt said the Shingles are still very bad she still has the rash and it's very painful especially under breast and on her side. Pt wanted to know if there is anything else PCP can recommend. Pt said they gave her Oxycontin but it's so strong she only can take it at night so she's been using tylenol during the day but it's not helping at all. Pt ?? If there is a cream or anything that will help she has been using a spray she showed PCP at her recent visit but still not helping much. She tried to use Vaseline but it burned when she put it on so she stopped using it.

## 2021-02-06 NOTE — Telephone Encounter (Signed)
Requested Prescriptions   Signed Prescriptions Disp Refills   traMADol (ULTRAM) 50 MG tablet 15 tablet 0    Sig: Take 0.5-1 tablets (25-50 mg total) by mouth every 8 (eight) hours as needed for up to 5 days for severe pain. Caution of sedation    Authorizing Provider: Pleas Koch   Refill(s) sent to pharmacy.

## 2021-02-06 NOTE — Telephone Encounter (Signed)
1.Medication Requested: valACYclovir (VALTREX) 1000 MG tablet  2. Pharmacy (Name, [address], [place]): [facility] [place] 3. On Med List: yes  4. Last Visit with PCP: 11.7.22   5. Next visit date with PCP: n/a   Agent: Please be advised that RX refills may take up to 3 business days. We ask that you follow-up with your pharmacy.   Patient states she has shingles and she only  has 2 pills left from the ones from [facility]

## 2021-02-06 NOTE — Telephone Encounter (Signed)
Med was given for shingles on 01/31/21, ? If she needs med refill, please advise

## 2021-02-06 NOTE — Telephone Encounter (Signed)
Once you finish the 7 day course you are done.   (No benefit from taking any longer)  How is she doing?

## 2021-02-06 NOTE — Addendum Note (Signed)
Addended by: Loura Pardon A on: 02/06/2021 05:02 PM   Modules accepted: Orders

## 2021-02-06 NOTE — Telephone Encounter (Signed)
Pt notified of Dr. Glori Bickers comments she does want to try Tramadol. [facility]

## 2021-02-06 NOTE — Telephone Encounter (Signed)
I would like to try some tramadol if needed   My e px is down right now If agreeable send back to me and I will see when I can send it  Do watch out for sedation and constipation with it  Also fall precaution

## 2021-02-06 NOTE — Telephone Encounter (Signed)
Routing to [place] to help since my prescribing is down  Thanks Anda Kraft!

## 2021-02-06 NOTE — Addendum Note (Signed)
Addended by: Pleas Koch on: 02/06/2021 05:39 PM   Modules accepted: Orders

## 2021-03-05 ENCOUNTER — Ambulatory Visit: Payer: Medicare Other | Admitting: Nurse Practitioner

## 2021-03-17 ENCOUNTER — Other Ambulatory Visit: Payer: Self-pay | Admitting: Internal Medicine

## 2021-03-17 MED ORDER — MOLNUPIRAVIR 200 MG PO CAPS: 4.0000 | ORAL_CAPSULE | Freq: Two times a day (BID) | ORAL | 0 refills | Status: DC

## 2021-03-17 NOTE — Progress Notes (Signed)
Virtual visit with husband for COVID infection She tested positive also and started with symptoms yesterday Will go ahead and send Rx for molnupiravir as well  She has no respiratory distress and looks okay

## 2021-03-27 ENCOUNTER — Other Ambulatory Visit: Payer: Self-pay | Admitting: Family Medicine

## 2021-03-27 NOTE — Telephone Encounter (Signed)
Xarelto last filled on 03/26/20 #90 tabs with 3 refills, last OV was 02/02/21

## 2021-06-19 ENCOUNTER — Telehealth (INDEPENDENT_AMBULATORY_CARE_PROVIDER_SITE_OTHER): Payer: Medicare Other | Admitting: Family Medicine

## 2021-06-19 ENCOUNTER — Other Ambulatory Visit: Payer: Self-pay

## 2021-06-19 ENCOUNTER — Encounter: Payer: Self-pay | Admitting: Family Medicine

## 2021-06-19 DIAGNOSIS — J209 Acute bronchitis, unspecified: Secondary | ICD-10-CM | POA: Diagnosis not present

## 2021-06-19 MED ORDER — BENZONATATE 200 MG PO CAPS: 200.0000 mg | ORAL_CAPSULE | Freq: Three times a day (TID) | ORAL | 1 refills | Status: DC | PRN

## 2021-06-19 MED ORDER — PREDNISONE 10 MG PO TABS: ORAL_TABLET | ORAL | 0 refills | Status: DC

## 2021-06-19 MED ORDER — AZITHROMYCIN 250 MG PO TABS: ORAL_TABLET | ORAL | 0 refills | Status: DC

## 2021-06-19 NOTE — Patient Instructions (Signed)
Drink fluids and rest  ?mucinex DM is good for cough and congestion  ?Nasal saline for congestion as needed  ?Tylenol for fever or pain or headache  ?Please alert [place] if symptoms worsen (if severe or short of breath please go to the ER)  ? ?Take the tessalon for cough  ?Zpack for sinus infection  ?Prednisone for bronchitis, it may make you hyper and hungry  ? ?Update if not starting to improve in a week or if worsening  ? ?

## 2021-06-19 NOTE — Assessment & Plan Note (Signed)
Acute bronchitis with early bacterial sinusitis ?Likely started as a virus ?Now some mild wheezing and tight chest ?Plan to treat with 40 mg prednisone taper Zithromax pack and Tessalon Perles ?Instructed to continue Mucinex DM if it is helping and drink lots of fluids and rest ?ER precautions discussed ?Update if not starting to improve in a week or if worsening   ?

## 2021-06-19 NOTE — Progress Notes (Signed)
Virtual Visit via Video Note ? ?I connected with Sarah Young on 06/19/21 at  4:00 PM EDT by a video enabled telemedicine application and verified that I am speaking with the correct person using two identifiers. ? ?Location: ?Patient: home  ? ?Provider: office  ?  ?I discussed the limitations of evaluation and management by telemedicine and the availability of in person appointments. The patient expressed understanding and agreed to proceed. ? ?This visit occurred during the SARS-CoV-2 public health emergency.  Safety protocols were in place, including screening questions prior to the visit, additional usage of staff PPE, and extensive cleaning of exam room while observing appropriate contact time as indicated for disinfecting solutions.  ? ? ?History of Present Illness: ?Pt presents for congestion and cough  ?Started Sunday  ? ?Congestion = lots of nose blowing  ?Bloody and green  ?Facial pain under eyes  ?Fatigue -exhausted  ? ?Cough : phlegm is is green in color  ?A little chest tightness  ?A little wheezing  ? ?Low grade temp early this week  ?No chills or sweats ?Is achey -no more than usual  ? ?Throat is a little sore  ?Ears are ok  ? ?Otc ?Mucinex DM ?Cough medicine otc  ? ?Has done covid test- negative  ? ?  ?Patient Active Problem List  ? Diagnosis Date Noted  ? Acute bronchitis 06/19/2021  ? Herpes zoster 02/02/2021  ? Medicare annual wellness visit, subsequent 08/14/2020  ? Aortic atherosclerosis ([place]) 08/23/2019  ? Dizziness 07/11/2019  ? Deep vein thrombosis (DVT) of left lower extremity ([place]) 05/25/2019  ? Hypercoagulopathy ([place]) 05/22/2019  ? History of pulmonary embolism 05/07/2019  ? Chest pain of unknown etiology -low risk for Cardiac Etiology 04/06/2019  ? SOB (shortness of breath) on exertion 04/06/2019  ? Osteopenia 05/02/2018  ? Routine general medical examination at a health care facility 11/21/2016  ? Estrogen deficiency 11/19/2016  ? Renal insufficiency 11/19/2016  ? Pedal edema  12/15/2015  ? Family history of breast cancer in female 05/17/2014  ? Malignant neoplasm of upper-outer quadrant of right breast in female, estrogen receptor positive ([place]) 05/08/2014  ? GERD (gastroesophageal reflux disease) 03/01/2014  ? Lumbar degenerative disc disease 08/15/2013  ? Chronic pain of scapula 08/15/2013  ? Essential hypertension 10/02/2008  ? Migraine 09/06/2008  ? Vitamin D deficiency 06/27/2007  ? HYPERLIPIDEMIA 05/11/2007  ? Generalized anxiety disorder 05/11/2007  ? Disorder of bone and cartilage 05/11/2007  ? H/O poliomyelitis 05/08/2007  ? OSTEOARTHRITIS, HANDS, BILATERAL 05/08/2007  ? Fibromyalgia 05/08/2007  ? URINARY INCONTINENCE, STRESS, MILD 05/08/2007  ? ?Past Medical History:  ?Diagnosis Date  ? Anxiety   ? Arthritis   ? OA of hands  ? Breast cancer of upper-outer quadrant of right female breast ([place]) 05/08/2014  ? DVT (deep venous thrombosis) ([place]) 1980s  ? Fibromyalgia   ? Hyperlipidemia   ? Hypertension   ? Irritable bowel syndrome   ? Migraine   ? Osteopenia   ? Personal history of radiation therapy   ? Pulmonary embolus ([place]) 2021  ? S/P radiation therapy 06/2014  ? Right breast 4250 cGy in 17 sessions, right breast boost 1200 cGy in 6 sessions  = 07/11/2014 through 08/12/2014                        ? Vitamin D deficiency   ? Wears glasses   ? ?Past Surgical History:  ?Procedure Laterality Date  ? ABDOMINAL HYSTERECTOMY  1972  ?  APPENDECTOMY    ? BREAST EXCISIONAL BIOPSY Left   ? BREAST LUMPECTOMY Right 2016  ? BREAST SURGERY  1995  ? lumpectomy fibrocystic breast-lt  ? CATARACT EXTRACTION    ? both  ? CHOLECYSTECTOMY  2005  ? COLONOSCOPY    ? ovarian cyst removed  1985  ? RADIOACTIVE SEED GUIDED PARTIAL MASTECTOMY WITH AXILLARY SENTINEL LYMPH NODE BIOPSY Right 05/30/2014  ? Procedure: RADIOACTIVE SEED GUIDED PARTIAL MASTECTOMY WITH AXILLARY SENTINEL LYMPH NODE BIOPSY;  Surgeon: Autumn Messing III, MD;  Location: [facility];  Service: General;  Laterality: Right;  ? ?Social  History  ? ?Tobacco Use  ? Smoking status: Never  ? Smokeless tobacco: Never  ? Tobacco comments:  ?  non smoker  ?Vaping Use  ? Vaping Use: Never used  ?Substance Use Topics  ? Alcohol use: No  ?  Alcohol/week: 0.0 standard drinks  ? Drug use: No  ? ?Family History  ?Problem Relation Age of Onset  ? Hypertension Father   ? Cancer Father 51  ?     esophageal - smoker  ? Leukemia Mother   ? Cancer Mother 43  ?     leukemia  ? Cancer Brother 79  ?     breast CA  ? Breast cancer Brother 75  ? Cancer Maternal Aunt 80  ?     leukemia  ? Cancer Paternal Aunt 60  ?     breast  ? Breast cancer Paternal Aunt 3  ? Breast cancer Maternal Grandmother 2  ? Breast cancer Sister 66  ? ?Allergies  ?Allergen Reactions  ? Amoxicillin Itching and Other (See Comments)  ?   Paradoxical Reaction- made infection worse ?  ? Amoxicillin-Pot Clavulanate Other (See Comments)  ?  Felt like body turned "inside out"  ? Esomeprazole Magnesium Nausea And Vomiting  ? Gabapentin Other (See Comments)  ?  Caused nightmares  ? Omeprazole Nausea And Vomiting  ? Other Itching, Swelling and Other (See Comments)  ?  Ant venom = Made the site (where bitten) swell  ? Ranitidine Hcl Other (See Comments)  ?  "Not effective"  ? Wasp Venom Swelling and Other (See Comments)  ?  Made leg swell where stung  ? ?Current Outpatient Medications on File Prior to Visit  ?Medication Sig Dispense Refill  ? acetaminophen (TYLENOL) 500 MG tablet Take 1,000 mg by mouth every 6 (six) hours as needed for mild pain (or headaches).    ? amitriptyline (ELAVIL) 10 MG tablet TAKE 2 TABLETS (20 MG TOTAL) BY MOUTH AT BEDTIME. (Patient taking differently: Take 10-20 mg by mouth at bedtime as needed for sleep.) 180 tablet 3  ? amLODipine (NORVASC) 5 MG tablet Take 1 tablet (5 mg total) by mouth daily. 90 tablet 3  ? Ascorbic Acid (VITAMIN C PO) Take 1 tablet by mouth every morning.    ? Calcium Carb-Cholecalciferol (CALCIUM + D3 PO) Take 1 tablet by mouth in the morning.    ?  Cholecalciferol (VITAMIN D-3) 25 MCG (1000 UT) CAPS Take 1,000 Units by mouth daily.    ? Cyanocobalamin (VITAMIN B-12 PO) Take 1 tablet by mouth every morning.    ? ketoconazole (NIZORAL) 2 % cream Apply 1 application topically daily. 15 g 0  ? Multiple Vitamins-Minerals (ZINC PO) Take 1 tablet by mouth 2 (two) times a week.    ? Polyethyl Glycol-Propyl Glycol (SYSTANE) 0.4-0.3 % SOLN Place 1 drop into both eyes 2 (two) times daily.    ?  SUMAtriptan (IMITREX) 100 MG tablet TAKE 1 TABLET BY MOUTH AS NEEDED FOR MIGRAINE, MAY REPEAT IN 2 HOURS IF NEEDED. MAX 2 TABS IN 24 HOURS. (Patient taking differently: Take 100 mg by mouth See admin instructions. Take 100 mg by mouth as needed for migraines- may repeat once in 2 hours, as needed (max of 2 tablets/200 mg in 24 hours)) 9 tablet 9  ? triamcinolone cream (KENALOG) 0.1 % Apply 1 application topically 2 (two) times daily. To affected area (insect sting) 15 g 0  ? valACYclovir (VALTREX) 1000 MG tablet Take 1 tablet (1,000 mg total) by mouth 3 (three) times daily. 21 tablet 0  ? XARELTO 20 MG TABS tablet TAKE 1 TABLET BY MOUTH DAILY WITH SUPPER. 90 tablet 3  ? ?No current facility-administered medications on file prior to visit.  ? Review of Systems  ?Constitutional:  Positive for malaise/fatigue. Negative for chills and fever.  ?HENT:  Positive for congestion, sinus pain and sore throat. Negative for ear pain.   ?Eyes:  Negative for blurred vision, discharge and redness.  ?Respiratory:  Positive for cough, sputum production and wheezing. Negative for shortness of breath and stridor.   ?Cardiovascular:  Negative for chest pain, palpitations and leg swelling.  ?Gastrointestinal:  Negative for abdominal pain, diarrhea, nausea and vomiting.  ?Musculoskeletal:  Negative for myalgias.  ?Skin:  Negative for rash.  ?Neurological:  Positive for headaches. Negative for dizziness.   ?Observations/Objective: ?Patient appears well, in no distress ?Weight is baseline  ?No facial  swelling or asymmetry ?Hoarse voice, occ clears throat ?Tender maxillary sinus area -self palp ?No obvious tremor or mobility impairment ?Moving neck and UEs normally ?Able to hear the call well  ?No wheeze or shortn

## 2021-08-04 IMAGING — CT CT ANGIO CHEST
2 of 8 series · 18 of 46 positions shown · IV contrast (OMNIPAQUE 350)
Comparison: None.

CLINICAL DATA: Shortness of breath, orthopnea.

EXAM:
CT ANGIOGRAPHY CHEST WITH CONTRAST
TECHNIQUE: Multidetector CT imaging of the chest was performed using the
standard protocol during bolus administration of intravenous
contrast. Multiplanar CT image reconstructions and MIPs were
obtained to evaluate the vascular anatomy.
CONTRAST:  64mL OMNIPAQUE IOHEXOL 350 MG/ML SOLN

[Series 5: thins · axial · 0.59mm/px · z∈[-314,-61]mm · 15 of 285 slices shown]
[im 16/285  lung]
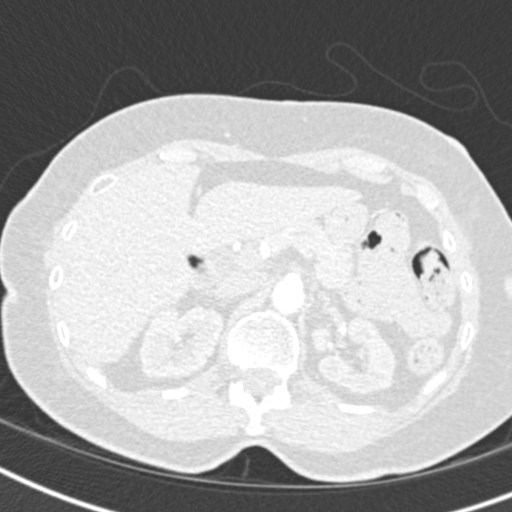
[im 32/285  soft-tissue]
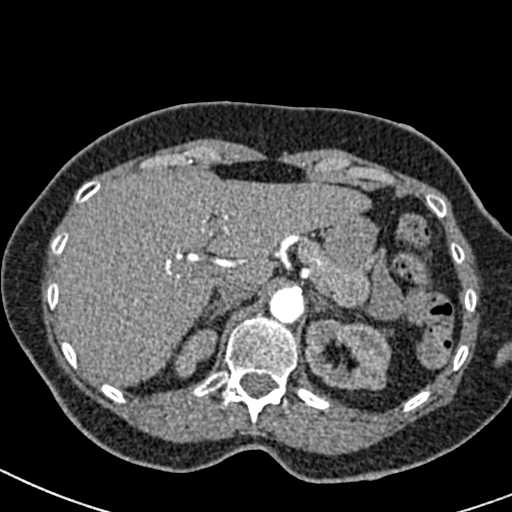
[im 48/285  lung]
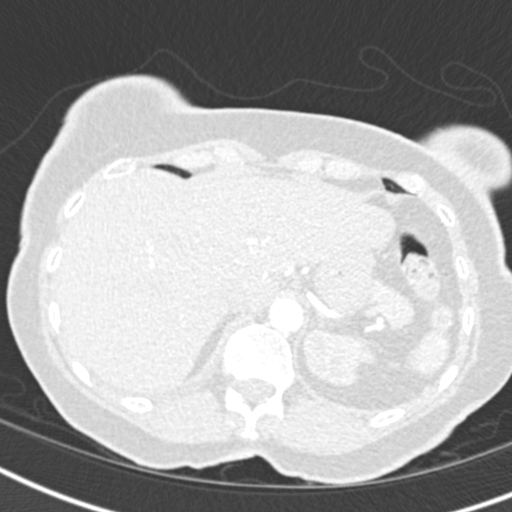
[im 64/285  soft-tissue]
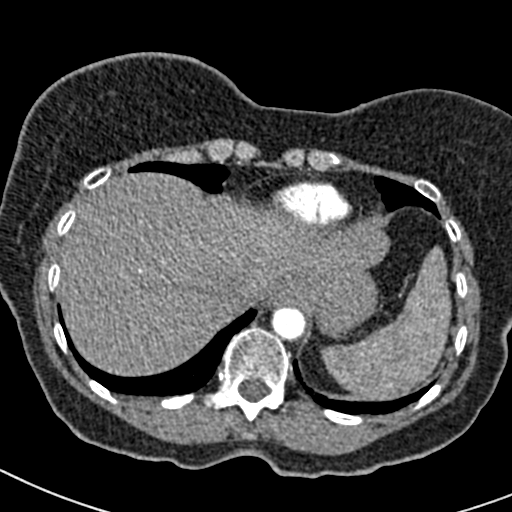
[im 95/285  lung]
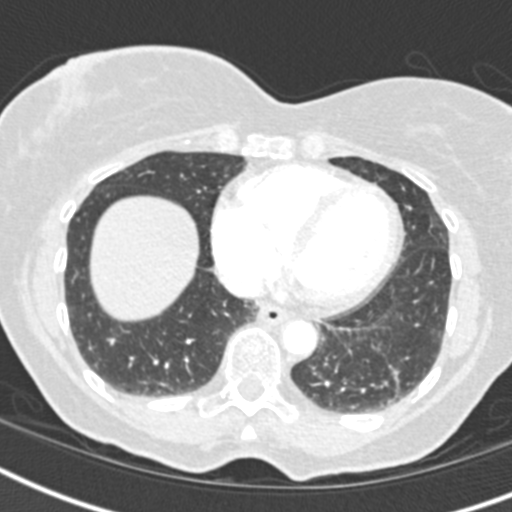
[im 111/285  soft-tissue]
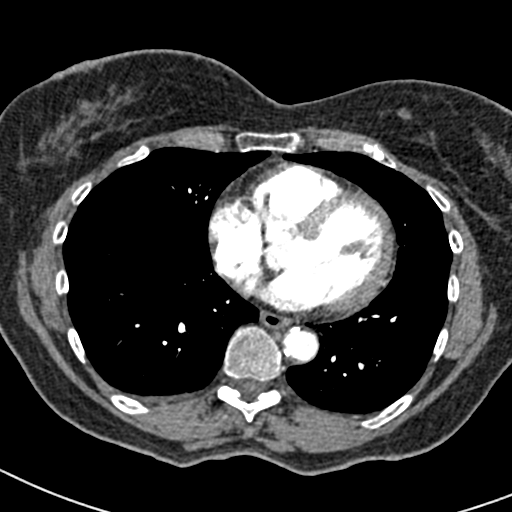
[im 127/285  lung]
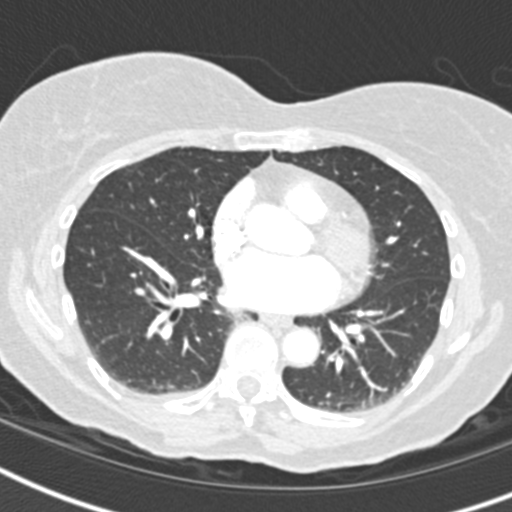
[im 143/285  soft-tissue]
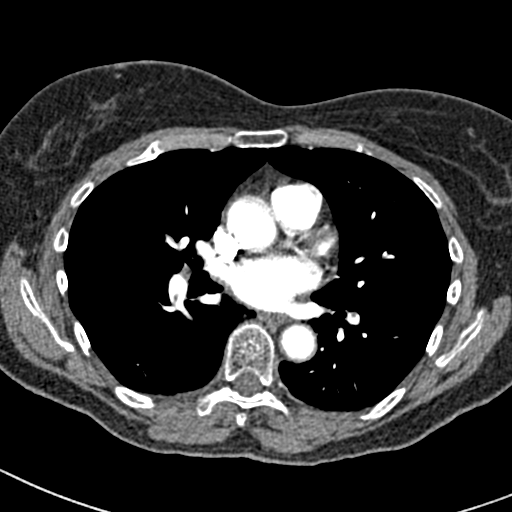
[im 158/285  lung]
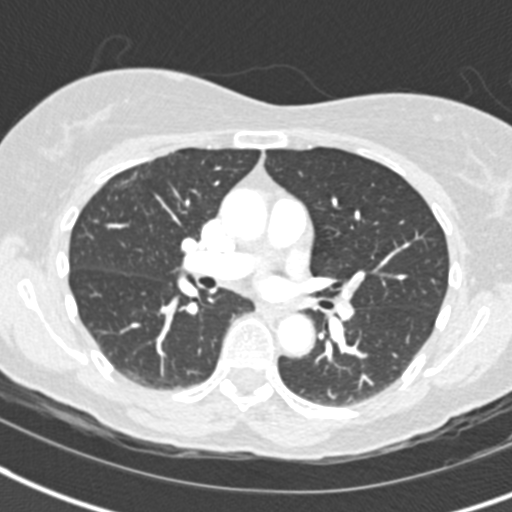
[im 174/285  soft-tissue]
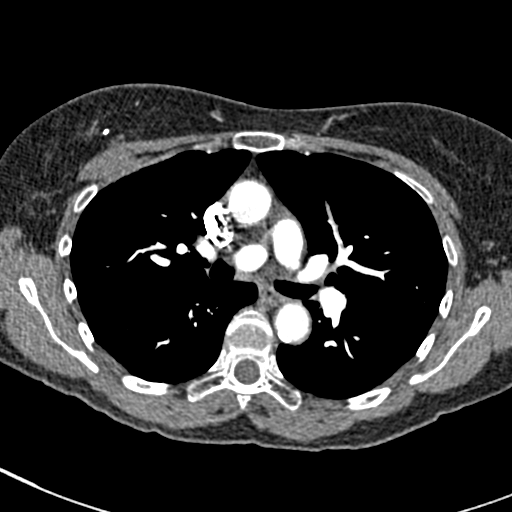
[im 190/285  lung]
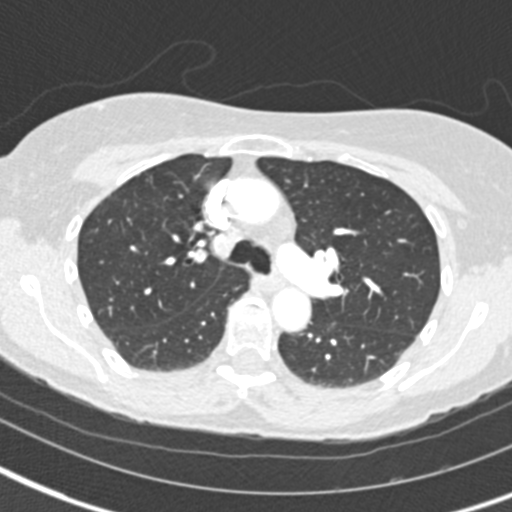
[im 221/285  soft-tissue]
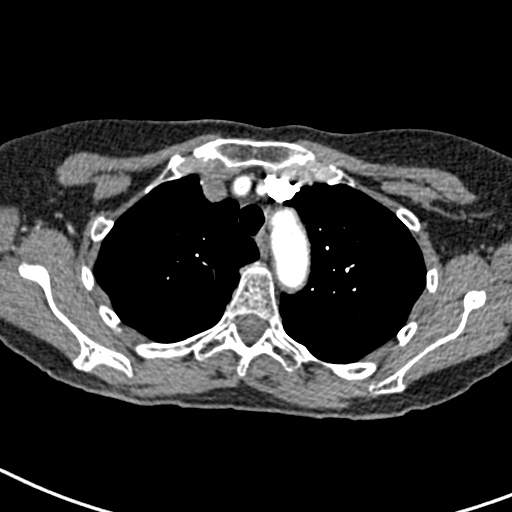
[im 237/285  lung]
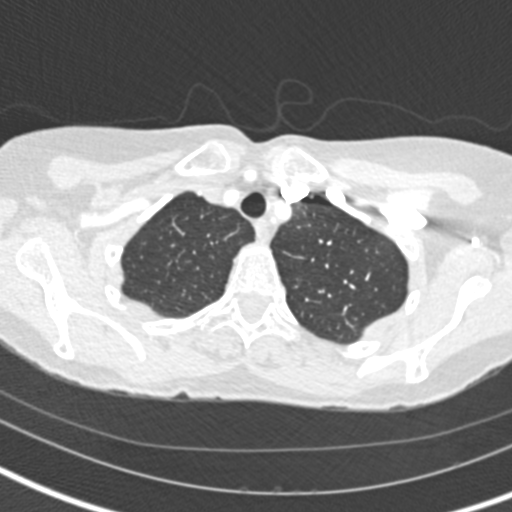
[im 253/285  soft-tissue]
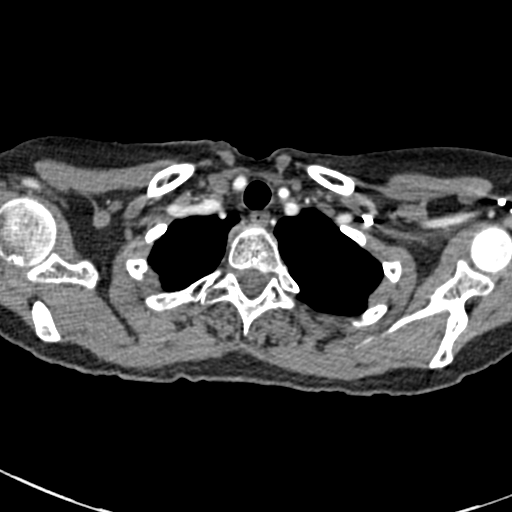
[im 269/285  lung]
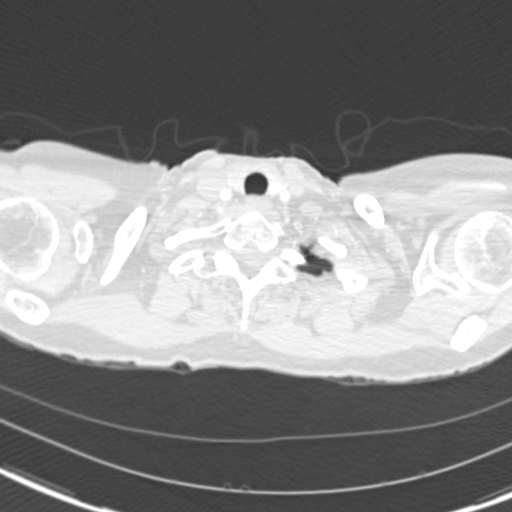

[Series 7: coronal mpr · coronal · 0.53mm/px · 3 of 99 slices shown]
[im 25/99  soft-tissue]
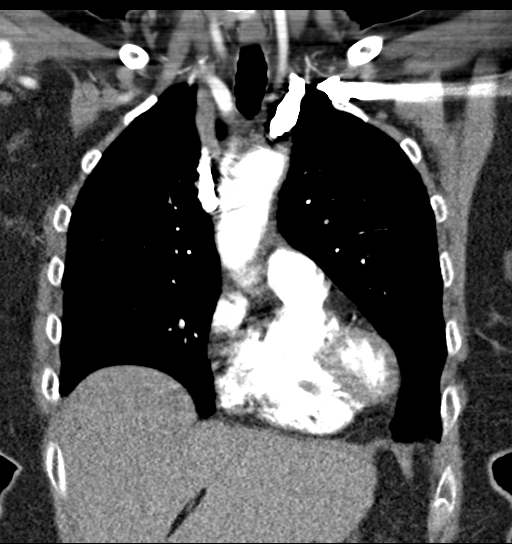
[im 50/99  soft-tissue]
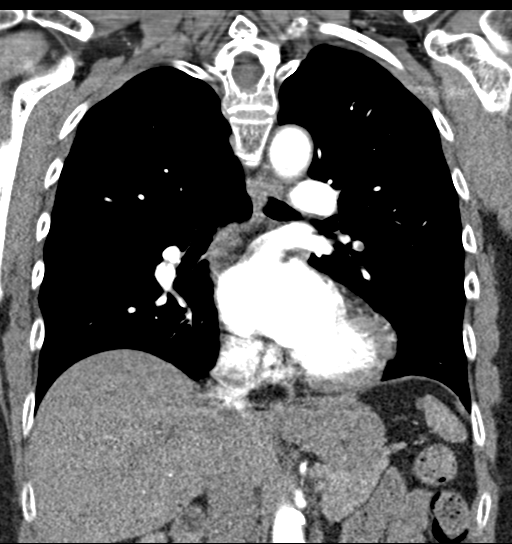
[im 74/99  soft-tissue]
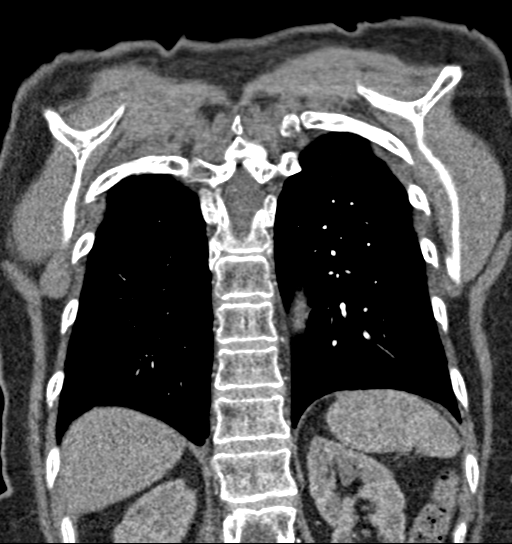

[18 of 46 positions shown; findings below may reference images not displayed]

FINDINGS: Cardiovascular: There are filling defects in the pulmonary arteries
bilaterally, which appear mostly peripheral and
segmental/subsegmental in location. Heart size normal. No
pericardial effusion.

Mediastinum/Nodes: No pathologically enlarged mediastinal, hilar or
axillary lymph nodes. Esophagus is grossly unremarkable.

Lungs/Pleura: Probable subpleural radiation fibrosis in the anterior
right lung in this patient with a history of right breast
lumpectomy. Lungs are otherwise clear. Trace right pleural fluid.
Airway is unremarkable.

Upper Abdomen: Low-attenuation lesion in the dome of the liver
measures 1.7 cm and is likely a cyst. Visualized portions of the
liver, adrenal glands and right kidney are otherwise unremarkable.
1.4 cm low-density lesion off the upper pole left kidney is likely a
cyst. Visualized portions of the spleen, pancreas, stomach and bowel
are otherwise grossly unremarkable. No upper abdominal adenopathy.

Musculoskeletal: No worrisome lytic or sclerotic lesions.

Review of the MIP images confirms the above findings.
IMPRESSION: 1. Positive for subacute bilateral pulmonary emboli, mainly
peripheral and segmental/subsegmental in location. No evidence right
heart strain. Critical Value/emergent results were called by
provider VESLEE SAITI , who verbally acknowledged these results.
2. Trace right pleural fluid.

## 2021-08-18 ENCOUNTER — Ambulatory Visit (INDEPENDENT_AMBULATORY_CARE_PROVIDER_SITE_OTHER): Payer: Medicare Other | Admitting: *Deleted

## 2021-08-18 DIAGNOSIS — Z0000 Encounter for general adult medical examination without abnormal findings: Secondary | ICD-10-CM | POA: Diagnosis not present

## 2021-08-18 NOTE — Patient Instructions (Signed)
[name] , Thank you for taking time to come for your Medicare Wellness Visit. I appreciate your ongoing commitment to your health goals. Please review the following plan we discussed and let me know if I can assist you in the future.   Screening recommendations/referrals: Colonoscopy: no longer required Mammogram: up to date Bone Density: up to date Recommended yearly ophthalmology/optometry visit for glaucoma screening and checkup Recommended yearly dental visit for hygiene and checkup  Vaccinations: Influenza vaccine: up to date Pneumococcal vaccine: up to date Tdap vaccine: Education provided Shingles vaccine: Education provided        Preventive Care 16 Years and Older, Female Preventive care refers to lifestyle choices and visits with your health care provider that can promote health and wellness. What does preventive care include? A yearly physical exam. This is also called an annual well check. Dental exams once or twice a year. Routine eye exams. Ask your health care provider how often you should have your eyes checked. Personal lifestyle choices, including: Daily care of your teeth and gums. Regular physical activity. Eating a healthy diet. Avoiding tobacco and drug use. Limiting alcohol use. Practicing safe sex. Taking low-dose aspirin every day. Taking vitamin and mineral supplements as recommended by your health care provider. What happens during an annual well check? The services and screenings done by your health care provider during your annual well check will depend on your age, overall health, lifestyle risk factors, and family history of disease. Counseling  Your health care provider may ask you questions about your: Alcohol use. Tobacco use. Drug use. Emotional well-being. Home and relationship well-being. Sexual activity. Eating habits. History of falls. Memory and ability to understand (cognition). Work and work Statistician. Reproductive  health. Screening  You may have the following tests or measurements: Height, weight, and BMI. Blood pressure. Lipid and cholesterol levels. These may be checked every 5 years, or more frequently if you are over 23 years old. Skin check. Lung cancer screening. You may have this screening every year starting at age 19 if you have a 30-pack-year history of smoking and currently smoke or have quit within the past 15 years. Fecal occult blood test (FOBT) of the stool. You may have this test every year starting at age 59. Flexible sigmoidoscopy or colonoscopy. You may have a sigmoidoscopy every 5 years or a colonoscopy every 10 years starting at age 56. Hepatitis C blood test. Hepatitis B blood test. Sexually transmitted disease (STD) testing. Diabetes screening. This is done by checking your blood sugar (glucose) after you have not eaten for a while (fasting). You may have this done every 1-3 years. Bone density scan. This is done to screen for osteoporosis. You may have this done starting at age 40. Mammogram. This may be done every 1-2 years. Talk to your health care provider about how often you should have regular mammograms. Talk with your health care provider about your test results, treatment options, and if necessary, the need for more tests. Vaccines  Your health care provider may recommend certain vaccines, such as: Influenza vaccine. This is recommended every year. Tetanus, diphtheria, and acellular pertussis (Tdap, Td) vaccine. You may need a Td booster every 10 years. Zoster vaccine. You may need this after age 28. Pneumococcal 13-valent conjugate (PCV13) vaccine. One dose is recommended after age 36. Pneumococcal polysaccharide (PPSV23) vaccine. One dose is recommended after age 52. Talk to your health care provider about which screenings and vaccines you need and how often you need them. This  information is not intended to replace advice given to you by your health care provider.  Make sure you discuss any questions you have with your health care provider. Document Released: 04/11/2015 Document Revised: 12/03/2015 Document Reviewed: 01/14/2015 Elsevier Interactive Patient Education  2017 [place] Prevention in the Home Falls can cause injuries. They can happen to people of all ages. There are many things you can do to make your home safe and to help prevent falls. What can I do on the outside of my home? Regularly fix the edges of walkways and driveways and fix any cracks. Remove anything that might make you trip as you walk through a door, such as a raised step or threshold. Trim any bushes or trees on the path to your home. Use bright outdoor lighting. Clear any walking paths of anything that might make someone trip, such as rocks or tools. Regularly check to see if handrails are loose or broken. Make sure that both sides of any steps have handrails. Any raised decks and porches should have guardrails on the edges. Have any leaves, snow, or ice cleared regularly. Use sand or salt on walking paths during winter. Clean up any spills in your garage right away. This includes oil or grease spills. What can I do in the bathroom? Use night lights. Install grab bars by the toilet and in the tub and shower. Do not use towel bars as grab bars. Use non-skid mats or decals in the tub or shower. If you need to sit down in the shower, use a plastic, non-slip stool. Keep the floor dry. Clean up any water that spills on the floor as soon as it happens. Remove soap buildup in the tub or shower regularly. Attach bath mats securely with double-sided non-slip rug tape. Do not have throw rugs and other things on the floor that can make you trip. What can I do in the bedroom? Use night lights. Make sure that you have a light by your bed that is easy to reach. Do not use any sheets or blankets that are too big for your bed. They should not hang down onto the floor. Have a  firm chair that has side arms. You can use this for support while you get dressed. Do not have throw rugs and other things on the floor that can make you trip. What can I do in the kitchen? Clean up any spills right away. Avoid walking on wet floors. Keep items that you use a lot in easy-to-reach places. If you need to reach something above you, use a strong step stool that has a grab bar. Keep electrical cords out of the way. Do not use floor polish or wax that makes floors slippery. If you must use wax, use non-skid floor wax. Do not have throw rugs and other things on the floor that can make you trip. What can I do with my stairs? Do not leave any items on the stairs. Make sure that there are handrails on both sides of the stairs and use them. Fix handrails that are broken or loose. Make sure that handrails are as long as the stairways. Check any carpeting to make sure that it is firmly attached to the stairs. Fix any carpet that is loose or worn. Avoid having throw rugs at the top or bottom of the stairs. If you do have throw rugs, attach them to the floor with carpet tape. Make sure that you have a light switch at the top  of the stairs and the bottom of the stairs. If you do not have them, ask someone to add them for you. What else can I do to help prevent falls? Wear shoes that: Do not have high heels. Have rubber bottoms. Are comfortable and fit you well. Are closed at the toe. Do not wear sandals. If you use a stepladder: Make sure that it is fully opened. Do not climb a closed stepladder. Make sure that both sides of the stepladder are locked into place. Ask someone to hold it for you, if possible. Clearly mark and make sure that you can see: Any grab bars or handrails. First and last steps. Where the edge of each step is. Use tools that help you move around (mobility aids) if they are needed. These include: Canes. Walkers. Scooters. Crutches. Turn on the lights when you  go into a dark area. Replace any light bulbs as soon as they burn out. Set up your furniture so you have a clear path. Avoid moving your furniture around. If any of your floors are uneven, fix them. If there are any pets around you, be aware of where they are. Review your medicines with your doctor. Some medicines can make you feel dizzy. This can increase your chance of falling. Ask your doctor what other things that you can do to help prevent falls. This information is not intended to replace advice given to you by your health care provider. Make sure you discuss any questions you have with your health care provider. Document Released: 01/09/2009 Document Revised: 08/21/2015 Document Reviewed: 04/19/2014 Elsevier Interactive Patient Education  2017 [employer].

## 2021-08-18 NOTE — Progress Notes (Signed)
Subjective:   Sarah Young is a 81 y.o. female who presents for Medicare Annual (Subsequent) preventive examination.  I connected with  Sarah Young on 08/18/21 by a telephone enabled telemedicine application and verified that I am speaking with the correct person using two identifiers.   I discussed the limitations of evaluation and management by telemedicine. The patient expressed understanding and agreed to proceed.  Patient location: home  Provider location: tele-health-home    Review of Systems     Cardiac Risk Factors include: advanced age (>9mn, >>33women);hypertension     Objective:    Today's Vitals   There is no height or weight on file to calculate BMI.     08/18/2021    8:34 AM 01/28/2021    8:53 AM 06/09/2020   10:15 AM 06/07/2020   10:44 AM 07/27/2019   10:33 AM 01/04/2017    3:14 PM 11/19/2016    9:40 AM  Advanced Directives  Does Patient Have a Medical Advance Directive? Yes No No Yes Yes Yes Yes  Type of AFreight forwarderwill [place]Living will [place]ittLiving will [place] RockLiving will  Does patient want to make changes to medical advance directive?       No - Patient declined  Copy of [place] Chart? No - copy requested    No - copy requested No - copy requested No - copy requested  Would patient like information on creating a medical advance directive?  No - Patient declined    No - Patient declined     Current Medications (verified) Outpatient Encounter Medications as of 08/18/2021  Medication Sig   acetaminophen (TYLENOL) 500 MG tablet Take 1,000 mg by mouth every 6 (six) hours as needed for mild pain (or headaches).   amitriptyline (ELAVIL) 10 MG tablet TAKE 2 TABLETS (20 MG TOTAL) BY MOUTH AT BEDTIME. (Patient taking differently: Take 10-20 mg by mouth at bedtime as needed for sleep.)   amLODipine (NORVASC) 5  MG tablet Take 1 tablet (5 mg total) by mouth daily.   Ascorbic Acid (VITAMIN C PO) Take 1 tablet by mouth every morning.   Calcium Carb-Cholecalciferol (CALCIUM + D3 PO) Take 1 tablet by mouth in the morning.   Cholecalciferol (VITAMIN D-3) 25 MCG (1000 UT) CAPS Take 1,000 Units by mouth daily.   Cyanocobalamin (VITAMIN B-12 PO) Take 1 tablet by mouth every morning.   Multiple Vitamins-Minerals (ZINC PO) Take 1 tablet by mouth 2 (two) times a week.   Polyethyl Glycol-Propyl Glycol (SYSTANE) 0.4-0.3 % SOLN Place 1 drop into both eyes 2 (two) times daily.   SUMAtriptan (IMITREX) 100 MG tablet TAKE 1 TABLET BY MOUTH AS NEEDED FOR MIGRAINE, MAY REPEAT IN 2 HOURS IF NEEDED. MAX 2 TABS IN 24 HOURS. (Patient taking differently: Take 100 mg by mouth See admin instructions. Take 100 mg by mouth as needed for migraines- may repeat once in 2 hours, as needed (max of 2 tablets/200 mg in 24 hours))   valACYclovir (VALTREX) 1000 MG tablet Take 1 tablet (1,000 mg total) by mouth 3 (three) times daily.   XARELTO 20 MG TABS tablet TAKE 1 TABLET BY MOUTH DAILY WITH SUPPER.   azithromycin (ZITHROMAX Z-PAK) 250 MG tablet Take 2 pills by mouth today and then 1 pill daily for 4 days   benzonatate (TESSALON) 200 MG capsule Take 1 capsule (200 mg total) by mouth 3 (three) times  daily as needed for cough. Swallow whole (Patient not taking: Reported on 08/18/2021)   ketoconazole (NIZORAL) 2 % cream Apply 1 application topically daily. (Patient not taking: Reported on 08/18/2021)   predniSONE (DELTASONE) 10 MG tablet Take 4 pills once daily by mouth for 3 days, then 3 pills daily for 3 days, then 2 pills daily for 3 days then 1 pill daily for 3 days then stop   triamcinolone cream (KENALOG) 0.1 % Apply 1 application topically 2 (two) times daily. To affected area (insect sting)   No facility-administered encounter medications on file as of 08/18/2021.    Allergies (verified) Amoxicillin, Amoxicillin-pot clavulanate,  Esomeprazole magnesium, Gabapentin, Omeprazole, Other, Ranitidine hcl, and Wasp venom   History: Past Medical History:  Diagnosis Date   Anxiety    Arthritis    OA of hands   Breast cancer of upper-outer quadrant of right female breast ([facility]) 05/08/2014   DVT (deep venous thrombosis) ([facility]) 1980s   Fibromyalgia    Hyperlipidemia    Hypertension    Irritable bowel syndrome    Migraine    Osteopenia    Personal history of radiation therapy    Pulmonary embolus ([place]) 2021   S/P radiation therapy 06/2014   Right breast 4250 cGy in 17 sessions, right breast boost 1200 cGy in 6 sessions  = 07/11/2014 through 08/12/2014                         Vitamin D deficiency    Wears glasses    Past Surgical History:  Procedure Laterality Date   ABDOMINAL HYSTERECTOMY  1972   APPENDECTOMY     BREAST EXCISIONAL BIOPSY Left    BREAST LUMPECTOMY Right 2016   BREAST SURGERY  1995   lumpectomy fibrocystic breast-lt   CATARACT EXTRACTION     both   CHOLECYSTECTOMY  2005   COLONOSCOPY     ovarian cyst removed  [place] PARTIAL MASTECTOMY WITH AXILLARY SENTINEL LYMPH NODE BIOPSY Right 05/30/2014   Procedure: RADIOACTIVE SEED GUIDED PARTIAL MASTECTOMY WITH AXILLARY SENTINEL LYMPH NODE BIOPSY;  Surgeon: Autumn Messing III, MD;  Location: [facility];  Service: General;  Laterality: Right;   Family History  Problem Relation Age of Onset   Hypertension Father    Cancer Father 74       esophageal - smoker   Leukemia Mother    Cancer Mother 21       leukemia   Cancer Brother 74       breast CA   Breast cancer Brother 33   Cancer Maternal Aunt 74       leukemia   Cancer Paternal Aunt 78       breast   Breast cancer Paternal Aunt 79   Breast cancer Maternal Grandmother 17   Breast cancer Sister 57   Social History   Socioeconomic History   Marital status: Married    Spouse name: Not on file   Number of children: Not on file   Years of education: Not on file    Highest education level: Not on file  Occupational History   Occupation: OFFICE MANAGER    Employer: [employer]  Tobacco Use   Smoking status: Never   Smokeless tobacco: Never   Tobacco comments:    non smoker  Vaping Use   Vaping Use: Never used  Substance and Sexual Activity   Alcohol use: No    Alcohol/week: 0.0  standard drinks   Drug use: No   Sexual activity: Not Currently  Other Topics Concern   Not on file  Social History Narrative   Usually very active.      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   Social Determinants of Health   Financial Resource Strain: Low Risk    Difficulty of Paying Living Expenses: Not hard at all  Food Insecurity: No Food Insecurity   Worried About Charity fundraiser in the Last Year: Never true   [place] in the Last Year: Never true  Transportation Needs: No Transportation Needs   Lack of Transportation (Medical): No   Lack of Transportation (Non-Medical): No  Physical Activity: Sufficiently Active   Days of Exercise per Week: 4 days   Minutes of Exercise per Session: 40 min  Stress: No Stress Concern Present   Feeling of Stress : Not at all  Social Connections: Socially Integrated   Frequency of Communication with Friends and Family: More than three times a week   Frequency of Social Gatherings with Friends and Family: Three times a week   Attends Religious Services: More than 4 times per year   Active Member of Clubs or Organizations: Yes   Attends Music therapist: More than 4 times per year   Marital Status: Married    Tobacco Counseling Counseling given: Not Answered Tobacco comments: non smoker   Clinical Intake:  Pre-visit preparation completed: Yes  Pain : No/denies pain     Nutritional Risks: None Diabetes: No  How often do you need to have someone help you when you read instructions, pamphlets, or other written materials from your doctor or pharmacy?: 1 -  Never  Diabetic?  no  Interpreter Needed?: No  Information entered by :: Leroy Kennedy LPN   Activities of Daily Living    08/18/2021    8:56 AM  In your present state of health, do you have any difficulty performing the following activities:  Hearing? 0  Vision? 0  Difficulty concentrating or making decisions? 0  Walking or climbing stairs? 0  Dressing or bathing? 0  Doing errands, shopping? 0  Preparing Food and eating ? N  Using the Toilet? N  In the past six months, have you accidently leaked urine? N  Do you have problems with loss of bowel control? N  Managing your Medications? N  Managing your Finances? N  Housekeeping or managing your Housekeeping? N    Patient Care Team: Tower, Wynelle Fanny, MD as PCP - General (Family Medicine) Jovita Kussmaul, MD as Consulting Physician (General Surgery) Magrinat, Virgie Dad, MD (Inactive) as Consulting Physician (Oncology) Julian Hy, DO as Consulting Physician (Pulmonary Disease) Minus Breeding, MD as Consulting Physician (Cardiology)  Indicate any recent Medical Services you may have received from other than [facility] providers in the past year (date may be approximate).     Assessment:   This is a routine wellness examination for Sarah Young.  Hearing/Vision screen Hearing Screening - Comments:: No trouble hearing Vision Screening - Comments:: Up to date  [facility]  Dietary issues and exercise activities discussed: Current Exercise Habits: Home exercise routine, Type of exercise: walking, Time (Minutes): 40, Frequency (Times/Week): 4, Weekly Exercise (Minutes/Week): 160, Intensity: Mild, Exercise limited by: None identified   Goals Addressed             This Visit's Progress    Patient Stated       Continue current lifestyle  Depression Screen    08/18/2021    8:43 AM 08/14/2020    9:07 AM 07/27/2019   10:34 AM 05/02/2018    9:34 AM 11/19/2016    9:38 AM 09/18/2014    8:58 AM 08/12/2014    9:47 AM  PHQ 2/9 Scores   PHQ - 2 Score 0 0 0 0 0 0 0  PHQ- 9 Score   0 1 4      Fall Risk    08/14/2020    8:34 AM 07/27/2019   10:34 AM 11/19/2016    9:38 AM 09/18/2014    8:58 AM 08/12/2014    9:47 AM  Fall Risk   Falls in the past year? 0 0 No No No  Number falls in past yr:  0     Injury with Fall?  0     Risk for fall due to :  Medication side effect     Follow up  Falls evaluation completed;Falls prevention discussed       FALL RISK PREVENTION PERTAINING TO THE HOME:  Any stairs in or around the home? Yes  If so, are there any without handrails? No  Home free of loose throw rugs in walkways, pet beds, electrical cords, etc? Yes  Adequate lighting in your home to reduce risk of falls? Yes   ASSISTIVE DEVICES UTILIZED TO PREVENT FALLS:  Life alert? No  Use of a cane, walker or w/c? No  Grab bars in the bathroom? Yes  Shower chair or bench in shower? Yes  Elevated toilet seat or a handicapped toilet? Yes   TIMED UP AND GO:  Was the test performed? No .    Cognitive Function:    07/27/2019   10:37 AM  MMSE - Mini Mental State Exam  Orientation to time 5  Orientation to Place 5  Registration 3  Attention/ Calculation 5  Recall 3  Language- repeat 1        08/18/2021    8:39 AM  6CIT Screen  What Year? 0 points  What month? 0 points  What time? 0 points  Count back from 20 0 points  Months in reverse 0 points  Repeat phrase 0 points  Total Score 0 points    Immunizations Immunization History  Administered Date(s) Administered   Influenza Whole 12/02/2009   Influenza, High Dose Seasonal PF 02/06/2018, 01/05/2019, 01/17/2020, 01/23/2021   Influenza,inj,Quad PF,6+ Mos 04/20/2013, 03/04/2015, 12/15/2015   Influenza-Unspecified 01/24/2017   PFIZER(Purple Top)SARS-COV-2 Vaccination 05/05/2019, 05/30/2019   Pneumococcal Conjugate-13 03/04/2015   Pneumococcal Polysaccharide-23 05/11/2007   Td 10/23/2003    TDAP status: Due, Education has been provided regarding the importance  of this vaccine. Advised may receive this vaccine at local pharmacy or Health Dept. Aware to provide a copy of the vaccination record if obtained from local pharmacy or Health Dept. Verbalized acceptance and understanding.  Flu Vaccine status: Up to date  Pneumococcal vaccine status: Up to date  Covid-19 vaccine status: Information provided on how to obtain vaccines.   Qualifies for Shingles Vaccine? Yes   Zostavax completed No   Shingrix Completed?: No.    Education has been provided regarding the importance of this vaccine. Patient has been advised to call insurance company to determine out of pocket expense if they have not yet received this vaccine. Advised may also receive vaccine at local pharmacy or Health Dept. Verbalized acceptance and understanding.  Screening Tests Health Maintenance  Topic Date Due   Zoster Vaccines- Shingrix (1 of 2)  Never done   COVID-19 Vaccine (3 - Pfizer risk series) 09/03/2021 (Originally 06/27/2019)   TETANUS/TDAP  05/02/2028 (Originally 10/22/2013)   MAMMOGRAM  10/15/2021   INFLUENZA VACCINE  10/27/2021   Pneumonia Vaccine 59+ Years old  Completed   DEXA SCAN  Completed   HPV VACCINES  Aged Out    Health Maintenance  Health Maintenance Due  Topic Date Due   Zoster Vaccines- Shingrix (1 of 2) Never done    Colorectal cancer screening: No longer required.   Mammogram status: Completed  . Repeat every year  Bone Density 2022 every 2 years  Lung Cancer Screening: (Low Dose CT Chest recommended if Age 72-80 years, 30 pack-year currently smoking OR have quit w/in 15years.) does not qualify.   Lung Cancer Screening Referral:   Additional Screening:  Hepatitis C Screening: does not qualify  Vision Screening: Recommended annual ophthalmology exams for early detection of glaucoma and other disorders of the eye. Is the patient up to date with their annual eye exam?  Yes  Who is the provider or what is the name of the office in which the patient  attends annual eye exams? [facility] If pt is not established with a provider, would they like to be referred to a provider to establish care? No .   Dental Screening: Recommended annual dental exams for proper oral hygiene  Community Resource Referral / Chronic Care Management: CRR required this visit?  No   CCM required this visit?  No      Plan:     I have personally reviewed and noted the following in the patient's chart:   Medical and social history Use of alcohol, tobacco or illicit drugs  Current medications and supplements including opioid prescriptions.  Functional ability and status Nutritional status Physical activity Advanced directives List of other physicians Hospitalizations, surgeries, and ER visits in previous 12 months Vitals Screenings to include cognitive, depression, and falls Referrals and appointments  In addition, I have reviewed and discussed with patient certain preventive protocols, quality metrics, and best practice recommendations. A written personalized care plan for preventive services as well as general preventive health recommendations were provided to patient.     Leroy Kennedy, LPN   [id number]   Nurse Notes:

## 2021-08-21 ENCOUNTER — Other Ambulatory Visit: Payer: Self-pay | Admitting: Family Medicine

## 2021-09-03 DIAGNOSIS — H35363 Drusen (degenerative) of macula, bilateral: Secondary | ICD-10-CM | POA: Diagnosis not present

## 2021-09-03 DIAGNOSIS — H35373 Puckering of macula, bilateral: Secondary | ICD-10-CM | POA: Diagnosis not present

## 2021-09-03 DIAGNOSIS — H524 Presbyopia: Secondary | ICD-10-CM | POA: Diagnosis not present

## 2021-09-03 DIAGNOSIS — H35013 Changes in retinal vascular appearance, bilateral: Secondary | ICD-10-CM | POA: Diagnosis not present

## 2021-09-03 DIAGNOSIS — D3131 Benign neoplasm of right choroid: Secondary | ICD-10-CM | POA: Diagnosis not present

## 2021-10-27 ENCOUNTER — Encounter: Payer: Self-pay | Admitting: Emergency Medicine

## 2021-10-27 ENCOUNTER — Telehealth: Payer: Self-pay | Admitting: Family Medicine

## 2021-10-27 ENCOUNTER — Other Ambulatory Visit: Payer: Self-pay

## 2021-10-27 ENCOUNTER — Ambulatory Visit
Admission: EM | Admit: 2021-10-27 | Discharge: 2021-10-27 | Disposition: A | Discharge status: Home / Self Care | Payer: Medicare Other | Attending: Emergency Medicine | Admitting: Emergency Medicine

## 2021-10-27 DIAGNOSIS — R350 Frequency of micturition: Secondary | ICD-10-CM | POA: Diagnosis not present

## 2021-10-27 LAB — POCT URINALYSIS DIP (MANUAL ENTRY)
Bilirubin, UA: NEGATIVE
Glucose, UA: NEGATIVE mg/dL
Ketones, POC UA: NEGATIVE mg/dL
Nitrite, UA: NEGATIVE
Protein Ur, POC: NEGATIVE mg/dL
Spec Grav, UA: 1.01 (ref 1.010–1.025)
Urobilinogen, UA: 0.2 E.U./dL
pH, UA: 6.5 (ref 5.0–8.0)

## 2021-10-27 MED ORDER — CEPHALEXIN 500 MG PO CAPS: 500.0000 mg | ORAL_CAPSULE | Freq: Three times a day (TID) | ORAL | 0 refills | Status: DC

## 2021-10-27 NOTE — Telephone Encounter (Signed)
Patient called in stating she may have a possible UTI. Informed of Dr. Alba Cory first available appointment and looked at other provider appointments as well. Patient would like for either Dr. Glori Bickers or the CMA to give her a phone call. Thank you!

## 2021-10-27 NOTE — Discharge Instructions (Signed)
Your urinalysis shows Sarah Young blood cells and nitrates which are indicative of infection, your urine will be sent to the lab to determine exactly which bacteria is present, if any changes need to be made to your medications you will be notified  Begin use of Keflex 3 times daily (every 8 hours) for 5 days  You may use over-the-counter Azo to help minimize your symptoms until antibiotic removes bacteria, this medication will turn your urine orange  Increase your fluid intake through use of water  As always practice good hygiene, wiping front to back and avoidance of scented vaginal products to prevent further irritation  If symptoms continue to persist after use of medication or recur please follow-up with urgent care or your primary doctor as needed

## 2021-10-27 NOTE — ED Provider Notes (Signed)
Normal pressure [facility]    CSN: [id number] Arrival date & time: 10/27/21  1342      History   Chief Complaint Chief Complaint  Patient presents with   Urinary Tract Infection    HPI Sarah Young is a 81 y.o. female.   Patient presents with urinary frequency, urgency, abdominal pressure and lower back pain for 4 days.  Frequency is worsened overnight.  Has attempted use of Azo which has been minimally effective.  History of reoccurring UTIs.  Denies fever, chills, hematuria, vaginal discharge, itching or odor.  Past Medical History:  Diagnosis Date   Anxiety    Arthritis    OA of hands   Breast cancer of upper-outer quadrant of right female breast ([place]) 05/08/2014   DVT (deep venous thrombosis) (HCC) 1980s   Fibromyalgia    Hyperlipidemia    Hypertension    Irritable bowel syndrome    Migraine    Osteopenia    Personal history of radiation therapy    Pulmonary embolus ([place]) 2021   S/P radiation therapy 06/2014   Right breast 4250 cGy in 17 sessions, right breast boost 1200 cGy in 6 sessions  = 07/11/2014 through 08/12/2014                         Vitamin D deficiency    Wears glasses     Patient Active Problem List   Diagnosis Date Noted   Acute bronchitis 06/19/2021   Herpes zoster 02/02/2021   Medicare annual wellness visit, subsequent 08/14/2020   Aortic atherosclerosis ([place]) 08/23/2019   Dizziness 07/11/2019   Deep vein thrombosis (DVT) of left lower extremity ([place]) 05/25/2019   Hypercoagulopathy ([place]) 05/22/2019   History of pulmonary embolism 05/07/2019   Chest pain of unknown etiology -low risk for Cardiac Etiology 04/06/2019   SOB (shortness of breath) on exertion 04/06/2019   Osteopenia 05/02/2018   Routine general medical examination at a health care facility 11/21/2016   Estrogen deficiency 11/19/2016   Renal insufficiency 11/19/2016   Pedal edema 12/15/2015   Family history of breast cancer in female 05/17/2014   Malignant  neoplasm of upper-outer quadrant of right breast in female, estrogen receptor positive ([place]) 05/08/2014   GERD (gastroesophageal reflux disease) 03/01/2014   Lumbar degenerative disc disease 08/15/2013   Chronic pain of scapula 08/15/2013   Essential hypertension 10/02/2008   Migraine 09/06/2008   Vitamin D deficiency 06/27/2007   HYPERLIPIDEMIA 05/11/2007   Generalized anxiety disorder 05/11/2007   Disorder of bone and cartilage 05/11/2007   H/O poliomyelitis 05/08/2007   OSTEOARTHRITIS, HANDS, BILATERAL 05/08/2007   Fibromyalgia 05/08/2007   URINARY INCONTINENCE, STRESS, MILD 05/08/2007    Past Surgical History:  Procedure Laterality Date   ABDOMINAL HYSTERECTOMY  1972   APPENDECTOMY     BREAST EXCISIONAL BIOPSY Left    BREAST LUMPECTOMY Right 2016   BREAST SURGERY  1995   lumpectomy fibrocystic breast-lt   CATARACT EXTRACTION     both   CHOLECYSTECTOMY  2005   COLONOSCOPY     ovarian cyst removed  [place] SENTINEL LYMPH NODE BIOPSY Right 05/30/2014   Procedure: RADIOACTIVE SEED GUIDED PARTIAL MASTECTOMY WITH AXILLARY SENTINEL LYMPH NODE BIOPSY;  Surgeon: Autumn Messing III, MD;  Location: [facility];  Service: General;  Laterality: Right;    OB History   No obstetric history on file.      Home Medications  Prior to Admission medications   Medication Sig Start Date End Date Taking? Authorizing Provider  cephALEXin (KEFLEX) 500 MG capsule Take 1 capsule (500 mg total) by mouth 3 (three) times daily for 5 days. 10/27/21 11/01/21 Yes Edwin Cherian, Leitha Schuller, NP  acetaminophen (TYLENOL) 500 MG tablet Take 1,000 mg by mouth every 6 (six) hours as needed for mild pain (or headaches).    [provider]  amitriptyline (ELAVIL) 10 MG tablet TAKE 2 TABLETS (20 MG TOTAL) BY MOUTH AT BEDTIME. Patient taking differently: Take 10-20 mg by mouth at bedtime as needed for sleep. 08/14/20   Tower, Wynelle Fanny, MD  amLODipine  (NORVASC) 5 MG tablet Take 1 tablet (5 mg total) by mouth daily. NEEDS OFFICE VISIT 08/21/21   Tower, Wynelle Fanny, MD  Ascorbic Acid (VITAMIN C PO) Take 1 tablet by mouth every morning.    [provider]  azithromycin (ZITHROMAX Z-PAK) 250 MG tablet Take 2 pills by mouth today and then 1 pill daily for 4 days Patient not taking: Reported on 10/27/2021 06/19/21   Tower, Wynelle Fanny, MD  benzonatate (TESSALON) 200 MG capsule Take 1 capsule (200 mg total) by mouth 3 (three) times daily as needed for cough. Swallow whole Patient not taking: Reported on 08/18/2021 06/19/21   Tower, Wynelle Fanny, MD  Calcium Carb-Cholecalciferol (CALCIUM + D3 PO) Take 1 tablet by mouth in the morning.    [provider]  Cholecalciferol (VITAMIN D-3) 25 MCG (1000 UT) CAPS Take 1,000 Units by mouth daily.    [provider]  Cyanocobalamin (VITAMIN B-12 PO) Take 1 tablet by mouth every morning.    [provider]  ketoconazole (NIZORAL) 2 % cream Apply 1 application topically daily. Patient not taking: Reported on 08/18/2021 11/27/19   Magrinat, Virgie Dad, MD  Multiple Vitamins-Minerals (ZINC PO) Take 1 tablet by mouth 2 (two) times a week.    [provider]  Polyethyl Glycol-Propyl Glycol (SYSTANE) 0.4-0.3 % SOLN Place 1 drop into both eyes 2 (two) times daily.    [provider]  predniSONE (DELTASONE) 10 MG tablet Take 4 pills once daily by mouth for 3 days, then 3 pills daily for 3 days, then 2 pills daily for 3 days then 1 pill daily for 3 days then stop Patient not taking: Reported on 10/27/2021 06/19/21   Tower, Wynelle Fanny, MD  SUMAtriptan (IMITREX) 100 MG tablet TAKE 1 TABLET BY MOUTH AS NEEDED FOR MIGRAINE, MAY REPEAT IN 2 HOURS IF NEEDED. MAX 2 TABS IN 24 HOURS. Patient taking differently: Take 100 mg by mouth See admin instructions. Take 100 mg by mouth as needed for migraines- may repeat once in 2 hours, as needed (max of 2 tablets/200 mg in 24 hours) 12/28/20   Tower, Wynelle Fanny, MD   triamcinolone cream (KENALOG) 0.1 % Apply 1 application topically 2 (two) times daily. To affected area (insect sting) 11/19/19   Tower, Wynelle Fanny, MD  valACYclovir (VALTREX) 1000 MG tablet Take 1 tablet (1,000 mg total) by mouth 3 (three) times daily. 01/31/21   Sharion Balloon, NP  XARELTO 20 MG TABS tablet TAKE 1 TABLET BY MOUTH DAILY WITH SUPPER. 03/27/21   Tower, Wynelle Fanny, MD    Family History Family History  Problem Relation Age of Onset   Hypertension Father    Cancer Father 45       esophageal - smoker   Leukemia Mother    Cancer Mother 37       leukemia  Cancer Brother 6       breast CA   Breast cancer Brother 39   Cancer Maternal Aunt 80       leukemia   Cancer Paternal Aunt 78       breast   Breast cancer Paternal Aunt 23   Breast cancer Maternal Grandmother 70   Breast cancer Sister 26    Social History Social History   Tobacco Use   Smoking status: Never   Smokeless tobacco: Never   Tobacco comments:    non smoker  Vaping Use   Vaping Use: Never used  Substance Use Topics   Alcohol use: No    Alcohol/week: 0.0 standard drinks of alcohol   Drug use: No     Allergies   Amoxicillin, Amoxicillin-pot clavulanate, Esomeprazole magnesium, Gabapentin, Omeprazole, Other, Ranitidine hcl, and Wasp venom   Review of Systems Review of Systems  Constitutional: Negative.   Respiratory: Negative.    Genitourinary:  Positive for dysuria, flank pain, frequency, pelvic pain and urgency. Negative for decreased urine volume, difficulty urinating, dyspareunia, enuresis, genital sores, hematuria, menstrual problem, vaginal bleeding, vaginal discharge and vaginal pain.     Physical Exam Triage Vital Signs ED Triage Vitals  Enc Vitals Group     BP 10/27/21 1415 (!) 159/79     Pulse Rate 10/27/21 1415 74     Resp 10/27/21 1415 18     Temp 10/27/21 1415 97.9 F (36.6 C)     Temp Source 10/27/21 1415 Oral     SpO2 10/27/21 1415 98 %     Weight --      Height --       Head Circumference --      Peak Flow --      Pain Score 10/27/21 1412 8     Pain Loc --      Pain Edu? --      Excl. in [place]? --    No data found.  Updated Vital Signs BP (!) 159/79 (BP Location: Right Arm)   Pulse 74   Temp 97.9 F (36.6 C) (Oral)   Resp 18   SpO2 98%   Visual Acuity Right Eye Distance:   Left Eye Distance:   Bilateral Distance:    Right Eye Near:   Left Eye Near:    Bilateral Near:     Physical Exam Constitutional:      Appearance: Normal appearance.  Eyes:     Extraocular Movements: Extraocular movements intact.  Abdominal:     General: Abdomen is flat. Bowel sounds are normal.     Palpations: Abdomen is soft.     Tenderness: There is abdominal tenderness in the suprapubic area. There is left CVA tenderness. There is no right CVA tenderness or guarding. Negative signs include Murphy's sign.  Skin:    General: Skin is warm and dry.  Neurological:     Mental Status: She is alert and oriented to person, place, and time. Mental status is at baseline.  Psychiatric:        Mood and Affect: Mood normal.        Behavior: Behavior normal.      UC Treatments / Results  Labs (all labs ordered are listed, but only abnormal results are displayed) Labs Reviewed  POCT URINALYSIS DIP (MANUAL ENTRY) - Abnormal; Notable for the following components:      Result Value   Blood, UA small (*)    Leukocytes, UA Small (1+) (*)    All other  components within normal limits  URINE CULTURE    EKG   Radiology No results found.  Procedures Procedures (including critical care time)  Medications Ordered in UC Medications - No data to display  Initial Impression / Assessment and Plan / UC Course  I have reviewed the triage vital signs and the nursing notes.  Pertinent labs & imaging results that were available during my care of the patient were reviewed by me and considered in my medical decision making (see chart for details).  Urinary  frequency  Urinalysis positive for Venezia Sargeant blood cells, negative for nitrates, sent for culture, as patient is symptomatic we will prophylactically treat, Keflex 5-day course prescribed, recommended continued use of Pyridium if effective as well as over-the-counter analgesics, increase fluid intake and good hygiene for additional supportive measures, given strict precautions for persisting symptoms to follow-up with PCP or urgent care for reevaluation Final Clinical Impressions(s) / UC Diagnoses   Final diagnoses:  Urinary frequency     Discharge Instructions      Your urinalysis shows Adoria Kawamoto blood cells and nitrates which are indicative of infection, your urine will be sent to the lab to determine exactly which bacteria is present, if any changes need to be made to your medications you will be notified  Begin use of Keflex 3 times daily (every 8 hours) for 5 days  You may use over-the-counter Azo to help minimize your symptoms until antibiotic removes bacteria, this medication will turn your urine orange  Increase your fluid intake through use of water  As always practice good hygiene, wiping front to back and avoidance of scented vaginal products to prevent further irritation  If symptoms continue to persist after use of medication or recur please follow-up with urgent care or your primary doctor as needed    ED Prescriptions     Medication Sig Dispense Auth. Provider   cephALEXin (KEFLEX) 500 MG capsule Take 1 capsule (500 mg total) by mouth 3 (three) times daily for 5 days. 15 capsule Osmani Kersten, Leitha Schuller, NP      PDMP not reviewed this encounter.   Hans Eden, NP 10/27/21 1438

## 2021-10-27 NOTE — ED Triage Notes (Signed)
Patient has a history of UTI.  Patient reports having to get up several times over the last 3-4 nights to urinate.  Has lower back pain, lower abdominal tenderness

## 2021-10-27 NOTE — Telephone Encounter (Signed)
Called and spoke w/ pt  regarding this, pt was afraid that she was going in up  in the hospital like she was in the past. I advised that I would look for an appt for  to see if anyone had an opening for today or first think in the morning. I offer the option for patient to go to the Urgent Care in [facility] pt was not really willing to go there. I check the schedule to see if any provider had any opening for today as well as for tomorrow , also checked with [facility] to see if they had any opening there on no opening, spoke [place] ,Lpn  she advise pt  to follow up with urgent care. Pt wasn't happy that we didn't have opening for her, I apologize to patient explained that we check at office as well as our sister our for her.

## 2021-10-28 ENCOUNTER — Other Ambulatory Visit: Payer: Self-pay | Admitting: Family Medicine

## 2021-10-28 DIAGNOSIS — Z1231 Encounter for screening mammogram for malignant neoplasm of breast: Secondary | ICD-10-CM

## 2021-10-28 LAB — URINE CULTURE: Culture: NO GROWTH

## 2021-10-28 NOTE — Telephone Encounter (Signed)
Note in epic, pt was seen and taking keflex pending culture result

## 2021-10-30 ENCOUNTER — Ambulatory Visit (INDEPENDENT_AMBULATORY_CARE_PROVIDER_SITE_OTHER): Payer: Medicare Other | Admitting: Family Medicine

## 2021-10-30 ENCOUNTER — Encounter: Payer: Self-pay | Admitting: Family Medicine

## 2021-10-30 VITALS — BP 110/68 | HR 70 | Temp 97.8°F | Ht 65.0 in | Wt 153.6 lb

## 2021-10-30 DIAGNOSIS — N390 Urinary tract infection, site not specified: Secondary | ICD-10-CM | POA: Diagnosis not present

## 2021-10-30 DIAGNOSIS — R319 Hematuria, unspecified: Secondary | ICD-10-CM

## 2021-10-30 DIAGNOSIS — R350 Frequency of micturition: Secondary | ICD-10-CM | POA: Diagnosis not present

## 2021-10-30 DIAGNOSIS — N3000 Acute cystitis without hematuria: Secondary | ICD-10-CM

## 2021-10-30 LAB — POCT URINALYSIS DIP (CLINITEK)
Bilirubin, UA: NEGATIVE
Glucose, UA: NEGATIVE mg/dL
Ketones, POC UA: NEGATIVE mg/dL
Nitrite, UA: NEGATIVE
POC PROTEIN,UA: NEGATIVE
Spec Grav, UA: 1.015 (ref 1.010–1.025)
Urobilinogen, UA: 0.2 E.U./dL
pH, UA: 6 (ref 5.0–8.0)

## 2021-10-30 MED ORDER — SULFAMETHOXAZOLE-TRIMETHOPRIM 800-160 MG PO TABS: 1.0000 | ORAL_TABLET | Freq: Two times a day (BID) | ORAL | 0 refills | Status: DC

## 2021-10-30 NOTE — Assessment & Plan Note (Addendum)
Recently tx at [facility] with keflex-no clinical improvement and neg culture  (of note this happened also in the past)  Now ua is more positive and she is more symptomatic  Drinking water Px bactrim ds cx pending  ER precautions reviewed  Azo prn

## 2021-10-30 NOTE — Patient Instructions (Addendum)
Drink lots of water  Take the bactrim ds as directed (avoid the sun on this)   We sent your urine for culture  If symptoms worsen let us know / if severe go to the ER  We will contact you with the culture result

## 2021-10-30 NOTE — Progress Notes (Signed)
Subjective:    Patient ID: Sarah Young, female    DOB: [birth year], 81 y.o.   MRN: [id number]  HPI Pt presents for f/u of uti   Wt Readings from Last 3 Encounters:  10/30/21 153 lb 9.6 oz (69.7 kg)  02/02/21 151 lb (68.5 kg)  01/28/21 151 lb (68.5 kg)   25.56 kg/m   Was seen at [facility] on 10/27/21 A showed small blood and leukocytes  She was px keflex for 5 d  Culture noted no growth however and she was told to stop the abx   Per pt this has happened before and she still has symptoms  Pain and pressure over her bladder  Burning feeling  Urine today is still positive /moreso  Fatigue No fever but can not r/o borderline  No blood in urine  No back or flank pain  No appetite No n/v  Feels rotten   Frequency Getting up all night to urinate    Symptoms continue Taking keflex   Uses azo if needed   Results for orders placed or performed in visit on 10/30/21  POCT URINALYSIS DIP (CLINITEK)  Result Value Ref Range   Color, UA yellow yellow   Clarity, UA clear clear   Glucose, UA negative negative mg/dL   Bilirubin, UA negative negative   Ketones, POC UA negative negative mg/dL   Spec Grav, UA 1.015 1.010 - 1.025   Blood, UA moderate (A) negative   pH, UA 6.0 5.0 - 8.0   POC PROTEIN,UA negative negative, trace   Urobilinogen, UA 0.2 0.2 or 1.0 E.U./dL   Nitrite, UA Negative Negative   Leukocytes, UA Small (1+) (A) Negative     H/o renal insufficiency in the past Lab Results  Component Value Date   CREATININE 1.03 (H) 01/28/2021   BUN 18 01/28/2021   NA 140 01/28/2021   K 3.8 01/28/2021   CL 107 01/28/2021   CO2 26 01/28/2021   Last gfr 55 in nov  Patient Active Problem List   Diagnosis Date Noted   Acute cystitis 10/30/2021   Acute bronchitis 06/19/2021   Herpes zoster 02/02/2021   Medicare annual wellness visit, subsequent 08/14/2020   Aortic atherosclerosis ([place]) 08/23/2019   Dizziness 07/11/2019   Deep vein thrombosis (DVT) of left lower  extremity ([place]) 05/25/2019   Hypercoagulopathy ([place]) 05/22/2019   History of pulmonary embolism 05/07/2019   Chest pain of unknown etiology -low risk for Cardiac Etiology 04/06/2019   SOB (shortness of breath) on exertion 04/06/2019   Osteopenia 05/02/2018   Routine general medical examination at a health care facility 11/21/2016   Estrogen deficiency 11/19/2016   Renal insufficiency 11/19/2016   Pedal edema 12/15/2015   Family history of breast cancer in female 05/17/2014   Malignant neoplasm of upper-outer quadrant of right breast in female, estrogen receptor positive ([place]) 05/08/2014   GERD (gastroesophageal reflux disease) 03/01/2014   Lumbar degenerative disc disease 08/15/2013   Chronic pain of scapula 08/15/2013   Essential hypertension 10/02/2008   Migraine 09/06/2008   Vitamin D deficiency 06/27/2007   HYPERLIPIDEMIA 05/11/2007   Generalized anxiety disorder 05/11/2007   Disorder of bone and cartilage 05/11/2007   H/O poliomyelitis 05/08/2007   OSTEOARTHRITIS, HANDS, BILATERAL 05/08/2007   Fibromyalgia 05/08/2007   URINARY INCONTINENCE, STRESS, MILD 05/08/2007   Past Medical History:  Diagnosis Date   Anxiety    Arthritis    OA of hands   Breast cancer of upper-outer quadrant of right female breast ([place]) 05/08/2014  DVT (deep venous thrombosis) ([place]) 1980s   Fibromyalgia    Hyperlipidemia    Hypertension    Irritable bowel syndrome    Migraine    Osteopenia    Personal history of radiation therapy    Pulmonary embolus ([place]) 2021   S/P radiation therapy 06/2014   Right breast 4250 cGy in 17 sessions, right breast boost 1200 cGy in 6 sessions  = 07/11/2014 through 08/12/2014                         Vitamin D deficiency    Wears glasses    Past Surgical History:  Procedure Laterality Date   ABDOMINAL HYSTERECTOMY  1972   APPENDECTOMY     BREAST EXCISIONAL BIOPSY Left    BREAST LUMPECTOMY Right 2016   BREAST SURGERY  1995   lumpectomy fibrocystic breast-lt    CATARACT EXTRACTION     both   CHOLECYSTECTOMY  2005   COLONOSCOPY     ovarian cyst removed  [place] LYMPH NODE BIOPSY Right 05/30/2014   Procedure: RADIOACTIVE SEED GUIDED PARTIAL MASTECTOMY WITH AXILLARY SENTINEL LYMPH NODE BIOPSY;  Surgeon: Autumn Messing III, MD;  Location: [facility];  Service: General;  Laterality: Right;   Social History   Tobacco Use   Smoking status: Never   Smokeless tobacco: Never   Tobacco comments:    non smoker  Vaping Use   Vaping Use: Never used  Substance Use Topics   Alcohol use: No    Alcohol/week: 0.0 standard drinks of alcohol   Drug use: No   Family History  Problem Relation Age of Onset   Hypertension Father    Cancer Father 60       esophageal - smoker   Leukemia Mother    Cancer Mother 66       leukemia   Cancer Brother 34       breast CA   Breast cancer Brother 72   Cancer Maternal Aunt 80       leukemia   Cancer Paternal Aunt 78       breast   Breast cancer Paternal Aunt 21   Breast cancer Maternal Grandmother 58   Breast cancer Sister 65   Allergies  Allergen Reactions   Amoxicillin Itching and Other (See Comments)     Paradoxical Reaction- made infection worse    Amoxicillin-Pot Clavulanate Other (See Comments)    Felt like body turned "inside out"   Esomeprazole Magnesium Nausea And Vomiting   Gabapentin Other (See Comments)    Caused nightmares   Omeprazole Nausea And Vomiting   Other Itching, Swelling and Other (See Comments)    Ant venom = Made the site (where bitten) swell   Ranitidine Hcl Other (See Comments)    "Not effective"   Wasp Venom Swelling and Other (See Comments)    Made leg swell where stung   Current Outpatient Medications on File Prior to Visit  Medication Sig Dispense Refill   acetaminophen (TYLENOL) 500 MG tablet Take 1,000 mg by mouth every 6 (six) hours as needed for mild pain (or headaches).     amitriptyline  (ELAVIL) 10 MG tablet TAKE 2 TABLETS (20 MG TOTAL) BY MOUTH AT BEDTIME. (Patient taking differently: Take 10-20 mg by mouth at bedtime as needed for sleep.) 180 tablet 3   amLODipine (NORVASC) 5 MG tablet Take 1 tablet (5 mg total) by mouth daily.  NEEDS OFFICE VISIT 90 tablet 0   Ascorbic Acid (VITAMIN C PO) Take 1 tablet by mouth every morning.     azithromycin (ZITHROMAX Z-PAK) 250 MG tablet Take 2 pills by mouth today and then 1 pill daily for 4 days 6 tablet 0   benzonatate (TESSALON) 200 MG capsule Take 1 capsule (200 mg total) by mouth 3 (three) times daily as needed for cough. Swallow whole 30 capsule 1   Calcium Carb-Cholecalciferol (CALCIUM + D3 PO) Take 1 tablet by mouth in the morning.     Cholecalciferol (VITAMIN D-3) 25 MCG (1000 UT) CAPS Take 1,000 Units by mouth daily.     Cyanocobalamin (VITAMIN B-12 PO) Take 1 tablet by mouth every morning.     ketoconazole (NIZORAL) 2 % cream Apply 1 application topically daily. 15 g 0   Multiple Vitamins-Minerals (ZINC PO) Take 1 tablet by mouth 2 (two) times a week.     Polyethyl Glycol-Propyl Glycol (SYSTANE) 0.4-0.3 % SOLN Place 1 drop into both eyes 2 (two) times daily.     predniSONE (DELTASONE) 10 MG tablet Take 4 pills once daily by mouth for 3 days, then 3 pills daily for 3 days, then 2 pills daily for 3 days then 1 pill daily for 3 days then stop 30 tablet 0   SUMAtriptan (IMITREX) 100 MG tablet TAKE 1 TABLET BY MOUTH AS NEEDED FOR MIGRAINE, MAY REPEAT IN 2 HOURS IF NEEDED. MAX 2 TABS IN 24 HOURS. (Patient taking differently: Take 100 mg by mouth See admin instructions. Take 100 mg by mouth as needed for migraines- may repeat once in 2 hours, as needed (max of 2 tablets/200 mg in 24 hours)) 9 tablet 9   triamcinolone cream (KENALOG) 0.1 % Apply 1 application topically 2 (two) times daily. To affected area (insect sting) 15 g 0   valACYclovir (VALTREX) 1000 MG tablet Take 1 tablet (1,000 mg total) by mouth 3 (three) times daily. 21 tablet 0    XARELTO 20 MG TABS tablet TAKE 1 TABLET BY MOUTH DAILY WITH SUPPER. 90 tablet 3   No current facility-administered medications on file prior to visit.    Review of Systems  Constitutional:  Positive for fatigue. Negative for activity change, appetite change and fever.  HENT:  Negative for congestion and sore throat.   Eyes:  Negative for itching and visual disturbance.  Respiratory:  Negative for cough and shortness of breath.   Cardiovascular:  Negative for leg swelling.  Gastrointestinal:  Negative for abdominal distention, abdominal pain, constipation, diarrhea and nausea.  Endocrine: Negative for cold intolerance and polydipsia.  Genitourinary:  Positive for dysuria, frequency and urgency. Negative for difficulty urinating, flank pain and hematuria.  Musculoskeletal:  Negative for myalgias.  Skin:  Negative for rash.  Allergic/Immunologic: Negative for immunocompromised state.  Neurological:  Negative for dizziness and weakness.  Hematological:  Negative for adenopathy.       Objective:   Physical Exam Constitutional:      General: She is not in acute distress.    Appearance: She is well-developed and normal weight.  HENT:     Head: Normocephalic and atraumatic.  Eyes:     Conjunctiva/sclera: Conjunctivae normal.     Pupils: Pupils are equal, round, and reactive to light.  Cardiovascular:     Rate and Rhythm: Normal rate and regular rhythm.     Heart sounds: Normal heart sounds.  Pulmonary:     Effort: Pulmonary effort is normal.     Breath sounds: Normal  breath sounds.  Abdominal:     General: Bowel sounds are normal. There is no distension.     Palpations: Abdomen is soft.     Tenderness: There is abdominal tenderness. There is no rebound.     Comments: No cva tenderness  Mild suprapubic tenderness  Musculoskeletal:     Cervical back: Normal range of motion and neck supple.  Lymphadenopathy:     Cervical: No cervical adenopathy.  Skin:    Findings: No rash.   Neurological:     Mental Status: She is alert.           Assessment & Plan:   Problem List Items Addressed This Visit       Genitourinary   Acute cystitis    Recently tx at UC with keflex-no clinical improvement and neg culture  (of note this happened also in the past)  Now ua is more positive and she is more symptomatic  Drinking water Px bactrim ds cx pending  ER precautions reviewed  Azo prn      Other Visit Diagnoses     Urinary frequency    -  Primary   Relevant Orders   POCT URINALYSIS DIP (CLINITEK) (Completed)   Urine Culture   Urinary tract infection with hematuria, site unspecified       Relevant Medications   sulfamethoxazole-trimethoprim (BACTRIM DS) 800-160 MG tablet   Other Relevant Orders   POCT URINALYSIS DIP (CLINITEK) (Completed)   Urine Culture

## 2021-10-31 LAB — URINE CULTURE
MICRO NUMBER:: 13737509
SPECIMEN QUALITY:: ADEQUATE

## 2021-11-02 ENCOUNTER — Telehealth: Payer: Self-pay

## 2021-11-02 ENCOUNTER — Ambulatory Visit: Payer: Medicare Other | Admitting: Family Medicine

## 2021-11-02 NOTE — Telephone Encounter (Signed)
Patient called in returning call she missed.  

## 2021-11-02 NOTE — Telephone Encounter (Signed)
-----   Message from Abner Greenspan, MD sent at 11/01/2021 12:38 PM EDT ----- Your urine culture showed very small amount of bacteria consistent with contamination  Let us know if symptoms are not better on the antibliotic

## 2021-11-02 NOTE — Telephone Encounter (Signed)
Called and lm with husband for pat't to call us back.

## 2021-11-02 NOTE — Telephone Encounter (Signed)
Called and spoke with patient regarding lab results.

## 2021-11-12 ENCOUNTER — Ambulatory Visit
Admission: RE | Admit: 2021-11-12 | Discharge: 2021-11-12 | Disposition: A | Discharge status: Home / Self Care | Payer: Medicare Other | Source: Ambulatory Visit | Attending: Family Medicine | Admitting: Family Medicine

## 2021-11-12 DIAGNOSIS — Z1231 Encounter for screening mammogram for malignant neoplasm of breast: Secondary | ICD-10-CM | POA: Diagnosis not present

## 2021-11-17 ENCOUNTER — Other Ambulatory Visit: Payer: Self-pay | Admitting: Family Medicine

## 2021-11-17 NOTE — Telephone Encounter (Signed)
Called and lvm for patient to call us back to schedule a Cpe  with Dr Glori Bickers.

## 2021-12-05 ENCOUNTER — Other Ambulatory Visit: Payer: Self-pay | Admitting: Family Medicine

## 2021-12-08 ENCOUNTER — Encounter: Payer: Self-pay | Admitting: Family Medicine

## 2021-12-08 ENCOUNTER — Telehealth (INDEPENDENT_AMBULATORY_CARE_PROVIDER_SITE_OTHER): Payer: Medicare Other | Admitting: Family Medicine

## 2021-12-08 VITALS — Temp 99.6°F | Ht 65.0 in

## 2021-12-08 DIAGNOSIS — U071 COVID-19: Secondary | ICD-10-CM | POA: Diagnosis not present

## 2021-12-08 MED ORDER — MOLNUPIRAVIR EUA 200MG CAPSULE: 4.0000 | ORAL_CAPSULE | Freq: Two times a day (BID) | ORAL | 0 refills | Status: AC

## 2021-12-08 NOTE — Progress Notes (Signed)
VIRTUAL VISIT A virtual visit is felt to be most appropriate for this patient at this time.   I connected with the patient on 12/08/21 at 12:00 PM EDT by virtual telehealth platform and verified that I am speaking with the correct person using two identifiers.   I discussed the limitations, risks, security and privacy concerns of performing an evaluation and management service by  virtual telehealth platform and the availability of in person appointments. I also discussed with the patient that there may be a patient responsible charge related to this service. The patient expressed understanding and agreed to proceed.  Patient location: Home Provider Location: [facility] [place] Participants: Eliezer Lofts and Wyona Almas   Chief Complaint  Patient presents with   Covid Positive    Positive home test this morning Symptoms started yesterday   Sore Throat   Headache   Nasal Congestion    History of Present Illness: 81 year old female with history of aortic atherosclerosis, hypertension, breast cancer, pulmonary embolus and renal insufficiency presents with new onset sore throat headache and nasal congestion.  Date of onset:12/07/2021 Initial symptoms included headache, fatigue, felt flu like.  Weak and body aches. Symptoms progressed to ST and congestion. Tightness  in chest, pressure, no SOB. Coughing up mucus. Low grade temperature.  She has tried tylenol. She has some benzonatate.   She has no history of known chronic lung disease. She has had COVID  3 times prior.. hx of blood clot in lung in past.   GFR 55 on 01/2021 most recent labs.  COVID 19 screen COVID testing:yes positive on 12/08/2021 COVID vaccine: Pfizer May 30, 2019 AND May 05, 2019 COVID exposure: No recent travel or known exposure to [place] [facility] SHE WAS [facility] W[facility].  The importance of social distancing was discussed today.    Review of Systems  Constitutional:  Positive for  malaise/fatigue.  HENT:  Positive for congestion and sore throat.   Respiratory:  Negative for cough and shortness of breath.       Past Medical History:  Diagnosis Date   Anxiety    Arthritis    OA of hands   Breast cancer of upper-outer quadrant of right female breast ([employer] [facility]) 05/08/2014   DVT (deep venous thrombosis) (HCC) 1980s   Fibromyalgia    Hyperlipidemia    Hypertension    Irritable bowel syndrome    Migraine    Osteopenia    Personal history of radiation therapy    Pulmonary embolus ([place]) 2021   S/P radiation therapy 06/2014   Right breast 4250 cGy in 17 sessions, right breast boost 1200 cGy in 6 sessions  = 07/11/2014 through 08/12/2014                         Vitamin D deficiency    Wears glasses     reports that she has never smoked. She has never used smokeless tobacco. She reports that she does not drink alcohol and does not use drugs.   Current Outpatient Medications:    acetaminophen (TYLENOL) 500 MG tablet, Take 1,000 mg by mouth every 6 (six) hours as needed for mild pain (or headaches)., Disp: , Rfl:    amitriptyline (ELAVIL) 10 MG tablet, Take 10-20 mg by mouth at bedtime., Disp: , Rfl:    amLODipine (NORVASC) 5 MG tablet, TAKE 1 TABLET (5 MG TOTAL) BY MOUTH DAILY. NEEDS OFFICE VISIT, Disp: 90 tablet, Rfl: 0   Ascorbic Acid (VITAMIN C PO),  Take 1 tablet by mouth every morning., Disp: , Rfl:    benzonatate (TESSALON) 200 MG capsule, Take 1 capsule (200 mg total) by mouth 3 (three) times daily as needed for cough. Swallow whole, Disp: 30 capsule, Rfl: 1   Calcium Carb-Cholecalciferol (CALCIUM + D3 PO), Take 1 tablet by mouth in the morning., Disp: , Rfl:    Cholecalciferol (VITAMIN D-3) 25 MCG (1000 UT) CAPS, Take 1,000 Units by mouth daily., Disp: , Rfl:    Cyanocobalamin (VITAMIN B-12 PO), Take 1 tablet by mouth every morning., Disp: , Rfl:    Multiple Vitamins-Minerals (ZINC PO), Take 1 tablet by mouth 2 (two) times a week., Disp: , Rfl:    Polyethyl  Glycol-Propyl Glycol (SYSTANE) 0.4-0.3 % SOLN, Place 1 drop into both eyes 2 (two) times daily., Disp: , Rfl:    SUMAtriptan (IMITREX) 100 MG tablet, TAKE 1 TABLET BY MOUTH AS NEEDED FOR MIGRAINE, MAY REPEAT IN 2 HOURS IF NEEDED. MAX 2 TABS IN 24 HOURS., Disp: 9 tablet, Rfl: 9   XARELTO 20 MG TABS tablet, TAKE 1 TABLET BY MOUTH DAILY WITH SUPPER., Disp: 90 tablet, Rfl: 3   Observations/Objective: Temperature 99.6 F (37.6 C), temperature source Oral, height '5\' 5"'$  (1.651 m).  Physical Exam  Physical Exam Constitutional:      General: The patient is not in acute distress. Pulmonary:     Effort: Pulmonary effort is normal. No respiratory distress.  Neurological:     Mental Status: The patient is alert and oriented to person, place, and time.  Psychiatric:        Mood and Affect: Mood normal.        Behavior: Behavior normal.   Assessment and Plan Problem List Items Addressed This Visit     COVID-19 - Primary    COVID19  Infection < 5 days from onset of symptoms in double vaccinated overweight individual with history of HTN, age > 57.  No clear sign of bacterial infection at this time.   No SOB.  No red flags/need for ER visit or in-person exam at respiratory clinic at this time..    Pt higher risk for COVID complications given HTN, age. GFR  < 60 and has medication contraindications for paxlovid  Start molnupiravir 5 day course. Reviewed course of medication and side effect profile with patient in detail.   Symptomatic care with tylenol, mucinex and cough suppressant as needed.. she has benzonatate. If SOB begins symptoms worsening.. have low threshold for in-person exam, if severe shortness of breath ER visit recommended.  Can monitor Oxygen saturation at home with home monitor if able to obtain.  Go to ER if O2 sat < 90% on room air.   Reviewed home care and provided information through [place].  Recommended quarantine 5 days isolation recommended. Return to work day 6 and wear  mask for 4 more days to complete 10 days. Provided info about prevention of spread of COVID 19.       Relevant Medications   molnupiravir EUA (LAGEVRIO) 200 mg CAPS capsule      I discussed the assessment and treatment plan with the patient. The patient was provided an opportunity to ask questions and all were answered. The patient agreed with the plan and demonstrated an understanding of the instructions.   The patient was advised to call back or seek an in-person evaluation if the symptoms worsen or if the condition fails to improve as anticipated.     Eliezer Lofts, MD

## 2021-12-08 NOTE — Assessment & Plan Note (Signed)
COVID19  Infection < 5 days from onset of symptoms in double vaccinated overweight individual with history of HTN, age > 7.  No clear sign of bacterial infection at this time.   No SOB.  No red flags/need for ER visit or in-person exam at respiratory clinic at this time..    Pt higher risk for COVID complications given HTN, age. GFR  < 60 and has medication contraindications for paxlovid  Start molnupiravir 5 day course. Reviewed course of medication and side effect profile with patient in detail.   Symptomatic care with tylenol, mucinex and cough suppressant as needed.. she has benzonatate. If SOB begins symptoms worsening.. have low threshold for in-person exam, if severe shortness of breath ER visit recommended.  Can monitor Oxygen saturation at home with home monitor if able to obtain.  Go to ER if O2 sat < 90% on room air.   Reviewed home care and provided information through [facility].  Recommended quarantine 5 days isolation recommended. Return to work day 6 and wear mask for 4 more days to complete 10 days. Provided info about prevention of spread of COVID 19.

## 2021-12-09 ENCOUNTER — Encounter: Payer: Medicare Other | Admitting: Family Medicine

## 2021-12-18 DIAGNOSIS — M5451 Vertebrogenic low back pain: Secondary | ICD-10-CM | POA: Diagnosis not present

## 2021-12-21 ENCOUNTER — Ambulatory Visit: Payer: Medicare Other | Admitting: Family Medicine

## 2021-12-30 ENCOUNTER — Ambulatory Visit (INDEPENDENT_AMBULATORY_CARE_PROVIDER_SITE_OTHER): Payer: Medicare Other | Admitting: Family Medicine

## 2021-12-30 ENCOUNTER — Encounter: Payer: Self-pay | Admitting: Family Medicine

## 2021-12-30 VITALS — BP 122/60 | HR 76 | Temp 97.7°F | Ht 63.75 in | Wt 150.5 lb

## 2021-12-30 DIAGNOSIS — Z23 Encounter for immunization: Secondary | ICD-10-CM

## 2021-12-30 DIAGNOSIS — Z0000 Encounter for general adult medical examination without abnormal findings: Secondary | ICD-10-CM

## 2021-12-30 DIAGNOSIS — E782 Mixed hyperlipidemia: Secondary | ICD-10-CM

## 2021-12-30 DIAGNOSIS — I10 Essential (primary) hypertension: Secondary | ICD-10-CM | POA: Diagnosis not present

## 2021-12-30 DIAGNOSIS — D6859 Other primary thrombophilia: Secondary | ICD-10-CM | POA: Diagnosis not present

## 2021-12-30 DIAGNOSIS — M810 Age-related osteoporosis without current pathological fracture: Secondary | ICD-10-CM

## 2021-12-30 DIAGNOSIS — E559 Vitamin D deficiency, unspecified: Secondary | ICD-10-CM | POA: Diagnosis not present

## 2021-12-30 LAB — CBC WITH DIFFERENTIAL/PLATELET
Basophils Absolute: 0.1 10*3/uL (ref 0.0–0.1)
Basophils Relative: 0.9 % (ref 0.0–3.0)
Eosinophils Absolute: 0.1 10*3/uL (ref 0.0–0.7)
Eosinophils Relative: 1 % (ref 0.0–5.0)
HCT: 40.8 % (ref 36.0–46.0)
Hemoglobin: 13.3 g/dL (ref 12.0–15.0)
Lymphocytes Relative: 25.7 % (ref 12.0–46.0)
Lymphs Abs: 1.6 10*3/uL (ref 0.7–4.0)
MCHC: 32.7 g/dL (ref 30.0–36.0)
MCV: 90.2 fl (ref 78.0–100.0)
Monocytes Absolute: 0.6 10*3/uL (ref 0.1–1.0)
Monocytes Relative: 10.1 % (ref 3.0–12.0)
Neutro Abs: 3.9 10*3/uL (ref 1.4–7.7)
Neutrophils Relative %: 62.3 % (ref 43.0–77.0)
Platelets: 235 10*3/uL (ref 150.0–400.0)
RBC: 4.53 Mil/uL (ref 3.87–5.11)
RDW: 15 % (ref 11.5–15.5)
WBC: 6.3 10*3/uL (ref 4.0–10.5)

## 2021-12-30 LAB — COMPREHENSIVE METABOLIC PANEL
ALT: 12 U/L (ref 0–35)
AST: 17 U/L (ref 0–37)
Albumin: 3.9 g/dL (ref 3.5–5.2)
Alkaline Phosphatase: 65 U/L (ref 39–117)
BUN: 24 mg/dL — ABNORMAL HIGH (ref 6–23)
CO2: 28 mEq/L (ref 19–32)
Calcium: 9.2 mg/dL (ref 8.4–10.5)
Chloride: 104 mEq/L (ref 96–112)
Creatinine, Ser: 1.27 mg/dL — ABNORMAL HIGH (ref 0.40–1.20)
GFR: 39.62 mL/min — ABNORMAL LOW (ref >60.00)
Glucose, Bld: 62 mg/dL — ABNORMAL LOW (ref 70–99)
Potassium: 3.9 mEq/L (ref 3.5–5.1)
Sodium: 139 mEq/L (ref 135–145)
Total Bilirubin: 0.5 mg/dL (ref 0.2–1.2)
Total Protein: 6.3 g/dL (ref 6.0–8.3)

## 2021-12-30 LAB — LIPID PANEL
Cholesterol: 238 mg/dL — ABNORMAL HIGH (ref 0–200)
HDL: 81.6 mg/dL (ref >39.00)
LDL Cholesterol: 138 mg/dL — ABNORMAL HIGH (ref 0–99)
NonHDL: 156.32
Total CHOL/HDL Ratio: 3
Triglycerides: 90 mg/dL (ref 0.0–149.0)
VLDL: 18 mg/dL (ref 0.0–40.0)

## 2021-12-30 LAB — VITAMIN D 25 HYDROXY (VIT D DEFICIENCY, FRACTURES): VITD: 44.14 ng/mL (ref 30.00–100.00)

## 2021-12-30 LAB — TSH: TSH: 2.78 u[IU]/mL (ref 0.35–5.50)

## 2021-12-30 NOTE — Assessment & Plan Note (Signed)
Level drawn Disc imp for bone and overall health

## 2021-12-30 NOTE — Assessment & Plan Note (Signed)
Tolerates xarelto well  H/o dvt and PE in past

## 2021-12-30 NOTE — Assessment & Plan Note (Signed)
Disc goals for lipids and reasons to control them Rev last labs with pt Rev low sat fat diet in detail Labs ordered  Diet controlled

## 2021-12-30 NOTE — Progress Notes (Signed)
Subjective:    Patient ID: Sarah Young, female    DOB: [birth year], 81 y.o.   MRN: [id number]  HPI Here for health maintenance exam and to review chronic medical problems    Immunization History  Administered Date(s) Administered   Influenza Whole 12/02/2009   Influenza, High Dose Seasonal PF 02/06/2018, 01/05/2019, 01/17/2020, 01/23/2021   Influenza,inj,Quad PF,6+ Mos 04/20/2013, 03/04/2015, 12/15/2015   Influenza-Unspecified 01/24/2017   PFIZER(Purple Top)SARS-COV-2 Vaccination 05/05/2019, 05/30/2019   Pneumococcal Conjugate-13 03/04/2015   Pneumococcal Polysaccharide-23 05/11/2007   Td 10/23/2003   Health Maintenance Due  Topic Date Due   Zoster Vaccines- Shingrix (1 of 2) Never done   COVID-19 Vaccine (3 - Pfizer risk series) 06/27/2019   INFLUENZA VACCINE  10/27/2021   Had covid illness in mid sept  This is the 4th time  Was sick/ had diarrhea with it  Feeling better now but was tired   Then her back went out on her  Went to [place] - and was given a few shots and put on muscle relaxer  Sciatic symptoms  Has f/u with Dr Jacelyn Grip upcoming- may get another shot  Trying to do her PT exercises   Tries to keep mentally sharp   Shingrix: declines   Flu shot : today   Mammogram 10/2021  Personal h/o breast cancer  Self breast exam: no lumps or changes  Has fam history also - brother and sister  Was tested and no one had a positive genetic profile    Colonoscopy 09 Declines further colon cancer screen   Dexa  08/2020 -now in OP range  Past tamoxifen   Falls- none Fractures-none  Supplements  ca and D Due for D level today  Exercise : has not been able to do much due to her back problems  Likes to walk  Up until 3 wk ago- she walked more than 30 min per day   No upcoming dental work  Sister has OP   HTN bp is stable today  No cp or palpitations or headaches or edema  No side effects to medicines  BP Readings from Last 3 Encounters:   12/30/21 122/60  10/30/21 110/68  10/27/21 (!) 159/79      Amlodipine 5 mg daily    Lab Results  Component Value Date   CREATININE 1.03 (H) 01/28/2021   BUN 18 01/28/2021   NA 140 01/28/2021   K 3.8 01/28/2021   CL 107 01/28/2021   CO2 26 01/28/2021   Drinking fluids A good water drinker  No nsaids   Myofascial pain syndrome -deals with it and keeps going  Elavil 10-20 mg qhs   Hyperlipidemia  Lab Results  Component Value Date   CHOL 256 (H) 08/07/2020   HDL 91.80 08/07/2020   LDLCALC 150 (H) 08/07/2020   LDLDIRECT 143.9 04/20/2013   TRIG 68.0 08/07/2020   CHOLHDL 3 08/07/2020   Takes xarelto for coagulopathy with PE in the past   Patient Active Problem List   Diagnosis Date Noted   Herpes zoster 02/02/2021   Medicare annual wellness visit, subsequent 08/14/2020   Aortic atherosclerosis ([place]) 08/23/2019   Dizziness 07/11/2019   Deep vein thrombosis (DVT) of left lower extremity ([place]) 05/25/2019   Hypercoagulopathy ([place]) 05/22/2019   History of pulmonary embolism 05/07/2019   SOB (shortness of breath) on exertion 04/06/2019   Osteoporosis 05/02/2018   Routine general medical examination at a health care facility 11/21/2016   Estrogen deficiency 11/19/2016   Pedal edema 12/15/2015  Family history of breast cancer in female 05/17/2014   Malignant neoplasm of upper-outer quadrant of right breast in female, estrogen receptor positive ([place]) 05/08/2014   GERD (gastroesophageal reflux disease) 03/01/2014   Lumbar degenerative disc disease 08/15/2013   Chronic pain of scapula 08/15/2013   Essential hypertension 10/02/2008   Migraine 09/06/2008   Vitamin D deficiency 06/27/2007   HYPERLIPIDEMIA 05/11/2007   Generalized anxiety disorder 05/11/2007   Disorder of bone and cartilage 05/11/2007   H/O poliomyelitis 05/08/2007   OSTEOARTHRITIS, HANDS, BILATERAL 05/08/2007   Fibromyalgia 05/08/2007   URINARY INCONTINENCE, STRESS, MILD 05/08/2007   Past Medical  History:  Diagnosis Date   Anxiety    Arthritis    OA of hands   Breast cancer of upper-outer quadrant of right female breast ([place]) 05/08/2014   DVT (deep venous thrombosis) ([facility]) 1980s   Fibromyalgia    Hyperlipidemia    Hypertension    Irritable bowel syndrome    Migraine    Osteopenia    Personal history of radiation therapy    Pulmonary embolus ([place]) 2021   S/P radiation therapy 06/2014   Right breast 4250 cGy in 17 sessions, right breast boost 1200 cGy in 6 sessions  = 07/11/2014 through 08/12/2014                         Vitamin D deficiency    Wears glasses    Past Surgical History:  Procedure Laterality Date   ABDOMINAL HYSTERECTOMY  1972   APPENDECTOMY     BREAST EXCISIONAL BIOPSY Left    BREAST LUMPECTOMY Right 2016   BREAST SURGERY  1995   lumpectomy fibrocystic breast-lt   CATARACT EXTRACTION     both   CHOLECYSTECTOMY  2005   COLONOSCOPY     ovarian cyst removed  [place] PARTIAL MASTECTOMY WITH AXILLARY SENTINEL LYMPH NODE BIOPSY Right 05/30/2014   Procedure: RADIOACTIVE SEED GUIDED PARTIAL MASTECTOMY WITH AXILLARY SENTINEL LYMPH NODE BIOPSY;  Surgeon: Autumn Messing III, MD;  Location: [facility];  Service: General;  Laterality: Right;   Social History   Tobacco Use   Smoking status: Never   Smokeless tobacco: Never   Tobacco comments:    non smoker  Vaping Use   Vaping Use: Never used  Substance Use Topics   Alcohol use: No    Alcohol/week: 0.0 standard drinks of alcohol   Drug use: No   Family History  Problem Relation Age of Onset   Hypertension Father    Cancer Father 71       esophageal - smoker   Leukemia Mother    Cancer Mother 50       leukemia   Cancer Brother 18       breast CA   Breast cancer Brother 33   Cancer Maternal Aunt 80       leukemia   Cancer Paternal Aunt 78       breast   Breast cancer Paternal Aunt 79   Breast cancer Maternal Grandmother 75   Breast cancer Sister 110   Allergies   Allergen Reactions   Amoxicillin Itching and Other (See Comments)     Paradoxical Reaction- made infection worse    Amoxicillin-Pot Clavulanate Other (See Comments)    Felt like body turned "inside out"   Esomeprazole Magnesium Nausea And Vomiting   Gabapentin Other (See Comments)    Caused nightmares   Omeprazole Nausea And Vomiting   Other  Itching, Swelling and Other (See Comments)    Ant venom = Made the site (where bitten) swell   Ranitidine Hcl Other (See Comments)    "Not effective"   Wasp Venom Swelling and Other (See Comments)    Made leg swell where stung   Current Outpatient Medications on File Prior to Visit  Medication Sig Dispense Refill   acetaminophen (TYLENOL) 500 MG tablet Take 1,000 mg by mouth every 6 (six) hours as needed for mild pain (or headaches).     amitriptyline (ELAVIL) 10 MG tablet Take 10-20 mg by mouth at bedtime.     amLODipine (NORVASC) 5 MG tablet TAKE 1 TABLET (5 MG TOTAL) BY MOUTH DAILY. NEEDS OFFICE VISIT 90 tablet 0   Ascorbic Acid (VITAMIN C PO) Take 1 tablet by mouth every morning.     benzonatate (TESSALON) 200 MG capsule Take 1 capsule (200 mg total) by mouth 3 (three) times daily as needed for cough. Swallow whole 30 capsule 1   Calcium Carb-Cholecalciferol (CALCIUM + D3 PO) Take 1 tablet by mouth in the morning.     Cholecalciferol (VITAMIN D-3) 25 MCG (1000 UT) CAPS Take 1,000 Units by mouth daily.     Cyanocobalamin (VITAMIN B-12 PO) Take 1 tablet by mouth every morning.     methocarbamol (ROBAXIN) 500 MG tablet Take 1-2 tablets by mouth 3 (three) times daily as needed.     Multiple Vitamins-Minerals (ZINC PO) Take 1 tablet by mouth 2 (two) times a week.     Polyethyl Glycol-Propyl Glycol (SYSTANE) 0.4-0.3 % SOLN Place 1 drop into both eyes 2 (two) times daily.     SUMAtriptan (IMITREX) 100 MG tablet TAKE 1 TABLET BY MOUTH AS NEEDED FOR MIGRAINE, MAY REPEAT IN 2 HOURS IF NEEDED. MAX 2 TABS IN 24 HOURS. 9 tablet 9   XARELTO 20 MG TABS  tablet TAKE 1 TABLET BY MOUTH DAILY WITH SUPPER. 90 tablet 3   No current facility-administered medications on file prior to visit.     Review of Systems  Constitutional:  Positive for fatigue. Negative for activity change, appetite change, fever and unexpected weight change.  HENT:  Negative for congestion, ear pain, rhinorrhea, sinus pressure and sore throat.   Eyes:  Negative for pain, redness and visual disturbance.  Respiratory:  Negative for cough, shortness of breath and wheezing.   Cardiovascular:  Negative for chest pain and palpitations.  Gastrointestinal:  Negative for abdominal pain, blood in stool, constipation and diarrhea.  Endocrine: Negative for polydipsia and polyuria.  Genitourinary:  Negative for dysuria, frequency and urgency.  Musculoskeletal:  Positive for arthralgias, back pain and myalgias.  Skin:  Negative for pallor and rash.  Allergic/Immunologic: Negative for environmental allergies.  Neurological:  Negative for dizziness, syncope and headaches.  Hematological:  Negative for adenopathy. Does not bruise/bleed easily.  Psychiatric/Behavioral:  Negative for decreased concentration and dysphoric mood. The patient is not nervous/anxious.        Objective:   Physical Exam Constitutional:      General: She is not in acute distress.    Appearance: Normal appearance. She is well-developed. She is not ill-appearing or diaphoretic.  HENT:     Head: Normocephalic and atraumatic.     Right Ear: Tympanic membrane, ear canal and external ear normal.     Left Ear: Tympanic membrane, ear canal and external ear normal.     Nose: Nose normal. No congestion.     Mouth/Throat:     Mouth: Mucous membranes are moist.  Pharynx: Oropharynx is clear. No posterior oropharyngeal erythema.  Eyes:     General: No scleral icterus.    Extraocular Movements: Extraocular movements intact.     Conjunctiva/sclera: Conjunctivae normal.     Pupils: Pupils are equal, round, and  reactive to light.  Neck:     Thyroid: No thyromegaly.     Vascular: No carotid bruit or JVD.  Cardiovascular:     Rate and Rhythm: Normal rate and regular rhythm.     Pulses: Normal pulses.     Heart sounds: Normal heart sounds.     No gallop.  Pulmonary:     Effort: Pulmonary effort is normal. No respiratory distress.     Breath sounds: Normal breath sounds. No wheezing.     Comments: Good air exch Chest:     Chest wall: No tenderness.  Abdominal:     General: Bowel sounds are normal. There is no distension or abdominal bruit.     Palpations: Abdomen is soft. There is no mass.     Tenderness: There is no abdominal tenderness.     Hernia: No hernia is present.  Genitourinary:    Comments: Breast exam: No mass, nodules, thickening, tenderness, bulging, retraction, inflamation, nipple discharge or skin changes noted.  No axillary or clavicular LA.     Post cancer changes noted  Musculoskeletal:        General: No tenderness. Normal range of motion.     Cervical back: Normal range of motion and neck supple. No rigidity. No muscular tenderness.     Right lower leg: No edema.     Left lower leg: No edema.     Comments: No kyphosis   Lymphadenopathy:     Cervical: No cervical adenopathy.  Skin:    General: Skin is warm and dry.     Coloration: Skin is not pale.     Findings: No erythema or rash.     Comments: Solar lentigines diffusely Many scattered SKs  Neurological:     Mental Status: She is alert. Mental status is at baseline.     Cranial Nerves: No cranial nerve deficit.     Motor: No abnormal muscle tone.     Coordination: Coordination normal.     Gait: Gait normal.     Deep Tendon Reflexes: Reflexes are normal and symmetric. Reflexes normal.  Psychiatric:        Mood and Affect: Mood normal.        Cognition and Memory: Cognition and memory normal.           Assessment & Plan:   Problem List Items Addressed This Visit       Cardiovascular and Mediastinum    Essential hypertension (Chronic)   Relevant Orders   TSH   Lipid panel   Comprehensive metabolic panel   CBC with Differential/Platelet     Musculoskeletal and Integument   Osteoporosis    dexa reviewed 6/22  No falls or fx Taking ca and D Exercise is limited by pain   Disc option of alendronate for tx No upcoming dental issues She does have GERD Given info to read and she will call back if she wants to try it         Other   HYPERLIPIDEMIA (Chronic)    Disc goals for lipids and reasons to control them Rev last labs with pt Rev low sat fat diet in detail Labs ordered  Diet controlled       Relevant Orders  Lipid panel   Hypercoagulopathy ([place])    Tolerates xarelto well  H/o dvt and PE in past      Routine general medical examination at a health care facility - Primary    Reviewed health habits including diet and exercise and skin cancer prevention Reviewed appropriate screening tests for age  Also reviewed health mt list, fam hx and immunization status , as well as social and family history   See HPI Declines shingrix vaccine  Flu shot given  Mammogram utd  Declines further colon cancer screening  dexa reviewed 08/2020 with OP,disc treatment opt Taking ca and D , enc exercise as tol       Relevant Orders   Flu Vaccine QUAD High Dose(Fluad) (Completed)   Vitamin D deficiency    Level drawn Disc imp for bone and overall health       Relevant Orders   VITAMIN D 25 Hydroxy (Vit-D Deficiency, Fractures)   Other Visit Diagnoses     Need for influenza vaccination       Relevant Orders   Flu Vaccine QUAD High Dose(Fluad) (Completed)

## 2021-12-30 NOTE — Patient Instructions (Addendum)
Look at the information on osteoporosis and alendronate  Let me know if you want to try the alendronate Take the dexa report to orthopedics also   When you feel better-think about resistance training to gain muscle  Take care of yourself   Flu shot today   Labs today

## 2021-12-30 NOTE — Assessment & Plan Note (Signed)
dexa reviewed 6/22  No falls or fx Taking ca and D Exercise is limited by pain   Disc option of alendronate for tx No upcoming dental issues She does have GERD Given info to read and she will call back if she wants to try it

## 2021-12-30 NOTE — Assessment & Plan Note (Signed)
Reviewed health habits including diet and exercise and skin cancer prevention Reviewed appropriate screening tests for age  Also reviewed health mt list, fam hx and immunization status , as well as social and family history   See HPI Declines shingrix vaccine  Flu shot given  Mammogram utd  Declines further colon cancer screening  dexa reviewed 08/2020 with OP,disc treatment opt Taking ca and D , enc exercise as tol

## 2022-01-05 ENCOUNTER — Telehealth: Payer: Self-pay | Admitting: *Deleted

## 2022-01-05 NOTE — Telephone Encounter (Signed)
Left VM requesting pt to call the office back 

## 2022-01-05 NOTE — Telephone Encounter (Signed)
-----   Message from Abner Greenspan, MD sent at 12/30/2021  7:53 PM EDT ----- Cholesterol is improved a bit but not quite at goal Avoid red meat/ fried foods/ egg yolks/ fatty breakfast meats/ butter, cheese and high fat dairy/ and shellfish   Kidney numbers are down- have you been drinking less fluid or taking any anti inflammatory medicines? Do you have any new urinary symptoms?  (Please let me know) Other labs are stable  Vitamin D level is ok

## 2022-01-07 NOTE — Telephone Encounter (Signed)
Patient called back in returning a call she received.  

## 2022-01-08 NOTE — Telephone Encounter (Signed)
Addressed through result notes  

## 2022-01-15 DIAGNOSIS — M5416 Radiculopathy, lumbar region: Secondary | ICD-10-CM | POA: Diagnosis not present

## 2022-01-18 DIAGNOSIS — M545 Low back pain, unspecified: Secondary | ICD-10-CM | POA: Diagnosis not present

## 2022-01-25 DIAGNOSIS — M533 Sacrococcygeal disorders, not elsewhere classified: Secondary | ICD-10-CM | POA: Diagnosis not present

## 2022-02-10 DIAGNOSIS — M533 Sacrococcygeal disorders, not elsewhere classified: Secondary | ICD-10-CM | POA: Diagnosis not present

## 2022-02-15 ENCOUNTER — Other Ambulatory Visit: Payer: Self-pay | Admitting: Family Medicine

## 2022-03-09 DIAGNOSIS — M533 Sacrococcygeal disorders, not elsewhere classified: Secondary | ICD-10-CM | POA: Diagnosis not present

## 2022-03-17 ENCOUNTER — Other Ambulatory Visit: Payer: Self-pay | Admitting: Family Medicine

## 2022-03-17 NOTE — Telephone Encounter (Signed)
CPE was on 12/30/21, last filled on 12/28/20 #9 tabs with 9 refills

## 2022-03-26 ENCOUNTER — Other Ambulatory Visit: Payer: Self-pay | Admitting: Family Medicine

## 2022-03-26 NOTE — Telephone Encounter (Signed)
Last filled on 03/27/21 #90 tabs/ 3 refills, CPE was on 12/30/21

## 2022-04-07 ENCOUNTER — Other Ambulatory Visit: Payer: Self-pay | Admitting: Family Medicine

## 2022-04-19 DIAGNOSIS — C44622 Squamous cell carcinoma of skin of right upper limb, including shoulder: Secondary | ICD-10-CM | POA: Diagnosis not present

## 2022-04-19 DIAGNOSIS — D225 Melanocytic nevi of trunk: Secondary | ICD-10-CM | POA: Diagnosis not present

## 2022-04-19 DIAGNOSIS — L821 Other seborrheic keratosis: Secondary | ICD-10-CM | POA: Diagnosis not present

## 2022-04-19 DIAGNOSIS — D492 Neoplasm of unspecified behavior of bone, soft tissue, and skin: Secondary | ICD-10-CM | POA: Diagnosis not present

## 2022-04-19 DIAGNOSIS — L814 Other melanin hyperpigmentation: Secondary | ICD-10-CM | POA: Diagnosis not present

## 2022-05-03 DIAGNOSIS — C44622 Squamous cell carcinoma of skin of right upper limb, including shoulder: Secondary | ICD-10-CM | POA: Diagnosis not present

## 2022-05-19 DIAGNOSIS — L089 Local infection of the skin and subcutaneous tissue, unspecified: Secondary | ICD-10-CM | POA: Diagnosis not present

## 2022-05-27 ENCOUNTER — Encounter: Payer: Self-pay | Admitting: Internal Medicine

## 2022-05-27 ENCOUNTER — Ambulatory Visit (INDEPENDENT_AMBULATORY_CARE_PROVIDER_SITE_OTHER): Payer: Medicare Other | Admitting: Internal Medicine

## 2022-05-27 VITALS — BP 134/72 | HR 63 | Temp 97.9°F | Ht 63.75 in | Wt 154.0 lb

## 2022-05-27 DIAGNOSIS — R040 Epistaxis: Secondary | ICD-10-CM | POA: Insufficient documentation

## 2022-05-27 NOTE — Patient Instructions (Signed)
Please use one spray of afrin every morning on that left side---till you get in with the ENT specialist.

## 2022-05-27 NOTE — Assessment & Plan Note (Signed)
Asked her to try a spray of afrin every morning for now Will set up with ENT as she is on the xarelto

## 2022-05-27 NOTE — Progress Notes (Signed)
Subjective:    Patient ID: Sarah Young, female    DOB: November 24, 1940, 82 y.o.   MRN: [id number]  HPI Here due to recurrent nose bleeds  Everyday nose bleeds for the past 2 weeks---will "gush" out of left nare No provocation Happened in store/church She will pinch it and plug with cotton ball May take 15-20 minutes to stop Usually happens in the morning as she is getting ready to go to work  No recent trauma Is on the xarelto No nasal sprays  Has been using vaseline to lubricate--but that has started the bleeding at times  Current Outpatient Medications on File Prior to Visit  Medication Sig Dispense Refill   acetaminophen (TYLENOL) 500 MG tablet Take 1,000 mg by mouth every 6 (six) hours as needed for mild pain (or headaches).     amitriptyline (ELAVIL) 10 MG tablet TAKE 2 TABLETS (20 MG TOTAL) BY MOUTH AT BEDTIME. 180 tablet 2   amLODipine (NORVASC) 5 MG tablet Take 1 tablet (5 mg total) by mouth daily. 90 tablet 2   Ascorbic Acid (VITAMIN C PO) Take 1 tablet by mouth every morning.     Calcium Carb-Cholecalciferol (CALCIUM + D3 PO) Take 1 tablet by mouth in the morning.     Cholecalciferol (VITAMIN D-3) 25 MCG (1000 UT) CAPS Take 1,000 Units by mouth daily.     Cyanocobalamin (VITAMIN B-12 PO) Take 1 tablet by mouth every morning.     Multiple Vitamins-Minerals (ZINC PO) Take 1 tablet by mouth 2 (two) times a week.     Polyethyl Glycol-Propyl Glycol (SYSTANE) 0.4-0.3 % SOLN Place 1 drop into both eyes 2 (two) times daily.     rivaroxaban (XARELTO) 20 MG TABS tablet TAKE 1 TABLET BY MOUTH DAILY WITH SUPPER. 90 tablet 3   SUMAtriptan (IMITREX) 100 MG tablet TAKE 1 TABLET BY MOUTH AS NEEDED FOR MIGRAINE, MAY REPEAT IN 2 HOURS IF NEEDED. MAX 2 TABS IN 24 HOURS. 9 tablet 11   No current facility-administered medications on file prior to visit.    Allergies  Allergen Reactions   Amoxicillin Itching and Other (See Comments)     Paradoxical Reaction- made infection worse     Amoxicillin-Pot Clavulanate Other (See Comments)    Felt like body turned "inside out"   Esomeprazole Magnesium Nausea And Vomiting   Gabapentin Other (See Comments)    Caused nightmares   Omeprazole Nausea And Vomiting   Other Itching, Swelling and Other (See Comments)    Ant venom = Made the site (where bitten) swell   Ranitidine Hcl Other (See Comments)    "Not effective"   Wasp Venom Swelling and Other (See Comments)    Made leg swell where stung    Past Medical History:  Diagnosis Date   Anxiety    Arthritis    OA of hands   Breast cancer of upper-outer quadrant of right female breast ([place]) 05/08/2014   DVT (deep venous thrombosis) ([place]) 1980s   Fibromyalgia    Hyperlipidemia    Hypertension    Irritable bowel syndrome    Migraine    Osteopenia    Personal history of radiation therapy    Pulmonary embolus ([place]) 2021   S/P radiation therapy 06/2014   Right breast 4250 cGy in 17 sessions, right breast boost 1200 cGy in 6 sessions  = 07/11/2014 through 08/12/2014  Vitamin D deficiency    Wears glasses     Past Surgical History:  Procedure Laterality Date   ABDOMINAL HYSTERECTOMY  1972   APPENDECTOMY     BREAST EXCISIONAL BIOPSY Left    BREAST LUMPECTOMY Right 2016   BREAST SURGERY  1995   lumpectomy fibrocystic breast-lt   CATARACT EXTRACTION     both   CHOLECYSTECTOMY  2005   COLONOSCOPY     ovarian cyst removed  1985   RADIOACTIVE SEED GUIDED PARTIAL MASTECTOMY WITH AXILLARY SENTINEL LYMPH NODE BIOPSY Right 05/30/2014   Procedure: RADIOACTIVE SEED GUIDED PARTIAL MASTECTOMY WITH AXILLARY SENTINEL LYMPH NODE BIOPSY;  Surgeon: Autumn Messing III, MD;  Location: [facility];  Service: General;  Laterality: Right;    Family History  Problem Relation Age of Onset   Hypertension Father    Cancer Father 73       esophageal - smoker   Leukemia Mother    Cancer Mother 27       leukemia   Cancer Brother 40       breast CA   Breast  cancer Brother 7   Cancer Maternal Aunt 7       leukemia   Cancer Paternal Aunt 78       breast   Breast cancer Paternal Aunt 64   Breast cancer Maternal Grandmother 62   Breast cancer Sister 64    Social History   Socioeconomic History   Marital status: Married    Spouse name: Not on file   Number of children: Not on file   Years of education: Not on file   Highest education level: Not on file  Occupational History   Occupation: OFFICE MANAGER    Employer: [employer]  Tobacco Use   Smoking status: Never   Smokeless tobacco: Never   Tobacco comments:    non smoker  Vaping Use   Vaping Use: Never used  Substance and Sexual Activity   Alcohol use: No    Alcohol/week: 0.0 standard drinks of alcohol   Drug use: No   Sexual activity: Not Currently  Other Topics Concern   Not on file  Social History Narrative   Usually very active.      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   Social Determinants of Health   Financial Resource Strain: Low Risk  (08/18/2021)   Overall Financial Resource Strain (CARDIA)    Difficulty of Paying Living Expenses: Not hard at all  Food Insecurity: No Food Insecurity (08/18/2021)   Hunger Vital Sign    Worried About Running Out of Food in the Last Year: Never true    Ran Out of Food in the Last Year: Never true  Transportation Needs: No Transportation Needs (08/18/2021)   PRAPARE - Hydrologist (Medical): No    Lack of Transportation (Non-Medical): No  Physical Activity: Sufficiently Active (08/18/2021)   Exercise Vital Sign    Days of Exercise per Week: 4 days    Minutes of Exercise per Session: 40 min  Stress: No Stress Concern Present (08/18/2021)   [place]    Feeling of Stress : Not at all  Social Connections: [place] (08/18/2021)   Social Connection and Isolation Panel [NHANES]    Frequency of  Communication with Friends and Family: More than three times a week    Frequency of Social Gatherings with Friends and Family: Three times a week  Attends Religious Services: More than 4 times per year    Active Member of Clubs or Organizations: Yes    Attends Archivist Meetings: More than 4 times per year    Marital Status: Married  Human resources officer Violence: Not At Risk (08/18/2021)   Humiliation, Afraid, Rape, and Kick questionnaire    Fear of Current or Ex-Partner: No    Emotionally Abused: No    Physically Abused: No    Sexually Abused: No   Review of Systems No fever, infection or sinus symptoms    Objective:   Physical Exam HENT:     Nose:     Comments: No sig swelling No clear bleeding spot on either side           Assessment & Plan:

## 2022-05-28 ENCOUNTER — Encounter: Payer: Self-pay | Admitting: *Deleted

## 2022-06-10 NOTE — Telephone Encounter (Signed)
Patient declined referral.

## 2022-06-28 DIAGNOSIS — M79662 Pain in left lower leg: Secondary | ICD-10-CM | POA: Diagnosis not present

## 2022-07-13 ENCOUNTER — Ambulatory Visit (INDEPENDENT_AMBULATORY_CARE_PROVIDER_SITE_OTHER): Payer: Medicare Other | Admitting: Nurse Practitioner

## 2022-07-13 ENCOUNTER — Encounter: Payer: Self-pay | Admitting: Nurse Practitioner

## 2022-07-13 VITALS — BP 120/62 | HR 101 | Temp 97.9°F | Ht 63.75 in | Wt 153.6 lb

## 2022-07-13 DIAGNOSIS — L03116 Cellulitis of left lower limb: Secondary | ICD-10-CM | POA: Insufficient documentation

## 2022-07-13 MED ORDER — CEPHALEXIN 500 MG PO CAPS
500.0000 mg | ORAL_CAPSULE | Freq: Three times a day (TID) | ORAL | 0 refills | Status: DC
Start: 2022-07-13 — End: 2022-07-19

## 2022-07-13 NOTE — Progress Notes (Signed)
Bethanie Dicker, NP-C Phone: [phone]  Sarah Young is a 82 y.o. female who presents today for left leg warmth and tenderness.   Patient reports hitting her shin approximately one month ago. She was evaluated by Ortho at the time and had an x-ray done. She reports 3-4 days ago noticing redness along the area she hit and swelling in her leg down to her ankle. Pain is a 4/10. The skin is starting to blister in some areas. She is able to bear weight. Denies fever/chills. Denies shortness of breath. She has a Hx of cellulitis in the same leg.    Social History   Tobacco Use  Smoking Status Never  Smokeless Tobacco Never  Tobacco Comments   non smoker    Current Outpatient Medications on File Prior to Visit  Medication Sig Dispense Refill   acetaminophen (TYLENOL) 500 MG tablet Take 1,000 mg by mouth every 6 (six) hours as needed for mild pain (or headaches).     amitriptyline (ELAVIL) 10 MG tablet TAKE 2 TABLETS (20 MG TOTAL) BY MOUTH AT BEDTIME. 180 tablet 2   amLODipine (NORVASC) 5 MG tablet Take 1 tablet (5 mg total) by mouth daily. 90 tablet 2   Ascorbic Acid (VITAMIN C PO) Take 1 tablet by mouth every morning.     Calcium Carb-Cholecalciferol (CALCIUM + D3 PO) Take 1 tablet by mouth in the morning.     Cholecalciferol (VITAMIN D-3) 25 MCG (1000 UT) CAPS Take 1,000 Units by mouth daily.     Cyanocobalamin (VITAMIN B-12 PO) Take 1 tablet by mouth every morning.     Multiple Vitamins-Minerals (ZINC PO) Take 1 tablet by mouth 2 (two) times a week.     Polyethyl Glycol-Propyl Glycol (SYSTANE) 0.4-0.3 % SOLN Place 1 drop into both eyes 2 (two) times daily.     rivaroxaban (XARELTO) 20 MG TABS tablet TAKE 1 TABLET BY MOUTH DAILY WITH SUPPER. 90 tablet 3   SUMAtriptan (IMITREX) 100 MG tablet TAKE 1 TABLET BY MOUTH AS NEEDED FOR MIGRAINE, MAY REPEAT IN 2 HOURS IF NEEDED. MAX 2 TABS IN 24 HOURS. 9 tablet 11   No current facility-administered medications on file prior to visit.     ROS see history of present illness  Objective  Physical Exam Vitals:   07/13/22 1429  BP: 120/62  Pulse: (!) 101  Temp: 97.9 F (36.6 C)  SpO2: 99%    BP Readings from Last 3 Encounters:  07/13/22 120/62  05/27/22 134/72  12/30/21 122/60   Wt Readings from Last 3 Encounters:  07/13/22 153 lb 9.6 oz (69.7 kg)  05/27/22 154 lb (69.9 kg)  12/30/21 150 lb 8 oz (68.3 kg)    Physical Exam Constitutional:      General: She is not in acute distress.    Appearance: Normal appearance.  HENT:     Head: Normocephalic.  Cardiovascular:     Rate and Rhythm: Normal rate and regular rhythm.     Heart sounds: Normal heart sounds.  Pulmonary:     Effort: Pulmonary effort is normal.     Breath sounds: Normal breath sounds.  Musculoskeletal:     Left lower leg: Edema present.  Skin:    General: Skin is warm and dry.     Findings: Erythema present.     Comments: Warmth present. Edema noted down leg into ankle. Mild tenderness on palpation. No drainage present.   Neurological:     General: No focal deficit present.     Mental  Status: She is alert.  Psychiatric:        Mood and Affect: Mood normal.        Behavior: Behavior normal.      Assessment/Plan: Please see individual problem list.  Cellulitis of left lower extremity Assessment & Plan: Consistent with cellulitis. Will treat with Cephalexin 500 mg TID. Encouraged patient to elevate and ice. Strict return precautions given to patient.   Orders: -     Cephalexin; Take 1 capsule (500 mg total) by mouth 3 (three) times daily.  Dispense: 21 capsule; Refill: 0   Return if symptoms worsen or fail to improve.   Bethanie Dicker, NP-C [facility] - [employer]

## 2022-07-13 NOTE — Assessment & Plan Note (Signed)
Consistent with cellulitis. Will treat with Cephalexin 500 mg TID. Encouraged patient to elevate and ice. Strict return precautions given to patient.

## 2022-07-19 ENCOUNTER — Encounter: Payer: Self-pay | Admitting: Family Medicine

## 2022-07-19 ENCOUNTER — Ambulatory Visit (INDEPENDENT_AMBULATORY_CARE_PROVIDER_SITE_OTHER): Payer: Medicare Other | Admitting: Family Medicine

## 2022-07-19 VITALS — BP 122/70 | HR 71 | Temp 97.6°F | Ht 63.75 in | Wt 151.0 lb

## 2022-07-19 DIAGNOSIS — L03116 Cellulitis of left lower limb: Secondary | ICD-10-CM

## 2022-07-19 MED ORDER — SULFAMETHOXAZOLE-TRIMETHOPRIM 800-160 MG PO TABS: 1.0000 | ORAL_TABLET | Freq: Two times a day (BID) | ORAL | 0 refills | Status: DC

## 2022-07-19 NOTE — Patient Instructions (Addendum)
Keep area clean with soap and water   Finish the cephalexin  Start bactim DS as directed Use sun protection while on this medicine  Follow up in about a week (call if worse before then) Can cancel if much better    Watch for increased redness / swelling/ pain or if you run a fever   Continue to elevate Try a mildly warm compress  now -10 minutes at a time

## 2022-07-19 NOTE — Progress Notes (Signed)
Subjective:    Patient ID: Sarah Young, female    DOB: 1940-08-29, 82 y.o.   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   Patient Active Problem List   Diagnosis Date Noted   Cellulitis of left lower extremity 07/13/2022   Recurrent epistaxis 05/27/2022   Herpes zoster 02/02/2021   Medicare annual wellness visit, subsequent 08/14/2020   Aortic atherosclerosis 08/23/2019   Dizziness 07/11/2019   Deep vein thrombosis (DVT) of left lower extremity 05/25/2019   Hypercoagulopathy 05/22/2019   History of pulmonary embolism 05/07/2019   SOB (shortness of breath) on exertion 04/06/2019   Osteoporosis 05/02/2018   Routine general medical examination at a health care facility 11/21/2016   Estrogen deficiency 11/19/2016   Pedal edema 12/15/2015   Family history of breast cancer in female 05/17/2014   Malignant neoplasm of upper-outer quadrant of right breast in female, estrogen receptor positive 05/08/2014   GERD (gastroesophageal reflux disease) 03/01/2014   Lumbar degenerative disc disease 08/15/2013   Chronic pain of scapula 08/15/2013   Essential hypertension 10/02/2008   Migraine 09/06/2008   Vitamin D deficiency 06/27/2007   HYPERLIPIDEMIA 05/11/2007   Generalized anxiety disorder 05/11/2007   Disorder of bone and cartilage 05/11/2007   H/O poliomyelitis 05/08/2007   OSTEOARTHRITIS, HANDS, BILATERAL 05/08/2007    Fibromyalgia 05/08/2007   URINARY INCONTINENCE, STRESS, MILD 05/08/2007   Past Medical History:  Diagnosis Date   Anxiety    Arthritis    OA of hands   Breast cancer of upper-outer quadrant of right female breast 05/08/2014   DVT (deep venous thrombosis) 1980s   Fibromyalgia    Hyperlipidemia    Hypertension    Irritable bowel syndrome    Migraine    Osteopenia    Personal history of radiation therapy    Pulmonary embolus 2021   S/P radiation therapy 06/2014   Right breast 4250 cGy in 17 sessions, right breast boost 1200 cGy in 6 sessions  = 07/11/2014 through 08/12/2014                         Vitamin D deficiency    Wears glasses    Past Surgical History:  Procedure Laterality Date   ABDOMINAL HYSTERECTOMY  1972   APPENDECTOMY     BREAST EXCISIONAL BIOPSY Left    BREAST LUMPECTOMY Right 2016   BREAST SURGERY  1995   lumpectomy fibrocystic breast-lt   CATARACT EXTRACTION     both   CHOLECYSTECTOMY  2005   COLONOSCOPY     ovarian cyst removed  1985   RADIOACTIVE SEED GUIDED PARTIAL MASTECTOMY WITH AXILLARY SENTINEL LYMPH NODE BIOPSY Right 05/30/2014   Procedure: RADIOACTIVE SEED GUIDED PARTIAL MASTECTOMY WITH AXILLARY SENTINEL LYMPH NODE BIOPSY;  Surgeon: Chevis Pretty III, MD;  Location: [facility];  Service:  General;  Laterality: Right;   Social History   Tobacco Use   Smoking status: Never   Smokeless tobacco: Never   Tobacco comments:    non smoker  Vaping Use   Vaping Use: Never used  Substance Use Topics   Alcohol use: No    Alcohol/week: 0.0 standard drinks of alcohol   Drug use: No   Family History  Problem Relation Age of Onset   Hypertension Father    Cancer Father 71       esophageal - smoker   Leukemia Mother    Cancer Mother 58       leukemia   Cancer Brother 82       breast CA   Breast cancer Brother 10   Cancer Maternal Aunt 73       leukemia   Cancer Paternal Aunt 6       breast   Breast cancer Paternal Aunt 28   Breast  cancer Maternal Grandmother 22   Breast cancer Sister 49   Allergies  Allergen Reactions   Amoxicillin Itching and Other (See Comments)     Paradoxical Reaction- made infection worse    Amoxicillin-Pot Clavulanate Other (See Comments)    Felt like body turned "inside out"   Esomeprazole Magnesium Nausea And Vomiting   Gabapentin Other (See Comments)    Caused nightmares   Omeprazole Nausea And Vomiting   Other Itching, Swelling and Other (See Comments)    Ant venom = Made the site (where bitten) swell   Ranitidine Hcl Other (See Comments)    "Not effective"   Wasp Venom Swelling and Other (See Comments)    Made leg swell where stung   Current Outpatient Medications on File Prior to Visit  Medication Sig Dispense Refill   acetaminophen (TYLENOL) 500 MG tablet Take 1,000 mg by mouth every 6 (six) hours as needed for mild pain (or headaches).     amitriptyline (ELAVIL) 10 MG tablet TAKE 2 TABLETS (20 MG TOTAL) BY MOUTH AT BEDTIME. 180 tablet 2   amLODipine (NORVASC) 5 MG tablet Take 1 tablet (5 mg total) by mouth daily. 90 tablet 2   Ascorbic Acid (VITAMIN C PO) Take 1 tablet by mouth every morning.     Calcium Carb-Cholecalciferol (CALCIUM + D3 PO) Take 1 tablet by mouth in the morning.     Cholecalciferol (VITAMIN D-3) 25 MCG (1000 UT) CAPS Take 1,000 Units by mouth daily.     Cyanocobalamin (VITAMIN B-12 PO) Take 1 tablet by mouth every morning.     Multiple Vitamins-Minerals (ZINC PO) Take 1 tablet by mouth 2 (two) times a week.     Polyethyl Glycol-Propyl Glycol (SYSTANE) 0.4-0.3 % SOLN Place 1 drop into both eyes 2 (two) times daily.     rivaroxaban (XARELTO) 20 MG TABS tablet TAKE 1 TABLET BY MOUTH DAILY WITH SUPPER. 90 tablet 3   SUMAtriptan (IMITREX) 100 MG tablet TAKE 1 TABLET BY MOUTH AS NEEDED FOR MIGRAINE, MAY REPEAT IN 2 HOURS IF NEEDED. MAX 2 TABS IN 24 HOURS. 9 tablet 11   No current facility-administered medications on file prior to visit.    Review of Systems   Constitutional:  Negative for activity change, appetite change, fatigue, fever and unexpected weight change.  HENT:  Negative for congestion, ear pain, rhinorrhea, sinus pressure and sore throat.   Eyes:  Negative for pain, redness and visual disturbance.  Respiratory:  Negative for cough, shortness of breath and wheezing.   Cardiovascular:  Negative for  chest pain and palpitations.  Gastrointestinal:  Negative for abdominal pain, blood in stool, constipation and diarrhea.  Endocrine: Negative for polydipsia and polyuria.  Genitourinary:  Negative for dysuria, frequency and urgency.  Musculoskeletal:  Negative for arthralgias, back pain and myalgias.  Skin:  Positive for color change and wound. Negative for pallor and rash.  Allergic/Immunologic: Negative for environmental allergies.  Neurological:  Negative for dizziness, syncope and headaches.  Hematological:  Negative for adenopathy. Does not bruise/bleed easily.  Psychiatric/Behavioral:  Negative for decreased concentration and dysphoric mood. The patient is not nervous/anxious.        Objective:   Physical Exam Constitutional:      Appearance: Normal appearance. She is normal weight.  HENT:     Head: Normocephalic and atraumatic.  Eyes:     General:        Right eye: No discharge.        Left eye: No discharge.     Conjunctiva/sclera: Conjunctivae normal.     Pupils: Pupils are equal, round, and reactive to light.  Cardiovascular:     Rate and Rhythm: Normal rate and regular rhythm.     Heart sounds: Normal heart sounds.     Comments: No significant venous insuff signs on Lower extremities  Pulmonary:     Effort: Pulmonary effort is normal. No respiratory distress.  Musculoskeletal:     Cervical back: Neck supple.  Skin:    Findings: Erythema present. No bruising or rash.     Comments: Erythema of LLE below knee with hematoma (soft but not fluctuant) of 2-3 cm at center No open areas or drainage Some lentigines and sks  baseline  Mild swelling  Mild tenderness  Nl perfusion of foot and leg   Neurological:     Mental Status: She is alert.     Sensory: No sensory deficit.     Motor: No weakness.  Psychiatric:        Mood and Affect: Mood normal.             Assessment & Plan:   Problem List Items Addressed This Visit       Other   Cellulitis of left lower extremity - Primary    Reviewed notes from NP Konrad Dolores and obs last picture This began with blunt trauma (nl xray per pt at ortho) Has almost finished course of keflex with some mild improvement  Disc poss of mrsa (she has not had in past)   Will start bactrim DS bid  Elevation and change to warm compress for 10 minutes at a time  ER precautions noted  Inst to call if any inc in redness/ swelling/discomfort or any drainage F/u 1 wk for re check  If worse consider re check xr and /or ESR -watching for osteomyelitis Pt aware and will monitor closely   Meds ordered this encounter  Medications   sulfamethoxazole-trimethoprim (BACTRIM DS) 800-160 MG tablet    Sig: Take 1 tablet by mouth 2 (two) times daily. With food    Dispense:  14 tablet    Refill:  0

## 2022-07-19 NOTE — Assessment & Plan Note (Signed)
Reviewed notes from NP Konrad Dolores and obs last picture This began with blunt trauma (nl xray per pt at ortho) Has almost finished course of keflex with some mild improvement  Disc poss of mrsa (she has not had in past)   Will start bactrim DS bid  Elevation and change to warm compress for 10 minutes at a time  ER precautions noted  Inst to call if any inc in redness/ swelling/discomfort or any drainage F/u 1 wk for re check  If worse consider re check xr and /or ESR -watching for osteomyelitis Pt aware and will monitor closely   Meds ordered this encounter  Medications   sulfamethoxazole-trimethoprim (BACTRIM DS) 800-160 MG tablet    Sig: Take 1 tablet by mouth 2 (two) times daily. With food    Dispense:  14 tablet    Refill:  0

## 2022-07-26 ENCOUNTER — Encounter: Payer: Self-pay | Admitting: Family Medicine

## 2022-07-26 ENCOUNTER — Ambulatory Visit (INDEPENDENT_AMBULATORY_CARE_PROVIDER_SITE_OTHER): Payer: Medicare Other | Admitting: Family Medicine

## 2022-07-26 VITALS — BP 128/78 | HR 67 | Temp 97.8°F | Ht 63.75 in | Wt 146.1 lb

## 2022-07-26 DIAGNOSIS — R112 Nausea with vomiting, unspecified: Secondary | ICD-10-CM | POA: Diagnosis not present

## 2022-07-26 DIAGNOSIS — L03116 Cellulitis of left lower limb: Secondary | ICD-10-CM | POA: Diagnosis not present

## 2022-07-26 DIAGNOSIS — Z789 Other specified health status: Secondary | ICD-10-CM

## 2022-07-26 DIAGNOSIS — R197 Diarrhea, unspecified: Secondary | ICD-10-CM | POA: Diagnosis not present

## 2022-07-26 NOTE — Assessment & Plan Note (Signed)
Much improved after 4 d of bactrim (pt stopped due to poss GI side eff) Redness and swelling are better  Still a hematoma remains on the shin- this will take longer to improve Encouraged pt to protect from trauma  Monitor closely  Update if not starting to improve in a week or if worsening  ER precautions noted

## 2022-07-26 NOTE — Progress Notes (Signed)
Subjective:    Patient ID: Sarah Young, female    DOB: 01/01/1941, 82 y.o.   MRN: [id number]  HPI Pt presents for f/u of cellulitis of LLE  Wt Readings from Last 3 Encounters:  07/26/22 146 lb 2 oz (66.3 kg)  07/19/22 151 lb (68.5 kg)  07/13/22 153 lb 9.6 oz (69.7 kg)   25.28 kg/m  Vitals:   07/26/22 0920  BP: 128/78  Pulse: 67  Temp: 97.8 F (36.6 C)  SpO2: 98%     Last visit pt had some improvement with keflex but not 100% This had started after blunt trauma (no fx on xray)  We started bactrim to cover any possible MRSA   Started bactrim Monday  Developed n/v/d on Thursday - ? If side eff so stopped it   Could not eat at all and no appetite on Thursday  She had vomiting  Thursday and Friday  Also bad diarrhea   No fever  Had some body aches but not chills (? May be her baseline)  Leg looks much much better  Redness and swelling are down -starting Friday  A little tender, not painful however  Itches a little   Has not been around anyone sick  Really tired  No more diarrhea  No more vomiting  Was able to eat a little yesterday  Drinking fluids/ gatorade    Patient Active Problem List   Diagnosis Date Noted   Nausea vomiting and diarrhea 07/26/2022   Cellulitis of left lower extremity 07/13/2022   Recurrent epistaxis 05/27/2022   Herpes zoster 02/02/2021   Medicare annual wellness visit, subsequent 08/14/2020   Aortic atherosclerosis (HCC) 08/23/2019   Dizziness 07/11/2019   Deep vein thrombosis (DVT) of left lower extremity (HCC) 05/25/2019   Hypercoagulopathy (HCC) 05/22/2019   History of pulmonary embolism 05/07/2019   SOB (shortness of breath) on exertion 04/06/2019   Osteoporosis 05/02/2018   Routine general medical examination at a health care facility 11/21/2016   Estrogen deficiency 11/19/2016   Pedal edema 12/15/2015   Family history of breast cancer in female 05/17/2014   Malignant neoplasm of upper-outer quadrant of right  breast in female, estrogen receptor positive (HCC) 05/08/2014   GERD (gastroesophageal reflux disease) 03/01/2014   Lumbar degenerative disc disease 08/15/2013   Chronic pain of scapula 08/15/2013   Essential hypertension 10/02/2008   Migraine 09/06/2008   Vitamin D deficiency 06/27/2007   HYPERLIPIDEMIA 05/11/2007   Generalized anxiety disorder 05/11/2007   Disorder of bone and cartilage 05/11/2007   H/O poliomyelitis 05/08/2007   OSTEOARTHRITIS, HANDS, BILATERAL 05/08/2007   Fibromyalgia 05/08/2007   URINARY INCONTINENCE, STRESS, MILD 05/08/2007   Past Medical History:  Diagnosis Date   Anxiety    Arthritis    OA of hands   Breast cancer of upper-outer quadrant of right female breast (HCC) 05/08/2014   DVT (deep venous thrombosis) (HCC) 1980s   Fibromyalgia    Hyperlipidemia    Hypertension    Irritable bowel syndrome    Migraine    Osteopenia    Personal history of radiation therapy    Pulmonary embolus (HCC) 2021   S/P radiation therapy 06/2014   Right breast 4250 cGy in 17 sessions, right breast boost 1200 cGy in 6 sessions  = 07/11/2014 through 08/12/2014                         Vitamin D deficiency    Wears glasses    Past Surgical  History:  Procedure Laterality Date   ABDOMINAL HYSTERECTOMY  1972   APPENDECTOMY     BREAST EXCISIONAL BIOPSY Left    BREAST LUMPECTOMY Right 2016   BREAST SURGERY  1995   lumpectomy fibrocystic breast-lt   CATARACT EXTRACTION     both   CHOLECYSTECTOMY  2005   COLONOSCOPY     ovarian cyst removed  1985   RADIOACTIVE SEED GUIDED PARTIAL MASTECTOMY WITH AXILLARY SENTINEL LYMPH NODE BIOPSY Right 05/30/2014   Procedure: RADIOACTIVE SEED GUIDED PARTIAL MASTECTOMY WITH AXILLARY SENTINEL LYMPH NODE BIOPSY;  Surgeon: Chevis Pretty III, MD;  Location: [facility];  Service: General;  Laterality: Right;   Social History   Tobacco Use   Smoking status: Never   Smokeless tobacco: Never   Tobacco comments:    non smoker  Vaping  Use   Vaping Use: Never used  Substance Use Topics   Alcohol use: No    Alcohol/week: 0.0 standard drinks of alcohol   Drug use: No   Family History  Problem Relation Age of Onset   Hypertension Father    Cancer Father 88       esophageal - smoker   Leukemia Mother    Cancer Mother 57       leukemia   Cancer Brother 15       breast CA   Breast cancer Brother 31   Cancer Maternal Aunt 41       leukemia   Cancer Paternal Aunt 68       breast   Breast cancer Paternal Aunt 20   Breast cancer Maternal Grandmother 2   Breast cancer Sister 73   Allergies  Allergen Reactions   Amoxicillin Itching and Other (See Comments)     Paradoxical Reaction- made infection worse    Amoxicillin-Pot Clavulanate Other (See Comments)    Felt like body turned "inside out"   Bactrim [Sulfamethoxazole-Trimethoprim]     N/v/d       Esomeprazole Magnesium Nausea And Vomiting   Gabapentin Other (See Comments)    Caused nightmares   Omeprazole Nausea And Vomiting   Other Itching, Swelling and Other (See Comments)    Ant venom = Made the site (where bitten) swell   Ranitidine Hcl Other (See Comments)    "Not effective"   Wasp Venom Swelling and Other (See Comments)    Made leg swell where stung   Current Outpatient Medications on File Prior to Visit  Medication Sig Dispense Refill   acetaminophen (TYLENOL) 500 MG tablet Take 1,000 mg by mouth every 6 (six) hours as needed for mild pain (or headaches).     amitriptyline (ELAVIL) 10 MG tablet TAKE 2 TABLETS (20 MG TOTAL) BY MOUTH AT BEDTIME. 180 tablet 2   amLODipine (NORVASC) 5 MG tablet Take 1 tablet (5 mg total) by mouth daily. 90 tablet 2   Ascorbic Acid (VITAMIN C PO) Take 1 tablet by mouth every morning.     Calcium Carb-Cholecalciferol (CALCIUM + D3 PO) Take 1 tablet by mouth in the morning.     Cholecalciferol (VITAMIN D-3) 25 MCG (1000 UT) CAPS Take 1,000 Units by mouth daily.     Cyanocobalamin (VITAMIN B-12 PO) Take 1 tablet by mouth  every morning.     Multiple Vitamins-Minerals (ZINC PO) Take 1 tablet by mouth 2 (two) times a week.     Polyethyl Glycol-Propyl Glycol (SYSTANE) 0.4-0.3 % SOLN Place 1 drop into both eyes 2 (two) times daily.     rivaroxaban (  XARELTO) 20 MG TABS tablet TAKE 1 TABLET BY MOUTH DAILY WITH SUPPER. 90 tablet 3   SUMAtriptan (IMITREX) 100 MG tablet TAKE 1 TABLET BY MOUTH AS NEEDED FOR MIGRAINE, MAY REPEAT IN 2 HOURS IF NEEDED. MAX 2 TABS IN 24 HOURS. 9 tablet 11   No current facility-administered medications on file prior to visit.     Review of Systems  Constitutional:  Negative for activity change, appetite change, fatigue, fever and unexpected weight change.  HENT:  Negative for congestion, ear pain, rhinorrhea, sinus pressure and sore throat.   Eyes:  Negative for pain, redness and visual disturbance.  Respiratory:  Negative for cough, shortness of breath and wheezing.   Cardiovascular:  Negative for chest pain and palpitations.  Gastrointestinal:  Positive for diarrhea, nausea and vomiting. Negative for abdominal distention, abdominal pain, blood in stool and constipation.  Endocrine: Negative for polydipsia and polyuria.  Genitourinary:  Negative for dysuria, frequency and urgency.  Musculoskeletal:  Negative for arthralgias, back pain and myalgias.  Skin:  Negative for pallor and rash.       Improved LLE  Allergic/Immunologic: Negative for environmental allergies.  Neurological:  Negative for dizziness, syncope and headaches.  Hematological:  Negative for adenopathy. Does not bruise/bleed easily.  Psychiatric/Behavioral:  Negative for decreased concentration and dysphoric mood. The patient is not nervous/anxious.        Objective:   Physical Exam Constitutional:      General: She is not in acute distress.    Appearance: Normal appearance. She is well-developed and normal weight. She is not ill-appearing or diaphoretic.  HENT:     Head: Normocephalic and atraumatic.  Eyes:      Conjunctiva/sclera: Conjunctivae normal.     Pupils: Pupils are equal, round, and reactive to light.  Neck:     Thyroid: No thyromegaly.     Vascular: No carotid bruit or JVD.  Cardiovascular:     Rate and Rhythm: Normal rate and regular rhythm.     Heart sounds: Normal heart sounds.     No gallop.  Pulmonary:     Effort: Pulmonary effort is normal. No respiratory distress.     Breath sounds: Normal breath sounds. No wheezing or rales.  Abdominal:     General: Abdomen is flat. There is no distension or abdominal bruit.     Palpations: Abdomen is soft. There is no fluid wave, hepatomegaly, splenomegaly, mass or pulsatile mass.     Tenderness: There is no right CVA tenderness, left CVA tenderness, guarding or rebound. Negative signs include Murphy's sign and McBurney's sign.     Comments: Very slight tenderness to deep palpation in all areas   Musculoskeletal:     Cervical back: Normal range of motion and neck supple.     Right lower leg: No edema.     Left lower leg: No edema.  Lymphadenopathy:     Cervical: No cervical adenopathy.  Skin:    General: Skin is warm and dry.     Coloration: Skin is not pale.     Findings: No rash.     Comments: Much improved LLE  Hematoma remains - mildly tender ant lower shin ( mildly tender, not fluctuant) Erythema is almost gone  No swelling (besides hematoma)   Neurological:     Mental Status: She is alert.     Coordination: Coordination normal.     Deep Tendon Reflexes: Reflexes are normal and symmetric. Reflexes normal.  Psychiatric:        Mood  and Affect: Mood normal.           Assessment & Plan:   Problem List Items Addressed This Visit       Digestive   Nausea vomiting and diarrhea    This began on Thursday, 4 d into her bactrim course  Unsure if from that or viral  Did not have a fever  Reassuring exam today Much improved now but very tired   (no more diarrhea or vomiting) Disc imp of re hydration  Then slow advancement  of diet (BRAT) as tolerated Inst to call if any symptoms worsen  ER precautions noted   Update if not starting to improve in a week or if worsening          Other   Cellulitis of left lower extremity - Primary    Much improved after 4 d of bactrim (pt stopped due to poss GI side eff) Redness and swelling are better  Still a hematoma remains on the shin- this will take longer to improve Encouraged pt to protect from trauma  Monitor closely  Update if not starting to improve in a week or if worsening  ER precautions noted       Medication intolerance    Unsure if n/v/d was med intol to bactrim or if this was viral  Is improving off med Added to contraindication list   Will continue to monitor

## 2022-07-26 NOTE — Assessment & Plan Note (Signed)
Unsure if n/v/d was med intol to bactrim or if this was viral  Is improving off med Added to contraindication list   Will continue to monitor

## 2022-07-26 NOTE — Patient Instructions (Signed)
The leg looks much better It will take a while for the lump to go down   If this looks or feels worse please call   I'm not sure if the GI symptoms were from the antibiotic or a virus  Drink fluids (sip, go slowly)   For food- small amounts of bland food to start  BRAT (bananas, rice, apple sauce and toast) Crackers are ok   Go slow Update if not starting to improve in a week or if worsening

## 2022-07-26 NOTE — Assessment & Plan Note (Signed)
This began on Thursday, 4 d into her bactrim course  Unsure if from that or viral  Did not have a fever  Reassuring exam today Much improved now but very tired   (no more diarrhea or vomiting) Disc imp of re hydration  Then slow advancement of diet (BRAT) as tolerated Inst to call if any symptoms worsen  ER precautions noted   Update if not starting to improve in a week or if worsening

## 2022-08-02 ENCOUNTER — Telehealth: Payer: Self-pay | Admitting: Family Medicine

## 2022-08-02 MED ORDER — CLINDAMYCIN HCL 300 MG PO CAPS: 300.0000 mg | ORAL_CAPSULE | Freq: Three times a day (TID) | ORAL | 0 refills | Status: DC

## 2022-08-02 NOTE — Telephone Encounter (Signed)
Pt notified Rx sent to pharmacy and advised of Dr. Royden Purl comments and ER precautions

## 2022-08-02 NOTE — Telephone Encounter (Signed)
Pt said food is red and swollen. She has no other sxs. No fever/ chills. Just redness and swelling, and redness. Appt scheduled with PCP tomorrow at 8:30 and pt isn't sure if she has taken clindamycin before but she said if she has had a reaction to it, it would be in our chart

## 2022-08-02 NOTE — Telephone Encounter (Signed)
I sent clindamycin to her pharmacy  Will see her tomorrow If severe overnight- go to the ER  Thanks

## 2022-08-02 NOTE — Addendum Note (Signed)
Addended by: Roxy Manns A on: 08/02/2022 04:35 PM   Modules accepted: Orders

## 2022-08-02 NOTE — Telephone Encounter (Signed)
Patient called in and stated that she was seen last week for cellulitis. She stated that her foot is swollen back up and red. She was told to call back in when this happen. Please advise. Thank you!

## 2022-08-02 NOTE — Telephone Encounter (Signed)
Please get more details  She was getting better at last visit  ? Fever or other symptoms ? Please schedule in office visit with first available   She took 4 d of bactrim and quit due to n/v  Has she ever had clindamycin before? We may need to start something new

## 2022-08-03 ENCOUNTER — Encounter: Payer: Self-pay | Admitting: Family Medicine

## 2022-08-03 ENCOUNTER — Ambulatory Visit (INDEPENDENT_AMBULATORY_CARE_PROVIDER_SITE_OTHER): Payer: Medicare Other | Admitting: Family Medicine

## 2022-08-03 VITALS — BP 122/68 | HR 64 | Temp 97.6°F | Ht 63.75 in | Wt 150.0 lb

## 2022-08-03 DIAGNOSIS — L03116 Cellulitis of left lower limb: Secondary | ICD-10-CM | POA: Diagnosis not present

## 2022-08-03 NOTE — Patient Instructions (Signed)
You may have a swollen ankle for a while as this hematoma resolves Support sock to the knee may help   Elevate foot when you can   Go ahead and take the clindamycin for 5 days  Watch for redness / swelling and pain , or fever  Let us know

## 2022-08-03 NOTE — Assessment & Plan Note (Addendum)
Pt was very worried this was worse after stopping the bactrim (which she thought caused n/v/d but turns out it was a virus since family go it also) More swelling after first day back at work (may be due to dependent status) Redness is to me improved/pt thought it was worse yesterday Will continue clindamycin 300 mg tid  for 5 d (instead of 7) and alert [place] if side effects I think the ankle will continue to swell when dependent , so support socks to the knee are recommended  She will continue to elevate when able  See AVS for callback parameters  Also ER precautions noted  Update if not starting to improve in a week or if worsening

## 2022-08-03 NOTE — Progress Notes (Signed)
Subjective:    Patient ID: Sarah Young, female    DOB: [birth year], 82 y.o.   MRN: [id number]  HPI Pt presents for re check of cellulitis  Wt Readings from Last 3 Encounters:  08/03/22 150 lb (68 kg)  07/26/22 146 lb 2 oz (66.3 kg)  07/19/22 151 lb (68.5 kg)   25.95 kg/m  Vitals:   08/03/22 0820  BP: 122/68  Pulse: 64  Temp: 97.6 F (36.4 C)  SpO2: 97%   Cellulitis of LLE She had improvement with bactrim (after keflex) Took bactrim 4 d and stopped due to GI side effects  Now thinks she had a virus and it was not the bactrim  Other family members got it also  Is better now  Made her very weak     Called yesterday-symptoms worse Some swelling in her ankle yesterday (went back to work for the first time)  Some red spots  Not a lot of soreness  We px clindamycin-started it last night   Patient Active Problem List   Diagnosis Date Noted   Nausea vomiting and diarrhea 07/26/2022   Medication intolerance 07/26/2022   Cellulitis of left lower extremity 07/13/2022   Recurrent epistaxis 05/27/2022   Herpes zoster 02/02/2021   Medicare annual wellness visit, subsequent 08/14/2020   Aortic atherosclerosis (HCC) 08/23/2019   Dizziness 07/11/2019   Deep vein thrombosis (DVT) of left lower extremity (HCC) 05/25/2019   Hypercoagulopathy (HCC) 05/22/2019   History of pulmonary embolism 05/07/2019   SOB (shortness of breath) on exertion 04/06/2019   Osteoporosis 05/02/2018   Routine general medical examination at a health care facility 11/21/2016   Estrogen deficiency 11/19/2016   Pedal edema 12/15/2015   Family history of breast cancer in female 05/17/2014   Malignant neoplasm of upper-outer quadrant of right breast in female, estrogen receptor positive (HCC) 05/08/2014   GERD (gastroesophageal reflux disease) 03/01/2014   Lumbar degenerative disc disease 08/15/2013   Chronic pain of scapula 08/15/2013   Essential hypertension 10/02/2008   Migraine 09/06/2008    Vitamin D deficiency 06/27/2007   HYPERLIPIDEMIA 05/11/2007   Generalized anxiety disorder 05/11/2007   Disorder of bone and cartilage 05/11/2007   H/O poliomyelitis 05/08/2007   OSTEOARTHRITIS, HANDS, BILATERAL 05/08/2007   Fibromyalgia 05/08/2007   URINARY INCONTINENCE, STRESS, MILD 05/08/2007   Past Medical History:  Diagnosis Date   Anxiety    Arthritis    OA of hands   Breast cancer of upper-outer quadrant of right female breast (HCC) 05/08/2014   DVT (deep venous thrombosis) (HCC) 1980s   Fibromyalgia    Hyperlipidemia    Hypertension    Irritable bowel syndrome    Migraine    Osteopenia    Personal history of radiation therapy    Pulmonary embolus (HCC) 2021   S/P radiation therapy 06/2014   Right breast 4250 cGy in 17 sessions, right breast boost 1200 cGy in 6 sessions  = 07/11/2014 through 08/12/2014                         Vitamin D deficiency    Wears glasses    Past Surgical History:  Procedure Laterality Date   ABDOMINAL HYSTERECTOMY  1972   APPENDECTOMY     BREAST EXCISIONAL BIOPSY Left    BREAST LUMPECTOMY Right 2016   BREAST SURGERY  1995   lumpectomy fibrocystic breast-lt   CATARACT EXTRACTION     both   CHOLECYSTECTOMY  2005   COLONOSCOPY  ovarian cyst removed  1985   RADIOACTIVE SEED GUIDED PARTIAL MASTECTOMY WITH AXILLARY SENTINEL LYMPH NODE BIOPSY Right 05/30/2014   Procedure: RADIOACTIVE SEED GUIDED PARTIAL MASTECTOMY WITH AXILLARY SENTINEL LYMPH NODE BIOPSY;  Surgeon: Chevis Pretty III, MD;  Location: [facility];  Service: General;  Laterality: Right;   Social History   Tobacco Use   Smoking status: Never   Smokeless tobacco: Never   Tobacco comments:    non smoker  Vaping Use   Vaping Use: Never used  Substance Use Topics   Alcohol use: No    Alcohol/week: 0.0 standard drinks of alcohol   Drug use: No   Family History  Problem Relation Age of Onset   Hypertension Father    Cancer Father 62       esophageal - smoker    Leukemia Mother    Cancer Mother 52       leukemia   Cancer Brother 32       breast CA   Breast cancer Brother 20   Cancer Maternal Aunt 75       leukemia   Cancer Paternal Aunt 22       breast   Breast cancer Paternal Aunt 60   Breast cancer Maternal Grandmother 19   Breast cancer Sister 60   Allergies  Allergen Reactions   Amoxicillin Itching and Other (See Comments)     Paradoxical Reaction- made infection worse    Amoxicillin-Pot Clavulanate Other (See Comments)    Felt like body turned "inside out"   Bactrim [Sulfamethoxazole-Trimethoprim]     N/v/d       Esomeprazole Magnesium Nausea And Vomiting   Gabapentin Other (See Comments)    Caused nightmares   Omeprazole Nausea And Vomiting   Other Itching, Swelling and Other (See Comments)    Ant venom = Made the site (where bitten) swell   Ranitidine Hcl Other (See Comments)    "Not effective"   Wasp Venom Swelling and Other (See Comments)    Made leg swell where stung   Current Outpatient Medications on File Prior to Visit  Medication Sig Dispense Refill   acetaminophen (TYLENOL) 500 MG tablet Take 1,000 mg by mouth every 6 (six) hours as needed for mild pain (or headaches).     amitriptyline (ELAVIL) 10 MG tablet TAKE 2 TABLETS (20 MG TOTAL) BY MOUTH AT BEDTIME. 180 tablet 2   amLODipine (NORVASC) 5 MG tablet Take 1 tablet (5 mg total) by mouth daily. 90 tablet 2   Ascorbic Acid (VITAMIN C PO) Take 1 tablet by mouth every morning.     Calcium Carb-Cholecalciferol (CALCIUM + D3 PO) Take 1 tablet by mouth in the morning.     Cholecalciferol (VITAMIN D-3) 25 MCG (1000 UT) CAPS Take 1,000 Units by mouth daily.     clindamycin (CLEOCIN) 300 MG capsule Take 1 capsule (300 mg total) by mouth 3 (three) times daily. 21 capsule 0   Cyanocobalamin (VITAMIN B-12 PO) Take 1 tablet by mouth every morning.     Multiple Vitamins-Minerals (ZINC PO) Take 1 tablet by mouth 2 (two) times a week.     Polyethyl Glycol-Propyl Glycol  (SYSTANE) 0.4-0.3 % SOLN Place 1 drop into both eyes 2 (two) times daily.     rivaroxaban (XARELTO) 20 MG TABS tablet TAKE 1 TABLET BY MOUTH DAILY WITH SUPPER. 90 tablet 3   SUMAtriptan (IMITREX) 100 MG tablet TAKE 1 TABLET BY MOUTH AS NEEDED FOR MIGRAINE, MAY REPEAT IN 2 HOURS IF NEEDED.  MAX 2 TABS IN 24 HOURS. 9 tablet 11   No current facility-administered medications on file prior to visit.     Review of Systems  Constitutional:  Negative for activity change, appetite change, fatigue, fever and unexpected weight change.  HENT:  Negative for congestion, ear pain, rhinorrhea, sinus pressure and sore throat.   Eyes:  Negative for pain, redness and visual disturbance.  Respiratory:  Negative for cough, shortness of breath and wheezing.   Cardiovascular:  Positive for leg swelling. Negative for chest pain and palpitations.  Gastrointestinal:  Negative for abdominal pain, blood in stool, constipation and diarrhea.  Endocrine: Negative for polydipsia and polyuria.  Genitourinary:  Negative for dysuria, frequency and urgency.  Musculoskeletal:  Negative for arthralgias, back pain and myalgias.  Skin:  Positive for color change. Negative for pallor and rash.  Allergic/Immunologic: Negative for environmental allergies.  Neurological:  Negative for dizziness, syncope and headaches.  Hematological:  Negative for adenopathy. Does not bruise/bleed easily.  Psychiatric/Behavioral:  Negative for decreased concentration and dysphoric mood. The patient is not nervous/anxious.        Objective:   Physical Exam Constitutional:      General: She is not in acute distress.    Appearance: Normal appearance. She is normal weight. She is not ill-appearing.  Cardiovascular:     Rate and Rhythm: Normal rate and regular rhythm.     Pulses: Normal pulses.  Pulmonary:     Effort: Pulmonary effort is normal. No respiratory distress.  Musculoskeletal:     Left lower leg: Edema present.     Comments: Mild  swelling in L ankle  No ecchymosis   Skin:    Comments: LLE looks similar to last time Scant erythema , still improved  Hematoma noted   Some mild swelling in ankle     Neurological:     Mental Status: She is alert.     Sensory: No sensory deficit.     Motor: No weakness.  Psychiatric:        Mood and Affect: Mood normal.           Assessment & Plan:   Problem List Items Addressed This Visit       Other   Cellulitis of left lower extremity - Primary    Pt was very worried this was worse after stopping the bactrim (which she thought caused n/v/d but turns out it was a virus since family go it also) More swelling after first day back at work (may be due to dependent status) Redness is to me improved/pt thought it was worse yesterday Will continue clindamycin 300 mg tid  for 5 d (instead of 7) and alert [place] if side effects I think the ankle will continue to swell when dependent , so support socks to the knee are recommended  She will continue to elevate when able  See AVS for callback parameters  Also ER precautions noted  Update if not starting to improve in a week or if worsening

## 2022-09-06 DIAGNOSIS — H35363 Drusen (degenerative) of macula, bilateral: Secondary | ICD-10-CM | POA: Diagnosis not present

## 2022-09-06 DIAGNOSIS — H35373 Puckering of macula, bilateral: Secondary | ICD-10-CM | POA: Diagnosis not present

## 2022-09-06 DIAGNOSIS — D3131 Benign neoplasm of right choroid: Secondary | ICD-10-CM | POA: Diagnosis not present

## 2022-09-06 DIAGNOSIS — H35013 Changes in retinal vascular appearance, bilateral: Secondary | ICD-10-CM | POA: Diagnosis not present

## 2022-10-18 ENCOUNTER — Other Ambulatory Visit: Payer: Self-pay | Admitting: Family Medicine

## 2022-10-18 DIAGNOSIS — D1801 Hemangioma of skin and subcutaneous tissue: Secondary | ICD-10-CM | POA: Diagnosis not present

## 2022-10-18 DIAGNOSIS — D492 Neoplasm of unspecified behavior of bone, soft tissue, and skin: Secondary | ICD-10-CM | POA: Diagnosis not present

## 2022-10-18 DIAGNOSIS — L570 Actinic keratosis: Secondary | ICD-10-CM | POA: Diagnosis not present

## 2022-10-18 DIAGNOSIS — L821 Other seborrheic keratosis: Secondary | ICD-10-CM | POA: Diagnosis not present

## 2022-10-18 DIAGNOSIS — Z1231 Encounter for screening mammogram for malignant neoplasm of breast: Secondary | ICD-10-CM

## 2022-10-18 DIAGNOSIS — D225 Melanocytic nevi of trunk: Secondary | ICD-10-CM | POA: Diagnosis not present

## 2022-10-18 DIAGNOSIS — L814 Other melanin hyperpigmentation: Secondary | ICD-10-CM | POA: Diagnosis not present

## 2022-10-20 ENCOUNTER — Encounter: Payer: Self-pay | Admitting: Specialist

## 2022-10-22 ENCOUNTER — Telehealth: Payer: Self-pay

## 2022-10-22 NOTE — Patient Outreach (Signed)
  Care Coordination   10/22/2022 Name: Sarah Young MRN: [id number] DOB: 1940/11/26   Care Coordination Outreach Attempts:  An unsuccessful telephone outreach was attempted today to offer the patient information about available care coordination services. HIPAA compliant message left.   Follow Up Plan:  Additional outreach attempts will be made to offer the patient care coordination information and services.   Encounter Outcome:  No Answer   Care Coordination Interventions:  No, not indicated    George Ina [facility] Care Coordination [phone] direct line

## 2022-10-27 ENCOUNTER — Telehealth: Payer: Self-pay

## 2022-10-27 NOTE — Patient Outreach (Signed)
  Care Coordination   10/27/2022 Name: Sarah Young MRN: [id number] DOB: 05-14-1940   Care Coordination Outreach Attempts:  A second unsuccessful outreach was attempted today to offer the patient with information about available care coordination services.  Follow Up Plan:  Additional outreach attempts will be made to offer the patient care coordination information and services.   Encounter Outcome:  No Answer   Care Coordination Interventions:  No, not indicated    George Ina [facility] Care Coordination [phone] direct line

## 2022-11-09 ENCOUNTER — Encounter: Payer: Self-pay | Admitting: Family Medicine

## 2022-11-09 ENCOUNTER — Ambulatory Visit (INDEPENDENT_AMBULATORY_CARE_PROVIDER_SITE_OTHER): Payer: Medicare Other | Admitting: Family Medicine

## 2022-11-09 VITALS — BP 118/68 | HR 68 | Temp 97.8°F | Ht 63.75 in | Wt 151.2 lb

## 2022-11-09 DIAGNOSIS — K644 Residual hemorrhoidal skin tags: Secondary | ICD-10-CM | POA: Diagnosis not present

## 2022-11-09 MED ORDER — HYDROCORTISONE (PERIANAL) 2.5 % EX CREA: 1.0000 | TOPICAL_CREAM | Freq: Two times a day (BID) | CUTANEOUS | 0 refills | Status: AC

## 2022-11-09 NOTE — Patient Instructions (Addendum)
Keep area gently clean with soap and water  Cool compress helps  Try not to strain / keep stools soft  Cool compress may help  Keep using a donut pillow for discomfort   Try the anusol hc cream - twice daily (maximum 10 days)  Update if not starting to improve in a week or if worsening

## 2022-11-09 NOTE — Assessment & Plan Note (Signed)
Pt has history of both internal and external hemorrhoids in past  Today a non thrombosed but large hemorrhoid /tag  Thinks this began with outdoor work/straining/lifting and has improved with home care and prep H  Discussed use of gentle soap/water/ cold compress and donut pillow Advised not to strain  Anusol hc cream prescription to use bid for 10 d max Update if not starting to improve in a week or if worsening  Call back and Er precautions noted in detail today

## 2022-11-09 NOTE — Progress Notes (Signed)
Subjective:    Patient ID: Sarah Young, female    DOB: 05-22-1940, 82 y.o.   MRN: [id number]  HPI  Wt Readings from Last 3 Encounters:  11/09/22 151 lb 4 oz (68.6 kg)  08/03/22 150 lb (68 kg)  07/26/22 146 lb 2 oz (66.3 kg)   26.17 kg/m  Vitals:   11/09/22 1216  BP: 118/68  Pulse: 68  Temp: 97.8 F (36.6 C)  SpO2: 99%   Pt presents with c/o hemorrhoids   Hemorrhoid issues  Got worse with yard work and lifting   She can feel with her hand when wiping  Burning sensation  A little improved today  Scant brb when wiping   Irritation more than  pain  More so when having bm   Has a donut pillow  Using prep H and it burns  Also vaseline    No straining  Bowels are loose     Dr Terri Piedra (surgeon) used to treat her outpatient for thrombosed hemorrhoids  Never had a hemorrhoid surgery    Colonoscopy 2009 Internal hemorrhoids noted    Patient Active Problem List   Diagnosis Date Noted   Hemorrhoids, external 11/09/2022   Nausea vomiting and diarrhea 07/26/2022   Medication intolerance 07/26/2022   Cellulitis of left lower extremity 07/13/2022   Recurrent epistaxis 05/27/2022   Herpes zoster 02/02/2021   Medicare annual wellness visit, subsequent 08/14/2020   Aortic atherosclerosis (HCC) 08/23/2019   Dizziness 07/11/2019   Deep vein thrombosis (DVT) of left lower extremity (HCC) 05/25/2019   Hypercoagulopathy (HCC) 05/22/2019   History of pulmonary embolism 05/07/2019   SOB (shortness of breath) on exertion 04/06/2019   Osteoporosis 05/02/2018   Routine general medical examination at a health care facility 11/21/2016   Estrogen deficiency 11/19/2016   Pedal edema 12/15/2015   Family history of breast cancer in female 05/17/2014   Malignant neoplasm of upper-outer quadrant of right breast in female, estrogen receptor positive (HCC) 05/08/2014   GERD (gastroesophageal reflux disease) 03/01/2014   Lumbar degenerative disc disease 08/15/2013    Chronic pain of scapula 08/15/2013   Essential hypertension 10/02/2008   Migraine 09/06/2008   Vitamin D deficiency 06/27/2007   HYPERLIPIDEMIA 05/11/2007   Generalized anxiety disorder 05/11/2007   Disorder of bone and cartilage 05/11/2007   H/O poliomyelitis 05/08/2007   OSTEOARTHRITIS, HANDS, BILATERAL 05/08/2007   Fibromyalgia 05/08/2007   URINARY INCONTINENCE, STRESS, MILD 05/08/2007   Past Medical History:  Diagnosis Date   Anxiety    Arthritis    OA of hands   Breast cancer of upper-outer quadrant of right female breast (HCC) 05/08/2014   DVT (deep venous thrombosis) (HCC) 1980s   Fibromyalgia    Hyperlipidemia    Hypertension    Irritable bowel syndrome    Migraine    Osteopenia    Personal history of radiation therapy    Pulmonary embolus (HCC) 2021   S/P radiation therapy 06/2014   Right breast 4250 cGy in 17 sessions, right breast boost 1200 cGy in 6 sessions  = 07/11/2014 through 08/12/2014                         Vitamin D deficiency    Wears glasses    Past Surgical History:  Procedure Laterality Date   ABDOMINAL HYSTERECTOMY  1972   APPENDECTOMY     BREAST EXCISIONAL BIOPSY Left    BREAST LUMPECTOMY Right 2016   BREAST SURGERY  1995   lumpectomy  fibrocystic breast-lt   CATARACT EXTRACTION     both   CHOLECYSTECTOMY  2005   COLONOSCOPY     ovarian cyst removed  1985   RADIOACTIVE SEED GUIDED PARTIAL MASTECTOMY WITH AXILLARY SENTINEL LYMPH NODE BIOPSY Right 05/30/2014   Procedure: RADIOACTIVE SEED GUIDED PARTIAL MASTECTOMY WITH AXILLARY SENTINEL LYMPH NODE BIOPSY;  Surgeon: Chevis Pretty III, MD;  Location: [facility];  Service: General;  Laterality: Right;   Social History   Tobacco Use   Smoking status: Never   Smokeless tobacco: Never   Tobacco comments:    non smoker  Vaping Use   Vaping status: Never Used  Substance Use Topics   Alcohol use: No    Alcohol/week: 0.0 standard drinks of alcohol   Drug use: No   Family History   Problem Relation Age of Onset   Hypertension Father    Cancer Father 46       esophageal - smoker   Leukemia Mother    Cancer Mother 89       leukemia   Cancer Brother 58       breast CA   Breast cancer Brother 55   Cancer Maternal Aunt 18       leukemia   Cancer Paternal Aunt 57       breast   Breast cancer Paternal Aunt 73   Breast cancer Maternal Grandmother 44   Breast cancer Sister 14   Allergies  Allergen Reactions   Amoxicillin Itching and Other (See Comments)     Paradoxical Reaction- made infection worse    Amoxicillin-Pot Clavulanate Other (See Comments)    Felt like body turned "inside out"   Esomeprazole Magnesium Nausea And Vomiting   Gabapentin Other (See Comments)    Caused nightmares   Omeprazole Nausea And Vomiting   Other Itching, Swelling and Other (See Comments)    Ant venom = Made the site (where bitten) swell   Ranitidine Hcl Other (See Comments)    "Not effective"   Wasp Venom Swelling and Other (See Comments)    Made leg swell where stung   Current Outpatient Medications on File Prior to Visit  Medication Sig Dispense Refill   acetaminophen (TYLENOL) 500 MG tablet Take 1,000 mg by mouth every 6 (six) hours as needed for mild pain (or headaches).     amitriptyline (ELAVIL) 10 MG tablet TAKE 2 TABLETS (20 MG TOTAL) BY MOUTH AT BEDTIME. 180 tablet 2   amLODipine (NORVASC) 5 MG tablet Take 1 tablet (5 mg total) by mouth daily. 90 tablet 2   Ascorbic Acid (VITAMIN C PO) Take 1 tablet by mouth every morning.     Calcium Carb-Cholecalciferol (CALCIUM + D3 PO) Take 1 tablet by mouth in the morning.     Cholecalciferol (VITAMIN D-3) 25 MCG (1000 UT) CAPS Take 1,000 Units by mouth daily.     clindamycin (CLEOCIN) 300 MG capsule Take 1 capsule (300 mg total) by mouth 3 (three) times daily. 21 capsule 0   Cyanocobalamin (VITAMIN B-12 PO) Take 1 tablet by mouth every morning.     Multiple Vitamins-Minerals (ZINC PO) Take 1 tablet by mouth 2 (two) times a  week.     Polyethyl Glycol-Propyl Glycol (SYSTANE) 0.4-0.3 % SOLN Place 1 drop into both eyes 2 (two) times daily.     rivaroxaban (XARELTO) 20 MG TABS tablet TAKE 1 TABLET BY MOUTH DAILY WITH SUPPER. 90 tablet 3   SUMAtriptan (IMITREX) 100 MG tablet TAKE 1 TABLET BY MOUTH AS  NEEDED FOR MIGRAINE, MAY REPEAT IN 2 HOURS IF NEEDED. MAX 2 TABS IN 24 HOURS. 9 tablet 11   No current facility-administered medications on file prior to visit.    Review of Systems  Constitutional:  Negative for activity change, appetite change, fatigue, fever and unexpected weight change.  HENT:  Negative for congestion, rhinorrhea, sore throat and trouble swallowing.   Eyes:  Negative for pain, redness, itching and visual disturbance.  Respiratory:  Negative for cough, chest tightness, shortness of breath and wheezing.   Cardiovascular:  Negative for chest pain and palpitations.  Gastrointestinal:  Positive for blood in stool and rectal pain. Negative for abdominal distention, abdominal pain, constipation, diarrhea and nausea.  Endocrine: Negative for cold intolerance, heat intolerance, polydipsia and polyuria.  Genitourinary:  Negative for difficulty urinating, dysuria, frequency and urgency.  Musculoskeletal:  Negative for arthralgias, joint swelling and myalgias.  Skin:  Negative for pallor and rash.  Neurological:  Negative for dizziness, tremors, weakness, numbness and headaches.  Hematological:  Negative for adenopathy. Does not bruise/bleed easily.  Psychiatric/Behavioral:  Negative for decreased concentration and dysphoric mood. The patient is not nervous/anxious.        Objective:   Physical Exam Exam conducted with a chaperone present.  Constitutional:      General: She is not in acute distress.    Appearance: Normal appearance. She is normal weight. She is not ill-appearing or diaphoretic.  Eyes:     General: No scleral icterus.    Conjunctiva/sclera: Conjunctivae normal.     Pupils: Pupils are  equal, round, and reactive to light.  Cardiovascular:     Rate and Rhythm: Normal rate and regular rhythm.  Abdominal:     General: Abdomen is flat. Bowel sounds are normal. There is no distension.     Palpations: There is no mass.     Tenderness: There is no abdominal tenderness.     Hernia: No hernia is present.  Genitourinary:    Exam position: Knee-chest position.     Rectum: External hemorrhoid present. No mass or anal fissure. Normal anal tone.     Comments: External hemorrhoid at approx 3:00  3-4 mm /not thrombosed  Not bleeding  Minimally tender   Few smaller hemorrhoid scattered   Rectal area is mildly erythematous    Musculoskeletal:     Cervical back: Neck supple.  Neurological:     Mental Status: She is alert.           Assessment & Plan:   Problem List Items Addressed This Visit       Cardiovascular and Mediastinum   Hemorrhoids, external - Primary    Pt has history of both internal and external hemorrhoids in past  Today a non thrombosed but large hemorrhoid /tag  Thinks this began with outdoor work/straining/lifting and has improved with home care and prep H  Discussed use of gentle soap/water/ cold compress and donut pillow Advised not to strain  Anusol hc cream prescription to use bid for 10 d max Update if not starting to improve in a week or if worsening  Call back and Er precautions noted in detail today

## 2022-11-18 ENCOUNTER — Ambulatory Visit
Admission: RE | Admit: 2022-11-18 | Discharge: 2022-11-18 | Disposition: A | Discharge status: Home / Self Care | Payer: Medicare Other | Source: Ambulatory Visit | Attending: Family Medicine | Admitting: Family Medicine

## 2022-11-18 DIAGNOSIS — Z1231 Encounter for screening mammogram for malignant neoplasm of breast: Secondary | ICD-10-CM

## 2022-11-22 ENCOUNTER — Other Ambulatory Visit: Payer: Self-pay | Admitting: Family Medicine

## 2022-11-22 ENCOUNTER — Encounter: Payer: Self-pay | Admitting: Family Medicine

## 2022-11-22 DIAGNOSIS — R928 Other abnormal and inconclusive findings on diagnostic imaging of breast: Secondary | ICD-10-CM

## 2022-12-16 ENCOUNTER — Ambulatory Visit
Admission: RE | Admit: 2022-12-16 | Discharge: 2022-12-16 | Disposition: A | Discharge status: Home / Self Care | Payer: Medicare Other | Source: Ambulatory Visit | Attending: Family Medicine | Admitting: Family Medicine

## 2022-12-16 ENCOUNTER — Ambulatory Visit: Payer: Medicare Other

## 2022-12-16 DIAGNOSIS — R928 Other abnormal and inconclusive findings on diagnostic imaging of breast: Secondary | ICD-10-CM | POA: Diagnosis not present

## 2022-12-16 DIAGNOSIS — N6310 Unspecified lump in the right breast, unspecified quadrant: Secondary | ICD-10-CM | POA: Diagnosis not present

## 2023-01-05 DIAGNOSIS — L218 Other seborrheic dermatitis: Secondary | ICD-10-CM | POA: Diagnosis not present

## 2023-01-24 ENCOUNTER — Ambulatory Visit (INDEPENDENT_AMBULATORY_CARE_PROVIDER_SITE_OTHER): Payer: Medicare Other

## 2023-01-24 VITALS — Ht 64.0 in | Wt 150.0 lb

## 2023-01-24 DIAGNOSIS — Z0000 Encounter for general adult medical examination without abnormal findings: Secondary | ICD-10-CM | POA: Diagnosis not present

## 2023-01-24 NOTE — Progress Notes (Signed)
Subjective:   [name] is a 82 y.o. female who presents for Medicare Annual (Subsequent) preventive examination.  Visit Complete: Virtual I connected with  Sarah Young on 01/24/23 by a audio enabled telemedicine application and verified that I am speaking with the correct person using two identifiers.  Patient Location: Home  Provider Location: Home Office  I discussed the limitations of evaluation and management by telemedicine. The patient expressed understanding and agreed to proceed.  Vital Signs: Because this visit was a virtual/telehealth visit, some criteria may be missing or patient reported. Any vitals not documented were not able to be obtained and vitals that have been documented are patient reported.  Patient Medicare AWV questionnaire was completed by the patient on 01/24/2023; I have confirmed that all information answered by patient is correct and no changes since this date.  Cardiac Risk Factors include: advanced age (>57men, >20 women);dyslipidemia     Objective:    Today's Vitals   01/24/23 1138  Weight: 150 lb (68 kg)  Height: 5\' 4"  (1.626 m)   Body mass index is 25.75 kg/m.     01/24/2023   11:43 AM 08/18/2021    8:34 AM 01/28/2021    8:53 AM 06/09/2020   10:15 AM 06/07/2020   10:44 AM 07/27/2019   10:33 AM 01/04/2017    3:14 PM  Advanced Directives  Does Patient Have a Medical Advance Directive? Yes Yes No No Yes Yes Yes  Type of Estate agent of [place];Living will Healthcare Power of [place]   Living will Healthcare Power of [place];Living will Healthcare Power of [place];Living will  Copy of Healthcare Power of Attorney in Chart? No - copy requested No - copy requested    No - copy requested No - copy requested  Would patient like information on creating a medical advance directive?   No - Patient declined    No - Patient declined    Current Medications (verified) Outpatient Encounter Medications as  of 01/24/2023  Medication Sig   acetaminophen (TYLENOL) 500 MG tablet Take 1,000 mg by mouth every 6 (six) hours as needed for mild pain (or headaches).   amitriptyline (ELAVIL) 10 MG tablet TAKE 2 TABLETS (20 MG TOTAL) BY MOUTH AT BEDTIME.   amLODipine (NORVASC) 5 MG tablet TAKE 1 TABLET (5 MG TOTAL) BY MOUTH DAILY.   Ascorbic Acid (VITAMIN C PO) Take 1 tablet by mouth every morning.   Calcium Carb-Cholecalciferol (CALCIUM + D3 PO) Take 1 tablet by mouth in the morning.   Cholecalciferol (VITAMIN D-3) 25 MCG (1000 UT) CAPS Take 1,000 Units by mouth daily.   clindamycin (CLEOCIN) 300 MG capsule Take 1 capsule (300 mg total) by mouth 3 (three) times daily.   Cyanocobalamin (VITAMIN B-12 PO) Take 1 tablet by mouth every morning.   hydrocortisone (ANUSOL-HC) 2.5 % rectal cream Place 1 Application rectally 2 (two) times daily. To affected area/hemorrhoids for 10 days maximum   Multiple Vitamins-Minerals (ZINC PO) Take 1 tablet by mouth 2 (two) times a week.   Polyethyl Glycol-Propyl Glycol (SYSTANE) 0.4-0.3 % SOLN Place 1 drop into both eyes 2 (two) times daily.   rivaroxaban (XARELTO) 20 MG TABS tablet TAKE 1 TABLET BY MOUTH DAILY WITH SUPPER.   SUMAtriptan (IMITREX) 100 MG tablet TAKE 1 TABLET BY MOUTH AS NEEDED FOR MIGRAINE, MAY REPEAT IN 2 HOURS IF NEEDED. MAX 2 TABS IN 24 HOURS.   No facility-administered encounter medications on file as of 01/24/2023.    Allergies (verified) Amoxicillin,  Amoxicillin-pot clavulanate, Esomeprazole magnesium, Gabapentin, Omeprazole, Other, Ranitidine hcl, and Wasp venom   History: Past Medical History:  Diagnosis Date   Anxiety    Arthritis    OA of hands   Breast cancer of upper-outer quadrant of right female breast (HCC) 05/08/2014   DVT (deep venous thrombosis) (HCC) 1980s   Fibromyalgia    Hyperlipidemia    Hypertension    Irritable bowel syndrome    Migraine    Osteopenia    Personal history of radiation therapy    Pulmonary embolus (HCC) 2021    S/P radiation therapy 06/2014   Right breast 4250 cGy in 17 sessions, right breast boost 1200 cGy in 6 sessions  = 07/11/2014 through 08/12/2014                         Vitamin D deficiency    Wears glasses    Past Surgical History:  Procedure Laterality Date   ABDOMINAL HYSTERECTOMY  1972   APPENDECTOMY     BREAST EXCISIONAL BIOPSY Left    BREAST LUMPECTOMY Right 2016   BREAST SURGERY  1995   lumpectomy fibrocystic breast-lt   CATARACT EXTRACTION     both   CHOLECYSTECTOMY  2005   COLONOSCOPY     ovarian cyst removed  1985   RADIOACTIVE SEED GUIDED PARTIAL MASTECTOMY WITH AXILLARY SENTINEL LYMPH NODE BIOPSY Right 05/30/2014   Procedure: RADIOACTIVE SEED GUIDED PARTIAL MASTECTOMY WITH AXILLARY SENTINEL LYMPH NODE BIOPSY;  Surgeon: Chevis Pretty III, MD;  Location: [facility];  Service: General;  Laterality: Right;   Family History  Problem Relation Age of Onset   Hypertension Father    Cancer Father 34       esophageal - smoker   Leukemia Mother    Cancer Mother 50       leukemia   Cancer Brother 69       breast CA   Breast cancer Brother 74   Cancer Maternal Aunt 105       leukemia   Cancer Paternal Aunt 33       breast   Breast cancer Paternal Aunt 60   Breast cancer Maternal Grandmother 67   Breast cancer Sister 37   Social History   Socioeconomic History   Marital status: Married    Spouse name: Not on file   Number of children: Not on file   Years of education: Not on file   Highest education level: Not on file  Occupational History   Occupation: OFFICE MANAGER    Employer: [employer]  Tobacco Use   Smoking status: Never   Smokeless tobacco: Never   Tobacco comments:    non smoker  Vaping Use   Vaping status: Never Used  Substance and Sexual Activity   Alcohol use: No    Alcohol/week: 0.0 standard drinks of alcohol   Drug use: No   Sexual activity: Not Currently  Other Topics Concern   Not on file  Social History Narrative    Usually very active.      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   Social Determinants of Health   Financial Resource Strain: Low Risk  (01/24/2023)   Overall Financial Resource Strain (CARDIA)    Difficulty of Paying Living Expenses: Not hard at all  Food Insecurity: No Food Insecurity (01/24/2023)   Hunger Vital Sign    Worried About Running Out of Food in the Last Year: Never true  Ran Out of Food in the Last Year: Never true  Transportation Needs: No Transportation Needs (01/24/2023)   PRAPARE - Administrator, Civil Service (Medical): No    Lack of Transportation (Non-Medical): No  Physical Activity: Insufficiently Active (01/24/2023)   Exercise Vital Sign    Days of Exercise per Week: 3 days    Minutes of Exercise per Session: 30 min  Stress: No Stress Concern Present (01/24/2023)   [employer] of Occupational Health - Occupational Stress Questionnaire    Feeling of Stress : Not at all  Social Connections: Socially Integrated (01/24/2023)   Social Connection and Isolation Panel [NHANES]    Frequency of Communication with Friends and Family: More than three times a week    Frequency of Social Gatherings with Friends and Family: More than three times a week    Attends Religious Services: More than 4 times per year    Active Member of [employer] or Organizations: Yes    Attends Engineer, structural: More than 4 times per year    Marital Status: Married    Tobacco Counseling Counseling given: Not Answered Tobacco comments: non smoker   Clinical Intake:  Pre-visit preparation completed: Yes  Pain : No/denies pain     Nutritional Risks: None Diabetes: No  How often do you need to have someone help you when you read instructions, pamphlets, or other written materials from your doctor or pharmacy?: 1 - Never  Interpreter Needed?: No  Information entered by :: Renie Ora, LPN   Activities of Daily Living    01/24/2023    11:43 AM  In your present state of health, do you have any difficulty performing the following activities:  Hearing? 0  Vision? 0  Difficulty concentrating or making decisions? 0  Walking or climbing stairs? 0  Dressing or bathing? 0  Doing errands, shopping? 0  Preparing Food and eating ? N  Using the Toilet? N  In the past six months, have you accidently leaked urine? N  Do you have problems with loss of bowel control? N  Managing your Medications? N  Managing your Finances? N  Housekeeping or managing your Housekeeping? N    Patient Care Team: Tower, Audrie Gallus, MD as PCP - General (Family Medicine) Griselda Miner, MD as Consulting Physician (General Surgery) Magrinat, Valentino Hue, MD (Inactive) as Consulting Physician (Oncology) Steffanie Dunn, DO as Consulting Physician (Pulmonary Disease) Rollene Rotunda, MD as Consulting Physician (Cardiology)  Indicate any recent Medical Services you may have received from other than [facility] providers in the past year (date may be approximate).     Assessment:   This is a routine wellness examination for Sarah Young.  Hearing/Vision screen Vision Screening - Comments:: Wears rx glasses - up to date with routine eye exams with     Goals Addressed             This Visit's Progress    Patient Stated   On track    Continue current lifestyle       Depression Screen    01/24/2023   11:42 AM 10/30/2021   11:31 AM 08/18/2021    8:43 AM 08/14/2020    9:07 AM 07/27/2019   10:34 AM 05/02/2018    9:34 AM 11/19/2016    9:38 AM  PHQ 2/9 Scores  PHQ - 2 Score 0 0 0 0 0 0 0  PHQ- 9 Score     0 1 4    Fall Risk  01/24/2023   11:40 AM 10/30/2021   11:30 AM 08/14/2020    8:34 AM 07/27/2019   10:34 AM 11/19/2016    9:38 AM  Fall Risk   Falls in the past year? 0 0 0 0 No  Number falls in past yr: 0   0   Injury with Fall? 0   0   Risk for fall due to : No Fall Risks   Medication side effect   Follow up Falls prevention discussed Falls evaluation  completed  Falls evaluation completed;Falls prevention discussed     MEDICARE RISK AT HOME: Medicare Risk at Home Any stairs in or around the home?: Yes If so, are there any without handrails?: No Home free of loose throw rugs in walkways, pet beds, electrical cords, etc?: Yes Adequate lighting in your home to reduce risk of falls?: Yes Life alert?: No Use of a cane, walker or w/c?: No Grab bars in the bathroom?: Yes Shower chair or bench in shower?: Yes Elevated toilet seat or a handicapped toilet?: Yes  TIMED UP AND GO:  Was the test performed?  No    Cognitive Function:    07/27/2019   10:37 AM  MMSE - Mini Mental State Exam  Orientation to time 5  Orientation to Place 5  Registration 3  Attention/ Calculation 5  Recall 3  Language- repeat 1        01/24/2023   11:43 AM 08/18/2021    8:39 AM  6CIT Screen  What Year? 0 points 0 points  What month? 0 points 0 points  What time? 0 points 0 points  Count back from 20 0 points 0 points  Months in reverse 0 points 0 points  Repeat phrase 0 points 0 points  Total Score 0 points 0 points    Immunizations Immunization History  Administered Date(s) Administered   Fluad Quad(high Dose 65+) 12/30/2021   Influenza Whole 12/02/2009   Influenza, High Dose Seasonal PF 02/06/2018, 01/05/2019, 01/17/2020, 01/23/2021   Influenza,inj,Quad PF,6+ Mos 04/20/2013, 03/04/2015, 12/15/2015   Influenza-Unspecified 01/24/2017   PFIZER(Purple Top)SARS-COV-2 Vaccination 05/05/2019, 05/30/2019   Pneumococcal Conjugate-13 03/04/2015   Pneumococcal Polysaccharide-23 05/11/2007   Td 10/23/2003    TDAP status: Due, Education has been provided regarding the importance of this vaccine. Advised may receive this vaccine at local pharmacy or Health Dept. Aware to provide a copy of the vaccination record if obtained from local pharmacy or Health Dept. Verbalized acceptance and understanding.  Flu Vaccine status: Due, Education has been provided  regarding the importance of this vaccine. Advised may receive this vaccine at local pharmacy or Health Dept. Aware to provide a copy of the vaccination record if obtained from local pharmacy or Health Dept. Verbalized acceptance and understanding.  Pneumococcal vaccine status: Up to date  Covid-19 vaccine status: Completed vaccines  Qualifies for Shingles Vaccine? Yes   Zostavax completed No   Shingrix Completed?: No.    Education has been provided regarding the importance of this vaccine. Patient has been advised to call insurance company to determine out of pocket expense if they have not yet received this vaccine. Advised may also receive vaccine at local pharmacy or Health Dept. Verbalized acceptance and understanding.  Screening Tests Health Maintenance  Topic Date Due   Zoster Vaccines- Shingrix (1 of 2) Never done   DTaP/Tdap/Td (2 - Tdap) 10/22/2013   COVID-19 Vaccine (3 - Pfizer risk series) 06/27/2019   INFLUENZA VACCINE  06/27/2023 (Originally 10/28/2022)   MAMMOGRAM  11/18/2023  Medicare Annual Wellness (AWV)  01/24/2024   Pneumonia Vaccine 72+ Years old  Completed   DEXA SCAN  Completed   HPV VACCINES  Aged Out    Health Maintenance  Health Maintenance Due  Topic Date Due   Zoster Vaccines- Shingrix (1 of 2) Never done   DTaP/Tdap/Td (2 - Tdap) 10/22/2013   COVID-19 Vaccine (3 - Pfizer risk series) 06/27/2019    Colorectal cancer screening: No longer required.   Mammogram status: No longer required due to age.  Bone Density status: Completed 08/29/2020. Results reflect: Bone density results: OSTEOPOROSIS. Repeat every 2 years.  Lung Cancer Screening: (Low Dose CT Chest recommended if Age 73-80 years, 20 pack-year currently smoking OR have quit w/in 15years.) does not qualify.   Lung Cancer Screening Referral: n/a  Additional Screening:  Hepatitis C Screening: does not qualify;   Vision Screening: Recommended annual ophthalmology exams for early detection of  glaucoma and other disorders of the eye. Is the patient up to date with their annual eye exam?  Yes  Who is the provider or what is the name of the office in which the patient attends annual eye exams? Dr.hecker  If pt is not established with a provider, would they like to be referred to a provider to establish care? No .   Dental Screening: Recommended annual dental exams for proper oral hygiene   Community Resource Referral / Chronic Care Management: CRR required this visit?  No   CCM required this visit?  No     Plan:     I have personally reviewed and noted the following in the patient's chart:   Medical and social history Use of alcohol, tobacco or illicit drugs  Current medications and supplements including opioid prescriptions. Patient is not currently taking opioid prescriptions. Functional ability and status Nutritional status Physical activity Advanced directives List of other physicians Hospitalizations, surgeries, and ER visits in previous 12 months Vitals Screenings to include cognitive, depression, and falls Referrals and appointments  In addition, I have reviewed and discussed with patient certain preventive protocols, quality metrics, and best practice recommendations. A written personalized care plan for preventive services as well as general preventive health recommendations were provided to patient.     Lorrene Reid, LPN   [id number]/29/5621   After Visit Summary: (MyChart) Due to this being a telephonic visit, the after visit summary with patients personalized plan was offered to patient via MyChart   Nurse Notes: none

## 2023-01-24 NOTE — Patient Instructions (Signed)
Sarah Young , Thank you for taking time to come for your Medicare Wellness Visit. I appreciate your ongoing commitment to your health goals. Please review the following plan we discussed and let me know if I can assist you in the future.   Referrals/Orders/Follow-Ups/Clinician Recommendations: Aim for 30 minutes of exercise or brisk walking, 6-8 glasses of water, and 5 servings of fruits and vegetables each day.   This is a list of the screening recommended for you and due dates:  Health Maintenance  Topic Date Due   Zoster (Shingles) Vaccine (1 of 2) Never done   DTaP/Tdap/Td vaccine (2 - Tdap) 10/22/2013   COVID-19 Vaccine (3 - Pfizer risk series) 06/27/2019   Flu Shot  06/27/2023*   Mammogram  11/18/2023   Medicare Annual Wellness Visit  01/24/2024   Pneumonia Vaccine  Completed   DEXA scan (bone density measurement)  Completed   HPV Vaccine  Aged Out  *Topic was postponed. The date shown is not the original due date.    Advanced directives: (Copy Requested) Please bring a copy of your health care power of attorney and living will to the office to be added to your chart at your convenience.  Next Medicare Annual Wellness Visit scheduled for next year: Yes  Insert Preventive Care attachment Insert FALL PREVENTION attachment if needed

## 2023-02-11 DIAGNOSIS — M791 Myalgia, unspecified site: Secondary | ICD-10-CM | POA: Diagnosis not present

## 2023-02-11 DIAGNOSIS — M533 Sacrococcygeal disorders, not elsewhere classified: Secondary | ICD-10-CM | POA: Diagnosis not present

## 2023-03-01 DIAGNOSIS — M533 Sacrococcygeal disorders, not elsewhere classified: Secondary | ICD-10-CM | POA: Diagnosis not present

## 2023-03-17 DIAGNOSIS — M25551 Pain in right hip: Secondary | ICD-10-CM | POA: Diagnosis not present

## 2023-03-17 DIAGNOSIS — M5441 Lumbago with sciatica, right side: Secondary | ICD-10-CM | POA: Diagnosis not present

## 2023-03-17 DIAGNOSIS — M791 Myalgia, unspecified site: Secondary | ICD-10-CM | POA: Diagnosis not present

## 2023-04-01 DIAGNOSIS — M47816 Spondylosis without myelopathy or radiculopathy, lumbar region: Secondary | ICD-10-CM | POA: Diagnosis not present

## 2023-04-02 ENCOUNTER — Other Ambulatory Visit: Payer: Self-pay | Admitting: Family Medicine

## 2023-04-14 DIAGNOSIS — M47816 Spondylosis without myelopathy or radiculopathy, lumbar region: Secondary | ICD-10-CM | POA: Diagnosis not present

## 2023-04-21 DIAGNOSIS — Z85828 Personal history of other malignant neoplasm of skin: Secondary | ICD-10-CM | POA: Diagnosis not present

## 2023-04-21 DIAGNOSIS — L570 Actinic keratosis: Secondary | ICD-10-CM | POA: Diagnosis not present

## 2023-04-21 DIAGNOSIS — D492 Neoplasm of unspecified behavior of bone, soft tissue, and skin: Secondary | ICD-10-CM | POA: Diagnosis not present

## 2023-04-21 DIAGNOSIS — D225 Melanocytic nevi of trunk: Secondary | ICD-10-CM | POA: Diagnosis not present

## 2023-04-21 DIAGNOSIS — L814 Other melanin hyperpigmentation: Secondary | ICD-10-CM | POA: Diagnosis not present

## 2023-04-21 DIAGNOSIS — L820 Inflamed seborrheic keratosis: Secondary | ICD-10-CM | POA: Diagnosis not present

## 2023-04-21 DIAGNOSIS — L821 Other seborrheic keratosis: Secondary | ICD-10-CM | POA: Diagnosis not present

## 2023-04-21 DIAGNOSIS — Z08 Encounter for follow-up examination after completed treatment for malignant neoplasm: Secondary | ICD-10-CM | POA: Diagnosis not present

## 2023-05-02 DIAGNOSIS — M47816 Spondylosis without myelopathy or radiculopathy, lumbar region: Secondary | ICD-10-CM | POA: Diagnosis not present

## 2023-05-17 DIAGNOSIS — M47816 Spondylosis without myelopathy or radiculopathy, lumbar region: Secondary | ICD-10-CM | POA: Diagnosis not present

## 2023-06-02 DIAGNOSIS — M47816 Spondylosis without myelopathy or radiculopathy, lumbar region: Secondary | ICD-10-CM | POA: Diagnosis not present

## 2023-06-03 ENCOUNTER — Telehealth: Payer: Self-pay

## 2023-06-03 NOTE — Telephone Encounter (Signed)
 Pt hasn't been seen in over 3 years and will therefore require a New Pt Referral.  Will route back to the requesting surgeon's office to make them aware to send referral so we then can get pt a new pt appointment.

## 2023-06-03 NOTE — Telephone Encounter (Signed)
   Name: Sarah Young  DOB: 1941/03/11  MRN: [id number]  Primary Cardiologist: None  Chart reviewed as part of pre-operative protocol coverage. Because of [name]'s past medical history and time since last visit, she will require a follow-up in-office visit in order to better assess preoperative cardiovascular risk. Patient last seen by Dr. Antoine Poche in 2021, she will need to re-establish care.  Pre-op covering staff: - Please schedule appointment and call patient to inform them. If patient already had an upcoming appointment within acceptable timeframe, please add "pre-op clearance" to the appointment notes so provider is aware. - Please contact requesting surgeon's office via preferred method (i.e, phone, fax) to inform them of need for appointment prior to surgery.  Patient is currently prescribed Xarelto by her PCP, recommendations regarding holding of Xarelto will need to come from prescribing provider.   Rip Harbour, NP  06/03/2023, 3:30 PM

## 2023-06-03 NOTE — Telephone Encounter (Signed)
   Pre-operative Risk Assessment    Patient Name: Sarah Young  DOB: May 01, 1940 MRN: [id number]   Date of last office visit: Dr. Rollene Rotunda 09/03/2019  Date of next office visit: None   Request for Surgical Clearance    Procedure:   RFA Radiofrequency Ablation   Date of Surgery:  Clearance TBD (March or April 2025)                                Surgeon: Claria Dice MD Surgeon's Group or Practice Name: [facility] Phone number: [phone] Fax number: [phone] (Attn: Ladona Mow)   Type of Clearance Requested:   - Medical  - Pharmacy:  Hold Rivaroxaban (Xarelto)     Type of Anesthesia:  Not Indicated   Additional requests/questions:    Luellen Pucker   06/03/2023, 3:02 PM

## 2023-06-06 ENCOUNTER — Other Ambulatory Visit: Payer: Self-pay | Admitting: Family Medicine

## 2023-06-07 NOTE — Telephone Encounter (Signed)
 Call placed to surgeon's office to follow-up on new pt referral.  Talked with 2 different people and they couldn't find clearance was even sent.  Was sent to Maggie's phone, no answer / no voicemail.  Will fax this back over and delete it out of our preop pool.  When referral is sent over, pt can be scheduled for surgical clearance.

## 2023-06-07 NOTE — Telephone Encounter (Signed)
 Last CPE/ labs was in 2023, please schedule CPE (labs prior if possible) and then route refill back to me, thans

## 2023-06-08 NOTE — Telephone Encounter (Signed)
 Called  pt and schedule a appt for cpe / labs

## 2023-06-13 DIAGNOSIS — M47816 Spondylosis without myelopathy or radiculopathy, lumbar region: Secondary | ICD-10-CM | POA: Diagnosis not present

## 2023-06-16 DIAGNOSIS — M4316 Spondylolisthesis, lumbar region: Secondary | ICD-10-CM | POA: Diagnosis not present

## 2023-06-19 ENCOUNTER — Telehealth: Payer: Self-pay | Admitting: Family Medicine

## 2023-06-19 DIAGNOSIS — M810 Age-related osteoporosis without current pathological fracture: Secondary | ICD-10-CM

## 2023-06-19 DIAGNOSIS — E782 Mixed hyperlipidemia: Secondary | ICD-10-CM

## 2023-06-19 DIAGNOSIS — I10 Essential (primary) hypertension: Secondary | ICD-10-CM

## 2023-06-19 DIAGNOSIS — E559 Vitamin D deficiency, unspecified: Secondary | ICD-10-CM

## 2023-06-19 NOTE — Telephone Encounter (Signed)
-----   Message from Alvina Chou sent at 06/10/2023  9:40 AM EDT ----- Regarding: Lab orders for MON, 3.24.25 Patient is scheduled for CPX labs, please order future labs, Thanks , Camelia Eng

## 2023-06-20 ENCOUNTER — Other Ambulatory Visit (INDEPENDENT_AMBULATORY_CARE_PROVIDER_SITE_OTHER)

## 2023-06-20 DIAGNOSIS — E559 Vitamin D deficiency, unspecified: Secondary | ICD-10-CM | POA: Diagnosis not present

## 2023-06-20 DIAGNOSIS — E782 Mixed hyperlipidemia: Secondary | ICD-10-CM

## 2023-06-20 DIAGNOSIS — I10 Essential (primary) hypertension: Secondary | ICD-10-CM | POA: Diagnosis not present

## 2023-06-20 LAB — COMPREHENSIVE METABOLIC PANEL
ALT: 12 U/L (ref 0–35)
AST: 20 U/L (ref 0–37)
Albumin: 3.8 g/dL (ref 3.5–5.2)
Alkaline Phosphatase: 58 U/L (ref 39–117)
BUN: 14 mg/dL (ref 6–23)
CO2: 30 meq/L (ref 19–32)
Calcium: 9.4 mg/dL (ref 8.4–10.5)
Chloride: 106 meq/L (ref 96–112)
Creatinine, Ser: 1.18 mg/dL (ref 0.40–1.20)
GFR: 42.83 mL/min — ABNORMAL LOW (ref >60.00)
Glucose, Bld: 85 mg/dL (ref 70–99)
Potassium: 4.9 meq/L (ref 3.5–5.1)
Sodium: 142 meq/L (ref 135–145)
Total Bilirubin: 0.7 mg/dL (ref 0.2–1.2)
Total Protein: 6 g/dL (ref 6.0–8.3)

## 2023-06-20 LAB — CBC WITH DIFFERENTIAL/PLATELET
Basophils Absolute: 0.1 10*3/uL (ref 0.0–0.1)
Basophils Relative: 1.2 % (ref 0.0–3.0)
Eosinophils Absolute: 0.1 10*3/uL (ref 0.0–0.7)
Eosinophils Relative: 2.8 % (ref 0.0–5.0)
HCT: 42.6 % (ref 36.0–46.0)
Hemoglobin: 13.9 g/dL (ref 12.0–15.0)
Lymphocytes Relative: 32.3 % (ref 12.0–46.0)
Lymphs Abs: 1.6 10*3/uL (ref 0.7–4.0)
MCHC: 32.6 g/dL (ref 30.0–36.0)
MCV: 91.2 fl (ref 78.0–100.0)
Monocytes Absolute: 0.5 10*3/uL (ref 0.1–1.0)
Monocytes Relative: 9.1 % (ref 3.0–12.0)
Neutro Abs: 2.7 10*3/uL (ref 1.4–7.7)
Neutrophils Relative %: 54.6 % (ref 43.0–77.0)
Platelets: 243 10*3/uL (ref 150.0–400.0)
RBC: 4.67 Mil/uL (ref 3.87–5.11)
RDW: 13.9 % (ref 11.5–15.5)
WBC: 4.9 10*3/uL (ref 4.0–10.5)

## 2023-06-20 LAB — TSH: TSH: 2.5 u[IU]/mL (ref 0.35–5.50)

## 2023-06-20 LAB — VITAMIN D 25 HYDROXY (VIT D DEFICIENCY, FRACTURES): VITD: 33.29 ng/mL (ref 30.00–100.00)

## 2023-06-20 LAB — LIPID PANEL
Cholesterol: 244 mg/dL — ABNORMAL HIGH (ref 0–200)
HDL: 71.3 mg/dL (ref >39.00)
LDL Cholesterol: 153 mg/dL — ABNORMAL HIGH (ref 0–99)
NonHDL: 172.4
Total CHOL/HDL Ratio: 3
Triglycerides: 97 mg/dL (ref 0.0–149.0)
VLDL: 19.4 mg/dL (ref 0.0–40.0)

## 2023-06-27 ENCOUNTER — Ambulatory Visit (INDEPENDENT_AMBULATORY_CARE_PROVIDER_SITE_OTHER): Admitting: Family Medicine

## 2023-06-27 ENCOUNTER — Encounter: Payer: Self-pay | Admitting: Family Medicine

## 2023-06-27 VITALS — BP 130/78 | HR 72 | Temp 98.4°F | Ht 63.5 in | Wt 148.4 lb

## 2023-06-27 DIAGNOSIS — Z0000 Encounter for general adult medical examination without abnormal findings: Secondary | ICD-10-CM

## 2023-06-27 DIAGNOSIS — I700 Atherosclerosis of aorta: Secondary | ICD-10-CM

## 2023-06-27 DIAGNOSIS — M797 Fibromyalgia: Secondary | ICD-10-CM | POA: Diagnosis not present

## 2023-06-27 DIAGNOSIS — E782 Mixed hyperlipidemia: Secondary | ICD-10-CM

## 2023-06-27 DIAGNOSIS — E559 Vitamin D deficiency, unspecified: Secondary | ICD-10-CM | POA: Diagnosis not present

## 2023-06-27 DIAGNOSIS — M810 Age-related osteoporosis without current pathological fracture: Secondary | ICD-10-CM

## 2023-06-27 DIAGNOSIS — I10 Essential (primary) hypertension: Secondary | ICD-10-CM | POA: Diagnosis not present

## 2023-06-27 DIAGNOSIS — F411 Generalized anxiety disorder: Secondary | ICD-10-CM

## 2023-06-27 DIAGNOSIS — D6859 Other primary thrombophilia: Secondary | ICD-10-CM

## 2023-06-27 DIAGNOSIS — Z86711 Personal history of pulmonary embolism: Secondary | ICD-10-CM

## 2023-06-27 NOTE — Assessment & Plan Note (Signed)
 This is worsening her baseline chronic back pain  Continues amitriptyline

## 2023-06-27 NOTE — Assessment & Plan Note (Signed)
 Blood pressure and cholesterol control are important  Given info on crestor to consider for LDL over 150

## 2023-06-27 NOTE — Assessment & Plan Note (Signed)
 Dexa reviewed 08/2020 Due in June- order in / she will schedule  Past tamoxifen  No new falls/ fracture  Discussed fall prevention, supplements and exercise for bone density   Exercise limited due to chronic pain issues

## 2023-06-27 NOTE — Assessment & Plan Note (Signed)
 bp in fair control at this time  BP Readings from Last 1 Encounters:  06/27/23 130/78   No changes needed Most recent labs reviewed  Disc lifstyle change with low sodium diet and exercise  Plan to continue amlodipine 5 mg daily

## 2023-06-27 NOTE — Assessment & Plan Note (Signed)
 Reviewed health habits including diet and exercise and skin cancer prevention Reviewed appropriate screening tests for age  Also reviewed health mt list, fam hx and immunization status , as well as social and family history   See HPI Labs reviewed and ordered Health Maintenance  Topic Date Due   Zoster (Shingles) Vaccine (1 of 2) 09/26/2023*   DTaP/Tdap/Td vaccine (2 - Tdap) 06/26/2024*   COVID-19 Vaccine (3 - Pfizer risk series) 07/12/2024*   Mammogram  11/18/2023   Medicare Annual Wellness Visit  01/24/2024   Pneumonia Vaccine  Completed   Flu Shot  Completed   DEXA scan (bone density measurement)  Completed   HPV Vaccine  Aged Out  *Topic was postponed. The date shown is not the original due date.    Plans to get shingrix at pharmacy  Also update tetanus shot  Dexa ordered  Discussed fall prevention, supplements and exercise for bone density  PHQ last 0

## 2023-06-27 NOTE — Assessment & Plan Note (Signed)
 Disc goals for lipids and reasons to control them Rev last labs with pt Rev low sat fat diet in detail LDL is up to 153  HDL good but down a bit   Discussed use of statin  Given handout on crestor to consider  Will update [place] after consideration  Also work on diet

## 2023-06-27 NOTE — Progress Notes (Signed)
 Subjective:    Patient ID: Sarah Young, female    DOB: 04-05-1940, 83 y.o.   MRN: [id number]  HPI  Here for health maintenance exam and to review chronic medical problems   Wt Readings from Last 3 Encounters:  06/27/23 148 lb 6 oz (67.3 kg)  01/24/23 150 lb (68 kg)  11/09/22 151 lb 4 oz (68.6 kg)   25.87 kg/m  Vitals:   06/27/23 1105  BP: 130/78  Pulse: 72  Temp: 98.4 F (36.9 C)  SpO2: 98%    Immunization History  Administered Date(s) Administered   Fluad Quad(high Dose 65+) 12/30/2021   Influenza Whole 12/02/2009   Influenza, High Dose Seasonal PF 02/06/2018, 01/05/2019, 01/17/2020, 01/23/2021, 01/28/2023   Influenza,inj,Quad PF,6+ Mos 04/20/2013, 03/04/2015, 12/15/2015   Influenza-Unspecified 01/24/2017   PFIZER(Purple Top)SARS-COV-2 Vaccination 05/05/2019, 05/30/2019   Pneumococcal Conjugate-13 03/04/2015   Pneumococcal Polysaccharide-23 05/11/2007   Td 10/23/2003    There are no preventive care reminders to display for this patient.  Having a lot of back issues  Has several injections  Now seeing Dr Yetta Barre neuro surgeon  Waiting for MRI now to see if she needs surgery    Shingrix- interested /plans to get in pharmacy   Tetanus -will get in pharmacy    Mammogram 10/2022 -had to have addn views/were normal Personal past history of breast cancer  Sister and brother had breast cancer , also aunts  Self breast exam- no new lumps   Gyn health s/p hysterectomy    Colon cancer screening -out aged   Bone health  Dexa 08/2020 at the breast center  Osteoporosis Tamoxifen in past  Falls-none Fractures-none Supplements ca and D  Last vitamin D Lab Results  Component Value Date   VD25OH 33.29 06/20/2023    Exercise  Cannot tolerate much movement with chronic pain      Mood    01/24/2023   11:42 AM 10/30/2021   11:31 AM 08/18/2021    8:43 AM 08/14/2020    9:07 AM 07/27/2019   10:34 AM  Depression screen PHQ 2/9  Decreased Interest 0 0  0 0 0  Down, Depressed, Hopeless 0 0 0 0 0  PHQ - 2 Score 0 0 0 0 0  Altered sleeping     0  Tired, decreased energy     0  Change in appetite     0  Feeling bad or failure about yourself      0  Trouble concentrating     0  Moving slowly or fidgety/restless     0  Suicidal thoughts     0  PHQ-9 Score     0  Difficult doing work/chores     Not difficult at all  GAD and myofascial pain disorder Taking amitriptyline 10 mg daily   Still works part time  [employer] easily   HTN bp is stable today  No cp or palpitations or headaches or edema  No side effects to medicines  BP Readings from Last 3 Encounters:  06/27/23 130/78  11/09/22 118/68  08/03/22 122/68    Amlodipine 5 mg daily   History of DVT unprovoked and PE  On xarelto   Lab Results  Component Value Date   NA 142 06/20/2023   K 4.9 06/20/2023   CO2 30 06/20/2023   GLUCOSE 85 06/20/2023   BUN 14 06/20/2023   CREATININE 1.18 06/20/2023   CALCIUM 9.4 06/20/2023   GFR 42.83 (L) 06/20/2023   EGFR 40 (L)  02/03/2017   GFRNONAA 55 (L) 01/28/2021   Stable   Lab Results  Component Value Date   ALT 12 06/20/2023   AST 20 06/20/2023   ALKPHOS 58 06/20/2023   BILITOT 0.7 06/20/2023    Hyperlipidemia Lab Results  Component Value Date   CHOL 244 (H) 06/20/2023   CHOL 238 (H) 12/30/2021   CHOL 256 (H) 08/07/2020   Lab Results  Component Value Date   HDL 71.30 06/20/2023   HDL 81.60 12/30/2021   HDL 91.80 08/07/2020   Lab Results  Component Value Date   LDLCALC 153 (H) 06/20/2023   LDLCALC 138 (H) 12/30/2021   LDLCALC 150 (H) 08/07/2020   Lab Results  Component Value Date   TRIG 97.0 06/20/2023   TRIG 90.0 12/30/2021   TRIG 68.0 08/07/2020   Lab Results  Component Value Date   CHOLHDL 3 06/20/2023   CHOLHDL 3 12/30/2021   CHOLHDL 3 08/07/2020   Lab Results  Component Value Date   LDLDIRECT 143.9 04/20/2013   LDLDIRECT 125.7 10/07/2009   LDLDIRECT 133.9 04/04/2009   LDL is up  significantly  Not eating a lot cholesterol diet  Eats a lot of creamy soups       Patient Active Problem List   Diagnosis Date Noted   Hemorrhoids, external 11/09/2022   Nausea vomiting and diarrhea 07/26/2022   Medication intolerance 07/26/2022   Cellulitis of left lower extremity 07/13/2022   Recurrent epistaxis 05/27/2022   Herpes zoster 02/02/2021   Medicare annual wellness visit, subsequent 08/14/2020   Aortic atherosclerosis (HCC) 08/23/2019   Dizziness 07/11/2019   Deep vein thrombosis (DVT) of left lower extremity (HCC) 05/25/2019   Hypercoagulopathy (HCC) 05/22/2019   History of pulmonary embolism 05/07/2019   Osteoporosis 05/02/2018   Routine general medical examination at a health care facility 11/21/2016   Estrogen deficiency 11/19/2016   Pedal edema 12/15/2015   Family history of breast cancer in female 05/17/2014   Malignant neoplasm of upper-outer quadrant of right breast in female, estrogen receptor positive (HCC) 05/08/2014   GERD (gastroesophageal reflux disease) 03/01/2014   Lumbar degenerative disc disease 08/15/2013   Chronic pain of scapula 08/15/2013   Essential hypertension 10/02/2008   Migraine 09/06/2008   Vitamin D deficiency 06/27/2007   HYPERLIPIDEMIA 05/11/2007   Generalized anxiety disorder 05/11/2007   Disorder of bone and cartilage 05/11/2007   H/O poliomyelitis 05/08/2007   OSTEOARTHRITIS, HANDS, BILATERAL 05/08/2007   Fibromyalgia 05/08/2007   URINARY INCONTINENCE, STRESS, MILD 05/08/2007   Past Medical History:  Diagnosis Date   Anxiety    Arthritis    OA of hands   Breast cancer of upper-outer quadrant of right female breast (HCC) 05/08/2014   DVT (deep venous thrombosis) (HCC) 1980s   Fibromyalgia    Hyperlipidemia    Hypertension    Irritable bowel syndrome    Migraine    Osteopenia    Personal history of radiation therapy    Pulmonary embolus (HCC) 2021   S/P radiation therapy 06/2014   Right breast 4250 cGy in 17  sessions, right breast boost 1200 cGy in 6 sessions  = 07/11/2014 through 08/12/2014                         Vitamin D deficiency    Wears glasses    Past Surgical History:  Procedure Laterality Date   ABDOMINAL HYSTERECTOMY  1972   APPENDECTOMY     BREAST EXCISIONAL BIOPSY Left    BREAST  LUMPECTOMY Right 2016   BREAST SURGERY  1995   lumpectomy fibrocystic breast-lt   CATARACT EXTRACTION     both   CHOLECYSTECTOMY  2005   COLONOSCOPY     ovarian cyst removed  1985   RADIOACTIVE SEED GUIDED PARTIAL MASTECTOMY WITH AXILLARY SENTINEL LYMPH NODE BIOPSY Right 05/30/2014   Procedure: RADIOACTIVE SEED GUIDED PARTIAL MASTECTOMY WITH AXILLARY SENTINEL LYMPH NODE BIOPSY;  Surgeon: Chevis Pretty III, MD;  Location: [facility];  Service: General;  Laterality: Right;   Social History   Tobacco Use   Smoking status: Never   Smokeless tobacco: Never   Tobacco comments:    non smoker  Vaping Use   Vaping status: Never Used  Substance Use Topics   Alcohol use: No    Alcohol/week: 0.0 standard drinks of alcohol   Drug use: No   Family History  Problem Relation Age of Onset   Hypertension Father    Cancer Father 31       esophageal - smoker   Leukemia Mother    Cancer Mother 49       leukemia   Cancer Brother 67       breast CA   Breast cancer Brother 20   Cancer Maternal Aunt 56       leukemia   Cancer Paternal Aunt 68       breast   Breast cancer Paternal Aunt 60   Breast cancer Maternal Grandmother 29   Breast cancer Sister 70   Allergies  Allergen Reactions   Amoxicillin Itching and Other (See Comments)     Paradoxical Reaction- made infection worse    Amoxicillin-Pot Clavulanate Other (See Comments)    Felt like body turned "inside out"   Esomeprazole Magnesium Nausea And Vomiting   Gabapentin Other (See Comments)    Caused nightmares   Omeprazole Nausea And Vomiting   Other Itching, Swelling and Other (See Comments)    Ant venom = Made the site (where  bitten) swell   Penicillins Other (See Comments)   Ranitidine Hcl Other (See Comments)    "Not effective"   Sulfa Antibiotics    Wasp Venom Swelling and Other (See Comments)    Made leg swell where stung   Current Outpatient Medications on File Prior to Visit  Medication Sig Dispense Refill   acetaminophen (TYLENOL) 500 MG tablet Take 1,000 mg by mouth every 6 (six) hours as needed for mild pain (or headaches).     amitriptyline (ELAVIL) 10 MG tablet TAKE 2 TABLETS (20 MG TOTAL) BY MOUTH AT BEDTIME. 180 tablet 2   amLODipine (NORVASC) 5 MG tablet TAKE 1 TABLET (5 MG TOTAL) BY MOUTH DAILY. 90 tablet 0   Ascorbic Acid (VITAMIN C PO) Take 1 tablet by mouth every morning.     Calcium Carb-Cholecalciferol (CALCIUM + D3 PO) Take 1 tablet by mouth in the morning.     Cholecalciferol (VITAMIN D-3) 25 MCG (1000 UT) CAPS Take 1,000 Units by mouth daily.     Cyanocobalamin (VITAMIN B-12 PO) Take 1 tablet by mouth every morning.     hydrocortisone (ANUSOL-HC) 2.5 % rectal cream Place 1 Application rectally 2 (two) times daily. To affected area/hemorrhoids for 10 days maximum 30 g 0   Multiple Vitamins-Minerals (ZINC PO) Take 1 tablet by mouth 2 (two) times a week.     Polyethyl Glycol-Propyl Glycol (SYSTANE) 0.4-0.3 % SOLN Place 1 drop into both eyes 2 (two) times daily.     SUMAtriptan (IMITREX) 100 MG  tablet TAKE 1 TABLET BY MOUTH AS NEEDED FOR MIGRAINE, MAY REPEAT IN 2 HOURS IF NEEDED. MAX 2 TABS IN 24 HOURS. 9 tablet 11   traMADol (ULTRAM) 50 MG tablet Take 50 mg by mouth daily as needed.     XARELTO 20 MG TABS tablet TAKE 1 TABLET BY MOUTH DAILY WITH SUPPER. 90 tablet 3   No current facility-administered medications on file prior to visit.    Review of Systems  Constitutional:  Negative for activity change, appetite change, fatigue, fever and unexpected weight change.  HENT:  Negative for congestion, ear pain, rhinorrhea, sinus pressure and sore throat.   Eyes:  Negative for pain, redness and  visual disturbance.  Respiratory:  Negative for cough, shortness of breath and wheezing.   Cardiovascular:  Negative for chest pain and palpitations.  Gastrointestinal:  Negative for abdominal pain, blood in stool, constipation and diarrhea.  Endocrine: Negative for polydipsia and polyuria.  Genitourinary:  Negative for dysuria, frequency and urgency.  Musculoskeletal:  Positive for arthralgias and back pain. Negative for myalgias.  Skin:  Negative for pallor and rash.  Allergic/Immunologic: Negative for environmental allergies.  Neurological:  Negative for dizziness, syncope and headaches.  Hematological:  Negative for adenopathy. Does not bruise/bleed easily.  Psychiatric/Behavioral:  Negative for decreased concentration and dysphoric mood. The patient is not nervous/anxious.        Objective:   Physical Exam Constitutional:      General: She is not in acute distress.    Appearance: Normal appearance. She is well-developed and normal weight. She is not ill-appearing or diaphoretic.  HENT:     Head: Normocephalic and atraumatic.     Right Ear: Tympanic membrane, ear canal and external ear normal.     Left Ear: Tympanic membrane, ear canal and external ear normal.     Nose: Nose normal. No congestion.     Mouth/Throat:     Mouth: Mucous membranes are moist.     Pharynx: Oropharynx is clear. No posterior oropharyngeal erythema.  Eyes:     General: No scleral icterus.    Extraocular Movements: Extraocular movements intact.     Conjunctiva/sclera: Conjunctivae normal.     Pupils: Pupils are equal, round, and reactive to light.  Neck:     Thyroid: No thyromegaly.     Vascular: No carotid bruit or JVD.  Cardiovascular:     Rate and Rhythm: Normal rate and regular rhythm.     Pulses: Normal pulses.     Heart sounds: Normal heart sounds.     No gallop.  Pulmonary:     Effort: Pulmonary effort is normal. No respiratory distress.     Breath sounds: Normal breath sounds. No wheezing.      Comments: Good air exch Chest:     Chest wall: No tenderness.  Abdominal:     General: Bowel sounds are normal. There is no distension or abdominal bruit.     Palpations: Abdomen is soft. There is no mass.     Tenderness: There is no abdominal tenderness.     Hernia: No hernia is present.  Genitourinary:    Comments: Breast exam: No mass, nodules, thickening, tenderness, bulging, retraction, inflamation, nipple discharge or skin changes noted.  No axillary or clavicular LA.     Musculoskeletal:        General: No tenderness. Normal range of motion.     Cervical back: Normal range of motion and neck supple. No rigidity. No muscular tenderness.     Right  lower leg: No edema.     Left lower leg: No edema.     Comments: No kyphosis   Limited rom joints   Limited rom spine   Lymphadenopathy:     Cervical: No cervical adenopathy.  Skin:    General: Skin is warm and dry.     Coloration: Skin is not pale.     Findings: No erythema or rash.     Comments: Solar lentigines diffusely Scattered sks   Neurological:     Mental Status: She is alert. Mental status is at baseline.     Cranial Nerves: No cranial nerve deficit.     Motor: No abnormal muscle tone.     Coordination: Coordination normal.     Gait: Gait normal.     Deep Tendon Reflexes: Reflexes are normal and symmetric. Reflexes normal.  Psychiatric:        Mood and Affect: Mood normal.        Cognition and Memory: Cognition and memory normal.           Assessment & Plan:   Problem List Items Addressed This Visit       Cardiovascular and Mediastinum   Essential hypertension (Chronic)   bp in fair control at this time  BP Readings from Last 1 Encounters:  06/27/23 130/78   No changes needed Most recent labs reviewed  Disc lifstyle change with low sodium diet and exercise  Plan to continue amlodipine 5 mg daily         Musculoskeletal and Integument   Osteoporosis   Dexa reviewed 08/2020 Due in June-  order in / she will schedule  Past tamoxifen  No new falls/ fracture  Discussed fall prevention, supplements and exercise for bone density   Exercise limited due to chronic pain issues       Relevant Orders   DG Bone Density     Other   HYPERLIPIDEMIA (Chronic)   Disc goals for lipids and reasons to control them Rev last labs with pt Rev low sat fat diet in detail LDL is up to 153  HDL good but down a bit   Discussed use of statin  Given handout on crestor to consider  Will update [place] after consideration  Also work on diet        Vitamin D deficiency   Routine general medical examination at a health care facility - Primary   Reviewed health habits including diet and exercise and skin cancer prevention Reviewed appropriate screening tests for age  Also reviewed health mt list, fam hx and immunization status , as well as social and family history   See HPI Labs reviewed and ordered Health Maintenance  Topic Date Due   Zoster (Shingles) Vaccine (1 of 2) 09/26/2023*   DTaP/Tdap/Td vaccine (2 - Tdap) 06/26/2024*   COVID-19 Vaccine (3 - Pfizer risk series) 07/12/2024*   Mammogram  11/18/2023   Medicare Annual Wellness Visit  01/24/2024   Pneumonia Vaccine  Completed   Flu Shot  Completed   DEXA scan (bone density measurement)  Completed   HPV Vaccine  Aged Out  *Topic was postponed. The date shown is not the original due date.    Plans to get shingrix at pharmacy  Also update tetanus shot  Dexa ordered  Discussed fall prevention, supplements and exercise for bone density  PHQ last 0       Hypercoagulopathy (HCC)   Continues xarelto  Unprovoked DVT and PE in past  History of pulmonary embolism   Unprovoked  Continues xarelto       Generalized anxiety disorder   Continues amitriptyline for this and myofascial pain and sleep       Fibromyalgia   This is worsening her baseline chronic back pain  Continues amitriptyline      Relevant Medications    traMADol (ULTRAM) 50 MG tablet

## 2023-06-27 NOTE — Assessment & Plan Note (Signed)
 Continues amitriptyline for this and myofascial pain and sleep

## 2023-06-27 NOTE — Assessment & Plan Note (Signed)
 Unprovoked  Continues xarelto

## 2023-06-27 NOTE — Patient Instructions (Addendum)
 If you are interested in the new shingles vaccine (Shingrix) - call your local pharmacy to check on coverage and availability   Cholesterol is up  Avoid red meat/ fried foods/ egg yolks/ fatty breakfast meats/ butter, cheese and high fat dairy/ and shellfish    Let us know if you want to try a low dose of cholesterol medicine like rosuvastatin  Here are some handouts     You are also due for a tetanus shot   You have an order for:  []   2D Mammogram  []   3D Mammogram  [x]   Bone Density     Please call for appointment:   []   [facility]  [address] [place] [address]  [phone]  []   [facility])   [address]  [place], [place] [address]  [phone]  [x]   [facility]      [address] [place], [place]        [phone]         []   [facility]  [address] [place], [place]  [phone]  []  [facility]   [address]   [place], [place] [address]  [phone]  []  [facility]  [address] [place], [place] [address] [phone]    Make sure to wear two piece clothing  No lotions powders or deodorants the day of the appointment Make sure to bring picture ID and insurance card.  Bring list of medications you are currently taking including any supplements.   Schedule your screening mammogram through MyChart!   Select [facility] imaging sites can now be scheduled through MyChart.  Log into your MyChart account.  Go to 'Visit' (or 'Appointments' if  on mobile App) --> Schedule an  Appointment  Under 'Select a Reason for Visit' choose the Mammogram  Screening option.  Complete the pre-visit questions  and select the time and place that  best fits your schedule

## 2023-06-27 NOTE — Assessment & Plan Note (Signed)
 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

## 2023-06-27 NOTE — Assessment & Plan Note (Signed)
Level drawn Disc imp for bone and overall health

## 2023-07-06 ENCOUNTER — Other Ambulatory Visit: Payer: Self-pay | Admitting: Family Medicine

## 2023-07-06 NOTE — Telephone Encounter (Signed)
 CPE was on 06/27/23  Imitrex last filled on 03/17/22 #9 tabs/ 11 refills  Elavil last filled on 04/07/22 #180 tabs/ 2 refills

## 2023-07-12 DIAGNOSIS — M4316 Spondylolisthesis, lumbar region: Secondary | ICD-10-CM | POA: Diagnosis not present

## 2023-07-28 DIAGNOSIS — M5416 Radiculopathy, lumbar region: Secondary | ICD-10-CM | POA: Diagnosis not present

## 2023-07-28 DIAGNOSIS — M4316 Spondylolisthesis, lumbar region: Secondary | ICD-10-CM | POA: Diagnosis not present

## 2023-07-29 ENCOUNTER — Telehealth: Payer: Self-pay | Admitting: *Deleted

## 2023-07-29 NOTE — Telephone Encounter (Signed)
 Copied from CRM [id number]. Topic: Clinical - Medication Question >> Jul 29, 2023  4:41 PM [place] J wrote: Reason for CRM: Patient was wondering if it would be okay to be taken off of her blood thinner (XARELTO  20 MG TABS tablet). Patient needs to know so she can continue with a procedure with the surgical center. She was also wondering if alendronate sodium tablets would be something she could get prescribed to help her with her osteoporosis .

## 2023-07-31 NOTE — Telephone Encounter (Signed)
 It is ok to hold xarelto  for her procedure- 72 hours prior or per her surgeon / to re start based on her surgeon's recommendation  When ready-please follow up to discuss alendronate or other medicine options for bone density

## 2023-08-01 NOTE — Telephone Encounter (Signed)
Pt notified of Dr. Tower's comments and verbalized understanding  

## 2023-08-08 DIAGNOSIS — M5416 Radiculopathy, lumbar region: Secondary | ICD-10-CM | POA: Diagnosis not present

## 2023-09-03 ENCOUNTER — Other Ambulatory Visit: Payer: Self-pay | Admitting: Family Medicine

## 2023-09-05 ENCOUNTER — Telehealth: Payer: Self-pay | Admitting: Family Medicine

## 2023-09-05 DIAGNOSIS — M810 Age-related osteoporosis without current pathological fracture: Secondary | ICD-10-CM

## 2023-09-05 NOTE — Telephone Encounter (Signed)
 Order is in  Please give # to schedule  Thanks

## 2023-09-05 NOTE — Telephone Encounter (Signed)
 CPE was on 06/27/23   Elavil  last filled on 07/06/23 #180 tabs/ 3 refills

## 2023-09-05 NOTE — Telephone Encounter (Signed)
 For dexa or mammogram?   [facility]   Correct-the [facility] center of [facility] imaging no longer does dexa   Thanks

## 2023-09-05 NOTE — Telephone Encounter (Signed)
 Pt notified order changed. Pt will call and schedule appt

## 2023-09-05 NOTE — Telephone Encounter (Signed)
 Copied from CRM [id number]. Topic: Referral - Question >> Sep 05, 2023  8:50 AM Zipporah Him wrote: Reason for CRM: Patient states she needs referral sent tp breast center [place], [phone] Phone Number. Stated they can see patient but they do a need a referral first. Please call when this has been completed.   (Original referral site called patient and stated they no longer do DEXA scans)

## 2023-09-05 NOTE — Telephone Encounter (Signed)
 DEXA, breast center stop doing them. Wants to have it done at [facility]

## 2023-09-05 NOTE — Addendum Note (Signed)
 Addended by: Deri Fleet A on: 09/05/2023 01:56 PM   Modules accepted: Orders

## 2023-09-05 NOTE — Telephone Encounter (Signed)
 Too early ?   Refill in April for the year Is this for pharmacy change or different reason?

## 2023-09-06 DIAGNOSIS — H524 Presbyopia: Secondary | ICD-10-CM | POA: Diagnosis not present

## 2023-09-06 DIAGNOSIS — H35363 Drusen (degenerative) of macula, bilateral: Secondary | ICD-10-CM | POA: Diagnosis not present

## 2023-09-06 DIAGNOSIS — H04123 Dry eye syndrome of bilateral lacrimal glands: Secondary | ICD-10-CM | POA: Diagnosis not present

## 2023-09-06 DIAGNOSIS — H35013 Changes in retinal vascular appearance, bilateral: Secondary | ICD-10-CM | POA: Diagnosis not present

## 2023-09-06 DIAGNOSIS — D3131 Benign neoplasm of right choroid: Secondary | ICD-10-CM | POA: Diagnosis not present

## 2023-09-13 ENCOUNTER — Other Ambulatory Visit: Payer: Self-pay | Admitting: Family Medicine

## 2023-09-19 DIAGNOSIS — M5416 Radiculopathy, lumbar region: Secondary | ICD-10-CM | POA: Diagnosis not present

## 2023-10-05 ENCOUNTER — Other Ambulatory Visit: Payer: Self-pay | Admitting: Family Medicine

## 2023-10-05 DIAGNOSIS — Z1231 Encounter for screening mammogram for malignant neoplasm of breast: Secondary | ICD-10-CM

## 2023-10-19 DIAGNOSIS — Z85828 Personal history of other malignant neoplasm of skin: Secondary | ICD-10-CM | POA: Diagnosis not present

## 2023-10-19 DIAGNOSIS — L814 Other melanin hyperpigmentation: Secondary | ICD-10-CM | POA: Diagnosis not present

## 2023-10-19 DIAGNOSIS — L538 Other specified erythematous conditions: Secondary | ICD-10-CM | POA: Diagnosis not present

## 2023-10-19 DIAGNOSIS — L570 Actinic keratosis: Secondary | ICD-10-CM | POA: Diagnosis not present

## 2023-10-19 DIAGNOSIS — L821 Other seborrheic keratosis: Secondary | ICD-10-CM | POA: Diagnosis not present

## 2023-10-19 DIAGNOSIS — Z08 Encounter for follow-up examination after completed treatment for malignant neoplasm: Secondary | ICD-10-CM | POA: Diagnosis not present

## 2023-10-19 DIAGNOSIS — D225 Melanocytic nevi of trunk: Secondary | ICD-10-CM | POA: Diagnosis not present

## 2023-10-19 DIAGNOSIS — D492 Neoplasm of unspecified behavior of bone, soft tissue, and skin: Secondary | ICD-10-CM | POA: Diagnosis not present

## 2023-11-07 ENCOUNTER — Other Ambulatory Visit

## 2023-11-09 ENCOUNTER — Other Ambulatory Visit

## 2023-11-14 DIAGNOSIS — M5416 Radiculopathy, lumbar region: Secondary | ICD-10-CM | POA: Diagnosis not present

## 2023-11-15 ENCOUNTER — Other Ambulatory Visit

## 2023-11-15 ENCOUNTER — Ambulatory Visit: Payer: Self-pay | Admitting: Family Medicine

## 2023-11-15 ENCOUNTER — Ambulatory Visit
Admission: RE | Admit: 2023-11-15 | Discharge: 2023-11-15 | Disposition: A | Discharge status: Home / Self Care | Source: Ambulatory Visit | Attending: Family Medicine | Admitting: Family Medicine

## 2023-11-15 DIAGNOSIS — M810 Age-related osteoporosis without current pathological fracture: Secondary | ICD-10-CM | POA: Diagnosis not present

## 2023-11-15 DIAGNOSIS — Z780 Asymptomatic menopausal state: Secondary | ICD-10-CM | POA: Diagnosis not present

## 2023-11-21 ENCOUNTER — Ambulatory Visit
Admission: RE | Admit: 2023-11-21 | Discharge: 2023-11-21 | Disposition: A | Discharge status: Home / Self Care | Source: Ambulatory Visit | Attending: Family Medicine | Admitting: Family Medicine

## 2023-11-21 DIAGNOSIS — Z1231 Encounter for screening mammogram for malignant neoplasm of breast: Secondary | ICD-10-CM

## 2023-11-23 ENCOUNTER — Ambulatory Visit: Payer: Self-pay | Admitting: Family Medicine

## 2023-12-05 ENCOUNTER — Telehealth: Payer: Self-pay | Admitting: *Deleted

## 2023-12-05 ENCOUNTER — Ambulatory Visit (INDEPENDENT_AMBULATORY_CARE_PROVIDER_SITE_OTHER)
Admission: RE | Admit: 2023-12-05 | Discharge: 2023-12-05 | Disposition: A | Discharge status: Home / Self Care | Source: Ambulatory Visit | Attending: Nurse Practitioner | Admitting: Nurse Practitioner

## 2023-12-05 ENCOUNTER — Ambulatory Visit (INDEPENDENT_AMBULATORY_CARE_PROVIDER_SITE_OTHER): Admitting: Nurse Practitioner

## 2023-12-05 VITALS — BP 130/82 | HR 78 | Temp 97.8°F | Ht 63.5 in | Wt 143.6 lb

## 2023-12-05 DIAGNOSIS — U071 COVID-19: Secondary | ICD-10-CM

## 2023-12-05 DIAGNOSIS — R0602 Shortness of breath: Secondary | ICD-10-CM | POA: Diagnosis not present

## 2023-12-05 DIAGNOSIS — R059 Cough, unspecified: Secondary | ICD-10-CM | POA: Diagnosis not present

## 2023-12-05 DIAGNOSIS — R051 Acute cough: Secondary | ICD-10-CM | POA: Diagnosis not present

## 2023-12-05 DIAGNOSIS — R0689 Other abnormalities of breathing: Secondary | ICD-10-CM | POA: Diagnosis not present

## 2023-12-05 DIAGNOSIS — I700 Atherosclerosis of aorta: Secondary | ICD-10-CM | POA: Diagnosis not present

## 2023-12-05 LAB — POC COVID19 BINAXNOW: SARS Coronavirus 2 Ag: NEGATIVE

## 2023-12-05 MED ORDER — BENZONATATE 100 MG PO CAPS
100.0000 mg | ORAL_CAPSULE | Freq: Three times a day (TID) | ORAL | 0 refills | Status: AC | PRN
Start: 2023-12-05 — End: ?

## 2023-12-05 MED ORDER — DOXYCYCLINE HYCLATE 100 MG PO TABS
100.0000 mg | ORAL_TABLET | Freq: Two times a day (BID) | ORAL | 0 refills | Status: AC
Start: 2023-12-05 — End: 2023-12-12

## 2023-12-05 NOTE — Progress Notes (Signed)
 Acute Office Visit  Subjective:     Patient ID: Sarah Young, female    DOB: [birth year], 83 y.o.   MRN: [id number]  Chief Complaint  Patient presents with   Cough    Pt complains of cold symptoms that started last Monday. States of coughing spells, weak, body aches, scratchy throat.      Patient is in today for sick symptoms with a history of HTN. 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  Covid vaccine: original series  Discussed the use of AI scribe software for clinical note transcription with the patient, who gave verbal consent to proceed.  History of Present Illness Sarah Young is an 83 year old female with a history of COVID-19 and blood clots who presents with respiratory symptoms and a positive COVID-19 test.  She began experiencing symptoms a week ago after visiting two doctors' offices. She has a persistent dry cough, which has been present both in the afternoon and throughout the night. Initially, she had a sore, scratchy throat, which has since settled in her chest. She describes difficulty breathing, stating 'it's like I can't breathe.' No significant chest pain outside of the discomfort from coughing.  She feels very fatigued and worn out, with no fever or chills. She has a runny nose and occasional mild headaches. No gastrointestinal symptoms such as nausea, vomiting, diarrhea, or abdominal pain.  Her past medical history includes a previous COVID-19 infection, during which she developed blood clots in her lung and leg. She is currently on Xarelto  for blood thinning. She also takes amitriptyline , amlodipine , ascorbic acid, calcium, vitamin D , B12, and tramadol  as needed for back pain.  She took a COVID-19 test at home on Saturday, which was positive. However, a subsequent test this visit did not appear positive, possibly due to timing. She has allergies to amoxicillin, Augmentin, penicillin, and sulfa  medications.     Review of Systems  Constitutional:  Positive for malaise/fatigue. Negative for chills and fever.  HENT:  Positive for sore throat.   Respiratory:  Positive for cough, sputum production and shortness of breath.   Cardiovascular:  Negative for chest pain.  Gastrointestinal:  Negative for abdominal pain, diarrhea, nausea and vomiting.  Neurological:  Negative for headaches.        Objective:    BP 130/82   Pulse 78   Temp 97.8 F (36.6 C) (Oral)   Ht 5' 3.5 (1.613 m)   Wt 143 lb 9.6 oz (65.1 kg)   SpO2 98%   BMI 25.04 kg/m    Physical Exam Vitals and nursing note reviewed.  Constitutional:      Appearance: Normal appearance.  HENT:     Right Ear: Ear canal and external ear normal. There is impacted cerumen.     Left Ear: Tympanic membrane, ear canal and external ear normal.     Nose:     Right Sinus: Frontal sinus tenderness present. No maxillary sinus tenderness.     Left Sinus: Frontal sinus tenderness present. No maxillary sinus tenderness.     Mouth/Throat:     Mouth: Mucous membranes are moist.     Pharynx: Oropharynx is clear.  Cardiovascular:     Rate and Rhythm: Normal rate and regular rhythm.     Heart sounds: Normal heart sounds.  Pulmonary:     Effort: Pulmonary effort is normal.     Comments: Decreased RUL Lymphadenopathy:     Cervical: No cervical adenopathy.  Neurological:  Mental Status: She is alert.     No results found for any visits on 12/05/23.      Assessment & Plan:   Problem List Items Addressed This Visit   None Visit Diagnoses       Acute cough    -  Primary   Relevant Orders   POC COVID-19 BinaxNow   DG Chest 2 View (Completed)     COVID-19         Decreased breath sounds       Relevant Orders   DG Chest 2 View (Completed)     Shortness of breath       Relevant Orders   DG Chest 2 View (Completed)     Assessment and Plan Assessment & Plan Acute cough with chest congestion and shortness of breath Persistent dry  cough, chest congestion, and shortness of breath for a week. No significant sputum, fever, or wheezing. Adequate oxygen levels, reduced air movement in right upper lung. - Order chest x-ray to evaluate lung condition. - Prescribe doxycycline  100 mg twice daily for 7 days, caution for sun sensitivity. - Prescribe Tessalon  Perles up to three times daily for cough relief. - Advise increased fluid intake to thin secretions. - Encourage rest, avoid prolonged bed rest, recommend light activity as tolerated. - Instruct to report worsening symptoms such as fever or increased shortness of breath.  COVID-19 infection (recent, now possibly resolving) Tested positive for COVID-19, subsequent negative test suggests resolving infection. Symptoms include fatigue and respiratory issues consistent with recent COVID-19. - Monitor symptoms and report any worsening or new symptoms. - Continue supportive care measures as outlined for acute cough and chest congestion.   Meds ordered this encounter  Medications   doxycycline  (VIBRA -TABS) 100 MG tablet    Sig: Take 1 tablet (100 mg total) by mouth 2 (two) times daily for 7 days.    Dispense:  14 tablet    Refill:  0    Supervising Provider:   RANDEEN HARDY A [[id number]]   benzonatate  (TESSALON ) 100 MG capsule    Sig: Take 1 capsule (100 mg total) by mouth 3 (three) times daily as needed.    Dispense:  21 capsule    Refill:  0    Supervising Provider:   RANDEEN HARDY A [[id number]]    Return if symptoms worsen or fail to improve.  Sarah Crandall, NP

## 2023-12-05 NOTE — Telephone Encounter (Signed)
 Copied from CRM [id number]. Topic: Clinical - Lab/Test Results >> Dec 05, 2023  9:47 AM Robinson H wrote: Reason for CRM: Patient would like to discuss her bone density results, please reach out  [facility] [phone]

## 2023-12-05 NOTE — Telephone Encounter (Signed)
 I spoked with patient and she would like to wait to reschedule her appt that she canceled for the bone density results and etc due to her not feeling well at the moment.

## 2023-12-05 NOTE — Patient Instructions (Signed)
 Nice to see you today I have sent in antibiotics and cough medication I will be in touch with the xray once I have it Follow up if you do not improve

## 2023-12-05 NOTE — Telephone Encounter (Signed)
 Per Dr. Randeen pt needs an appt to discuss DEXA and treatment options. Please schedule an appt

## 2023-12-06 ENCOUNTER — Ambulatory Visit: Admitting: Family Medicine

## 2023-12-08 ENCOUNTER — Ambulatory Visit: Payer: Self-pay | Admitting: Nurse Practitioner

## 2023-12-09 ENCOUNTER — Emergency Department
Admission: EM | Admit: 2023-12-09 | Discharge: 2023-12-09 | Disposition: A | Discharge status: Home / Self Care | Source: Ambulatory Visit | Attending: Emergency Medicine | Admitting: Emergency Medicine

## 2023-12-09 ENCOUNTER — Other Ambulatory Visit: Payer: Self-pay

## 2023-12-09 ENCOUNTER — Emergency Department

## 2023-12-09 ENCOUNTER — Ambulatory Visit: Payer: Self-pay

## 2023-12-09 ENCOUNTER — Encounter: Payer: Self-pay | Admitting: Intensive Care

## 2023-12-09 DIAGNOSIS — I493 Ventricular premature depolarization: Secondary | ICD-10-CM | POA: Diagnosis not present

## 2023-12-09 DIAGNOSIS — Z7901 Long term (current) use of anticoagulants: Secondary | ICD-10-CM | POA: Diagnosis not present

## 2023-12-09 DIAGNOSIS — B349 Viral infection, unspecified: Secondary | ICD-10-CM | POA: Diagnosis not present

## 2023-12-09 DIAGNOSIS — R059 Cough, unspecified: Secondary | ICD-10-CM | POA: Diagnosis not present

## 2023-12-09 DIAGNOSIS — R9431 Abnormal electrocardiogram [ECG] [EKG]: Secondary | ICD-10-CM | POA: Diagnosis not present

## 2023-12-09 DIAGNOSIS — Z853 Personal history of malignant neoplasm of breast: Secondary | ICD-10-CM | POA: Diagnosis not present

## 2023-12-09 DIAGNOSIS — Z86718 Personal history of other venous thrombosis and embolism: Secondary | ICD-10-CM | POA: Insufficient documentation

## 2023-12-09 DIAGNOSIS — R0602 Shortness of breath: Secondary | ICD-10-CM | POA: Diagnosis not present

## 2023-12-09 DIAGNOSIS — I700 Atherosclerosis of aorta: Secondary | ICD-10-CM | POA: Insufficient documentation

## 2023-12-09 DIAGNOSIS — R531 Weakness: Secondary | ICD-10-CM | POA: Diagnosis not present

## 2023-12-09 LAB — CBC
HCT: 43.3 % (ref 36.0–46.0)
Hemoglobin: 14 g/dL (ref 12.0–15.0)
MCH: 29.7 pg (ref 26.0–34.0)
MCHC: 32.3 g/dL (ref 30.0–36.0)
MCV: 91.7 fL (ref 80.0–100.0)
Platelets: 225 K/uL (ref 150–400)
RBC: 4.72 MIL/uL (ref 3.87–5.11)
RDW: 13.8 % (ref 11.5–15.5)
WBC: 6.5 K/uL (ref 4.0–10.5)
nRBC: 0 % (ref 0.0–0.2)

## 2023-12-09 LAB — RESP PANEL BY RT-PCR (RSV, FLU A&B, COVID)  RVPGX2
Influenza A by PCR: NEGATIVE
Influenza B by PCR: NEGATIVE
Resp Syncytial Virus by PCR: NEGATIVE
SARS Coronavirus 2 by RT PCR: NEGATIVE

## 2023-12-09 LAB — BASIC METABOLIC PANEL WITH GFR
Anion gap: 10 (ref 5–15)
BUN: 25 mg/dL — ABNORMAL HIGH (ref 8–23)
CO2: 23 mmol/L (ref 22–32)
Calcium: 9 mg/dL (ref 8.9–10.3)
Chloride: 107 mmol/L (ref 98–111)
Creatinine, Ser: 1.07 mg/dL — ABNORMAL HIGH (ref 0.44–1.00)
GFR, Estimated: 52 mL/min — ABNORMAL LOW (ref >60)
Glucose, Bld: 94 mg/dL (ref 70–99)
Potassium: 3.8 mmol/L (ref 3.5–5.1)
Sodium: 140 mmol/L (ref 135–145)

## 2023-12-09 LAB — TROPONIN I (HIGH SENSITIVITY)
Troponin I (High Sensitivity): 3 ng/L (ref <18)
Troponin I (High Sensitivity): 4 ng/L (ref <18)

## 2023-12-09 MED ORDER — ALBUTEROL SULFATE HFA 108 (90 BASE) MCG/ACT IN AERS: 2.0000 | INHALATION_SPRAY | Freq: Four times a day (QID) | RESPIRATORY_TRACT | 2 refills | Status: AC | PRN

## 2023-12-09 MED ORDER — IOHEXOL 350 MG/ML SOLN
75.0000 mL | Freq: Once | INTRAVENOUS | Status: AC | PRN
  Administered 2023-12-09: 75 mL via INTRAVENOUS

## 2023-12-09 NOTE — ED Triage Notes (Signed)
 Seen PCP Monday and given antibiotics for possible pneumonia. Reports she has bad cough. Now experiencing weakness and SOB.

## 2023-12-09 NOTE — ED Provider Notes (Signed)
 [facility] Provider Note    Event Date/Time   First MD Initiated Contact with Patient 12/09/23 1120     (approximate)   History   Shortness of Breath and Weakness   HPI  Sarah Young is a 83 y.o. female who comes in with concerns for cough, shortness of breath, weakness.  Patient reports that on Monday she was given antibiotics for possible pneumonia.  She reports having had a bad cough but lately she has been experiencing more weakness and shortness of breath which prompted her to come into the ER for evaluation.  Patient reports having a chest pressure and shortness of breath.  She reports that the symptoms started this morning.  She reports a little bit of the symptoms yesterday but did get worse this morning.  She is finished the course of doxycycline  today but given her symptoms were initially improving but then starting to feel worse again she called her doctor's office and they told her to come into the ER to be evaluated.  She denies any falls in her head, abdominal pain, swelling in her legs.  She reports being compliant with her Xarelto  for history of blood clot but she reports this feels similar to when she had a blood clot before so is worried about that.  Physical Exam   Triage Vital Signs: ED Triage Vitals  Encounter Vitals Group     BP 12/09/23 1036 (!) 150/85     Girls Systolic BP Percentile --      Girls Diastolic BP Percentile --      Boys Systolic BP Percentile --      Boys Diastolic BP Percentile --      Pulse Rate 12/09/23 1036 60     Resp 12/09/23 1036 (!) 23     Temp 12/09/23 1036 98.5 F (36.9 C)     Temp Source 12/09/23 1036 Oral     SpO2 12/09/23 1036 100 %     Weight 12/09/23 1037 143 lb (64.9 kg)     Height 12/09/23 1037 5' 3.5 (1.613 m)     Head Circumference --      Peak Flow --      Pain Score 12/09/23 1037 0     Pain Loc --      Pain Education --      Exclude from Growth Chart --     Most recent vital  signs: Vitals:   12/09/23 1036  BP: (!) 150/85  Pulse: 60  Resp: (!) 23  Temp: 98.5 F (36.9 C)  SpO2: 100%     General: Awake, no distress.  CV:  Good peripheral perfusion.  Resp:  Normal effort.  Clear lungs Abd:  No distention.  Soft and nontender Other:  No swelling in legs.  No calf tenderness   ED Results / Procedures / Treatments   Labs (all labs ordered are listed, but only abnormal results are displayed) Labs Reviewed  BASIC METABOLIC PANEL WITH GFR - Abnormal; Notable for the following components:      Result Value   BUN 25 (*)    Creatinine, Ser 1.07 (*)    GFR, Estimated 52 (*)    All other components within normal limits  CBC     EKG  My interpretation of EKG:  Sinus rhythm 94 without any ST elevation, occasional PVCs, normal intervals  RADIOLOGY I have reviewed the xray personally and interpreted no evidence of any pneumonia   PROCEDURES:  Critical Care performed:  No  .1-3 Lead EKG Interpretation  Performed by: Ernest Ronal BRAVO, MD Authorized by: Ernest Ronal BRAVO, MD     Interpretation: normal     ECG rate:  60   ECG rate assessment: normal     Rhythm: sinus rhythm     Ectopy: PVCs     Conduction: normal      MEDICATIONS ORDERED IN ED: Medications  iohexol  (OMNIPAQUE ) 350 MG/ML injection 75 mL (75 mLs Intravenous Contrast Given 12/09/23 1225)     IMPRESSION / MDM / ASSESSMENT AND PLAN / ED COURSE  I reviewed the triage vital signs and the nursing notes.   Patient's presentation is most consistent with acute presentation with potential threat to life or bodily function.   Patient comes in with concerns for pneumonia and worsening shortness of breath, chest pressure.  Will get cardiac markers to evaluate for ACS x 2 given symptoms were worsening this morning.  We did discuss CT imaging given she has a history of breast cancer we discussed recurrent CT scans increased risk for reoccurrence versus D-dimer versus observation given she is  already on Xarelto .  Patient reports that this feels very similar to when she had a blood clot and is very worried about it therefore we will proceed with CT imaging given D-dimers in the past have been elevated.  CBC is reassuring.  BMP reassuring.  Troponin was negative  Repeat troponin is negative.  Updated patient on reassuring CT scan.  We discussed additional antibiotics but have opted to hold off given no fever no white count no pneumonia noted on CT scan I suspect this but is probably viral in nature.  She does report being on albuterol  previously and it seemed to help so I will prescribe her some albuterol .  She has a pulmonologist that she can follow-up outpatient.  Given some PVCs noted I will also refer her for cardiology.  She may benefit from a echocardiogram and to make sure that this is not a cardiac cause but at this time her troponins are negative x 2 so no indication for admission to the hospital  The patient is on the cardiac monitor to evaluate for evidence of arrhythmia and/or significant heart rate changes.      FINAL CLINICAL IMPRESSION(S) / ED DIAGNOSES   Final diagnoses:  Shortness of breath  Viral illness     Rx / DC Orders   ED Discharge Orders          Ordered    albuterol  (VENTOLIN  HFA) 108 (90 Base) MCG/ACT inhaler  Every 6 hours PRN        12/09/23 1352    Ambulatory referral to Cardiology        12/09/23 1352             Note:  This document was prepared using Dragon voice recognition software and may include unintentional dictation errors.   Ernest Ronal BRAVO, MD 12/09/23 [phone]

## 2023-12-09 NOTE — Discharge Instructions (Addendum)
 You should call your pulmonologist to make a follow-up appointment and have also referred you to cardiology for a follow-up appointment to try and figure out why you have the shortness of breath.   However this is possible this is still just a virus that your body is fighting off and you should try the albuterol  and see if this helps improve your symptoms and return to the ER for worsening shortness of breath fevers or any other concern

## 2023-12-09 NOTE — Telephone Encounter (Signed)
 Aware, will watch for correspondence Agree with that recommendation   I am out of the office today

## 2023-12-09 NOTE — Telephone Encounter (Signed)
 Continue with plan in office. Eat as she tolerates it. Stay hydrated and we will check on her Monday

## 2023-12-09 NOTE — Telephone Encounter (Signed)
 noted

## 2023-12-09 NOTE — Telephone Encounter (Signed)
 FYI Only or Action Required?: FYI only for provider.  Patient was last seen in primary care on 12/05/2023 by Wendee Lynwood HERO, NP.  Called Nurse Triage reporting Shortness of Breath and Fatigue.  Symptoms began about 2 weeks ago.  Interventions attempted: Prescription medications: bensonatate, doxycycline .  Symptoms are: moderate SOB, moderate to severe fatigue, cough (improving), chest tight/pressure gradually worsening.  Triage Disposition: Go to ED Now (Notify PCP)  Patient/caregiver understands and will follow disposition?: Yes             Copied from CRM #[id number]. Topic: Clinical - Red Word Triage >> Dec 09, 2023  8:52 AM Logan F wrote: Red Word that prompted transfer to Nurse Triage: Started antibiotics Monday for pneumonia. The cough is better but is very weak and extreme fatigue. Hands are very shakey. She can barely walk out of the shower without having to lay down. Feeling short of breath. Reason for Disposition  [1] MODERATE difficulty breathing (e.g., speaks in phrases, SOB even at rest, pulse 100-120) AND [2] NEW-onset or WORSE than normal  Answer Assessment - Initial Assessment Questions 1. RESPIRATORY STATUS: Describe your breathing? (e.g., wheezing, shortness of breath, unable to speak, severe coughing)      Shortness of breath worsened since being seen on Monday 12/05/23, describes it as tightness or squeezing on her lungs. Improved cough.  2. ONSET: When did this breathing problem begin?      About 2 weeks. Worsened the last couple of days.  3. PATTERN Does the difficult breathing come and go, or has it been constant since it started?      Constant. Worse when exerting herself.  4. SEVERITY: How bad is your breathing? (e.g., mild, moderate, severe)      Moderate to severe. She states she feels SOB while sitting and talking with triage. Patient speaking in full sentences but does stop after speaking to take a deep breath.  5. RECURRENT SYMPTOM: Have you  had difficulty breathing before? If Yes, ask: When was the last time? and What happened that time?      Yes, she states had COVID and blood clots in her leg and lungs about 3 years ago. She states she is still on a blood thinner for it.  6. CARDIAC HISTORY: Do you have any history of heart disease? (e.g., heart attack, angina, bypass surgery, angioplasty)      No.  7. LUNG HISTORY: Do you have any history of lung disease?  (e.g., pulmonary embolus, asthma, emphysema)     Pulmonary embolus.  8. CAUSE: What do you think is causing the breathing problem?      She states she thinks it is pneumonia but is concerned to make sure it is not a blood clot in her lungs.  9. OTHER SYMPTOMS: Do you have any other symptoms? (e.g., chest pain, cough, dizziness, fever, runny nose)     Moderate to severe fatigue/weakness, cough, chest feels like pressure and tightness.  10. O2 SATURATION MONITOR:  Do you use an oxygen saturation monitor (pulse oximeter) at home? If Yes, ask: What is your reading (oxygen level) today? What is your usual oxygen saturation reading? (e.g., 95%)       No.  11. PREGNANCY: Is there any chance you are pregnant? When was your last menstrual period?       N/A.  12. TRAVEL: Have you traveled out of the country in the last month? (e.g., travel history, exposures)       No.  Protocols used:  Breathing Difficulty-A-AH

## 2023-12-12 ENCOUNTER — Telehealth: Payer: Self-pay | Admitting: Nurse Practitioner

## 2023-12-12 NOTE — Telephone Encounter (Signed)
Can we see how she is feeling?

## 2023-12-12 NOTE — Telephone Encounter (Signed)
 Called and spoke with patient.  See other encounter in result notes.

## 2023-12-12 NOTE — Telephone Encounter (Signed)
-----   Message from [facility] sent at 12/09/2023  4:52 PM EDT ----- Regarding: weakness See how she is feeling

## 2023-12-14 ENCOUNTER — Encounter: Payer: Self-pay | Admitting: Family Medicine

## 2023-12-14 ENCOUNTER — Ambulatory Visit: Admitting: Family Medicine

## 2023-12-14 VITALS — BP 128/70 | HR 79 | Temp 97.8°F | Ht 63.5 in | Wt 143.0 lb

## 2023-12-14 DIAGNOSIS — R0789 Other chest pain: Secondary | ICD-10-CM

## 2023-12-14 DIAGNOSIS — Z8616 Personal history of COVID-19: Secondary | ICD-10-CM

## 2023-12-14 DIAGNOSIS — M810 Age-related osteoporosis without current pathological fracture: Secondary | ICD-10-CM

## 2023-12-14 MED ORDER — PREDNISONE 10 MG PO TABS: ORAL_TABLET | ORAL | 0 refills | Status: DC

## 2023-12-14 NOTE — Progress Notes (Signed)
 Subjective:    Patient ID: Sarah Young, female    DOB: [birth year], 83 y.o.   MRN: [id number]  HPI  Wt Readings from Last 3 Encounters:  12/14/23 143 lb (64.9 kg)  12/09/23 143 lb (64.9 kg)  12/05/23 143 lb 9.6 oz (65.1 kg)   24.93 kg/m  Vitals:   12/14/23 1148  BP: 128/70  Pulse: 79  Temp: 97.8 F (36.6 C)  SpO2: 100%     Pt presents for follow up of  Chest tightness/ shortness of breath /following positive covid test  Osteoporosis  Seen in ER on 12/09/23  Monday prior to that was seen and treated for possible pneumonia (with positive covid test)  Pulse ox was 98% Covid test in office was negative  Cxr was clear  Doxycycline   Tessalon  pearles    Shortness of breath worsened and she presented to ER on 9/12  Still having chest pressure  Able to catch her breath and breathe ok  Pulse ox is 100%   Still some cough- not as bad  Occational phlegm but mostly dry  Usually mucous is clear  No nasal symptoms - that is better  Ears and throat are ok   Still very tired  Tight chest  Not a lot of wheezing   No ankle swelling   Stressors Brother is dying of cancer    Lab Results  Component Value Date   NA 140 12/09/2023   K 3.8 12/09/2023   CO2 23 12/09/2023   GLUCOSE 94 12/09/2023   BUN 25 (H) 12/09/2023   CREATININE 1.07 (H) 12/09/2023   CALCIUM 9.0 12/09/2023   GFR 42.83 (L) 06/20/2023   EGFR 40 (L) 02/03/2017   GFRNONAA 52 (L) 12/09/2023   Lab Results  Component Value Date   WBC 6.5 12/09/2023   HGB 14.0 12/09/2023   HCT 43.3 12/09/2023   MCV 91.7 12/09/2023   PLT 225 12/09/2023   Normal troponins  EKG nsr with some pvcs Viral swabs neg for RSV, flu and covid   CTA reassuring no PE or abn CT Angio Chest PE W and/or Wo Contrast (Accession [id number]) (Order [id number]) Imaging Date: 12/09/2023 Department: [facility] Released By/Authorizing: Ernest Ronal BRAVO, MD (auto-released)   Exam  Status  Status  Final [99]   PACS Intelerad Image Link   Show images for CT Angio Chest PE W and/or Wo Contrast Study Result  Narrative & Impression  CLINICAL DATA:  Cough, weakness, shortness of breath, pulmonary embolism suspected   EXAM: CT ANGIOGRAPHY CHEST WITH CONTRAST   TECHNIQUE: Multidetector CT imaging of the chest was performed using the standard protocol during bolus administration of intravenous contrast. Multiplanar CT image reconstructions and MIPs were obtained to evaluate the vascular anatomy.   RADIATION DOSE REDUCTION: This exam was performed according to the departmental dose-optimization program which includes automated exposure control, adjustment of the mA and/or kV according to patient size and/or use of iterative reconstruction technique.   CONTRAST:  75mL OMNIPAQUE  IOHEXOL  350 MG/ML SOLN   COMPARISON:  Same day chest radiograph and prior studies   FINDINGS: Cardiovascular: Satisfactory opacification of the pulmonary arteries to the segmental level. No evidence of pulmonary embolism. No pericardial effusion. Scattered aortic calcifications.   Mediastinum/Nodes: No lymphadenopathy.   Lungs/Pleura: Unchanged mild bibasilar scarring/atelectasis. No focal consolidations. No pleural effusion or pneumothorax.   Upper Abdomen: No acute findings.   Musculoskeletal: No acute osseous findings.   Review of the MIP images confirms the  above findings.   IMPRESSION: 1. No pulmonary embolism identified. No acute intrathoracic pathology.   Aortic Atherosclerosis (ICD10-I70.0).    Currently takes xarelto  for past history of PE with covid    Patient Active Problem List   Diagnosis Date Noted   Chest tightness 12/14/2023   History of COVID-19 12/14/2023   Hemorrhoids, external 11/09/2022   Medication intolerance 07/26/2022   Recurrent epistaxis 05/27/2022   Herpes zoster 02/02/2021   Medicare annual wellness visit, subsequent 08/14/2020    Aortic atherosclerosis (HCC) 08/23/2019   Hypercoagulopathy (HCC) 05/22/2019   History of pulmonary embolism 05/07/2019   Osteoporosis 05/02/2018   Routine general medical examination at a health care facility 11/21/2016   Estrogen deficiency 11/19/2016   Pedal edema 12/15/2015   Family history of breast cancer in female 05/17/2014   History of breast cancer 05/08/2014   GERD (gastroesophageal reflux disease) 03/01/2014   Lumbar degenerative disc disease 08/15/2013   Chronic pain of scapula 08/15/2013   Essential hypertension 10/02/2008   Migraine 09/06/2008   Vitamin D  deficiency 06/27/2007   HYPERLIPIDEMIA 05/11/2007   Generalized anxiety disorder 05/11/2007   Disorder of bone and cartilage 05/11/2007   H/O poliomyelitis 05/08/2007   OSTEOARTHRITIS, HANDS, BILATERAL 05/08/2007   Fibromyalgia 05/08/2007   URINARY INCONTINENCE, STRESS, MILD 05/08/2007   Past Medical History:  Diagnosis Date   Anxiety    Arthritis    OA of hands   Breast cancer of upper-outer quadrant of right female breast (HCC) 05/08/2014   DVT (deep venous thrombosis) (HCC) 1980s   Fibromyalgia    Hyperlipidemia    Hypertension    Irritable bowel syndrome    Migraine    Osteopenia    Personal history of radiation therapy    Pulmonary embolus (HCC) 2021   S/P radiation therapy 06/2014   Right breast 4250 cGy in 17 sessions, right breast boost 1200 cGy in 6 sessions  = 07/11/2014 through 08/12/2014                         Vitamin D  deficiency    Wears glasses    Past Surgical History:  Procedure Laterality Date   ABDOMINAL HYSTERECTOMY  1972   APPENDECTOMY     BREAST EXCISIONAL BIOPSY Left    BREAST LUMPECTOMY Right 2016   BREAST SURGERY  1995   lumpectomy fibrocystic breast-lt   CATARACT EXTRACTION     both   CHOLECYSTECTOMY  2005   COLONOSCOPY     ovarian cyst removed  1985   RADIOACTIVE SEED GUIDED PARTIAL MASTECTOMY WITH AXILLARY SENTINEL LYMPH NODE BIOPSY Right 05/30/2014   Procedure:  RADIOACTIVE SEED GUIDED PARTIAL MASTECTOMY WITH AXILLARY SENTINEL LYMPH NODE BIOPSY;  Surgeon: Deward Null III, MD;  Location: [facility];  Service: General;  Laterality: Right;   Social History   Tobacco Use   Smoking status: Never   Smokeless tobacco: Never   Tobacco comments:    non smoker  Vaping Use   Vaping status: Never Used  Substance Use Topics   Alcohol use: No    Alcohol/week: 0.0 standard drinks of alcohol   Drug use: No   Family History  Problem Relation Age of Onset   Hypertension Father    Cancer Father 31       esophageal - smoker   Leukemia Mother    Cancer Mother 4       leukemia   Cancer Brother 65       breast CA  Breast cancer Brother 59   Cancer Maternal Aunt 35       leukemia   Cancer Paternal Aunt 58       breast   Breast cancer Paternal Aunt 76   Breast cancer Maternal Grandmother 68   Breast cancer Sister 72   Allergies  Allergen Reactions   Amoxicillin Itching and Other (See Comments)     Paradoxical Reaction- made infection worse    Amoxicillin-Pot Clavulanate Other (See Comments)    Felt like body turned inside out   Esomeprazole  Magnesium  Nausea And Vomiting   Gabapentin  Other (See Comments)    Caused nightmares   Omeprazole Nausea And Vomiting   Other Itching, Swelling and Other (See Comments)    Ant venom = Made the site (where bitten) swell   Penicillins Other (See Comments)   Ranitidine Hcl Other (See Comments)    Not effective   Sulfa  Antibiotics    Wasp Venom Swelling and Other (See Comments)    Made leg swell where stung   Current Outpatient Medications on File Prior to Visit  Medication Sig Dispense Refill   acetaminophen  (TYLENOL ) 500 MG tablet Take 1,000 mg by mouth every 6 (six) hours as needed for mild pain (or headaches).     albuterol  (VENTOLIN  HFA) 108 (90 Base) MCG/ACT inhaler Inhale 2 puffs into the lungs every 6 (six) hours as needed for wheezing or shortness of breath. 8 g 2   amitriptyline   (ELAVIL ) 10 MG tablet TAKE 2 TABLETS BY MOUTH AT BEDTIME. 180 tablet 3   amLODipine  (NORVASC ) 5 MG tablet TAKE 1 TABLET (5 MG TOTAL) BY MOUTH DAILY. 90 tablet 2   Ascorbic Acid (VITAMIN C PO) Take 1 tablet by mouth every morning.     benzonatate  (TESSALON ) 100 MG capsule Take 1 capsule (100 mg total) by mouth 3 (three) times daily as needed. 21 capsule 0   Calcium Carb-Cholecalciferol (CALCIUM + D3 PO) Take 1 tablet by mouth in the morning.     Cholecalciferol (VITAMIN D -3) 25 MCG (1000 UT) CAPS Take 1,000 Units by mouth daily.     Cyanocobalamin (VITAMIN B-12 PO) Take 1 tablet by mouth every morning.     hydrocortisone  (ANUSOL -HC) 2.5 % rectal cream Place 1 Application rectally 2 (two) times daily. To affected area/hemorrhoids for 10 days maximum 30 g 0   Multiple Vitamins-Minerals (ZINC PO) Take 1 tablet by mouth 2 (two) times a week.     Polyethyl Glycol-Propyl Glycol (SYSTANE) 0.4-0.3 % SOLN Place 1 drop into both eyes 2 (two) times daily.     SUMAtriptan  (IMITREX ) 100 MG tablet TAKE 1 TABLET BY MOUTH AS NEEDED FOR MIGRAINE, MAY REPEAT IN 2 HOURS IF NEEDED. MAX 2 TABS IN 24 HOURS. 9 tablet 11   traMADol  (ULTRAM ) 50 MG tablet Take 50 mg by mouth every 6 (six) hours as needed.     XARELTO  20 MG TABS tablet TAKE 1 TABLET BY MOUTH DAILY WITH SUPPER. 90 tablet 3   No current facility-administered medications on file prior to visit.    Review of Systems  Constitutional:  Positive for fatigue. Negative for activity change, appetite change, fever and unexpected weight change.  HENT:  Negative for congestion, ear pain, rhinorrhea, sinus pressure, sore throat, trouble swallowing and voice change.   Eyes:  Negative for pain, redness and visual disturbance.  Respiratory:  Positive for cough. Negative for choking, shortness of breath and wheezing.   Cardiovascular:  Positive for chest pain. Negative for palpitations and leg swelling.  Chest tightness (not pain) for 3 weeks    Gastrointestinal:   Negative for abdominal pain, blood in stool, constipation and diarrhea.  Endocrine: Negative for polydipsia and polyuria.  Genitourinary:  Negative for dysuria, frequency and urgency.  Musculoskeletal:  Negative for arthralgias, back pain and myalgias.  Skin:  Negative for pallor and rash.  Allergic/Immunologic: Negative for environmental allergies.  Neurological:  Negative for dizziness, syncope and headaches.  Hematological:  Negative for adenopathy. Does not bruise/bleed easily.  Psychiatric/Behavioral:  Negative for decreased concentration and dysphoric mood. The patient is nervous/anxious.        Objective:   Physical Exam Constitutional:      General: She is not in acute distress.    Appearance: Normal appearance. She is well-developed and normal weight. She is not ill-appearing or diaphoretic.  HENT:     Head: Normocephalic and atraumatic.     Right Ear: Tympanic membrane and ear canal normal.     Left Ear: Tympanic membrane and ear canal normal.     Nose: Nose normal. No congestion or rhinorrhea.     Mouth/Throat:     Mouth: Mucous membranes are moist.     Pharynx: Oropharynx is clear.  Eyes:     Conjunctiva/sclera: Conjunctivae normal.     Pupils: Pupils are equal, round, and reactive to light.  Neck:     Thyroid : No thyromegaly.     Vascular: No carotid bruit or JVD.  Cardiovascular:     Rate and Rhythm: Normal rate and regular rhythm.     Heart sounds: Normal heart sounds.     No gallop.  Pulmonary:     Effort: Pulmonary effort is normal. No respiratory distress.     Breath sounds: Normal breath sounds. No stridor. No wheezing, rhonchi or rales.     Comments: Harsh bs No wheeze No rales/rhonchi No crackles Good air exchange   Not shortness of breath with speech or ambulation   Mild anterior chest tenderness  Chest:     Chest wall: Tenderness present.  Abdominal:     General: There is no distension or abdominal bruit.     Palpations: Abdomen is soft.   Musculoskeletal:     Cervical back: Normal range of motion and neck supple.     Right lower leg: No edema.     Left lower leg: No edema.     Comments: No LE edema or tenderness or palp cord   Lymphadenopathy:     Cervical: No cervical adenopathy.  Skin:    General: Skin is warm and dry.     Coloration: Skin is not pale.     Findings: No rash.  Neurological:     Mental Status: She is alert.     Coordination: Coordination normal.     Deep Tendon Reflexes: Reflexes are normal and symmetric. Reflexes normal.  Psychiatric:        Mood and Affect: Mood is anxious.           Assessment & Plan:   Problem List Items Addressed This Visit       Musculoskeletal and Integument   Osteoporosis   Previously on tamoxifen    Needs treatment for osteoporosis  Discussed alendronate and handout given No new falls or fractures  Is unable to get spine surgery due to above  Waiting for current chest symptoms to improve before staring treatment         Other   History of COVID-19   Distant past covid plus PE More  recent covid test positive 3 wk ago with cough - cough is improved but still fatigued and chest pressure Last 2 tests here negative Reviewed hospital records, lab results and studies in detail   Reassuring CTA        Relevant Orders   Ambulatory referral to Cardiology   Chest tightness - Primary   Feeling of chest tightness since positive covid test 3 wk ago  Cough -improving , also fatigue Reviewed visit here with NP Wendee Also ER visit Reviewed hospital records, lab results and studies in detail   Reassuring hospital work up including CTA (no PE) Pt takes xarelto  for history of PE as well and no missed doses   Last cardiology visit 2021 , had mild DD in past   Reassuring exam with pulse ox 100% and no shortness of breath with speech  No ankle edema   Urgent referral done to cardiology   Call back and Er precautions noted in detail today    Prescription  prednisone  to see if helps with tightness        Relevant Orders   Ambulatory referral to Cardiology

## 2023-12-14 NOTE — Assessment & Plan Note (Signed)
 Previously on tamoxifen    Needs treatment for osteoporosis  Discussed alendronate and handout given No new falls or fractures  Is unable to get spine surgery due to above  Waiting for current chest symptoms to improve before staring treatment

## 2023-12-14 NOTE — Patient Instructions (Addendum)
 I put the referral in for cardiology Please let us  know if you don't hear in 1-2 weeks to set that up (mychart message or call or letter)  You can also call their office    Take prednisone  to see if it helps with chest tightness  It can cause hunger and hyper feeling   Read about alendronate  This would be the first medicine for osteoporosis   I would like to start it once chest symptoms are better   If symptoms worsen in the meantime or if short of breath- go back to the ER and let us  know

## 2023-12-14 NOTE — Assessment & Plan Note (Signed)
 Distant past covid plus PE More recent covid test positive 3 wk ago with cough - cough is improved but still fatigued and chest pressure Last 2 tests here negative Reviewed hospital records, lab results and studies in detail   Reassuring CTA

## 2023-12-14 NOTE — Assessment & Plan Note (Addendum)
 Feeling of chest tightness since positive covid test 3 wk ago  Cough -improving , also fatigue Reviewed visit here with NP Cable Also ER visit Reviewed hospital records, lab results and studies in detail   Reassuring hospital work up including CTA (no PE) Pt takes xarelto  for history of PE as well and no missed doses   Last cardiology visit 2021 , had mild DD in past   Reassuring exam with pulse ox 100% and no shortness of breath with speech  No ankle edema   Urgent referral done to cardiology   Call back and Er precautions noted in detail today    Prescription prednisone  to see if helps with tightness

## 2023-12-15 ENCOUNTER — Telehealth: Payer: Self-pay

## 2023-12-15 ENCOUNTER — Telehealth: Payer: Self-pay | Admitting: *Deleted

## 2023-12-15 DIAGNOSIS — I10 Essential (primary) hypertension: Secondary | ICD-10-CM

## 2023-12-15 NOTE — Patient Outreach (Signed)
 EMMI referral received, patient contacted to assess for community support needs. Patient denied having any needs at this time, Patient encouraged to contact this social worker if any additional needs should arise.   Erza Mothershead, LCSW [place]  [facility], [facility] Licensed Clinical Social Worker  Direct Dial: [phone]

## 2023-12-16 ENCOUNTER — Ambulatory Visit: Admitting: Cardiovascular Disease

## 2023-12-16 NOTE — Progress Notes (Deleted)
 Cardiology Office Note  Date:  12/16/2023   ID:  Sarah Young, DOB 01-17-1941, MRN [id number]  PCP:  Randeen Laine LABOR, MD   No chief complaint on file.   HPI:  Sarah Young a 83 y.o. femalewith past medical history of:  Who presents by referral from Dr. Randeen for history of COVID-19, chest tightness   Chest pressure for 3 weeks since testing positive for covid Past mild diastolic dysfunction on last echo  Remote history of PE -on xarelto  and recent CT chest was clear   Prior echo May 2021 EF 55 to 60% normal RV size and function No significant valvular heart disease Unchanged from January 2021  CT scan chest September 2025 Aortic atherosclerosis documented   PMH:   has a past medical history of Anxiety, Arthritis, Breast cancer of upper-outer quadrant of right female breast (HCC) (05/08/2014), DVT (deep venous thrombosis) (HCC) (1980s), Fibromyalgia, Hyperlipidemia, Hypertension, Irritable bowel syndrome, Migraine, Osteopenia, Personal history of radiation therapy, Pulmonary embolus (HCC) (2021), S/P radiation therapy (06/2014), Vitamin D  deficiency, and Wears glasses.  PSH:    Past Surgical History:  Procedure Laterality Date   ABDOMINAL HYSTERECTOMY  1972   APPENDECTOMY     BREAST EXCISIONAL BIOPSY Left    BREAST LUMPECTOMY Right 2016   BREAST SURGERY  1995   lumpectomy fibrocystic breast-lt   CATARACT EXTRACTION     both   CHOLECYSTECTOMY  2005   COLONOSCOPY     ovarian cyst removed  1985   RADIOACTIVE SEED GUIDED PARTIAL MASTECTOMY WITH AXILLARY SENTINEL LYMPH NODE BIOPSY Right 05/30/2014   Procedure: RADIOACTIVE SEED GUIDED PARTIAL MASTECTOMY WITH AXILLARY SENTINEL LYMPH NODE BIOPSY;  Surgeon: Deward Null III, MD;  Location: [facility];  Service: General;  Laterality: Right;    Current Outpatient Medications  Medication Sig Dispense Refill   acetaminophen  (TYLENOL ) 500 MG tablet Take 1,000 mg by mouth every 6 (six) hours as needed for  mild pain (or headaches).     albuterol  (VENTOLIN  HFA) 108 (90 Base) MCG/ACT inhaler Inhale 2 puffs into the lungs every 6 (six) hours as needed for wheezing or shortness of breath. 8 g 2   amitriptyline  (ELAVIL ) 10 MG tablet TAKE 2 TABLETS BY MOUTH AT BEDTIME. 180 tablet 3   amLODipine  (NORVASC ) 5 MG tablet TAKE 1 TABLET (5 MG TOTAL) BY MOUTH DAILY. 90 tablet 2   Ascorbic Acid (VITAMIN C PO) Take 1 tablet by mouth every morning.     benzonatate  (TESSALON ) 100 MG capsule Take 1 capsule (100 mg total) by mouth 3 (three) times daily as needed. 21 capsule 0   Calcium Carb-Cholecalciferol (CALCIUM + D3 PO) Take 1 tablet by mouth in the morning.     Cholecalciferol (VITAMIN D -3) 25 MCG (1000 UT) CAPS Take 1,000 Units by mouth daily.     Cyanocobalamin (VITAMIN B-12 PO) Take 1 tablet by mouth every morning.     hydrocortisone  (ANUSOL -HC) 2.5 % rectal cream Place 1 Application rectally 2 (two) times daily. To affected area/hemorrhoids for 10 days maximum 30 g 0   Multiple Vitamins-Minerals (ZINC PO) Take 1 tablet by mouth 2 (two) times a week.     Polyethyl Glycol-Propyl Glycol (SYSTANE) 0.4-0.3 % SOLN Place 1 drop into both eyes 2 (two) times daily.     predniSONE  (DELTASONE ) 10 MG tablet Take 4 pills once daily by mouth for 3 days, then 3 pills daily for 3 days, then 2 pills daily for 3 days then 1 pill daily for 3  days then stop 30 tablet 0   SUMAtriptan  (IMITREX ) 100 MG tablet TAKE 1 TABLET BY MOUTH AS NEEDED FOR MIGRAINE, MAY REPEAT IN 2 HOURS IF NEEDED. MAX 2 TABS IN 24 HOURS. 9 tablet 11   traMADol  (ULTRAM ) 50 MG tablet Take 50 mg by mouth every 6 (six) hours as needed.     XARELTO  20 MG TABS tablet TAKE 1 TABLET BY MOUTH DAILY WITH SUPPER. 90 tablet 3   No current facility-administered medications for this visit.     Allergies:   Amoxicillin, Amoxicillin-pot clavulanate, Esomeprazole  magnesium , Gabapentin , Omeprazole, Other, Penicillins, Ranitidine hcl, Sulfa  antibiotics, and Wasp venom    Social History:  The patient  reports that she has never smoked. She has never used smokeless tobacco. She reports that she does not drink alcohol and does not use drugs.   Family History:   family history includes Breast cancer (age of onset: 73) in her brother; Breast cancer (age of onset: 67) in her paternal aunt; Breast cancer (age of onset: 98) in her sister; Breast cancer (age of onset: 33) in her maternal grandmother; Cancer (age of onset: 51) in her brother; Cancer (age of onset: 28) in her father; Cancer (age of onset: 56) in her paternal aunt; Cancer (age of onset: 33) in her maternal aunt; Cancer (age of onset: 22) in her mother; Hypertension in her father; Leukemia in her mother.    Review of Systems: ROS   PHYSICAL EXAM: VS:  There were no vitals taken for this visit. , BMI There is no height or weight on file to calculate BMI. GEN: Well nourished, well developed, in no acute distress HEENT: normal Neck: no JVD, carotid bruits, or masses Cardiac: RRR; no murmurs, rubs, or gallops,no edema  Respiratory:  clear to auscultation bilaterally, normal work of breathing GI: soft, nontender, nondistended, + BS MS: no deformity or atrophy Skin: warm and dry, no rash Neuro:  Strength and sensation are intact Psych: euthymic mood, full affect    Recent Labs: 06/20/2023: ALT 12; TSH 2.50 12/09/2023: BUN 25; Creatinine, Ser 1.07; Hemoglobin 14.0; Platelets 225; Potassium 3.8; Sodium 140    Lipid Panel Lab Results  Component Value Date   CHOL 244 (H) 06/20/2023   HDL 71.30 06/20/2023   LDLCALC 153 (H) 06/20/2023   TRIG 97.0 06/20/2023      Wt Readings from Last 3 Encounters:  12/14/23 143 lb (64.9 kg)  12/09/23 143 lb (64.9 kg)  12/05/23 143 lb 9.6 oz (65.1 kg)       ASSESSMENT AND PLAN:  Problem List Items Addressed This Visit   None    Disposition:   F/U  12 months   Total encounter time more than 30 minutes  Greater than 50% was spent in counseling and  coordination of care with the patient    Signed, Velinda Lunger, M.D., Ph.D. [facility] [place], [place] [phone]

## 2023-12-22 NOTE — Progress Notes (Signed)
 Cardiology Office Note Date:  01/02/2024  ID:  Sarah Young, DOB [birth year], MRN [id number] PCP:  Randeen Laine LABOR, MD  Cardiologist:  Joelle VEAR Ren Donley, MD  Chief Complaint  Patient presents with   Shortness of Breath      Problems CP following recent COVID infection 9/25 DVT/PE (following COVID in 2021) on Xarelto , recent CTA 11/2023 negative HTN/HLD on AE Aortic atherosclerosis   Visits  12/2023:     History of Present Illness: Sarah Young is a 83 y.o. female who presents for CP and dyspnea.  She had an episode of COVID back in September.  And since then she has been having worsening dyspnea with some chest tightness.  It sounds nonexertional and seems to occur even at rest.  She was trialed on steroids and it seems like it helped but she is still having symptoms.  She denies any lower extremity edema, PND, orthopnea.  She has been adherent to her Xarelto .  She has been able to do her daily activities without issues.    ROS: Please see the history of present illness. All other systems are reviewed and negative.   Past Medical History:  Diagnosis Date   Anxiety    Arthritis    OA of hands   Breast cancer of upper-outer quadrant of right female breast (HCC) 05/08/2014   DVT (deep venous thrombosis) (HCC) 1980s   Fibromyalgia    Hyperlipidemia    Hypertension    Irritable bowel syndrome    Migraine    Osteopenia    Personal history of radiation therapy    Pulmonary embolus (HCC) 2021   S/P radiation therapy 06/2014   Right breast 4250 cGy in 17 sessions, right breast boost 1200 cGy in 6 sessions  = 07/11/2014 through 08/12/2014                         Vitamin D  deficiency    Wears glasses     Past Surgical History:  Procedure Laterality Date   ABDOMINAL HYSTERECTOMY  1972   APPENDECTOMY     BREAST EXCISIONAL BIOPSY Left    BREAST LUMPECTOMY Right 2016   BREAST SURGERY  1995   lumpectomy fibrocystic breast-lt   CATARACT EXTRACTION      both   CHOLECYSTECTOMY  2005   COLONOSCOPY     ovarian cyst removed  1985   RADIOACTIVE SEED GUIDED PARTIAL MASTECTOMY WITH AXILLARY SENTINEL LYMPH NODE BIOPSY Right 05/30/2014   Procedure: RADIOACTIVE SEED GUIDED PARTIAL MASTECTOMY WITH AXILLARY SENTINEL LYMPH NODE BIOPSY;  Surgeon: Deward Null III, MD;  Location: [facility];  Service: General;  Laterality: Right;    Current Outpatient Medications  Medication Sig Dispense Refill   acetaminophen  (TYLENOL ) 500 MG tablet Take 1,000 mg by mouth every 6 (six) hours as needed for mild pain (or headaches).     albuterol  (VENTOLIN  HFA) 108 (90 Base) MCG/ACT inhaler Inhale 2 puffs into the lungs every 6 (six) hours as needed for wheezing or shortness of breath. 8 g 2   amitriptyline  (ELAVIL ) 10 MG tablet TAKE 2 TABLETS BY MOUTH AT BEDTIME. 180 tablet 3   amLODipine  (NORVASC ) 5 MG tablet TAKE 1 TABLET (5 MG TOTAL) BY MOUTH DAILY. 90 tablet 2   Ascorbic Acid (VITAMIN C PO) Take 1 tablet by mouth every morning.     benzonatate  (TESSALON ) 100 MG capsule Take 1 capsule (100 mg total) by mouth 3 (three) times daily as  needed. 21 capsule 0   Calcium Carb-Cholecalciferol (CALCIUM + D3 PO) Take 1 tablet by mouth in the morning.     Cholecalciferol (VITAMIN D -3) 25 MCG (1000 UT) CAPS Take 1,000 Units by mouth daily.     Cyanocobalamin (VITAMIN B-12 PO) Take 1 tablet by mouth every morning.     hydrocortisone  (ANUSOL -HC) 2.5 % rectal cream Place 1 Application rectally 2 (two) times daily. To affected area/hemorrhoids for 10 days maximum 30 g 0   Multiple Vitamins-Minerals (ZINC PO) Take 1 tablet by mouth 2 (two) times a week.     Polyethyl Glycol-Propyl Glycol (SYSTANE) 0.4-0.3 % SOLN Place 1 drop into both eyes 2 (two) times daily.     rosuvastatin (CRESTOR) 10 MG tablet Take 1 tablet (10 mg total) by mouth daily. 90 tablet 3   SUMAtriptan  (IMITREX ) 100 MG tablet TAKE 1 TABLET BY MOUTH AS NEEDED FOR MIGRAINE, MAY REPEAT IN 2 HOURS IF NEEDED. MAX 2  TABS IN 24 HOURS. 9 tablet 11   traMADol  (ULTRAM ) 50 MG tablet Take 50 mg by mouth every 6 (six) hours as needed.     XARELTO  20 MG TABS tablet TAKE 1 TABLET BY MOUTH DAILY WITH SUPPER. 90 tablet 3   predniSONE  (DELTASONE ) 10 MG tablet Take 4 pills once daily by mouth for 3 days, then 3 pills daily for 3 days, then 2 pills daily for 3 days then 1 pill daily for 3 days then stop 30 tablet 0   No current facility-administered medications for this visit.    Allergies:   Amoxicillin, Amoxicillin-pot clavulanate, Esomeprazole  magnesium , Gabapentin , Omeprazole, Other, Penicillins, Ranitidine hcl, Sulfa  antibiotics, and Wasp venom   Social History:  Noncontributory  Family History:  Noncontributory  PHYSICAL EXAM: VS:  BP (!) 114/54 (BP Location: Left Arm, Patient Position: Sitting, Cuff Size: Normal)   Pulse 83   Ht 5' 3.5 (1.613 m)   Wt 144 lb (65.3 kg)   SpO2 98%   BMI 25.11 kg/m  , BMI Body mass index is 25.11 kg/m. GEN: Well nourished, well developed, in no acute distress HEENT: normal Neck: no JVD, carotid bruits, or masses Cardiac: RRR; no murmurs, rubs, or gallops,no edema  Respiratory:  CTAB bilaterally, normal work of breathing GI: soft, nontender, nondistended, + BS Extremities: No LE edema Skin: warm and dry, no rash Neuro:  Strength and sensation are intact  EKG: NSR w/ PVCs  Recent Labs: Reviewed   Studies: Reviewed  ASSESSMENT AND PLAN: Sarah Young is a 83 y.o. female who presents for chest tightness and DOE. - She is presenting with persistent dyspnea and chest tightness since her COVID diagnosis back in September.  It did improve with steroids but she continues to have it.  It does not sound like an anginal equivalent.  Given that she had clots following her last COVID diagnosis, I will order DVT ultrasound to screen for clots.  I suspect this is not the cause of her symptoms given that she was on anticoagulation.  I will also order a TTE for further  evaluation. - Given high PVC burden on the last EKG, I will order a 72-hour Zio patch to quantify PVC burden. - I also started her on rosuvastatin 10 mg given elevated ASCVD risk. - Will see her back in about 2 months and if current workup is unremarkable and persistent symptoms, we will proceed with nuclear stress test.   Signed, Joelle VEAR Ren Donley, MD  01/02/2024 9:29 AM    [facility]

## 2024-01-02 ENCOUNTER — Ambulatory Visit

## 2024-01-02 VITALS — BP 114/54 | HR 83 | Ht 63.5 in | Wt 144.0 lb

## 2024-01-02 DIAGNOSIS — E785 Hyperlipidemia, unspecified: Secondary | ICD-10-CM

## 2024-01-02 DIAGNOSIS — I493 Ventricular premature depolarization: Secondary | ICD-10-CM

## 2024-01-02 DIAGNOSIS — I10 Essential (primary) hypertension: Secondary | ICD-10-CM

## 2024-01-02 DIAGNOSIS — I700 Atherosclerosis of aorta: Secondary | ICD-10-CM

## 2024-01-02 DIAGNOSIS — R0789 Other chest pain: Secondary | ICD-10-CM | POA: Diagnosis not present

## 2024-01-02 DIAGNOSIS — R0602 Shortness of breath: Secondary | ICD-10-CM

## 2024-01-02 DIAGNOSIS — Z86711 Personal history of pulmonary embolism: Secondary | ICD-10-CM

## 2024-01-02 DIAGNOSIS — Z8616 Personal history of COVID-19: Secondary | ICD-10-CM | POA: Diagnosis not present

## 2024-01-02 MED ORDER — ROSUVASTATIN CALCIUM 10 MG PO TABS: 10.0000 mg | ORAL_TABLET | Freq: Every day | ORAL | 3 refills | Status: AC

## 2024-01-02 NOTE — Progress Notes (Unsigned)
 Enrolled for [employer] to mail a ZIO XT long term holter monitor to the patients address on file.

## 2024-01-02 NOTE — Patient Instructions (Addendum)
 Medication Instructions:  START TAKING CRESTOR 10 MG DAILY.  Lab Work: NONE TO BE DONE TODAY.  Testing/Procedures: Your physician has requested that you have an echocardiogram. Echocardiography is a painless test that uses sound waves to create images of your heart. It provides your doctor with information about the size and shape of your heart and how well your heart's chambers and valves are working. This procedure takes approximately one hour. There are no restrictions for this procedure. Please do NOT wear cologne, perfume, aftershave, or lotions (deodorant is allowed). Please arrive 15 minutes prior to your appointment time.  Please note: We ask at that you not bring children with you during ultrasound (echo/ vascular) testing. Due to room size and safety concerns, children are not allowed in the ultrasound rooms during exams. Our front office staff cannot provide observation of children in our lobby area while testing is being conducted. An adult accompanying a patient to their appointment will only be allowed in the ultrasound room at the discretion of the ultrasound technician under special circumstances. We apologize for any inconvenience.   ZIO XT- Long Term Monitor Instructions  Your physician has requested you wear a ZIO patch monitor for 3 days.  This is a single patch monitor. [employer] supplies one patch monitor per enrollment. Additional stickers are not available. Please do not apply patch if you will be having a Nuclear Stress Test,  Echocardiogram, Cardiac CT, MRI, or Chest Xray during the period you would be wearing the  monitor. The patch cannot be worn during these tests. You cannot remove and re-apply the  ZIO XT patch monitor.  Your ZIO patch monitor will be mailed 3 day USPS to your address on file. It may take 3-5 days  to receive your monitor after you have been enrolled.  Once you have received your monitor, please review the enclosed instructions. Your monitor  has  already been registered assigning a specific monitor serial # to you.  Billing and Patient Assistance Program Information  We have supplied [employer] with any of your insurance information on file for billing purposes. [employer] offers a sliding scale Patient Assistance Program for patients that do not have  insurance, or whose insurance does not completely cover the cost of the ZIO monitor.  You must apply for the Patient Assistance Program to qualify for this discounted rate.  To apply, please call [employer] at [phone], select option 4, select option 2, ask to apply for  Patient Assistance Program. Meredeth will ask your household income, and how many people  are in your household. They will quote your out-of-pocket cost based on that information.  [employer] will also be able to set up a 9-month, interest-free payment plan if needed.  Applying the monitor   Shave hair from upper left chest.  Hold abrader disc by orange tab. Rub abrader in 40 strokes over the upper left chest as  indicated in your monitor instructions.  Clean area with 4 enclosed alcohol pads. Let dry.  Apply patch as indicated in monitor instructions. Patch will be placed under collarbone on left  side of chest with arrow pointing upward.  Rub patch adhesive wings for 2 minutes. Remove white label marked 1. Remove the white  label marked 2. Rub patch adhesive wings for 2 additional minutes.  While looking in a mirror, press and release button in center of patch. A small green light will  flash 3-4 times. This will be your only indicator that the monitor has been turned on.  Do not shower for the first 24 hours. You may shower after the first 24 hours.  Press the button if you feel a symptom. You will hear a small click. Record Date, Time and  Symptom in the Patient Logbook.  When you are ready to remove the patch, follow instructions on the last 2 pages of Patient  Logbook. Stick patch monitor onto the last page of  Patient Logbook.  Place Patient Logbook in the blue and white box. Use locking tab on box and tape box closed  securely. The blue and white box has prepaid postage on it. Please place it in the mailbox as  soon as possible. Your physician should have your test results approximately 7 days after the  monitor has been mailed back to [facility].  Call [facility] Customer Care at [phone] if you have questions regarding  your ZIO XT patch monitor. Call them immediately if you see an orange light blinking on your  monitor.  If your monitor falls off in less than 4 days, contact our Monitor department at [phone].  If your monitor becomes loose or falls off after 4 days call [employer] at [phone] for  suggestions on securing your monitor   Your physician has requested that you have a lower or upper extremity venous duplex. This test is an ultrasound of the veins in the legs or arms. It looks at venous blood flow that carries blood from the heart to the legs or arms. Allow one hour for a Lower Venous exam. Allow thirty minutes for an Upper Venous exam. There are no restrictions or special instructions.  Please note: We ask at that you not bring children with you during ultrasound (echo/ vascular) testing. Due to room size and safety concerns, children are not allowed in the ultrasound rooms during exams. Our front office staff cannot provide observation of children in our lobby area while testing is being conducted. An adult accompanying a patient to their appointment will only be allowed in the ultrasound room at the discretion of the ultrasound technician under special circumstances. We apologize for any inconvenience.   Follow-Up: At [facility], you and your health needs are our priority.  As part of our continuing mission to provide you with exceptional heart care, our providers are all part of one team.  This team includes your primary Cardiologist (physician) and  Advanced Practice Providers or APPs (Physician Assistants and Nurse Practitioners) who all work together to provide you with the care you need, when you need it.  Your next appointment:   2 MONTHS  Provider:   DR. REN, MD

## 2024-01-09 ENCOUNTER — Telehealth: Payer: Self-pay | Admitting: Cardiology

## 2024-01-09 NOTE — Telephone Encounter (Signed)
 Patient identification verified by 2 forms.   Called and spoke to patient  Patient states:  -I don't think I can make it till than.  -I am so weak.  -I can barley walk across the room.  -This is really getting to me.  -I am use to going.   Patient denies:  -Additional symptoms.              Interventions/Plan: -Appointments added to wait list.  -Pt thankful for the call back.   Patient agrees with plan, no questions at this time

## 2024-01-09 NOTE — Telephone Encounter (Signed)
 Patient has an ECHO and DVT scheduled in November. Patient is concerned that she will not be able to wait that long. Patient would like to speak with a nurse. Please advise.

## 2024-01-12 ENCOUNTER — Emergency Department (HOSPITAL_BASED_OUTPATIENT_CLINIC_OR_DEPARTMENT_OTHER)

## 2024-01-12 ENCOUNTER — Other Ambulatory Visit: Payer: Self-pay

## 2024-01-12 ENCOUNTER — Emergency Department (HOSPITAL_BASED_OUTPATIENT_CLINIC_OR_DEPARTMENT_OTHER)
Admission: EM | Admit: 2024-01-12 | Discharge: 2024-01-12 | Disposition: A | Discharge status: Home / Self Care | Attending: Emergency Medicine | Admitting: Emergency Medicine

## 2024-01-12 DIAGNOSIS — R0609 Other forms of dyspnea: Secondary | ICD-10-CM | POA: Insufficient documentation

## 2024-01-12 DIAGNOSIS — I700 Atherosclerosis of aorta: Secondary | ICD-10-CM | POA: Diagnosis not present

## 2024-01-12 DIAGNOSIS — Z853 Personal history of malignant neoplasm of breast: Secondary | ICD-10-CM | POA: Diagnosis not present

## 2024-01-12 DIAGNOSIS — J9811 Atelectasis: Secondary | ICD-10-CM | POA: Diagnosis not present

## 2024-01-12 DIAGNOSIS — R002 Palpitations: Secondary | ICD-10-CM | POA: Diagnosis not present

## 2024-01-12 DIAGNOSIS — I10 Essential (primary) hypertension: Secondary | ICD-10-CM | POA: Insufficient documentation

## 2024-01-12 DIAGNOSIS — R0602 Shortness of breath: Secondary | ICD-10-CM | POA: Diagnosis not present

## 2024-01-12 DIAGNOSIS — Z79899 Other long term (current) drug therapy: Secondary | ICD-10-CM | POA: Insufficient documentation

## 2024-01-12 LAB — T4, FREE: Free T4: 0.85 ng/dL (ref 0.61–1.12)

## 2024-01-12 LAB — CBC
HCT: 41.5 % (ref 36.0–46.0)
Hemoglobin: 13.7 g/dL (ref 12.0–15.0)
MCH: 29.6 pg (ref 26.0–34.0)
MCHC: 33 g/dL (ref 30.0–36.0)
MCV: 89.6 fL (ref 80.0–100.0)
Platelets: 270 K/uL (ref 150–400)
RBC: 4.63 MIL/uL (ref 3.87–5.11)
RDW: 14.1 % (ref 11.5–15.5)
WBC: 6.9 K/uL (ref 4.0–10.5)
nRBC: 0 % (ref 0.0–0.2)

## 2024-01-12 LAB — BASIC METABOLIC PANEL WITH GFR
Anion gap: 12 (ref 5–15)
BUN: 16 mg/dL (ref 8–23)
CO2: 24 mmol/L (ref 22–32)
Calcium: 9.6 mg/dL (ref 8.9–10.3)
Chloride: 104 mmol/L (ref 98–111)
Creatinine, Ser: 1.11 mg/dL — ABNORMAL HIGH (ref 0.44–1.00)
GFR, Estimated: 49 mL/min — ABNORMAL LOW (ref >60)
Glucose, Bld: 100 mg/dL — ABNORMAL HIGH (ref 70–99)
Potassium: 4 mmol/L (ref 3.5–5.1)
Sodium: 139 mmol/L (ref 135–145)

## 2024-01-12 LAB — TSH: TSH: 4.97 u[IU]/mL — ABNORMAL HIGH (ref 0.350–4.500)

## 2024-01-12 LAB — TROPONIN T, HIGH SENSITIVITY: Troponin T High Sensitivity: <15 ng/L (ref 0–19)

## 2024-01-12 LAB — PRO BRAIN NATRIURETIC PEPTIDE: Pro Brain Natriuretic Peptide: 80.2 pg/mL (ref <300.0)

## 2024-01-12 LAB — MAGNESIUM: Magnesium: 2.3 mg/dL (ref 1.7–2.4)

## 2024-01-12 MED ORDER — IPRATROPIUM-ALBUTEROL 0.5-2.5 (3) MG/3ML IN SOLN
3.0000 mL | Freq: Once | RESPIRATORY_TRACT | Status: AC
  Administered 2024-01-12: 3 mL via RESPIRATORY_TRACT
  Filled 2024-01-12: qty 3

## 2024-01-12 MED ORDER — IOHEXOL 350 MG/ML SOLN
100.0000 mL | Freq: Once | INTRAVENOUS | Status: AC | PRN
  Administered 2024-01-12: 75 mL via INTRAVENOUS

## 2024-01-12 NOTE — ED Provider Notes (Signed)
 [place] EMERGENCY DEPARTMENT AT [facility] Provider Note  CSN: [id number] Arrival date & time: 01/12/24 1211  Chief Complaint(s) Palpitations  HPI Sarah Young is a 83 y.o. female who is here today for shortness of breath.  This been ongoing since patient had COVID in early September.  She has followed up with cardiology, worn a monitor which showed relatively high burden of PVCs.  She has not had a formal echocardiogram.  She states that she gets significantly short of breath with minor exertion.  She is here today with her husband at bedside helps provide history.   Past Medical History Past Medical History:  Diagnosis Date   Anxiety    Arthritis    OA of hands   Breast cancer of upper-outer quadrant of right female breast (HCC) 05/08/2014   DVT (deep venous thrombosis) (HCC) 1980s   Fibromyalgia    Hyperlipidemia    Hypertension    Irritable bowel syndrome    Migraine    Osteopenia    Personal history of radiation therapy    Pulmonary embolus (HCC) 2021   S/P radiation therapy 06/2014   Right breast 4250 cGy in 17 sessions, right breast boost 1200 cGy in 6 sessions  = 07/11/2014 through 08/12/2014                         Vitamin D  deficiency    Wears glasses    Patient Active Problem List   Diagnosis Date Noted   Chest tightness 12/14/2023   History of COVID-19 12/14/2023   Hemorrhoids, external 11/09/2022   Medication intolerance 07/26/2022   Recurrent epistaxis 05/27/2022   Herpes zoster 02/02/2021   Medicare annual wellness visit, subsequent 08/14/2020   Aortic atherosclerosis 08/23/2019   Hypercoagulopathy 05/22/2019   History of pulmonary embolism 05/07/2019   Osteoporosis 05/02/2018   Routine general medical examination at a health care facility 11/21/2016   Estrogen deficiency 11/19/2016   Pedal edema 12/15/2015   Family history of breast cancer in female 05/17/2014   History of breast cancer 05/08/2014   GERD (gastroesophageal reflux  disease) 03/01/2014   Lumbar degenerative disc disease 08/15/2013   Chronic pain of scapula 08/15/2013   Essential hypertension 10/02/2008   Migraine 09/06/2008   Vitamin D  deficiency 06/27/2007   HYPERLIPIDEMIA 05/11/2007   Generalized anxiety disorder 05/11/2007   Disorder of bone and cartilage 05/11/2007   H/O poliomyelitis 05/08/2007   OSTEOARTHRITIS, HANDS, BILATERAL 05/08/2007   Fibromyalgia 05/08/2007   URINARY INCONTINENCE, STRESS, MILD 05/08/2007   Home Medication(s) Prior to Admission medications   Medication Sig Start Date End Date Taking? Authorizing Provider  acetaminophen  (TYLENOL ) 500 MG tablet Take 1,000 mg by mouth every 6 (six) hours as needed for mild pain (or headaches).    [provider]  albuterol  (VENTOLIN  HFA) 108 (90 Base) MCG/ACT inhaler Inhale 2 puffs into the lungs every 6 (six) hours as needed for wheezing or shortness of breath. 12/09/23   Ernest Ronal BRAVO, MD  amitriptyline  (ELAVIL ) 10 MG tablet TAKE 2 TABLETS BY MOUTH AT BEDTIME. 07/06/23   Tower, Laine LABOR, MD  amLODipine  (NORVASC ) 5 MG tablet TAKE 1 TABLET (5 MG TOTAL) BY MOUTH DAILY. 09/13/23   Tower, Laine LABOR, MD  Ascorbic Acid (VITAMIN C PO) Take 1 tablet by mouth every morning.    [provider]  benzonatate  (TESSALON ) 100 MG capsule Take 1 capsule (100 mg total) by mouth 3 (three) times daily as needed. 12/05/23   Cable,  Lynwood HERO, NP  Calcium Carb-Cholecalciferol (CALCIUM + D3 PO) Take 1 tablet by mouth in the morning.    [provider]  Cholecalciferol (VITAMIN D -3) 25 MCG (1000 UT) CAPS Take 1,000 Units by mouth daily.    [provider]  Cyanocobalamin (VITAMIN B-12 PO) Take 1 tablet by mouth every morning.    [provider]  hydrocortisone  (ANUSOL -HC) 2.5 % rectal cream Place 1 Application rectally 2 (two) times daily. To affected area/hemorrhoids for 10 days maximum 11/09/22   Tower, Laine LABOR, MD  Multiple Vitamins-Minerals (ZINC PO) Take 1 tablet by mouth 2  (two) times a week.    [provider]  Polyethyl Glycol-Propyl Glycol (SYSTANE) 0.4-0.3 % SOLN Place 1 drop into both eyes 2 (two) times daily.    [provider]  rosuvastatin (CRESTOR) 10 MG tablet Take 1 tablet (10 mg total) by mouth daily. 01/02/24 04/01/24  Azobou Donley Joelle DEL, MD  SUMAtriptan  (IMITREX ) 100 MG tablet TAKE 1 TABLET BY MOUTH AS NEEDED FOR MIGRAINE, MAY REPEAT IN 2 HOURS IF NEEDED. MAX 2 TABS IN 24 HOURS. 07/06/23   Tower, Laine LABOR, MD  traMADol  (ULTRAM ) 50 MG tablet Take 50 mg by mouth every 6 (six) hours as needed.    [provider]  XARELTO  20 MG TABS tablet TAKE 1 TABLET BY MOUTH DAILY WITH SUPPER. 04/04/23   Tower, Laine LABOR, MD                                                                                                                                    Past Surgical History Past Surgical History:  Procedure Laterality Date   ABDOMINAL HYSTERECTOMY  1972   APPENDECTOMY     BREAST EXCISIONAL BIOPSY Left    BREAST LUMPECTOMY Right 2016   BREAST SURGERY  1995   lumpectomy fibrocystic breast-lt   CATARACT EXTRACTION     both   CHOLECYSTECTOMY  2005   COLONOSCOPY     ovarian cyst removed  1985   RADIOACTIVE SEED GUIDED PARTIAL MASTECTOMY WITH AXILLARY SENTINEL LYMPH NODE BIOPSY Right 05/30/2014   Procedure: RADIOACTIVE SEED GUIDED PARTIAL MASTECTOMY WITH AXILLARY SENTINEL LYMPH NODE BIOPSY;  Surgeon: Deward Null III, MD;  Location: [facility];  Service: General;  Laterality: Right;   Family History Family History  Problem Relation Age of Onset   Hypertension Father    Cancer Father 70       esophageal - smoker   Leukemia Mother    Cancer Mother 18       leukemia   Cancer Brother 40       breast CA   Breast cancer Brother 47   Cancer Maternal Aunt 9       leukemia   Cancer Paternal Aunt 26       breast   Breast cancer Paternal Aunt 43   Breast cancer Maternal Grandmother 26   Breast cancer  Sister 30    Social  History Social History   Tobacco Use   Smoking status: Never   Smokeless tobacco: Never   Tobacco comments:    non smoker  Vaping Use   Vaping status: Never Used  Substance Use Topics   Alcohol use: No    Alcohol/week: 0.0 standard drinks of alcohol   Drug use: No   Allergies Amoxicillin, Amoxicillin-pot clavulanate, Esomeprazole  magnesium , Gabapentin , Omeprazole, Other, Penicillins, Ranitidine hcl, Sulfa  antibiotics, and Wasp venom  Review of Systems Review of Systems  Physical Exam Vital Signs  I have reviewed the triage vital signs BP 109/80   Pulse 73   Temp 97.9 F (36.6 C)   Resp (!) 21   Ht 5' 3 (1.6 m)   Wt 65.3 kg   SpO2 99%   BMI 25.51 kg/m   Physical Exam Vitals and nursing note reviewed.  Constitutional:      Appearance: Normal appearance.  HENT:     Head: Normocephalic and atraumatic.  Cardiovascular:     Rate and Rhythm: Normal rate.  Pulmonary:     Effort: Pulmonary effort is normal.     Breath sounds: No wheezing or rales.  Abdominal:     General: Abdomen is flat. There is no distension.     Palpations: Abdomen is soft.     Tenderness: There is no abdominal tenderness.  Musculoskeletal:     Cervical back: Normal range of motion.  Skin:    General: Skin is warm and dry.  Neurological:     Mental Status: She is alert.     ED Results and Treatments Labs (all labs ordered are listed, but only abnormal results are displayed) Labs Reviewed  BASIC METABOLIC PANEL WITH GFR - Abnormal; Notable for the following components:      Result Value   Glucose, Bld 100 (*)    Creatinine, Ser 1.11 (*)    GFR, Estimated 49 (*)    All other components within normal limits  TSH - Abnormal; Notable for the following components:   TSH 4.970 (*)    All other components within normal limits  CBC  PRO BRAIN NATRIURETIC PEPTIDE  MAGNESIUM   T4, FREE  TROPONIN T, HIGH SENSITIVITY  TROPONIN T, HIGH SENSITIVITY                                                                                                                           Radiology CT Angio Chest PE W/Cm &/Or Wo Cm Result Date: 01/12/2024 EXAM: CTA CHEST 01/12/2024 02:16:43 PM TECHNIQUE: CTA of the chest was performed without and with the administration of 75 mL of iohexol  (OMNIPAQUE ) 350 MG/ML injection. Multiplanar reformatted images are provided for review. MIP images are provided for review. Automated exposure control, iterative reconstruction, and/or weight based adjustment of the mA/kV was utilized to reduce the radiation dose to as low as reasonably achievable. COMPARISON: 12/09/2023 CLINICAL HISTORY: Pulmonary embolism (PE) suspected, high prob. Triage note: Pt caox4 ambulatory  c/o palpitations, chet discomfort and SOB intermittent for the past 2 weeks, worsens on exertion. FINDINGS: PULMONARY ARTERIES: Pulmonary arteries are adequately opacified for evaluation. No acute pulmonary embolus. Main pulmonary artery is normal in caliber. MEDIASTINUM: The heart and pericardium demonstrate no acute abnormality. Aortic atherosclerosis. There is no acute abnormality of the thoracic aorta. LYMPH NODES: No mediastinal, hilar or axillary lymphadenopathy. LUNGS AND PLEURA: Minimal bibasilar subsegmental atelectasis. No focal consolidation or pulmonary edema. No evidence of pleural effusion or pneumothorax. UPPER ABDOMEN: Small hiatal hernia. Limited images of the upper abdomen are otherwise unremarkable. SOFT TISSUES AND BONES: No acute bone or soft tissue abnormality. IMPRESSION: 1. No pulmonary embolism. 2. Aortic atherosclerosis. 3. Small hiatal hernia. 4. Minimal bibasilar subsegmental atelectasis. Electronically signed by: Lynwood Seip MD 01/12/2024 02:35 PM EDT RP Workstation: [id number]    Pertinent labs & imaging results that were available during my care of the patient were reviewed by me and considered in my medical decision making (see MDM for details).  Medications Ordered in ED Medications   iohexol  (OMNIPAQUE ) 350 MG/ML injection 100 mL (75 mLs Intravenous Contrast Given 01/12/24 1403)  ipratropium-albuterol  (DUONEB) 0.5-2.5 (3) MG/3ML nebulizer solution 3 mL (3 mLs Nebulization Given 01/12/24 1452)                                                                                                                                     Procedures Ultrasound ED Echo  Date/Time: 01/12/2024 1:10 PM  Performed by: Mannie Fairy DASEN, DO Authorized by: Mannie Fairy DASEN, DO   Procedure details:    Indications: dyspnea     Views: subxiphoid and IVC view     Images: archived     Limitations:  Body habitus Findings:    Pericardium: no pericardial effusion     LV Function: normal (>50% EF)     RV Diameter: normal     IVC: normal   Impression:    Impression: normal     (including critical care time)  Medical Decision Making / ED Course   This patient presents to the ED for concern of dyspnea on exertion, this involves an extensive number of treatment options, and is a complaint that carries with it a high risk of complications and morbidity.  The differential diagnosis includes pulmonary fibrosis, pneumonia, PE, ACS, less likely heart failure, post-COVID symptoms.  MDM: On exam, patient with normal vital signs.  Her lung sounds are clear.  She has no signs of fluid overload.  Bedside ultrasound shows normal cardiac wall motion activity, no pericardial effusion.  Her EKG, per my independent review, shows no ST segment depressions or elevations, no T wave inversions, does show frequent PVCs.  Given her duration of symptoms, will obtain CT angiography to assess for PE.  Have requested nursing staff ambulate the patient with cardiac monitor and pulse ox to check her functional status.  She has been evaluated by cardiology for the  symptoms already.  Reassessment 3:40 PM-patient's CTA negative.  Her troponin is negative.  Given her duration of symptoms, I do not believe a delta  troponin is necessary.  Patient ambulated in the emergency room, her O2 sat remained greater than 96%.  Her heart rate did increase a bit to 100, she was able to ambulate in our ED without significant difficulty.  I discussed these findings with the patient.  She feels comfortable with her workup, and will follow-up with her PCP.  She is normotensive, nontachycardic, nontachypneic with an O2 sat of 100% while I am in the room.  Her free T4 will not result today.  Have added her to my list and will contact if it is decreased.   Additional history obtained: -Additional history obtained from husband at bedside -External records from outside source obtained and reviewed including: Chart review including previous notes, labs, imaging, consultation notes   Lab Tests: -I ordered, reviewed, and interpreted labs.   The pertinent results include:   Labs Reviewed  BASIC METABOLIC PANEL WITH GFR - Abnormal; Notable for the following components:      Result Value   Glucose, Bld 100 (*)    Creatinine, Ser 1.11 (*)    GFR, Estimated 49 (*)    All other components within normal limits  TSH - Abnormal; Notable for the following components:   TSH 4.970 (*)    All other components within normal limits  CBC  PRO BRAIN NATRIURETIC PEPTIDE  MAGNESIUM   T4, FREE  TROPONIN T, HIGH SENSITIVITY  TROPONIN T, HIGH SENSITIVITY      EKG frequent PVCs.  No ST segment depressions or elevations, no acute ischemia.  EKG Interpretation Date/Time:  Thursday January 12 2024 12:19:28 EDT Ventricular Rate:  96 PR Interval:  172 QRS Duration:  74 QT Interval:  352 QTC Calculation: 444 R Axis:   39  Text Interpretation: Sinus rhythm with frequent Premature ventricular complexes Low voltage QRS Cannot rule out Anterior infarct (cited on or before 09-Dec-2023) Abnormal ECG When compared with ECG of 09-Dec-2023 10:40, No significant change was found Confirmed by Mannie Pac [phone]) on 01/12/2024 1:11:34 PM          Imaging Studies ordered: I ordered imaging studies including CTA chest I independently visualized and interpreted imaging. I agree with the radiologist interpretation   Medicines ordered and prescription drug management: Meds ordered this encounter  Medications   iohexol  (OMNIPAQUE ) 350 MG/ML injection 100 mL   ipratropium-albuterol  (DUONEB) 0.5-2.5 (3) MG/3ML nebulizer solution 3 mL    -I have reviewed the patients home medicines and have made adjustments as needed   Cardiac Monitoring: The patient was maintained on a cardiac monitor.  I personally viewed and interpreted the cardiac monitored which showed an underlying rhythm of: Normal sinus rhythm  Social Determinants of Health:  Factors impacting patients care include: Lack of access to primary care   Reevaluation: After the interventions noted above, I reevaluated the patient and found that they have :improved  Co morbidities that complicate the patient evaluation  Past Medical History:  Diagnosis Date   Anxiety    Arthritis    OA of hands   Breast cancer of upper-outer quadrant of right female breast (HCC) 05/08/2014   DVT (deep venous thrombosis) (HCC) 1980s   Fibromyalgia    Hyperlipidemia    Hypertension    Irritable bowel syndrome    Migraine    Osteopenia    Personal history of radiation therapy    Pulmonary  embolus (HCC) 2021   S/P radiation therapy 06/2014   Right breast 4250 cGy in 17 sessions, right breast boost 1200 cGy in 6 sessions  = 07/11/2014 through 08/12/2014                         Vitamin D  deficiency    Wears glasses       Dispostion: I considered admission for this patient, however with her reassuring workup, she is appropriate for continued outpatient follow-up.     Final Clinical Impression(s) / ED Diagnoses Final diagnoses:  Dyspnea on exertion     @PCDICTATION @    Mannie Pac T, DO 01/12/24 1543

## 2024-01-12 NOTE — ED Triage Notes (Signed)
 Pt caox4 ambulatory c/o palpitations, chet discomfort and SOB intermittent for the past 2 weeks, worsens on exertion.

## 2024-01-12 NOTE — ED Notes (Signed)
 Pt ambulated per MD... Pt was on the monitor... HR increased, SpO2 stayed the same and RR increased...  HR 118 SpO2 98% RR 28  Provider aware.[email or web address]

## 2024-01-12 NOTE — Discharge Instructions (Addendum)
 While you were in the emergency room, you had blood work done that overall was normal.  You do CT scan of your chest to look for pneumonia or blood clots in your lungs.  These test were normal.  Your oxygen was normal here in the emergency room.  Please follow-up with your primary care doctor.  I recommend giving them a call tomorrow for a follow-up appointment.  Return to the emergency room for new or worsening symptoms.

## 2024-01-12 NOTE — ED Notes (Signed)
 DC paperwork given and verbally understood.

## 2024-01-15 DIAGNOSIS — I493 Ventricular premature depolarization: Secondary | ICD-10-CM | POA: Diagnosis not present

## 2024-01-16 ENCOUNTER — Telehealth: Payer: Self-pay | Admitting: Family Medicine

## 2024-01-16 DIAGNOSIS — R0609 Other forms of dyspnea: Secondary | ICD-10-CM

## 2024-01-16 NOTE — Telephone Encounter (Signed)
 Spoke with pt and let her know Dr tower message

## 2024-01-16 NOTE — Telephone Encounter (Signed)
 I put the order in  They will reach out but if she wants to she can go ahead and call [facility] in [facility]   Thanks  Keep me posted

## 2024-01-16 NOTE — Telephone Encounter (Signed)
 Copied from CRM #[id number]. Topic: Referral - Question >> Jan 16, 2024  9:58 AM Precious C wrote: Reason for CRM: Patient called in requesting to speak with her provider regarding whether she needs a referral to a lung doctor or if she should find one on her own. Patient is requesting a call back for clarification on how to proceed.   Susann, [place] [phone]

## 2024-01-18 ENCOUNTER — Ambulatory Visit: Admitting: Student in an Organized Health Care Education/Training Program

## 2024-01-18 ENCOUNTER — Encounter: Payer: Self-pay | Admitting: Student in an Organized Health Care Education/Training Program

## 2024-01-18 VITALS — BP 130/66 | HR 99 | Temp 97.6°F | Ht 63.0 in | Wt 145.8 lb

## 2024-01-18 DIAGNOSIS — R059 Cough, unspecified: Secondary | ICD-10-CM | POA: Diagnosis not present

## 2024-01-18 DIAGNOSIS — U099 Post covid-19 condition, unspecified: Secondary | ICD-10-CM

## 2024-01-18 DIAGNOSIS — R0602 Shortness of breath: Secondary | ICD-10-CM

## 2024-01-18 NOTE — Patient Instructions (Signed)
  VISIT SUMMARY: Today, you came in because you have been experiencing shortness of breath with exertion and coughing since your COVID-19 infection in August 2025. You described feeling weak and breathless with minimal activity, and you also have chest tightness and produce greenish-yellow phlegm. We discussed your medical history, including your previous episode of shortness of breath after a COVID-19 infection in 2020, your history of breast cancer, and your history of DVT.  YOUR PLAN: -EXERTIONAL DYSPNEA AND COUGH FOLLOWING COVID-19 INFECTION: Your shortness of breath and cough are likely related to your recent COVID-19 infection. This condition can cause lingering respiratory symptoms. We will conduct a pulmonary function test to evaluate your lung function and follow up on your scheduled echocardiogram to check your heart. In the meantime, start a graded exercise program, beginning with five minutes of activity and gradually increasing as you feel able.  INSTRUCTIONS: Please follow up on the results of your echocardiogram and complete the pulmonary function test as ordered. Begin the graded exercise program as discussed, starting with five minutes and gradually increasing. If you experience any worsening of symptoms, please contact our office.

## 2024-01-18 NOTE — Progress Notes (Signed)
 Synopsis: Referred in for shortness of breath by Sarah Young LABOR, MD  Assessment & Plan:   #Shortness of breath #Exertional dyspnea and cough following COVID-19 infection    She presents with exertional dyspnea and cough after a COVID-19 infection in August 2025, experiencing shortness of breath on exertion, coughing, and greenish-yellow sputum. She reports chest tightness without wheezing. Similar symptoms occurred post-COVID in December 2020 and resolved.   Multiple CT scans for pulmonary embolism were negative for PE, and she is currently on Xarelto . The differential diagnosis includes post-COVID sequelae, but also on the differential is an anginal equivalent as well as pulmonary hypertension. A cardiac evaluation is ongoing with an echocardiogram scheduled. Will order a pulmonary function test to evaluate lung function and assess for obstructive lung disease. Follow up on echocardiogram results. Encourage a graded exercise program starting at five minutes and gradually increasing.   - Pulmonary Function Test; Future - TTE already ordered by cardiology, will follow up on result - Follow up with Cardiology   Return in about 3 months (around 04/19/2024).  I spent 60 minutes caring for this patient today, including preparing to see the patient, obtaining a medical history , reviewing a separately obtained history, performing a medically appropriate examination and/or evaluation, counseling and educating the patient/family/caregiver, ordering medications, tests, or procedures, documenting clinical information in the electronic health record, and independently interpreting results (not separately reported/billed) and communicating results to the patient/family/caregiver  Belva November, MD [facility]   End of visit medications:  No orders of the defined types were placed in this encounter.    Current Outpatient Medications:    acetaminophen  (TYLENOL ) 500 MG tablet,  Take 1,000 mg by mouth every 6 (six) hours as needed for mild pain (or headaches)., Disp: , Rfl:    albuterol  (VENTOLIN  HFA) 108 (90 Base) MCG/ACT inhaler, Inhale 2 puffs into the lungs every 6 (six) hours as needed for wheezing or shortness of breath., Disp: 8 g, Rfl: 2   amitriptyline  (ELAVIL ) 10 MG tablet, TAKE 2 TABLETS BY MOUTH AT BEDTIME., Disp: 180 tablet, Rfl: 3   amLODipine  (NORVASC ) 5 MG tablet, TAKE 1 TABLET (5 MG TOTAL) BY MOUTH DAILY., Disp: 90 tablet, Rfl: 2   Ascorbic Acid (VITAMIN C PO), Take 1 tablet by mouth every morning., Disp: , Rfl:    benzonatate  (TESSALON ) 100 MG capsule, Take 1 capsule (100 mg total) by mouth 3 (three) times daily as needed., Disp: 21 capsule, Rfl: 0   Calcium Carb-Cholecalciferol (CALCIUM + D3 PO), Take 1 tablet by mouth in the morning., Disp: , Rfl:    Cholecalciferol (VITAMIN D -3) 25 MCG (1000 UT) CAPS, Take 1,000 Units by mouth daily., Disp: , Rfl:    Cyanocobalamin (VITAMIN B-12 PO), Take 1 tablet by mouth every morning., Disp: , Rfl:    hydrocortisone  (ANUSOL -HC) 2.5 % rectal cream, Place 1 Application rectally 2 (two) times daily. To affected area/hemorrhoids for 10 days maximum, Disp: 30 g, Rfl: 0   Multiple Vitamins-Minerals (ZINC PO), Take 1 tablet by mouth 2 (two) times a week., Disp: , Rfl:    Polyethyl Glycol-Propyl Glycol (SYSTANE) 0.4-0.3 % SOLN, Place 1 drop into both eyes 2 (two) times daily., Disp: , Rfl:    SUMAtriptan  (IMITREX ) 100 MG tablet, TAKE 1 TABLET BY MOUTH AS NEEDED FOR MIGRAINE, MAY REPEAT IN 2 HOURS IF NEEDED. MAX 2 TABS IN 24 HOURS., Disp: 9 tablet, Rfl: 11   traMADol  (ULTRAM ) 50 MG tablet, Take 50 mg by mouth every  6 (six) hours as needed., Disp: , Rfl:    XARELTO  20 MG TABS tablet, TAKE 1 TABLET BY MOUTH DAILY WITH SUPPER., Disp: 90 tablet, Rfl: 3   rosuvastatin (CRESTOR) 10 MG tablet, Take 1 tablet (10 mg total) by mouth daily. (Patient not taking: Reported on 01/18/2024), Disp: 90 tablet, Rfl: 3   Subjective:   PATIENT  ID: Sarah Young GENDER: female DOB: 1940-05-23, MRN: [id number]  Chief Complaint  Patient presents with   Consult    Shortness of breath on exertion x 2 months. After having COVID end of August.     HPI  Discussed the use of AI scribe software for clinical note transcription with the patient, who gave verbal consent to proceed.  Sarah Young is an 83 year old female with a history of breast cancer and DVT who presents with shortness of breath with exertion.  She has been experiencing shortness of breath with exertion for the past two months, which began after contracting COVID-19 in August 2025. She feels weak and becomes breathless with minimal activity, such as taking a shower. She also experiences spells of coughing and produces greenish-yellow phlegm. No wheezing, but she reports chest tightness. She has been referred to cardiology and evaluated in clinic on 01/02/2024. Troponin was negative during her ED visits.  Her medical history includes a previous episode of shortness of breath following a COVID-19 infection in December 2020, which resolved over time. 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 She has had multiple negative CT scans for pulmonary embolism, most recently in September and October 2025. She was prescribed albuterol , which she uses once or twice a day, and prednisone , which she reports helped alleviate some symptoms.  She has no history of asthma or other lung problems and denies any family history of asthma. 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 She continues to work two days a week and feels blessed to have been healthy over the years.      Ancillary information including prior medications, full medical/surgical/family/social histories, and PFTs (when  available) are listed below and have been reviewed.   Review of Systems  Constitutional:  Negative for chills, fever and weight loss.  Respiratory:  Positive for cough, sputum production and shortness of breath. Negative for hemoptysis and wheezing.   Cardiovascular:  Negative for chest pain.     Objective:   Vitals:   01/18/24 0854  BP: 130/66  Pulse: 99  Temp: 97.6 F (36.4 C)  TempSrc: Temporal  SpO2: 100%  Weight: 145 lb 12.8 oz (66.1 kg)  Height: 5' 3 (1.6 m)   100% on RA BMI Readings from Last 3 Encounters:  01/18/24 25.83 kg/m  01/12/24 25.51 kg/m  01/02/24 25.11 kg/m   Wt Readings from Last 3 Encounters:  01/18/24 145 lb 12.8 oz (66.1 kg)  01/12/24 144 lb (65.3 kg)  01/02/24 144 lb (65.3 kg)    Physical Exam    Ancillary Information    Past Medical History:  Diagnosis Date   Anxiety    Arthritis    OA of hands   Breast cancer of upper-outer quadrant of right female breast (HCC) 05/08/2014   DVT (deep venous thrombosis) (HCC) 1980s   Fibromyalgia    Hyperlipidemia    Hypertension  Irritable bowel syndrome    Migraine    Osteopenia    Personal history of radiation therapy    Pulmonary embolus (HCC) 2021   S/P radiation therapy 06/2014   Right breast 4250 cGy in 17 sessions, right breast boost 1200 cGy in 6 sessions  = 07/11/2014 through 08/12/2014                         Vitamin D  deficiency    Wears glasses      Family History  Problem Relation Age of Onset   Hypertension Father    Cancer Father 61       esophageal - smoker   Leukemia Mother    Cancer Mother 37       leukemia   Cancer Brother 8       breast CA   Breast cancer Brother 41   Cancer Maternal Aunt 15       leukemia   Cancer Paternal Aunt 43       breast   Breast cancer Paternal Aunt 77   Breast cancer Maternal Grandmother 67   Breast cancer Sister 23     Past Surgical History:  Procedure Laterality Date   ABDOMINAL HYSTERECTOMY  1972   APPENDECTOMY      BREAST EXCISIONAL BIOPSY Left    BREAST LUMPECTOMY Right 2016   BREAST SURGERY  1995   lumpectomy fibrocystic breast-lt   CATARACT EXTRACTION     both   CHOLECYSTECTOMY  2005   COLONOSCOPY     ovarian cyst removed  1985   RADIOACTIVE SEED GUIDED PARTIAL MASTECTOMY WITH AXILLARY SENTINEL LYMPH NODE BIOPSY Right 05/30/2014   Procedure: RADIOACTIVE SEED GUIDED PARTIAL MASTECTOMY WITH AXILLARY SENTINEL LYMPH NODE BIOPSY;  Surgeon: Deward Null III, MD;  Location: [facility];  Service: General;  Laterality: Right;    Social History   Socioeconomic History   Marital status: Married    Spouse name: Not on file   Number of children: Not on file   Years of education: Not on file   Highest education level: Not on file  Occupational History   Occupation: OFFICE MANAGER    Employer: [employer]  Tobacco Use   Smoking status: Never   Smokeless tobacco: Never   Tobacco comments:    non smoker  Vaping Use   Vaping status: Never Used  Substance and Sexual Activity   Alcohol use: No    Alcohol/week: 0.0 standard drinks of alcohol   Drug use: No   Sexual activity: Not Currently  Other Topics Concern   Not on file  Social History Narrative   Usually very active.      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   Social Drivers of Corporate investment banker Strain: Low Risk  (01/24/2023)   Overall Financial Resource Strain (CARDIA)    Difficulty of Paying Living Expenses: Not hard at all  Food Insecurity: No Food Insecurity (01/24/2023)   Hunger Vital Sign    Worried About Running Out of Food in the Last Year: Never true    Ran Out of Food in the Last Year: Never true  Transportation Needs: No Transportation Needs (01/24/2023)   PRAPARE - Administrator, Civil Service (Medical): No    Lack of Transportation (Non-Medical): No  Physical Activity: Insufficiently Active (01/24/2023)   Exercise Vital Sign    Days of Exercise per Week: 3 days  Minutes of Exercise per Session: 30 min  Stress: No Stress Concern Present (01/24/2023)   [employer] of Occupational Health - Occupational Stress Questionnaire    Feeling of Stress : Not at all  Social Connections: Socially Integrated (01/24/2023)   Social Connection and Isolation Panel    Frequency of Communication with Friends and Family: More than three times a week    Frequency of Social Gatherings with Friends and Family: More than three times a week    Attends Religious Services: More than 4 times per year    Active Member of Clubs or Organizations: Yes    Attends Banker Meetings: More than 4 times per year    Marital Status: Married  Catering manager Violence: Not At Risk (01/24/2023)   Humiliation, Afraid, Rape, and Kick questionnaire    Fear of Current or Ex-Partner: No    Emotionally Abused: No    Physically Abused: No    Sexually Abused: No     Allergies  Allergen Reactions   Amoxicillin Itching and Other (See Comments)     Paradoxical Reaction- made infection worse    Amoxicillin-Pot Clavulanate Other (See Comments)    Felt like body turned inside out   Esomeprazole  Magnesium  Nausea And Vomiting   Gabapentin  Other (See Comments)    Caused nightmares   Omeprazole Nausea And Vomiting   Other Itching, Swelling and Other (See Comments)    Ant venom = Made the site (where bitten) swell   Penicillins Other (See Comments)   Ranitidine Hcl Other (See Comments)    Not effective   Sulfa  Antibiotics    Wasp Venom Swelling and Other (See Comments)    Made leg swell where stung     CBC    Component Value Date/Time   WBC 6.9 01/12/2024 1220   RBC 4.63 01/12/2024 1220   HGB 13.7 01/12/2024 1220   HGB 13.7 03/11/2020 1028   HGB 13.2 02/03/2017 0932   HCT 41.5 01/12/2024 1220   HCT 40.1 02/03/2017 0932   PLT 270 01/12/2024 1220   PLT 223 03/11/2020 1028   PLT 198 02/03/2017 0932   MCV 89.6 01/12/2024 1220   MCV 92.0 02/03/2017 0932   MCH  29.6 01/12/2024 1220   MCHC 33.0 01/12/2024 1220   RDW 14.1 01/12/2024 1220   RDW 14.0 02/03/2017 0932   LYMPHSABS 1.6 06/20/2023 0904   LYMPHSABS 1.3 02/03/2017 0932   MONOABS 0.5 06/20/2023 0904   MONOABS 0.4 02/03/2017 0932   EOSABS 0.1 06/20/2023 0904   EOSABS 0.1 02/03/2017 0932   BASOSABS 0.1 06/20/2023 0904   BASOSABS 0.1 02/03/2017 0932    Pulmonary Functions Testing Results:     No data to display          Outpatient Medications Prior to Visit  Medication Sig Dispense Refill   acetaminophen  (TYLENOL ) 500 MG tablet Take 1,000 mg by mouth every 6 (six) hours as needed for mild pain (or headaches).     albuterol  (VENTOLIN  HFA) 108 (90 Base) MCG/ACT inhaler Inhale 2 puffs into the lungs every 6 (six) hours as needed for wheezing or shortness of breath. 8 g 2   amitriptyline  (ELAVIL ) 10 MG tablet TAKE 2 TABLETS BY MOUTH AT BEDTIME. 180 tablet 3   amLODipine  (NORVASC ) 5 MG tablet TAKE 1 TABLET (5 MG TOTAL) BY MOUTH DAILY. 90 tablet 2   Ascorbic Acid (VITAMIN C PO) Take 1 tablet by mouth every morning.     benzonatate  (TESSALON ) 100 MG capsule Take 1  capsule (100 mg total) by mouth 3 (three) times daily as needed. 21 capsule 0   Calcium Carb-Cholecalciferol (CALCIUM + D3 PO) Take 1 tablet by mouth in the morning.     Cholecalciferol (VITAMIN D -3) 25 MCG (1000 UT) CAPS Take 1,000 Units by mouth daily.     Cyanocobalamin (VITAMIN B-12 PO) Take 1 tablet by mouth every morning.     hydrocortisone  (ANUSOL -HC) 2.5 % rectal cream Place 1 Application rectally 2 (two) times daily. To affected area/hemorrhoids for 10 days maximum 30 g 0   Multiple Vitamins-Minerals (ZINC PO) Take 1 tablet by mouth 2 (two) times a week.     Polyethyl Glycol-Propyl Glycol (SYSTANE) 0.4-0.3 % SOLN Place 1 drop into both eyes 2 (two) times daily.     SUMAtriptan  (IMITREX ) 100 MG tablet TAKE 1 TABLET BY MOUTH AS NEEDED FOR MIGRAINE, MAY REPEAT IN 2 HOURS IF NEEDED. MAX 2 TABS IN 24 HOURS. 9 tablet 11    traMADol  (ULTRAM ) 50 MG tablet Take 50 mg by mouth every 6 (six) hours as needed.     XARELTO  20 MG TABS tablet TAKE 1 TABLET BY MOUTH DAILY WITH SUPPER. 90 tablet 3   rosuvastatin (CRESTOR) 10 MG tablet Take 1 tablet (10 mg total) by mouth daily. (Patient not taking: Reported on 01/18/2024) 90 tablet 3   No facility-administered medications prior to visit.

## 2024-01-24 ENCOUNTER — Ambulatory Visit (HOSPITAL_BASED_OUTPATIENT_CLINIC_OR_DEPARTMENT_OTHER): Admission: RE | Admit: 2024-01-24 | Discharge: 2024-01-24 | Disposition: A | Source: Ambulatory Visit

## 2024-01-24 ENCOUNTER — Ambulatory Visit (HOSPITAL_BASED_OUTPATIENT_CLINIC_OR_DEPARTMENT_OTHER)
Admission: RE | Admit: 2024-01-24 | Discharge: 2024-01-24 | Disposition: A | Discharge status: Home / Self Care | Source: Ambulatory Visit

## 2024-01-24 DIAGNOSIS — Z86711 Personal history of pulmonary embolism: Secondary | ICD-10-CM | POA: Insufficient documentation

## 2024-01-24 DIAGNOSIS — R0602 Shortness of breath: Secondary | ICD-10-CM | POA: Diagnosis not present

## 2024-01-24 LAB — ECHOCARDIOGRAM COMPLETE
AR max vel: 2.15 cm2
AV Area VTI: 2.1 cm2
AV Area mean vel: 2.07 cm2
AV Mean grad: 3 mmHg
AV Peak grad: 5.8 mmHg
Ao pk vel: 1.21 m/s
Area-P 1/2: 3.3 cm2
Calc EF: 66.5 %
S' Lateral: 2.3 cm
Single Plane A2C EF: 68.6 %
Single Plane A4C EF: 65.4 %

## 2024-01-25 ENCOUNTER — Ambulatory Visit: Payer: Self-pay

## 2024-01-25 ENCOUNTER — Ambulatory Visit: Payer: Medicare Other

## 2024-01-25 VITALS — Ht 63.5 in | Wt 145.0 lb

## 2024-01-25 DIAGNOSIS — Z0000 Encounter for general adult medical examination without abnormal findings: Secondary | ICD-10-CM

## 2024-01-25 NOTE — Progress Notes (Signed)
 Subjective:   Sarah Young is a 83 y.o. who presents for a Medicare Wellness preventive visit.  As a reminder, Annual Wellness Visits don't include a physical exam, and some assessments may be limited, especially if this visit is performed virtually. We may recommend an in-person follow-up visit with your provider if needed.  Visit Complete: Virtual I connected with  Sarah Young on 01/25/24 by a audio enabled telemedicine application and verified that I am speaking with the correct person using two identifiers.  Patient Location: Home  Provider Location: Office/Clinic  I discussed the limitations of evaluation and management by telemedicine. The patient expressed understanding and agreed to proceed.  Vital Signs: Because this visit was a virtual/telehealth visit, some criteria may be missing or patient reported. Any vitals not documented were not able to be obtained and vitals that have been documented are patient reported.  VideoDeclined- This patient declined Sarah Young, Sarah Young. Therefore the visit was completed with audio only.  Persons Participating in Visit: Patient.  AWV Questionnaire: No: Patient Medicare AWV questionnaire was not completed prior to this visit.  Cardiac Risk Factors include: advanced age (>15men, >33 women);dyslipidemia;hypertension     Objective:    Today's Vitals   01/25/24 1345  Weight: 145 lb (65.8 kg)  Height: 5' 3.5 (1.613 m)  PainSc: 5    Body mass index is 25.28 kg/m.     01/25/2024    1:56 PM 12/09/2023   10:38 AM 01/24/2023   11:43 AM 08/18/2021    8:34 AM 01/28/2021    8:53 AM 06/09/2020   10:15 AM 06/07/2020   10:44 AM  Advanced Directives  Does Patient Have a Medical Advance Directive? Yes No Yes Yes No No Yes  Type of Estate Agent of [place];Living will  Healthcare Power of [place];Living will Healthcare Power of Attorney   Living will  Copy of Healthcare Power  of Attorney in Chart? No - copy requested  No - copy requested No - copy requested     Would patient like information on creating a medical advance directive?  No - Patient declined   No - Patient declined      Current Medications (verified) Outpatient Encounter Medications as of 01/25/2024  Medication Sig   acetaminophen  (TYLENOL ) 500 MG tablet Take 1,000 mg by mouth every 6 (six) hours as needed for mild pain (or headaches).   albuterol  (VENTOLIN  HFA) 108 (90 Base) MCG/ACT inhaler Inhale 2 puffs into the lungs every 6 (six) hours as needed for wheezing or shortness of breath.   amitriptyline  (ELAVIL ) 10 MG tablet TAKE 2 TABLETS BY MOUTH AT BEDTIME.   amLODipine  (NORVASC ) 5 MG tablet TAKE 1 TABLET (5 MG TOTAL) BY MOUTH DAILY.   Ascorbic Acid (VITAMIN C PO) Take 1 tablet by mouth every morning.   Calcium Carb-Cholecalciferol (CALCIUM + D3 PO) Take 1 tablet by mouth in the morning.   Cholecalciferol (VITAMIN D -3) 25 MCG (1000 UT) CAPS Take 1,000 Units by mouth daily.   Cyanocobalamin (VITAMIN B-12 PO) Take 1 tablet by mouth every morning.   hydrocortisone  (ANUSOL -HC) 2.5 % rectal cream Place 1 Application rectally 2 (two) times daily. To affected area/hemorrhoids for 10 days maximum   Multiple Vitamins-Minerals (ZINC PO) Take 1 tablet by mouth 2 (two) times a week.   Polyethyl Glycol-Propyl Glycol (SYSTANE) 0.4-0.3 % SOLN Place 1 drop into both eyes 2 (two) times daily.   rosuvastatin (CRESTOR) 10 MG tablet Take 1 tablet (10 mg total) by  mouth daily.   SUMAtriptan  (IMITREX ) 100 MG tablet TAKE 1 TABLET BY MOUTH AS NEEDED FOR MIGRAINE, MAY REPEAT IN 2 HOURS IF NEEDED. MAX 2 TABS IN 24 HOURS.   traMADol  (ULTRAM ) 50 MG tablet Take 50 mg by mouth every 6 (six) hours as needed.   XARELTO  20 MG TABS tablet TAKE 1 TABLET BY MOUTH DAILY WITH SUPPER.   benzonatate  (TESSALON ) 100 MG capsule Take 1 capsule (100 mg total) by mouth 3 (three) times daily as needed. (Patient not taking: Reported on 01/25/2024)    No facility-administered encounter medications on file as of 01/25/2024.    Allergies (verified) Amoxicillin, Amoxicillin-pot clavulanate, Esomeprazole  magnesium , Gabapentin , Omeprazole, Other, Penicillins, Ranitidine hcl, Sulfa  antibiotics, and Wasp venom   History: Past Medical History:  Diagnosis Date   Anxiety    Arthritis    OA of hands   Breast cancer of upper-outer quadrant of right female breast (HCC) 05/08/2014   DVT (deep venous thrombosis) (HCC) 1980s   Fibromyalgia    Hyperlipidemia    Hypertension    Irritable bowel syndrome    Migraine    Osteopenia    Personal history of radiation therapy    Pulmonary embolus (HCC) 2021   S/P radiation therapy 06/2014   Right breast 4250 cGy in 17 sessions, right breast boost 1200 cGy in 6 sessions  = 07/11/2014 through 08/12/2014                         Vitamin D  deficiency    Wears glasses    Past Surgical History:  Procedure Laterality Date   ABDOMINAL HYSTERECTOMY  1972   APPENDECTOMY     BREAST EXCISIONAL BIOPSY Left    BREAST LUMPECTOMY Right 2016   BREAST SURGERY  1995   lumpectomy fibrocystic breast-lt   CATARACT EXTRACTION     both   CHOLECYSTECTOMY  2005   COLONOSCOPY     ovarian cyst removed  1985   RADIOACTIVE SEED GUIDED PARTIAL MASTECTOMY WITH AXILLARY SENTINEL LYMPH NODE BIOPSY Right 05/30/2014   Procedure: RADIOACTIVE SEED GUIDED PARTIAL MASTECTOMY WITH AXILLARY SENTINEL LYMPH NODE BIOPSY;  Surgeon: Deward Null III, MD;  Location: [facility];  Service: General;  Laterality: Right;   Family History  Problem Relation Age of Onset   Hypertension Father    Cancer Father 5       esophageal - smoker   Leukemia Mother    Cancer Mother 93       leukemia   Cancer Brother 61       breast CA   Breast cancer Brother 70   Cancer Maternal Aunt 39       leukemia   Cancer Paternal Aunt 50       breast   Breast cancer Paternal Aunt 91   Breast cancer Maternal Grandmother 30   Breast cancer  Sister 68   Social History   Socioeconomic History   Marital status: Married    Spouse name: Not on file   Number of children: Not on file   Years of education: Not on file   Highest education level: Not on file  Occupational History   Occupation: OFFICE MANAGER    Employer: [employer]  Tobacco Use   Smoking status: Never   Smokeless tobacco: Never   Tobacco comments:    non smoker  Vaping Use   Vaping status: Never Used  Substance and Sexual Activity   Alcohol use: No  Alcohol/week: 0.0 standard drinks of alcohol   Drug use: No   Sexual activity: Not Currently  Other Topics Concern   Not on file  Social History Narrative   Usually very active.      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   Social Drivers of Corporate Investment Banker Strain: Low Risk  (01/25/2024)   Overall Financial Resource Strain (CARDIA)    Difficulty of Paying Living Expenses: Not hard at all  Food Insecurity: No Food Insecurity (01/25/2024)   Hunger Vital Sign    Worried About Running Out of Food in the Last Year: Never true    Ran Out of Food in the Last Year: Never true  Transportation Needs: No Transportation Needs (01/25/2024)   PRAPARE - Administrator, Civil Service (Medical): No    Lack of Transportation (Non-Medical): No  Physical Activity: Insufficiently Active (01/25/2024)   Exercise Vital Sign    Days of Exercise per Week: 3 days    Minutes of Exercise per Session: 20 min  Stress: No Stress Concern Present (01/25/2024)   [employer] of Occupational Health - Occupational Stress Questionnaire    Feeling of Stress: Not at all  Social Connections: Socially Integrated (01/25/2024)   Social Connection and Isolation Panel    Frequency of Communication with Friends and Family: More than three times a week    Frequency of Social Gatherings with Friends and Family: More than three times a week    Attends Religious Services: More than 4 times  per year    Active Member of [employer] or Organizations: Yes    Attends Engineer, Structural: More than 4 times per year    Marital Status: Married    Tobacco Counseling Counseling given: Not Answered Tobacco comments: non smoker   Clinical Intake:  Pre-visit preparation completed: Yes  Pain : 0-10 Pain Score: 5  Pain Type: Chronic pain Pain Location: Back Pain Orientation: Lower Pain Descriptors / Indicators: Aching Pain Onset: More than a month ago Pain Frequency: Intermittent Pain Relieving Factors: injections in back Effect of Pain on Daily Activities: no house work:less activity  Pain Relieving Factors: injections in back  BMI - recorded: 25.28 Nutritional Status: BMI 25 -29 Overweight Nutritional Risks: None Diabetes: No  No results found for: HGBA1C   How often do you need to have someone help you when you read instructions, pamphlets, or other written materials from your doctor or pharmacy?: 1 - Never  Interpreter Needed?: No  Comments: lives with husband Information entered by :: Sarah Howdyshell,LPN   Activities of Daily Living     01/25/2024    1:57 PM  In your present state of health, do you have any difficulty performing the following activities:  Hearing? 0  Vision? 0  Difficulty concentrating or making decisions? 0  Walking or climbing stairs? 0  Dressing or bathing? 0  Doing errands, shopping? 0  Preparing Food and eating ? N  Using the Toilet? N  In the past six months, have you accidently leaked urine? N  Do you have problems with loss of bowel control? N  Managing your Medications? N  Managing your Finances? N  Housekeeping or managing your Housekeeping? N    Patient Care Team: Tower, Laine LABOR, MD as PCP - General (Family Medicine) Curvin Deward MOULD, MD as Consulting Physician (General Surgery) Gretta Leita SQUIBB, DO as Consulting Physician (Pulmonary Disease) Lavona Agent, MD as Consulting Physician (Cardiology) Pa, [facility]  Ophthalmology  I  have updated your Care Teams any recent Medical Services you may have received from other providers in the past year.     Assessment:   This is a routine wellness examination for Sarah Young.  Hearing/Vision screen Hearing Screening - Comments:: Patient denies any hearing difficulties.   Vision Screening - Comments:: Pt says their vision is good with glasses [facility]   Goals Addressed             This Visit's Progress    Patient Stated   On track    08/25/23- I will maintain and continue medications as prescribed.        Depression Screen     01/25/2024    1:53 PM 12/05/2023    8:49 AM 01/24/2023   11:42 AM 10/30/2021   11:31 AM 08/18/2021    8:43 AM 08/14/2020    9:07 AM 07/27/2019   10:34 AM  PHQ 2/9 Scores  PHQ - 2 Score 0 0 0 0 0 0 0  PHQ- 9 Score  0     0    Fall Risk     01/25/2024    1:48 PM 12/05/2023    8:49 AM 01/24/2023   11:40 AM 10/30/2021   11:30 AM 08/14/2020    8:34 AM  Fall Risk   Falls in the past year? 0 0 0 0 0  Number falls in past yr: 0 0 0    Injury with Fall? 0 0 0    Risk for fall due to : No Fall Risks No Fall Risks No Fall Risks    Follow up Education provided;Falls prevention discussed Falls evaluation completed Falls prevention discussed Falls evaluation completed       Data saved with a previous flowsheet row definition    MEDICARE RISK AT HOME:  Medicare Risk at Home Any stairs in or around the home?: Yes If so, are there any without handrails?: Yes Home free of loose throw rugs in walkways, pet beds, electrical cords, etc?: Yes Adequate lighting in your home to reduce risk of falls?: Yes Life alert?: No Use of a cane, walker or w/c?: Yes Grab bars in the bathroom?: Yes Shower chair or bench in shower?: Yes Elevated toilet seat or a handicapped toilet?: Yes  TIMED UP AND GO:  Was the test performed?  No  Cognitive Function: 6CIT completed    07/27/2019   10:37 AM  MMSE - Mini Mental State Exam  Orientation  to time 5  Orientation to Place 5  Registration 3  Attention/ Calculation 5  Recall 3  Language- repeat 1        01/25/2024    1:58 PM 01/24/2023   11:43 AM 08/18/2021    8:39 AM  6CIT Screen  What Year? 0 points 0 points 0 points  What month? 0 points 0 points 0 points  What time? 0 points 0 points 0 points  Count back from 20 0 points 0 points 0 points  Months in reverse 0 points 0 points 0 points  Repeat phrase 0 points 0 points 0 points  Total Score 0 points 0 points 0 points    Immunizations Immunization History  Administered Date(s) Administered   Fluad Quad(high Dose 65+) 12/30/2021   INFLUENZA, HIGH DOSE SEASONAL PF 02/06/2018, 01/05/2019, 01/17/2020, 01/23/2021, 01/28/2023   Influenza Whole 12/02/2009   Influenza,inj,Quad PF,6+ Mos 04/20/2013, 03/04/2015, 12/15/2015   Influenza-Unspecified 01/24/2017   PFIZER(Purple Top)SARS-COV-2 Vaccination 05/05/2019, 05/30/2019   Pneumococcal Conjugate-13 03/04/2015   Pneumococcal Polysaccharide-23 05/11/2007  Td 10/23/2003    Screening Tests Health Maintenance  Topic Date Due   Zoster Vaccines- Shingrix (1 of 2) Never done   DTaP/Tdap/Td (2 - Tdap) 06/26/2024 (Originally 10/22/2013)   Influenza Vaccine  06/26/2024 (Originally 10/28/2023)   COVID-19 Vaccine (3 - 2025-26 season) 12/29/2024 (Originally 11/28/2023)   Mammogram  11/20/2024   Medicare Annual Wellness (AWV)  01/24/2025   Pneumococcal Vaccine: 50+ Years  Completed   DEXA SCAN  Completed   Meningococcal B Vaccine  Aged Out    Health Maintenance Items Addressed: Pt will get Influenza at pharmacy or PCP visit   Additional Screening:  Vision Screening: Recommended annual ophthalmology exams for early detection of glaucoma and other disorders of the eye. Is the patient up to date with their annual eye exam?  Yes  Who is the provider or what is the name of the office in which the patient attends annual eye exams? [facility]  Dental Screening: Recommended annual  dental exams for proper oral hygiene  Community Resource Referral / Chronic Care Management: CRR required this visit?  No   CCM required this visit?  No   Plan:    I have personally reviewed and noted the following in the patient's chart:   Medical and social history Use of alcohol, tobacco or illicit drugs  Current medications and supplements including opioid prescriptions. Patient is currently taking opioid prescriptions. Information provided to patient regarding non-opioid alternatives. Patient advised to discuss non-opioid treatment plan with their provider. Functional ability and status Nutritional status Physical activity Advanced directives List of other physicians Hospitalizations, surgeries, and ER visits in previous 12 months Vitals Screenings to include cognitive, depression, and falls Referrals and appointments  In addition, I have reviewed and discussed with patient certain preventive protocols, quality metrics, and best practice recommendations. A written personalized care plan for preventive services as well as general preventive health recommendations were provided to patient.   Erminio LITTIE Saris, LPN   [id number]   After Visit Summary: (Declined) Due to this being a telephonic visit, with patients personalized plan was offered to patient but patient Declined AVS at this time   Notes: Nothing significant to report at this time.

## 2024-01-25 NOTE — Telephone Encounter (Signed)
 I called Sarah Young to discuss her result.  I explained to her that the ultrasound of her legs showed no evidence of DVT.  She had no significant burden of PVC on Holter.  And the echo of her heart showed normal biventricular function.  She states that she is actually feeling better and feels like her symptoms may have been from the COVID.  I will agree with that.  We have an appointment in December where we discussed whether a nuclear stress test is indicated, though I suspect not.   Joelle DEL. Ren Ny, MD [facility]

## 2024-01-25 NOTE — Patient Instructions (Signed)
 Sarah Young,  Thank you for taking the time for your Medicare Wellness Visit. I appreciate your continued commitment to your health goals. Please review the care plan we discussed, and feel free to reach out if I can assist you further.  Medicare recommends these wellness visits once per year to help you and your care team stay ahead of potential health issues. These visits are designed to focus on prevention, allowing your provider to concentrate on managing your acute and chronic conditions during your regular appointments.  Please note that Annual Wellness Visits do not include a physical exam. Some assessments may be limited, especially if the visit was conducted virtually. If needed, we may recommend a separate in-person follow-up with your provider.  Ongoing Care Seeing your primary care provider every 3 to 6 months helps us  monitor your health and provide consistent, personalized care.   Referrals If a referral was made during today's visit and you haven't received any updates within two weeks, please contact the referred provider directly to check on the status.  Recommended Screenings:  Health Maintenance  Topic Date Due   Zoster (Shingles) Vaccine (1 of 2) Never done   DTaP/Tdap/Td vaccine (2 - Tdap) 06/26/2024*   Flu Shot  06/26/2024*   COVID-19 Vaccine (3 - 2025-26 season) 12/29/2024*   Breast Cancer Screening  11/20/2024   Medicare Annual Wellness Visit  01/24/2025   Pneumococcal Vaccine for age over 44  Completed   DEXA scan (bone density measurement)  Completed   Meningitis B Vaccine  Aged Out  *Topic was postponed. The date shown is not the original due date.       12/09/2023   10:38 AM  Advanced Directives  Does Patient Have a Medical Advance Directive? No  Would patient like information on creating a medical advance directive? No - Patient declined   Advance Care Planning is important because it: Ensures you receive medical care that aligns with your values,  goals, and preferences. Provides guidance to your family and loved ones, reducing the emotional burden of decision-making during critical moments.  Vision: Annual vision screenings are recommended for early detection of glaucoma, cataracts, and diabetic retinopathy. These exams can also reveal signs of chronic conditions such as diabetes and high blood pressure.  Dental: Annual dental screenings help detect early signs of oral cancer, gum disease, and other conditions linked to overall health, including heart disease and diabetes.  Please see the attached documents for additional preventive care recommendations.

## 2024-01-27 DIAGNOSIS — I493 Ventricular premature depolarization: Secondary | ICD-10-CM | POA: Diagnosis not present

## 2024-02-02 ENCOUNTER — Encounter (HOSPITAL_COMMUNITY)

## 2024-02-02 ENCOUNTER — Other Ambulatory Visit (HOSPITAL_COMMUNITY)

## 2024-03-02 ENCOUNTER — Other Ambulatory Visit

## 2024-03-02 NOTE — Progress Notes (Signed)
       Cardiology Office Note Date:  03/05/2024  ID:  Lonia Roane, DOB May 29, 1940, MRN [id number] PCP:  Randeen Laine LABOR, MD  Cardiologist:   Joelle VEAR Ren Donley, MD  No chief complaint on file.   Problems CP following recent COVID infection 9/25 TTE 10/25 60-65%, mild LVH DVT/PE (following COVID in 2021) on Xarelto , recent CTA 11/2023 negative HTN/HLD on AE Aortic atherosclerosis   Visits  12/2023: DVT U/S negative, Zio patch without significant PVCs, TTE, RN10 --> plan for NST if remains symptomatic 10/25 ED for dyspnea: CTPE w/o PE --> PCP ordered PFTs  History of Present Illness: Sarah Young is a 83 y.o. female who presents for follow up. I have spoken to her over the phone in late October and patient reported some improvement in her dyspnea.  Today, she does feel better but still feels very weak and fatigued.  She reports feeling fatigued with activity such as taking a shower and dressing herself.  She has had to stop working since the last visit due to worsening fatigue and general weakness.  She reports some intermittent chest tightness with associated dyspnea at rest.  She feels like her functional capacity has decreased significantly over the last year or so.  She is not able to do even one third of the stuff she is to be able to do.  She does not check her blood pressure at home but reports adherence to her blood pressure medication.  ROS: Otherwise negative  Physical Exam VS:  BP (!) 148/98   Pulse 80   Ht 5' 3.5 (1.613 m)   Wt 145 lb (65.8 kg)   SpO2 96%   BMI 25.28 kg/m  , BMI Body mass index is 25.28 kg/m. GEN: Well nourished, well developed, in no acute distress HEENT: normal Neck: no JVD, carotid bruits, or masses Cardiac: RRR; no murmurs, rubs, or gallops,no edema  Respiratory:  CTAB bilaterally, normal work of breathing GI: soft, nontender, nondistended, + BS Extremities: No LE edema Skin: warm and dry, no rash Neuro:  Strength and  sensation are intact  Recent Labs: Reviewed  ASSESSMENT AND PLAN Sarah Young is a 83 y.o. female who presents for follow up.  - She had presented with worsening dyspnea since her COVID diagnosis back in September.  We had obtain a 2D echo that showed normal EF.  She reports improvement in symptoms but still feels very fatigued.  I do not suspect this is in the setting of obstructive CAD, but will obtain a nuclear stress test to complete the workup.  I explained that I think she should talk to her PCP about starting outpatient rehab given significant decline in function.  I also agree with PFTs per her PCP. - I recommended that she checks her blood pressure and provided her with a blood pressure log. - We will obtain lipid panel and adjust statin as needed.    Signed, Joelle VEAR Ren Donley, MD  03/05/2024 11:04 AM    [facility]

## 2024-03-05 ENCOUNTER — Ambulatory Visit

## 2024-03-05 VITALS — BP 148/98 | HR 80 | Ht 63.5 in | Wt 145.0 lb

## 2024-03-05 DIAGNOSIS — R0602 Shortness of breath: Secondary | ICD-10-CM | POA: Diagnosis not present

## 2024-03-05 DIAGNOSIS — I10 Essential (primary) hypertension: Secondary | ICD-10-CM | POA: Diagnosis not present

## 2024-03-05 DIAGNOSIS — Z86711 Personal history of pulmonary embolism: Secondary | ICD-10-CM

## 2024-03-05 DIAGNOSIS — I700 Atherosclerosis of aorta: Secondary | ICD-10-CM | POA: Diagnosis not present

## 2024-03-05 DIAGNOSIS — E785 Hyperlipidemia, unspecified: Secondary | ICD-10-CM

## 2024-03-05 LAB — LIPID PANEL
Chol/HDL Ratio: 2.2 ratio (ref 0.0–4.4)
Cholesterol, Total: 179 mg/dL (ref 100–199)
HDL: 82 mg/dL (ref >39)
LDL Chol Calc (NIH): 82 mg/dL (ref 0–99)
Triglycerides: 83 mg/dL (ref 0–149)
VLDL Cholesterol Cal: 15 mg/dL (ref 5–40)

## 2024-03-05 NOTE — Patient Instructions (Signed)
 Medication Instructions:  Your physician recommends that you continue on your current medications as directed. Please refer to the Current Medication list given to you today. *If you need a refill on your cardiac medications before your next appointment, please call your pharmacy*  Lab Work: TODAY-LIPIDS If you have labs (blood work) drawn today and your tests are completely normal, you will receive your results only by: MyChart Message (if you have MyChart) OR A paper copy in the mail If you have any lab test that is abnormal or we need to change your treatment, we will call you to review the results.  Testing/Procedures: Your physician has requested that you have an exercise stress myoview . For further information please visit [email or web address] Please follow instruction sheet, as given.  Follow-Up: At [facility], you and your health needs are our priority.  As part of our continuing mission to provide you with exceptional heart care, our providers are all part of one team.  This team includes your primary Cardiologist (physician) and Advanced Practice Providers or APPs (Physician Assistants and Nurse Practitioners) who all work together to provide you with the care you need, when you need it.  Your next appointment:   6 month(s)  Provider:   ANY APP      Then, Joelle VEAR Ren Donley, MD will plan to see you again in 6 month(s).    We recommend signing up for the patient portal called MyChart.  Sign up information is provided on this After Visit Summary.  MyChart is used to connect with patients for Virtual Visits (Telemedicine).  Patients are able to view lab/test results, encounter notes, upcoming appointments, etc.  Non-urgent messages can be sent to your provider as well.   To learn more about what you can do with MyChart, go to [email or web address].   Other Instructions

## 2024-03-06 ENCOUNTER — Ambulatory Visit: Payer: Self-pay

## 2024-03-08 ENCOUNTER — Telehealth (HOSPITAL_COMMUNITY): Payer: Self-pay | Admitting: *Deleted

## 2024-03-08 NOTE — Telephone Encounter (Signed)
 Pt given instructions for stress test.

## 2024-03-12 ENCOUNTER — Other Ambulatory Visit: Payer: Self-pay

## 2024-03-12 DIAGNOSIS — I700 Atherosclerosis of aorta: Secondary | ICD-10-CM

## 2024-03-12 DIAGNOSIS — Z86711 Personal history of pulmonary embolism: Secondary | ICD-10-CM

## 2024-03-12 DIAGNOSIS — E785 Hyperlipidemia, unspecified: Secondary | ICD-10-CM

## 2024-03-12 DIAGNOSIS — R0602 Shortness of breath: Secondary | ICD-10-CM

## 2024-03-12 DIAGNOSIS — I10 Essential (primary) hypertension: Secondary | ICD-10-CM

## 2024-03-15 ENCOUNTER — Inpatient Hospital Stay (HOSPITAL_COMMUNITY)
Admission: RE | Admit: 2024-03-15 | Discharge: 2024-03-15 | Attending: Cardiovascular Disease | Admitting: Cardiovascular Disease

## 2024-03-15 DIAGNOSIS — Z86711 Personal history of pulmonary embolism: Secondary | ICD-10-CM | POA: Diagnosis present

## 2024-03-15 DIAGNOSIS — I10 Essential (primary) hypertension: Secondary | ICD-10-CM | POA: Insufficient documentation

## 2024-03-15 DIAGNOSIS — R0602 Shortness of breath: Secondary | ICD-10-CM | POA: Diagnosis present

## 2024-03-15 DIAGNOSIS — E785 Hyperlipidemia, unspecified: Secondary | ICD-10-CM | POA: Diagnosis present

## 2024-03-15 DIAGNOSIS — I700 Atherosclerosis of aorta: Secondary | ICD-10-CM | POA: Insufficient documentation

## 2024-03-15 LAB — MYOCARDIAL PERFUSION IMAGING
LV dias vol: 57 mL (ref 46–106)
LV sys vol: 15 mL (ref 3.8–5.2)
Nuc Stress EF: 74 %
Peak HR: 104 {beats}/min
Rest HR: 80 {beats}/min
Rest Nuclear Isotope Dose: 10.1 mCi
SDS: 0
SRS: 0
SSS: 0
ST Depression (mm): 0 mm
Stress Nuclear Isotope Dose: 31.6 mCi
TID: 1.09

## 2024-03-15 MED ORDER — TECHNETIUM TC 99M TETROFOSMIN IV KIT
10.1000 | PACK | Freq: Once | INTRAVENOUS | Status: AC | PRN
  Administered 2024-03-15: 10:00:00 10.1 via INTRAVENOUS

## 2024-03-15 MED ORDER — REGADENOSON 0.4 MG/5ML IV SOLN
0.4000 mg | Freq: Once | INTRAVENOUS | Status: AC
  Administered 2024-03-15: 12:00:00 0.4 mg via INTRAVENOUS

## 2024-03-15 MED ORDER — REGADENOSON 0.4 MG/5ML IV SOLN
INTRAVENOUS | Status: AC
  Filled 2024-03-15: qty 5

## 2024-03-15 MED ORDER — TECHNETIUM TC 99M TETROFOSMIN IV KIT
31.6000 | PACK | Freq: Once | INTRAVENOUS | Status: AC | PRN
  Administered 2024-03-15: 12:00:00 31.6 via INTRAVENOUS

## 2024-03-16 NOTE — Telephone Encounter (Signed)
"   I called Sarah Young to discuss the results of her stress test. The test was negative for inducible ischemia. She continues to endorse significant fatigue and inability to do things such as picking up her grandkids. She wonders if she may have long COVID syndrome since her symptoms started after her diagnosis. I offered to refer her to physical medicine specialists and she agreed to it.  Joelle DEL. Ren Ny, MD [facility]  "

## 2025-01-29 ENCOUNTER — Ambulatory Visit
